# Patient Record
Sex: Male | Born: 1935
Health system: Southern US, Community
[De-identification: ages and names within clinical notes are randomized; demographics above are authoritative.]

## PROBLEM LIST (undated history)

## (undated) DIAGNOSIS — I2510 Atherosclerotic heart disease of native coronary artery without angina pectoris: Secondary | ICD-10-CM

## (undated) DIAGNOSIS — I350 Nonrheumatic aortic (valve) stenosis: Secondary | ICD-10-CM

## (undated) DIAGNOSIS — G459 Transient cerebral ischemic attack, unspecified: Secondary | ICD-10-CM

## (undated) DIAGNOSIS — I10 Essential (primary) hypertension: Secondary | ICD-10-CM

## (undated) DIAGNOSIS — C801 Malignant (primary) neoplasm, unspecified: Secondary | ICD-10-CM

## (undated) DIAGNOSIS — I6502 Occlusion and stenosis of left vertebral artery: Secondary | ICD-10-CM

## (undated) DIAGNOSIS — M48061 Spinal stenosis, lumbar region without neurogenic claudication: Secondary | ICD-10-CM

## (undated) DIAGNOSIS — I739 Peripheral vascular disease, unspecified: Secondary | ICD-10-CM

## (undated) DIAGNOSIS — I38 Endocarditis, valve unspecified: Secondary | ICD-10-CM

## (undated) DIAGNOSIS — E785 Hyperlipidemia, unspecified: Secondary | ICD-10-CM

## (undated) DIAGNOSIS — Z8679 Personal history of other diseases of the circulatory system: Secondary | ICD-10-CM

## (undated) DIAGNOSIS — M4646 Discitis, unspecified, lumbar region: Secondary | ICD-10-CM

## (undated) DIAGNOSIS — Z9079 Acquired absence of other genital organ(s): Secondary | ICD-10-CM

## (undated) DIAGNOSIS — B952 Enterococcus as the cause of diseases classified elsewhere: Secondary | ICD-10-CM

## (undated) DIAGNOSIS — I779 Disorder of arteries and arterioles, unspecified: Secondary | ICD-10-CM

## (undated) HISTORY — PX: CARDIAC CATHETERIZATION: SHX172

## (undated) HISTORY — DX: Nonrheumatic aortic (valve) stenosis: I35.0

## (undated) HISTORY — DX: Atherosclerotic heart disease of native coronary artery without angina pectoris: I25.10

## (undated) HISTORY — DX: Personal history of other diseases of the circulatory system: Z86.79

## (undated) HISTORY — DX: Hyperlipidemia, unspecified: E78.5

## (undated) HISTORY — DX: Peripheral vascular disease, unspecified: I73.9

## (undated) HISTORY — DX: Acquired absence of other genital organ(s): Z90.79

## (undated) HISTORY — DX: Essential (primary) hypertension: I10

---
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
## 1985-06-19 HISTORY — PX: CORONARY ARTERY BYPASS GRAFT: SHX141

## 1997-12-07 ENCOUNTER — Ambulatory Visit (HOSPITAL_COMMUNITY): Admission: RE | Admit: 1997-12-07 | Discharge: 1997-12-07 | Payer: Self-pay | Admitting: Urology

## 2002-02-05 ENCOUNTER — Encounter (INDEPENDENT_AMBULATORY_CARE_PROVIDER_SITE_OTHER): Payer: Self-pay | Admitting: *Deleted

## 2002-02-05 ENCOUNTER — Ambulatory Visit (HOSPITAL_COMMUNITY): Admission: RE | Admit: 2002-02-05 | Discharge: 2002-02-05 | Payer: Self-pay | Admitting: *Deleted

## 2003-04-23 ENCOUNTER — Emergency Department (HOSPITAL_COMMUNITY): Admission: EM | Admit: 2003-04-23 | Discharge: 2003-04-23 | Payer: Self-pay | Admitting: Emergency Medicine

## 2003-11-11 ENCOUNTER — Ambulatory Visit (HOSPITAL_COMMUNITY): Admission: RE | Admit: 2003-11-11 | Discharge: 2003-11-12 | Payer: Self-pay | Admitting: Cardiology

## 2004-05-03 ENCOUNTER — Ambulatory Visit (HOSPITAL_COMMUNITY): Admission: RE | Admit: 2004-05-03 | Discharge: 2004-05-03 | Payer: Self-pay | Admitting: Ophthalmology

## 2004-06-19 HISTORY — PX: CARDIAC VALVE REPLACEMENT: SHX585

## 2004-08-05 ENCOUNTER — Ambulatory Visit (HOSPITAL_COMMUNITY): Admission: RE | Admit: 2004-08-05 | Discharge: 2004-08-05 | Payer: Self-pay | Admitting: Cardiology

## 2004-08-15 ENCOUNTER — Inpatient Hospital Stay (HOSPITAL_COMMUNITY): Admission: RE | Admit: 2004-08-15 | Discharge: 2004-08-20 | Payer: Self-pay | Admitting: Cardiothoracic Surgery

## 2004-08-15 ENCOUNTER — Encounter (INDEPENDENT_AMBULATORY_CARE_PROVIDER_SITE_OTHER): Payer: Self-pay | Admitting: *Deleted

## 2004-09-12 ENCOUNTER — Encounter: Admission: RE | Admit: 2004-09-12 | Discharge: 2004-09-12 | Payer: Self-pay | Admitting: Cardiothoracic Surgery

## 2004-09-22 ENCOUNTER — Encounter (HOSPITAL_COMMUNITY): Admission: RE | Admit: 2004-09-22 | Discharge: 2004-12-21 | Payer: Self-pay | Admitting: Cardiology

## 2004-12-22 ENCOUNTER — Encounter (HOSPITAL_COMMUNITY): Admission: RE | Admit: 2004-12-22 | Discharge: 2005-01-17 | Payer: Self-pay | Admitting: Cardiology

## 2005-04-05 ENCOUNTER — Ambulatory Visit (HOSPITAL_COMMUNITY): Admission: RE | Admit: 2005-04-05 | Discharge: 2005-04-05 | Payer: Self-pay | Admitting: *Deleted

## 2005-04-05 ENCOUNTER — Encounter (INDEPENDENT_AMBULATORY_CARE_PROVIDER_SITE_OTHER): Payer: Self-pay | Admitting: *Deleted

## 2010-04-07 ENCOUNTER — Ambulatory Visit: Payer: Self-pay | Admitting: Cardiology

## 2010-11-04 NOTE — H&P (Signed)
NAMENAYEF, COLLEGE NO.:  [id number]   MEDICAL RECORD NO.:  [id number]          PATIENT TYPE:  OIB   LOCATION:  NA                           FACILITY:  [facility]   PHYSICIAN:  Guadelupe Sabin, M.D.DATE OF BIRTH:  1927/07/31   DATE OF ADMISSION:  05/03/2004  DATE OF DISCHARGE:                                HISTORY & PHYSICAL   REASON FOR ADMISSION:  This was a planned outpatient surgical admission of  this 77 year old white male admitted for cataract implant surgery of the  right eye.   PRESENT ILLNESS:  This patient has been followed in my office for routine  eye care since April 10, 1990 when he was referred by Dr. Ronny Flurry,  his cardiologist.  Initial examination revealed a refractive change and  acute posterior vitreous detachment.  Vision was corrected to 20/30 right  eye, 20/40 left eye.  The patient subsequently has had routine eye care with  refraction changes and minor injuries.  By 2001, the patient was noted to  have anterior subcapsular and nuclear cataract change, greater in the right  than left eye.  This has progressed, and by September 2005, vision was  recorded with best correction and/or refraction at 20/40 right eye, 20/25  left eye.  Due to the patient's complaint of increasing blur and glare, it  was elected to proceed with surgery at this time.   PAST MEDICAL HISTORY:  The patient is under the care of Dr. Ronny Flurry  for aortic stenosis, and has had a recent stent in March of 2005, and  started on Plavix.  He also is under the care of Dr. Vonita Moss, and has been  taking aspirin 81 mg, Lipitor, Lupron injections, multivitamin tablets, and  Plavix.   He has been approved by Dr. Vonita Moss for the proposed surgery under local  anesthesia.  Dr. Patty Sermons also stated that he was a fair surgical risk for  the procedure.  Dr. Patty Sermons noted the high-grade coronary artery disease,  aortic stenosis.   REVIEW OF SYSTEMS:  No current  cardiorespiratory complaints.   PHYSICAL EXAMINATION:  VITAL SIGNS:  As recorded on admission.  Blood  pressure 139/71, pulse 59, temperature 98.2, respirations 16.  GENERAL APPEARANCE:  The patient is a pleasant, well-nourished, well-  developed 77 year old white male in no acute distress.  HEENT:  Eyes - visual acuity as noted above.  Applanation tonometry normal;  16 mm right eye, 14 left eye.  Slit lamp examination - the eyes are white  and clear with a clear cornea, deep and clear anterior chamber.  Nuclear and  anterior subcapsular cataract changes present in the right eye, nuclear  cataract change in the left eye.  Dilated detailed fundus examination  reveals a clear vitreous behind the cataract haze with normal optic nerve,  blood vessels, and macula.  CHEST:  Coronary artery bypass surgical scar.  HEART:  Normal sinus rhythm.  The patient has a loud systolic murmur along  the left sternal border.  ABDOMEN:  Negative.  EXTREMITIES:  Negative.   ADMISSION DIAGNOSIS:  Senile cataract, both eyes.   SURGICAL  PLAN:  Cataract implant surgery right eye now, left eye later.       HNJ/MEDQ  D:  05/03/2004  T:  05/03/2004  Job:  [id number]

## 2010-11-04 NOTE — Cardiovascular Report (Signed)
NAME:  Randy Chen, Randy Chen                       ACCOUNT NO.:  [id number]   MEDICAL RECORD NO.:  [id number]                   PATIENT TYPE:  OIB   LOCATION:  6523                                 FACILITY:  [facility]   PHYSICIAN:  Colleen Can. Deborah Chalk, M.D.            DATE OF BIRTH:  04/23/1928   DATE OF PROCEDURE:  11/11/2003  DATE OF DISCHARGE:  11/12/2003                              CARDIAC CATHETERIZATION   HISTORY:  Mr. Casto has a known history of severe aortic stenosis.  He had  previous coronary artery bypass grafting.  He is now referred for  catheterization after having stress Cardiolite study which showed inferior  septal ischemia.  Noninvasive studies showed severe aortic stenosis.   PROCEDURE:  Right and left heart catheterization with selective coronary  angiography, left ventricular angiography, angiography of the saphenous vein  graft to the right coronary artery, saphenous vein graft to the left  circumflex, angiography of the left internal mammary artery to the LAD with  stent placement and angioplasty of the ostium of the left anterior  descending.   TYPE AND SITE OF ENTRY:  Percutaneous, right femoral artery.  Percutaneous  right femoral vein (for right heart catheterization).   CONTRAST MATERIAL:  Omnipaque.   MEDICATIONS GIVEN PRIOR TO PROCEDURE:  Valium 10 mg p.o.   MEDICATIONS GIVEN DURING THE PROCEDURE:  Versed 2 mg IV, Ancef 1 g IV,  Labetalol 10 mg x3 doses, sublingual nitroglycerin, heparin and integrilin.   COMMENTS:  The patient tolerated the procedure well.   HEMODYNAMIC DATA:  1. The right atrial pressure A wave of 6, V wave of 6, mean of 5.  2. RV is 30/8-10.  3. PA is 31/12-20.  4. Pulmonary capillary shows A wave of 21, V wave of 20, mean 18.  5. Aortic pressure 116/57.  6. LV 112/9-15.  7. Aortic valve gradient showed mean of 55.  The aortic saturation 94% , PA     saturation 63%.  8. Fick cardiac output was 3.3 with cardiac index of 2.1.  9.  Thermodilution cardiac output was 4.5 with cardiac index of 2.2.     Calculated aortic valve area was 0.86 sq cm.   ANGIOGRAPHIC DATA:  1. The left main coronary artery was essentially normal.  2. The ostium of the left anterior descending had 95% focal stenosis.  There     was diffuse disease with calcification involving the proximal left     anterior descending.  There were at least 2-3 septal perforating branches     there.  There were two small diagonal vessels, both with 70-80% stenosis,     then there was diffuse disease in the mid left anterior descending.     There was competitive flow distally beyond severe stenosis in the left     anterior descending.  3. Left internal mammary graft to the LAD is widely patent with nice     insertion and good  distal run off.  4. Left circumflex totally occluded proximally.  5. Saphenous vein graft in a sequential manner to the obtuse marginal one,     obtuse marginal two and obtuse marginal three was widely patent with nice     insertion and good distal run off.  6. Right coronary artery:  The right coronary artery was totally occluded     proximally.  7. Saphenous vein graft to the right coronary artery is widely patent with     nice insertion and good distal run off.   LEFT VENTRICULAR ANGIOGRAM:  Left ventricular angiogram was performed in  RAO position.  Overall cardiac size and silhouette are normal.  Global  ejection fraction is estimated to be 65-70%.  There is no mitral  regurgitation noted.  Aortic valve was severely calcified.   PERCUTANEOUS INTERVENTION:  Using a JL-4 guide, we had satisfactory backup.  Hi-Torque Floppy guide wire was passed across the stenosis.  We dilated  initially with a 2.5 x 15-mm Maverick balloon.  We then returned with a 2.5  x 13-mm Cypher stent and positioned and inflated to maximum of 15  atmospheres.  Final angiographic result was felt to be quite satisfactory.  There was some indentation of the stent  superiorly, but this was a region of  calcification, but we had full luminal expansion of the stent and it is felt  to be satisfactory overall.  There is satisfactory outflow although the  distal vessel to the stent was narrowed.  However, outflow was felt to be  excellent and felt to be very satisfactory result.   OVERALL IMPRESSION:  1. Severe aortic stenosis.  2. Severe ostial stenosis in the left anterior descending with compromise of     septal perforating branches proximally with successful stent placement.  3. Patent saphenous vein graft to the right coronary artery, patent left     internal mammary artery graft to the left anterior descending and patent     saphenous vein graft in sequential manner to the left circumflex     distribution.  4. Normal left ventricular function.   PLAN:  The patient will be treated conservatively for the next four to six  months.  He would be a candidate for aortic valve replacement at that point  in time.  It is hopeful that this will address the ischemic area as  demonstrated by stress Cardiolite study.                                               Colleen Can. Deborah Chalk, M.D.    SNT/MEDQ  D:  11/11/2003  T:  11/12/2003  Job:  [id number]   cc:   Cassell Clement, M.D.  [address], [address]  [place]  [place] [address]  Fax: [phone]

## 2010-11-04 NOTE — Discharge Summary (Signed)
NAMEKENZEL, Randy Chen NO.:  [id number]   MEDICAL RECORD NO.:  [id number]          PATIENT TYPE:  INP   LOCATION:  2006                         FACILITY:  [facility]   PHYSICIAN:  Sheliah Plane, MD    DATE OF BIRTH:  05-Jan-1928   DATE OF ADMISSION:  08/15/2004  DATE OF DISCHARGE:  08/19/2004                                 DISCHARGE SUMMARY   ADMISSION DIAGNOSIS:  Aortic stenosis.   DISCHARGE DIAGNOSIS:  1.  History of atherosclerotic coronary artery disease with preserved left      ventricular function status post coronary artery bypass grafting x 5 in      1987 and status post stent placement in 2005.  2.  History of prostate cancer currently maintained on Lupron with a low      PSA.  3.  Status post prostatectomy in 1992 by Dr. Larey Dresser.  4.  History of hematuria.  5.  History of colonic polyps by colonoscopy performed in 2003.  6.  Gastroesophageal reflux.  7.  Aortic stenosis status post aortic valve replacement with a pericardial      tissue valve 23 mm.  8.  Postoperative anemia, stable.  9.  Postoperative atrial fibrillation, resolved on Amiodarone and Lopressor.   ALLERGIES:  No known drug allergies.   BRIEF HISTORY:  The patient is a 77 year old Caucasian male with known  coronary occlusive disease status post CABG in May 1987 and stent placement  in the ostium of the LAD in May 2005 secondary to a jeopardized septal  perforator.  Over the past year, the past year, the patient has noted  occasional symptoms of occasional light headedness, episodes of mild chest  discomfort, and dyspnea on exertion.  The patient denied frank syncope.  He  had been followed by echocardiogram over the past 4-5 years.  The most  recent echo was performed in January 2006 and this showed an increase in the  peak aortic valve gradient.  It also revealed mild mitral regurgitation and  mild tricuspid regurgitation as well as pulmonary  hypertension.  Due to the  increase in the patient's symptoms, he was referred to Dr. Sheliah Plane  regarding aortic valve replacement.  Dr. Tyrone Sage evaluated the patient and  it was his opinion that he should proceed with an aortic valve replacement  with a tissue valve.   HOSPITAL COURSE:  The patient was admitted and taken to the OR on August 15, 2004, for a redo sternotomy and aortic valve replacement for the  pericardial tissue valve, 23 mm.  The patient tolerated the procedure well  and was hemodynamically stable immediately postoperatively.  The patient was  transferred from the OR to the SICU in stable condition.  The patient was  extubated without complications and woke up from anesthesia neurologically  intact.   On postoperative day one, the patient was without complaint.  He was  maintaining a normal sinus rhythm and his vital signs were stable.  Chest x-  ray revealed a 15% left stable pneumothorax.  The patient's drips had been  weaned accordingly.  Chest tubes and vasodilators  were continued in a  routine manner without complications.  Initially, the patient was going to  be restarted on his Plavix and aspirin for the pericardial tissue valve and  stent.  This was then held because of a postoperative thrombocytopenia.  This was stable at that time.  In the evening of postoperative day one, the  patient was noted to have a dark colored urine.  The patient does have a  history of hematuria.  Urine culture and urinalysis were checked and these  were negative.  The patient was also noted to have a postoperative anemia on  postop day two with hemoglobin 9.5 and hematocrit 27.3.  The patient was  started on iron and folic acid.  He was also noted to have a short episode  of supraventricular tachycardia with a rate up to 160s after his walk.  Upon  resting, his heart rate came back down to 110 to 115 and he was given his  morning dose of beta blocker.  The patient returned to her sinus rhythm and   stabilized.  The patient began cardiac rehab on postoperative day one and  had tolerated this quite well throughout the postoperative course.   On the evening of postoperative day two, he was noted to go into atrial  fibrillation.  The patient was started on IV Amiodarone and his beta blocker  dose was increased.  The patient subsequently converted to a normal sinus  rhythm and was switched to  p.o. Amiodarone.  The patient has continued to  maintain a normal sinus rhythm throughout the remainder of the postoperative  course.  He will be discharged on Amiodarone and Toprol.  Secondary to the  patient's thrombocytopenia and his atrial fibrillation, the patient was  started on Coumadin.  He will be maintained on this for approximately two  months pending that he has no further episodes of atrial fibrillation.  The  patient has been volume overloaded postoperatively and has been diuresed  accordingly.  There were no further episodes of hematuria noted throughout  the postoperative course.   On postoperative day four, the patient is without complaint.  He is afebrile  with stable vital signs and  maintaining a normal sinus rhythm. On physical  exam, cardiac is regular rate and rhythm, there are faint crackles present  in the bilateral bases of the lungs.  The abdomen is benign.  The incision  is clean, dry, and intact.  There is no edema present in the bilateral lower  extremities.  The patient is maintaining  normal sinus rhythm on p.o.  Amiodarone, Coumadin, and Lopressor.  He will need to continue diuresis for  a short period of time after discharge.  He will also be continued on iron  and folic acid for his postoperative anemia.  The postoperative  thrombocytopenia is stable at this time.  As long as the patient continues  to progress in the current manner, he should be stable for discharge in the next 1-2 days pending morning round rehabilitation.   LABORATORY DATA:  CBC on August 19, 2004, showed white count 7.9, hemoglobin  8.9, hematocrit 25.4, platelets 138.  BNP on August 19, 2004, sodium 141,  potassium 4.3, BUN 13, creatinine 1.1, glucose 115, INR 1.2 on August 19, 2004.  Urine culture was negative.   CONDITION ON DISCHARGE:  Improved.   DISCHARGE MEDICATIONS:  Aspirin 81 mg p.o. daily, Toprol XL 50 mg daily,  Altace 5 mg daily, folic acid 1 mg daily,  Niferex 150 mg daily, Amiodarone  200 mg b.i.d. then as directed, Coumadin 2.5 mg daily, and as directed,  Lasix 40 mg daily x five additional days, K-Dur 20 mEq daily x 5 additional  days, Tylox 1-2 p.o. q.4-6h. p.r.n. pain.   DISCHARGE INSTRUCTIONS:  Activities:  No driving and no lifting more than 10  pounds and the patient is to continue daily breathing and walking exercises.  Diet:  Low salt, low fat.  Wound care:  The patient may shower daily and  clean the incisions with soap and water.  The patient is instructed to call  the office if wound problems arise.  Follow up:  PT and INR blood work on  August 22, 2004, by Dr. Yevonne Pax office.  The patient will be instructed to  contact his office for an appointment.  The patient should contact Dr.  Yevonne Pax office for an  appointment two weeks after discharge.  Dr. Tyrone Sage September 15, 2004, at  12:10 p.m.  The patient should go to [facility] on September 15, 2004, at 11:10 a.m. for a chest x-ray which he will bring with him to  the appointment with Dr. Tyrone Sage.      AY/MEDQ  D:  08/19/2004  T:  08/19/2004  Job:  [id number]   cc:   Cassell Clement, M.D.  [address]  [place]  [place] [address]  Fax: [phone]

## 2010-11-04 NOTE — H&P (Signed)
NAME:  Randy Chen, Randy Chen                       ACCOUNT NO.:  [id number]   MEDICAL RECORD NO.:  [id number]                   PATIENT TYPE:  OIB   LOCATION:                                       FACILITY:  [facility]   PHYSICIAN:  Colleen Can. Deborah Chalk, M.D.            DATE OF BIRTH:  May 17, 1928   DATE OF ADMISSION:  11/11/2003  DATE OF DISCHARGE:                                HISTORY & PHYSICAL   CHIEF COMPLAINT:  None.   HISTORY OF PRESENT ILLNESS:  Randy Chen is a pleasant 77 year old male who  was referred to our office for cardiac catheterization.  He sees Dr. Cassell Clement on a routine basis.  He was referred for his routine stress  Cardiolite study, earlier this month.  Clinically, he has been doing well.  He has had no complaints of chest pain or shortness of breath.  He has had  no lightheadedness, dizziness, or syncope.  He underwent a stress Cardiolite  study on Nov 04, 2003.  He walked seven minutes and 15 seconds on the  standard Bruce protocol.  He had no chest pain or shortness of breath.  His  EKG showed marked ST depression in the inferior lateral leads.  There was  possible septal ischemia with mildly depressed LV dysfunction with septal  hypokinesis.  Invasive study is now recommended.   PAST MEDICAL HISTORY:  1. Atherosclerotic cardiovascular disease.  He has had previous coronary     artery bypass grafting that dates back to 1987.  He had left internal     mammary at that time placed to the LAD, saphenous vein grafts were placed     to the distal right coronary, to the first, second, and third OM branches     of the circumflex.  2. Known aortic stenosis with last 2D echocardiogram, in January 2005, with     this he demonstrated mild LVH, normal LV systolic function, calcified     aortic stenosis with severe aortic stenosis and mild aortic     insufficiency.  He has had no significant change in his peak systolic     gradient or peak velocity.  3. History of  prostatectomy.  4. Seasonal allergies.  5. Hyperlipidemia.   ALLERGIES:  None.   CURRENT MEDICINES:  1. Lipitor 40 every day.  2. Baby aspirin daily.  3. Lupron every four months.  4. Tums twice a day.  5. Folic acid daily.  6. Multivitamin daily.  7. Allegra p.r.n.   FAMILY HISTORY:  His brother has had previous prostate cancer.  Both of his  parents are deceased.  Father died at 28 with a history of heart disease.  Mother died at 45 with heart disease and heart failure.   SOCIAL HISTORY:  He previously was employed in [employer], clinical.  He is married.  He has no tobacco products.  He does have three children.   REVIEW OF SYSTEMS:  As noted above and is otherwise unremarkable.   PHYSICAL EXAMINATION:  GENERAL:  He is a pleasant male in no acute distress.  VITAL SIGNS:  Weight is 194, blood pressure 150/90 sitting, 140/80 standing,  heart rate 64 and regular, respirations 18.  He is afebrile.  SKIN:  Warm and dry.  Color is unremarkable.  LUNGS:  Are both clear.  HEART:  Shows a grade 3/6 harsh systolic murmur.  ABDOMEN:  Soft.  Positive bowel sounds.  EXTREMITIES:  Without edema.   PERTINENT LABS:  Pending.   OVERALL IMPRESSION:  1. Abnormal stress Cardiolite study.  2. Known ischemic heart disease with previous coronary artery bypass     grafting dating back to 66.  3. Known aortic stenosis.   PLAN:  We will proceed on with left and right heart catheterization.  The  procedure has been reviewed in full detail including the risks and benefits,  and he is willing to proceed on Wednesday, Nov 11, 2003.      Sharlee Blew, N.P.                     Colleen Can. Deborah Chalk, M.D.    LC/MEDQ  D:  11/09/2003  T:  11/10/2003  Job:  [id number]   cc:   Cassell Clement, M.D.  [address]  [place]  [place] [address]  Fax: [phone]   Sharlet Salina, M.D.  [address]  [place]  [place] [address]  Fax: [phone]

## 2010-11-04 NOTE — Op Note (Signed)
NAMERAMOND, DARNELL NO.:  [id number]   MEDICAL RECORD NO.:  [id number]          PATIENT TYPE:  OIB   LOCATION:  2304                         FACILITY:  [facility]   PHYSICIAN:  Sheliah Plane, MD    DATE OF BIRTH:  [birth year]   DATE OF PROCEDURE:  08/16/2004  DATE OF DISCHARGE:                                 OPERATIVE REPORT   PREOPERATIVE DIAGNOSIS:  Critical aortic stenosis status post coronary  artery bypass grafting.   POSTOPERATIVE DIAGNOSIS:  Critical aortic stenosis status post coronary  artery bypass graft.   PROCEDURE:  Redo sternotomy with aortic valve replacement with a pericardial  tissue valve, model 3000, [employer], 23 mm.   SURGEON:  Gwenith Daily. Tyrone Sage, M.D.   FIRST ASSISTANT:  Evelene Croon, M.D.   ANESTHESIA:  General.   INDICATIONS FOR PROCEDURE:  The patient is a 77 year old male who had known  coronary occlusive disease, having undergone coronary artery bypass grafting  x5 in 1988. At that time, he had a triple, sequential reverse saphenous vein  graft to a lst, 2nd and 3rd obtuse marginal, left anterior descending  coronary artery to the left anterior descending artery and reverse saphenous  vein graft to the distal right coronary artery.   The patient had done well until the last several years when he was noted to  have progressive aortic stenosis to the point where he now has over a 100 mm  peak gradient.  Cardiac catheterization was performed in the past several  weeks that confirmed patency of his grafts without evidence of disease, and  severe native coronary artery disease, and critical aortic stenosis.  Because of the patient's symptoms of vague chest discomfort and increasing  fatigue, redo surgery with aortic valve replacement was recommended to the  patient, who agreed to proceed with aortic valve replacement and signed  informed consent.   DESCRIPTION OF PROCEDURE:  With Swan-Ganz, arterial lines and monitors  in  place, the patient underwent general endotracheal anesthesia without  incident.  The skin of the chest and leg was prepped with Betadine, draped  in the usual sterile manner.  Using a sagittal saw, median sternotomy was  performed. Underlying tissue was dissected off the right graft, coursing  anteriorly, but was aborted with the sagittal saw and was not injured.  Ascending aorta and the right atrium were dissected out to allow  cannulation.  In addition,  previously, the mammary artery had been put  through a hole in the pericardium, and dissected along the pulmonary artery  and anterior right ventricle, the mammary artery was identified to the point  where it could be occluded while cross-clamp was in place.  The patient was  then systemically heparinized.  Ascending aorta and right atrium were  cannulated.   Because of a moderate amount of aortic insufficiency, retrograde  cardioplegic catheter was also placed.  Aortic root vent cardioplegia then  was introduced. The patient was placed on cardiopulmonary bypass, 2.4  L/minute sq/m.   A transverse aortotomy was performed, below the take-off of the right  coronary graft transversely. This gave good exposure of  a tricuspid,  severely calcified aortic valve. The valve was excised, the annulus was  debrided, carefully taking care to remove all loose, calcific debris.  The  annulus was then sized for a pericardial tissue valve, model 3000 [employer] 23 mm [place], Serial [id number].  Pledgeted stitches were placed  circumferentially in the annulus with the pledgets on the ventricular  surface and used to secure the valve in place, which seated well.   Aortotomy was then closed with running 3-0 Prolene suture over felt strips.  The aortotomy was very close to the top of the strut, especially between the  left and the right coronary cusp, but care was taken to approximate the  aorta well in this area. Patient was re-warmed, the  aortic cross-clamp was  removed, with total cross-clamp time of 90 minutes.  A 16 gauge needle was introduced in the left ventricular apex, and further  de-aired the heart. With the patient in sinus rhythm, transesophageal echo  showed good seating of the valve and valve leaflet closure, without evidence  of aortic insufficiency.   With the body temperature rewarmed to 37 degrees, the right superior  pulmonary vein vent was removed.  Atrial and ventricular pacing wires had  been applied. The patient was then ventilated and weaned from  cardiopulmonary bypass without difficulty.  He remained hemodynamically  stable.  He was decannulated in the usual fashion.  Protamine sulfate was  administered.  Total pump time was 120 minutes, with a total cross-clamp  time of 90 minutes.   The remnants of the pericardium were reapproximated over the ascending  aorta. Two mediastinal tubes were left in place. Sternum was closed with #6  stainless steel wires. Fascia was closed with interrupted  0-Vicryl, running 3-0 Vicryl and subcutaneous tissue, 4-0 subcuticular  stitch in the skin edges. Dry dressings were applied. Sponge and needle  counts were reported as correct at the completion of the procedure.  The  patient tolerated the procedure without obvious complication and was  transferred to surgical intensive care unit for further postoperative care.      EG/MEDQ  D:  08/16/2004  T:  08/16/2004  Job:  [id number]   cc:   Cassell Clement, M.D.  [address]  [place]  [place] [address]  Fax: [phone]

## 2010-11-04 NOTE — H&P (Signed)
NAMEHOWELL, GROESBECK NO.:  [id number]   MEDICAL RECORD NO.:  [id number]          PATIENT TYPE:  OIB   LOCATION:                               FACILITY:  [facility]   PHYSICIAN:  Colleen Can. Deborah Chalk, M.D.DATE OF BIRTH:  18-Nov-1927   DATE OF ADMISSION:  08/05/2004  DATE OF DISCHARGE:                                HISTORY & PHYSICAL   CHIEF COMPLAINT:  Dizziness, mild shortness of breath, as well as mild chest  pain.   HISTORY OF PRESENT ILLNESS:  Randy Chen is a very pleasant 77 year old  white male who has multiple medical problems.  He has a known history of  aortic stenosis as well as coronary disease.  He has had coronary artery  bypass grafting 19 years ago in 1987.  He had a catheterization as well as  stent placement and angioplasty to the ostium of the left anterior  descending in May 2005.  He has now begun to experience occasional light-  headedness when he bends over.  He has also had one unusual episode of  shortness of breath and has had some mild chest discomfort seemingly more  with exertion.  He has had no frank syncope.  He has been noted to have  progressive aortic stenosis with severe calcific aortic stenosis with mild  aortic insufficiency.  There is mild mitral regurgitation, mild tricuspid  regurgitation, and mild pulmonary hypertension.  His most recent  echocardiogram has been performed in January 2006, and the peak aortic valve  gradient has now increased from 84 to 117, and the peak velocity has  increased from 4.6 to 5.4 since January 2005.  He is now referred for repeat  left and right heart catheterization in order to proceed on with probable  aortic valve replacement.   PAST MEDICAL HISTORY:  1.  Atherosclerotic cardiovascular disease.  He has had previous history of      coronary artery bypass grafting that dates back to 1987, which was      performed by Dr. Amie Critchley at [facility].  His last      catheterization  occurred in May 2005, which showed severe aortic      stenosis, severe ostial stenosis in the LAD, with compromise of the      septal perforating branches proximally, with subsequent successful stent      placement with a 2.5 x 13 mm Cypher stent.  The vein graft to the right      coronary artery was patent as well as the left internal mammary to the      LAD, as well as the saphenous vein graft in a sequential manner to the      left circumflex.  There was normal LV function.  2.  Chronic Plavix use.  3.  History of prostate cancer.  He is maintained on Lupron.  4.  History of hematuria.  5.  History of colonic polyps.  He has had colonoscopy dating back to 2003.  6.  Gastroesophageal reflux disease.  7.  Prior history of prostate surgery.  8.  History of erectile dysfunction.  9.  Remote history of transfusion.   ALLERGIES:  None.   CURRENT MEDICINES:  1.  Lipitor 40 daily.  2.  Baby aspirin every other day.  3.  Lupron q.4 months.  4.  Tums and calcium daily.  5.  Folic acid daily.  6.  Multivitamin daily.  7.  Plavix 75 mg daily.   FAMILY HISTORY:  Parents are deceased.  Father died at 8 with heart  disease.  Mother died at 21 with heart failure.   SOCIAL HISTORY:  He is married.  He has no current alcohol or tobacco.   REVIEW OF SYSTEMS:  As noted above.   EXAMINATION:  GENERAL:  He is a very pleasant elderly white male in no acute  distress.  BLOOD PRESSURE:  150/80 sitting and 160/80 standing.  HEART RATE:  64.  WEIGHT:  193 pounds.  SKIN:  Warm and dry.  Color is unremarkable.  LUNGS:  Basically clear.  HEART:  Shows a harsh grade 3/6 outflow murmur.  ABDOMEN:  Soft.  Positive bowel sounds.  Nontender.  EXTREMITIES:  Without edema.  NEUROLOGIC:  Intact.  There are no gross focal deficits.   PERTINENT LABORATORIES:  Pending.   OVERALL IMPRESSION:  1.  Progressive aortic stenosis.  2.  History of known ischemic heart disease.  3.  Remote history of bypass  grafting dating back to 1987, with recent      catheterization in May 2005.  4.  Hyperlipidemia.  5.  History of prostate cancer.  6.  History of reflux disease.   PLAN:  Will proceed on with repeat left and right heart catheterization.  The procedure has been reviewed in full detail with both him and his wife,  including the risks and benefits, and he is willing to proceed on Friday  August 05, 2004.      ________________________________________  Sharlee Blew, N.P.  ___________________________________________  Colleen Can. Deborah Chalk, M.D.    LC/MEDQ  D:  08/04/2004  T:  08/04/2004  Job:  [id number]   cc:   Cassell Clement, M.D.  [address]  [place]  [place] [address]  Fax: [phone]   Colleen Can. Deborah Chalk, M.D.  Fax: [phone]

## 2010-11-04 NOTE — Op Note (Signed)
NAMEANGELICA, Randy Chen             ACCOUNT NO.:  [id number]   MEDICAL RECORD NO.:  [id number]          PATIENT TYPE:  OIB   LOCATION:  2304                         FACILITY:  [facility]   PHYSICIAN:  Guadalupe Maple, M.D.  DATE OF BIRTH:  04-13-1928   DATE OF PROCEDURE:  08/15/2004  DATE OF DISCHARGE:                                 OPERATIVE REPORT   PROCEDURE:  Intraoperative transesophageal echocardiography.   INDICATIONS:  Mr. Randy Chen is a 77 year old white male with a history  of aortic stenosis and previous coronary artery bypass grafting in 1987 who  is now scheduled to undergo aortic valve replacement by Dr. Tyrone Sage.  Intraoperative transesophageal echocardiography was requested to evaluate  the aortic valve and to determine if any other valvular pathology was  present and to assess the left ventricular function.   DESCRIPTION OF PROCEDURE:  The patient was brought to the operating room at  [facility].  General anesthesia was induced without difficulty.  The trachea was intubated without difficulty.  The transesophageal  echocardiography probe was then inserted in the esophagus without  difficulty.   IMPRESSION:   PREBYPASS FINDINGS:  1.  Severe aortic stenosis.  The aortic valve appeared to be trileaflet, but      all three leaflets were heavily calcified, and the aortic anulus      appeared to be heavily calcified as well.  There was severely restricted      opening of the aortic valve which could not be plainly imaged.  There      was 1 to 2+ aortic insufficiency noted as well.  Continuous wave Doppler      interrogation of the aortic outflow tract revealed a peak aortic      velocity of 5.3 meters per second which translated to a peak gradient of      112 mmHg.  The aortic annulus measured 2.5 cm in diameter.  The aortic      root at the site of the tubular ridge measured 2.5 cm as well.  There      was mild to moderate calcification of the proximal  ascending aorta.  2.  Mitral valve.  The mitral valve leaflets were mildly thickened, and      there was trace mitral insufficiency.  There was no prolapse or      fluttering of the mitral valve.  3.  Left ventricle.  There was moderate left ventricular hypertrophy.  The      left ventricular wall thickness measured 1.53 cm in thickness of the      anterior wall at the mid cavitary level end diastole.  The hypertrophy      was concentric.  There was good contractility in all segments of the      left ventricle that were interrogated.  The ejection fraction measured      55 to 60%.  There was no thrombus noted in the left ventricular apex.  4.  Tricuspid valve.  The tricuspid valve structure appeared normal, and      there was trace tricuspid insufficiency.  5.  Right ventricle.  The right ventricular free wall appeared to contract      well.  The right ventricular cavity appeared mildly dilated but      otherwise appeared normal.  6.  The inner atrial septum was intact without evidence of patent foramen      ovale or atrial septal defect.  7.  The left atrial size appeared to be within normal limits.  There was no      thrombus noted in the left atrium or left atrial appendage.  8.  The descending thoracic aorta showed mild atheromatous disease but did      not appear aneurysmal.   POST BYPASS FINDINGS:  1.  There was a bioprosthesis in the aortic valve position.  The leaflets of      the prosthesis opened normally, and there was no aortic insufficiency      noted.  The valve appeared well seated.  2.  The mitral valve appeared unchanged from the PREBYPASS study.  There was      trace mitral insufficiency,      mild thickening of the mitral leaflets, but no prolapse or fluttering of      the mitral valve.  3.  Left ventricular function showed dyssynchrony of the septal contraction      consistent with ventricular pacing but good contractility of all      segments of the left  ventricle.  Again, the ejection fraction was      estimated at 55%.      DCJ/MEDQ  D:  08/15/2004  T:  08/15/2004  Job:  [id number]

## 2010-11-04 NOTE — Op Note (Signed)
Randy Chen, Randy Chen             ACCOUNT NO.:  [id number]   MEDICAL RECORD NO.:  [id number]          PATIENT TYPE:  OIB   LOCATION:  NA                           FACILITY:  [facility]   PHYSICIAN:  Guadelupe Sabin, M.D.DATE OF BIRTH:  [birth year]   DATE OF PROCEDURE:  05/03/2004  DATE OF DISCHARGE:                                 OPERATIVE REPORT   PREOPERATIVE DIAGNOSIS:  Senile nuclear cataract, right eye.   POSTOPERATIVE DIAGNOSIS:  Senile nuclear cataract, right eye.   NAME OF OPERATION:  Planned extracapsular cataract extraction,  phacoemulsification, primary insertion of posterior chamber intraocular lens  implant.   SURGEON:  Guadelupe Sabin, M.D.   ASSISTANT:  Nurse.   ANESTHESIA:  Local 4% Xylocaine, 0.75% Marcaine.  Anesthesia standby  required in this cardiac patient.  The patient was given fentanyl and Versed  instead of the regular Pentothal intravenously because of his precarious  cardiac status.   OPERATIVE PROCEDURE:  After the patient was prepped and draped, a lid  speculum was inserted in the right eye.  The eye was turned downward and a  superior rectus traction suture placed.  Schiotz tonometry was recorded at 5-  7 scale units with a 5.5 g weight.  A peritomy was performed adjacent to the  limbus from the 11 to 1 o'clock position.  The corneoscleral junction was  cleaned and a corneoscleral groove made with a 45 degree Superblade.  The  anterior chamber was then entered with the 2.5 mm diamond keratome at the 12  o'clock position and a 15 degree blade at the 2:30 position.  Using a bent  26-gauge needle on an Occucoat syringe, a circular capsulorhexis was begun  and then completed with the Grabow forceps.  Hydrodissection and  hydrodelineation were performed using 1% Xylocaine.  The 30 degree  phacoemulsification tip was then inserted with slow emulsification of the  lens  nucleus with back-cracking and fragmentation with a Bechert pick.  Total ultrasonic  time 52 seconds, average power level 15%.  Total amount of  fluid used 80 mL.  Following removal of the nucleus, the residual cortex was  aspirated with the irrigation-aspiration tip.  The posterior capsule  appeared intact with a brilliant red fundus reflex.  It was therefore  elected to insert an [employer] [id number] three-piece silicone  posterior chamber implant, diopter strength +18.00.  This was inserted with  the McDonald forceps into the anterior chamber and then centered into the  capsular bag using the [facility] lens rotator. The lens appeared to be well-  centered, and the Occucoat and Provisc which had been used intermittently  during the procedure were aspirated and replaced with balanced salt solution  and Miochol ophthalmic solution.  The operative incisions appeared to be  self-sealing.  It was, however, elected to place a single 10-0 interrupted  radial suture across the 12 o'clock incision to ensure closure and to  prevent  endophthalmitis.  Maxitrol ointment was instilled in the conjunctival cul-de-  sac and a light patch and protective shield applied.  The duration of  procedure and anesthesia administration 45 minutes.  The patient tolerated  the procedure well in general, left the operating room for the recovery room  in good condition.       HNJ/MEDQ  D:  05/03/2004  T:  05/03/2004  Job:  [id number]

## 2010-11-04 NOTE — Cardiovascular Report (Signed)
NAMEVASHAWN, EKSTEIN NO.:  [id number]   MEDICAL RECORD NO.:  [id number]          PATIENT TYPE:  OIB   LOCATION:  2859                         FACILITY:  [facility]   PHYSICIAN:  Colleen Can. Deborah Chalk, M.D.DATE OF BIRTH:  12-25-1927   DATE OF PROCEDURE:  08/05/2004  DATE OF DISCHARGE:  08/05/2004                              CARDIAC CATHETERIZATION   PROCEDURE:  1.  Left heart catheterization.  2.  Selective coronary angiography.  3.  Left ventricular angiography.  4.  Aortic root angiography.  5.  Angiography of the saphenous vein graft x2.  6.  Angiography of left internal mammary artery.  7.  Right heart catheterization.  (No left ventricular angiogram done, although left heart catheterization  performed)   TYPE AND SITE OF ENTRY:  Percutaneous right femoral artery, percutaneous  right femoral vein with Angioseal of the right femoral artery.   CATHETERS:  1.  6-French four-curved Judkins right and left coronary catheters  2.  6-French pigtail ventriculographic catheter.  3.  AL-2 Amplatz catheter used to cross the aortic valve.  Pigtail catheter      pullback attempted to crossing of the aortic valve.   CONTRAST MATERIAL:  Omnipaque.   MEDICATIONS PRIOR TO PROCEDURE:  Valium 10 mg p.o.   MEDICATIONS DURING PROCEDURE:  Versed 2 mg IV.   COMMENTS:  The patient tolerated the procedure well.   HEMODYNAMIC DATA:  1.  Right pressure:      1.  A Wave:  9      2.  V wave: 7 with mean of 5.      3.  RV 30/1.7.  2.  Pulmonary artery pressure:  27/10.  3.  Pulmonary capillary wedge pressure:      1.  A: 13      2.  V: 9      3.  Mean: 8.  4.  Aortic pressure:  136/66.  5.  Left ventricular:  214/11-13.  6.  Aortic valve gradient:  78 mm peak-to-peak, and 64 mm mean.  Aortic      valve 0.6 cm sq.  7.  Cardiac output:  (FICK)  4.0 with cardiac index of 2.0.  8.  Cardiac output:  (Thermodilution)  4.3 with cardiac index of 2.15.   ANGIOGRAPHIC DATA:  The  aortic root demonstrated mild aortic insufficiency,  that would be 1-2+.   LEFT VENTRICULOGRAPHY:  The left ventricular angiogram was not performed;  however, we could see the silhouette of the left ventricle with the aortic  insufficiency.  It is felt that there was well preserved global left  ventricular function.  By 2-D echocardiogram, the left ventricular function  was normal.   CORONARY ARTERIES:  1.  RIGHT CORONARY ARTERY:  Totally occluded.  The saphenous vein graft to      the distal right coronary artery was widely patent, with nice insertion      and good distal runoff.  2.  LEFT MAIN CORONARY ARTERY:  Normal.  3.  LEFT ANTERIOR DESCENDING ARTERY:  Had a stent in the proximal portions,      that extended  across the left main distally.  It was patent, with less      than 30 narrowing.  There were two diagonal vessels. The first diagonal      vessel had a 50% narrowing and the second diagonal a 70% narrowing; they      were of moderate size.  The left anterior descending had bidirectional      flow distally from the left internal mammary artery graft.  There was      diffuse disease in the proximal left anterior descending.  The left      internal mammary artery to the LAD is widely patent, with nice insertion      and good distal runoff.  4.  LEFT CIRCUMFLEX:  Totally occluded proximally.  The saphenous vein      grafts to the obtuse marginal one, obtuse marginal two and obtuse      marginal three; sequential manner was widely patent with nice insertion      and good distal runoff.   OVERALL IMPRESSION:  1.  Severe aortic stenosis, with mild aortic insufficiency.  2.  Totally occluded right coronary artery and left circumflex.  3.  Patent stent in the proximal left anterior descending, with a 50%      narrowing in diagonal vessel one and 70% narrowing in a small diagonal      vessel two; with a patent left internal mammary graft to the distal left      anterior descending  artery.  4.  Patent saphenous vein graft to the right coronary artery and obtuse      marginal one, obtuse marginal two and obtuse marginal three      sequentially.   PLAN:  The patient will undergo aortic valve replacement.      SNT/MEDQ  D:  08/05/2004  T:  08/06/2004  Job:  [id number]

## 2010-11-04 NOTE — Discharge Summary (Signed)
NAME:  Randy Chen, Randy Chen                       ACCOUNT NO.:  [id number]   MEDICAL RECORD NO.:  [id number]                   PATIENT TYPE:  OIB   LOCATION:  6523                                 FACILITY:  [facility]   PHYSICIAN:  Colleen Can. Deborah Chalk, M.D.            DATE OF BIRTH:  June 19, 1928   DATE OF ADMISSION:  11/11/2003  DATE OF DISCHARGE:  11/12/2003                                 DISCHARGE SUMMARY   PRIMARY DISCHARGE DIAGNOSIS:  Abnormal Cardiolite study with subsequent  elective cardiac catheterization, with stent placement (2.5 x 13 millimeter  CYPHER stent) to the ostial left anterior descending.   Other findings are as follows:  1. Normal left ventricular function.  2. Left internal mammary to the left anterior descending was patent.  The     left anterior descending had an ostial 95% narrowing.  There was a 70%     narrowing in the first diagonal; it was 100% occluded after the second     septal perforating branch.  The left circumflex and right coronary     arteries were 100% occluded.  Saphenous vein graft to obtuse marginal-1,     2 and 3 were unremarkable.  Saphenous vein graft to the right coronary     was unremarkable as well.   DISCHARGE DIAGNOSES:  1. Known atherosclerotic cardiovascular disease with previous bypass     grafting that dates back to 1987.  2. Known aortic stenosis, now with right heart catheterization showing     aortic valve area of 0.86.  3. Hyperlipidemia.   HISTORY OF PRESENT ILLNESS:  The patient is a very pleasant 77 year old male  who was referred to Dr. Roger Shelter for elective left and right heart  catheterization.  He has had a recent abnormal Cardiolite study.  Clinically  he has done well without complaints.   Please seen the dictated history and physical for the patient's presentation  and profile.   LABORATORY DATA:  Laboratory data on admission:  CBC was normal, except for  hemoglobin low at 11.9 and hematocrit was 34.8.   Glucose was 108.  Chemistries were otherwise unremarkable.   HOSPITAL COURSE:  The patient was admitted electively in order to undergo  left and right heart catheterization.  That procedure was tolerated well  without any known complications.  The results are as noted above.  Angioplasty with subsequent CYPHER stent was placed to the ostial LAD and an  overall satisfactory result was obtained.  The patient received Integrilin  and heparin as well as labetalol, and nitroglycerin; and, was subsequently  transferred to 6500.   On Nov 12, 2003 he is doing well without complaints.  Blood pressure is  stable at 120/40.  He is currently in normal sinus rhythm.  The groin is  unremarkable and he is felt to be a stable candidate for discharge today.   DISCHARGE CONDITION:  Stable.   DISCHARGE MEDICATIONS:  The patient will  resume his:  1. Lipitor 40 mg a day.  2. Baby aspirin daily.  3. TUMS twice a day.  4. Folic acid daily.  5. Multivitamin daily.  6. Allegra p.r.n.  7. We will be adding Plavix 75 mg a day for the next six months to be taken     along with aspirin.  8. A new prescription for nitroglycerin was also provided for p.r.n. use.   ACTIVITY:  Activities are to be light over the next week.  He may resume his  yoga and water aerobics as of next week.   DISCHARGE INSTRUCTIONS:  The patient is to place an ice pack on the groin;  otherwise, we will have him follow up with Dr. Patty Sermons in the office in  approximately one week; he was asked to call to schedule that appointment or  been seen certainly sooner if problems were to arise.      Sharlee Blew, N.P.                     Colleen Can. Deborah Chalk, M.D.    LC/MEDQ  D:  11/12/2003  T:  11/12/2003  Job:  [id number]   cc:   Sharlet Salina, M.D.  [address]  [place]  [place] [address]  Fax: [phone]   Cassell Clement, M.D.  [address], [address]  [place]  [place] [address]  Fax: [phone]

## 2010-11-16 ENCOUNTER — Other Ambulatory Visit: Payer: Self-pay | Admitting: *Deleted

## 2010-11-16 ENCOUNTER — Ambulatory Visit: Payer: Self-pay | Admitting: Cardiology

## 2010-11-17 ENCOUNTER — Encounter: Payer: Self-pay | Admitting: *Deleted

## 2010-11-17 ENCOUNTER — Other Ambulatory Visit: Payer: Self-pay | Admitting: *Deleted

## 2010-11-17 DIAGNOSIS — E785 Hyperlipidemia, unspecified: Secondary | ICD-10-CM

## 2010-11-18 ENCOUNTER — Other Ambulatory Visit (INDEPENDENT_AMBULATORY_CARE_PROVIDER_SITE_OTHER): Payer: MEDICARE | Admitting: *Deleted

## 2010-11-18 ENCOUNTER — Ambulatory Visit (INDEPENDENT_AMBULATORY_CARE_PROVIDER_SITE_OTHER): Payer: MEDICARE | Admitting: Cardiology

## 2010-11-18 ENCOUNTER — Encounter: Payer: Self-pay | Admitting: Cardiology

## 2010-11-18 DIAGNOSIS — E785 Hyperlipidemia, unspecified: Secondary | ICD-10-CM

## 2010-11-18 DIAGNOSIS — Z954 Presence of other heart-valve replacement: Secondary | ICD-10-CM

## 2010-11-18 DIAGNOSIS — Z951 Presence of aortocoronary bypass graft: Secondary | ICD-10-CM | POA: Insufficient documentation

## 2010-11-18 DIAGNOSIS — Z952 Presence of prosthetic heart valve: Secondary | ICD-10-CM

## 2010-11-18 DIAGNOSIS — Z9889 Other specified postprocedural states: Secondary | ICD-10-CM

## 2010-11-18 DIAGNOSIS — E7800 Pure hypercholesterolemia, unspecified: Secondary | ICD-10-CM

## 2010-11-18 DIAGNOSIS — Z8546 Personal history of malignant neoplasm of prostate: Secondary | ICD-10-CM

## 2010-11-18 DIAGNOSIS — I119 Hypertensive heart disease without heart failure: Secondary | ICD-10-CM | POA: Insufficient documentation

## 2010-11-18 LAB — BASIC METABOLIC PANEL
BUN: 19 mg/dL (ref 6–23)
CO2: 26 mEq/L (ref 19–32)
Calcium: 9 mg/dL (ref 8.4–10.5)
Chloride: 107 mEq/L (ref 96–112)
Creatinine, Ser: 1.1 mg/dL (ref 0.4–1.5)
GFR: 70.55 mL/min (ref >60.00)
Glucose, Bld: 107 mg/dL — ABNORMAL HIGH (ref 70–99)
Potassium: 4.2 mEq/L (ref 3.5–5.1)
Sodium: 140 mEq/L (ref 135–145)

## 2010-11-18 LAB — LIPID PANEL
Cholesterol: 148 mg/dL (ref 0–200)
HDL: 54.7 mg/dL (ref >39.00)
LDL Cholesterol: 63 mg/dL (ref 0–99)
Total CHOL/HDL Ratio: 3
Triglycerides: 151 mg/dL — ABNORMAL HIGH (ref 0.0–149.0)
VLDL: 30.2 mg/dL (ref 0.0–40.0)

## 2010-11-18 LAB — HEPATIC FUNCTION PANEL
ALT: 26 U/L (ref 0–53)
AST: 29 U/L (ref 0–37)
Albumin: 4.1 g/dL (ref 3.5–5.2)
Alkaline Phosphatase: 48 U/L (ref 39–117)
Bilirubin, Direct: 0.1 mg/dL (ref 0.0–0.3)
Total Bilirubin: 1 mg/dL (ref 0.3–1.2)
Total Protein: 6.4 g/dL (ref 6.0–8.3)

## 2010-11-18 NOTE — Assessment & Plan Note (Signed)
The patient has a history of aortic stenosis and underwent pericardial aortic valve replacement in 2006.  He has not been having any problems from his prosthetic valve.  He remains on Plavix and aspirin for his valve and for his coronary disease.  His last echocardiogram was 03/23/09 and showed normal left ventricular ejection fraction and normal prosthetic aortic valve function with no aortic insufficiency.  He has not been experiencing any symptoms of congestive heart failure.  His energy level is good and his exercise tolerance is good.  He is in the process of moving to [facility] a retirement facility and he will be using their swimming pool and their exercise equipment.

## 2010-11-18 NOTE — Progress Notes (Signed)
Randy Chen Date of Birth:  Sep 20, 1927 [facility] [address]   [address] [place], [place]  [address] [phone]           Fax   [phone]  History of Present Illness: This pleasant 77 year old gentleman is seen for a scheduled followup office visit.  He has a history of coronary artery disease and had coronary artery bypass graft surgery in 1987.  He also had a history of aortic stenosis and underwent pericardial aortic valve replacement in 2006.  His last echocardiogram on 03/23/09 showed normal prosthetic valve function and normal left ventricular systolic and diastolic function.  The patient has not been experiencing any congestive heart failure symptoms.  He's not having any angina pectoris.  His last nuclear stress test was 03/30/08 and showed no ischemia and showed normal LV function. The patient has been busy preparing to move to a retirement Center later this month.  Current Outpatient Prescriptions  Medication Sig Dispense Refill  . amLODipine (NORVASC) 10 MG tablet Take 10 mg by mouth daily.        . Ascorbic Acid (VITAMIN C PO) Take 1 tablet by mouth daily.        [name] aspirin 81 MG tablet Take 81 mg by mouth daily.        [name] atorvastatin (LIPITOR) 80 MG tablet Take 40 mg by mouth daily.       [name] CALCIUM PO Take 1,000 mg by mouth daily.        . Cholecalciferol (VITAMIN D PO) Take 1,000 mg by mouth daily.        . clopidogrel (PLAVIX) 75 MG tablet Take 75 mg by mouth daily.        . folic acid (FOLVITE) 400 MCG tablet Take 400 mcg by mouth daily.        [name] leuprolide (LUPRON) 30 MG injection Inject 30 mg into the muscle every 6 (six) months.       [name] lisinopril (PRINIVIL,ZESTRIL) 40 MG tablet Take 80 mg by mouth daily.        . metoprolol (TOPROL-XL) 50 MG 24 hr tablet Take 50 mg by mouth daily.        . multivitamin (THERAGRAN) per tablet Take 1 tablet by mouth daily.        . Nutritional Supplements (GLUCOSAMINE COMPLEX PO) Take 1 tablet by  mouth daily. ( Glucosamine with MSM )       . Omega-3 Fatty Acids (FISH OIL) 1200 MG CAPS Take 1 capsule by mouth daily.          No Known Allergies  Patient Active Problem List  Diagnoses  . Hx of CABG  . History of prosthetic aortic valve  . Benign hypertensive heart disease without heart failure  . Hypercholesterolemia  . History of prostate cancer    History  Smoking status  . Former Smoker  . Quit date: 11/18/1955  Smokeless tobacco  . Not on file    History  Alcohol Use No    Family History  Problem Relation Age of Onset  . Heart disease Father   . Heart failure Mother   . Heart attack Brother     Review of Systems: Constitutional: no fever chills diaphoresis or fatigue or change in weight.  Head and neck: no hearing loss, no epistaxis, no photophobia or visual disturbance. Respiratory: No cough, shortness of breath or wheezing. Cardiovascular: No chest pain peripheral edema, palpitations. Gastrointestinal: No abdominal distention, no abdominal  pain, no change in bowel habits hematochezia or melena. Genitourinary: No dysuria, no frequency, no urgency, no nocturia. Musculoskeletal:No arthralgias, no back pain, no gait disturbance or myalgias. Neurological: No dizziness, no headaches, no numbness, no seizures, no syncope, no weakness, no tremors. Hematologic: No lymphadenopathy, no easy bruising. Psychiatric: No confusion, no hallucinations, no sleep disturbance.    Physical Exam: Filed Vitals:   11/18/10 1005  BP: 130/70  Pulse: 60  The general appearance reveals a well-developed well-nourished gentleman in no distress.Pupils equal and reactive.   Extraocular Movements are full.  There is no scleral icterus.  The mouth and pharynx are normal.  The neck is supple.  The carotids reveal no bruits.  The jugular venous pressure is normal.  The thyroid is not enlarged.  There is no lymphadenopathy.The chest is clear to percussion and auscultation. There are no  rales or rhonchi. Expansion of the chest is symmetrical.The precordium is quiet.  The first heart sound is normal.  The second heart sound is physiologically split.  There is no murmur gallop rub or click.  There is no abnormal lift or heave. There is a soft systolic murmur across the prosthetic valve but no diastolic murmur.The abdomen is soft and nontender. Bowel sounds are normal. The liver and spleen are not enlarged. There Are no abdominal masses. There are no bruits.  Normal extremity without phlebitis or edema.  Pedal pulses are good.Strength is normal and symmetrical in all extremities.  There is no lateralizing weakness.  There are no sensory deficits.The skin is warm and dry.  There is no rash.   Assessment / Plan: Continue same medication.  Recheck in 6 months for followupOffice visit EKG and fasting lab work.

## 2010-11-18 NOTE — Assessment & Plan Note (Signed)
The patient has a history of essential hypertension.  He is not having any adverse side effects from his medication.  He denies headaches dizziness syncope or palpitations.

## 2010-11-18 NOTE — Assessment & Plan Note (Signed)
The patient has known coronary disease.  He underwent coronary artery bypass graft surgery in 1987.  He has not been experiencing any chest pain or angina his last nuclear stress test was 03/30/08 and showed no reversible ischemia and his ejection fraction was 66%.

## 2010-11-18 NOTE — Assessment & Plan Note (Signed)
The patient remains on Lipitor for hypercholesterolemia.  He's not having any adverse side effects from the statin therapy

## 2010-11-18 NOTE — Assessment & Plan Note (Signed)
The patient has a history of previous prostate cancer.  Dr. Vonita Moss is his urologist and saw him recently and he got a good checkup there.

## 2010-11-23 ENCOUNTER — Telehealth: Payer: Self-pay | Admitting: *Deleted

## 2010-11-23 NOTE — Telephone Encounter (Signed)
Message copied by Lorayne Bender on Wed Nov 23, 2010  2:39 PM ------      Message from: Cassell Clement      Created: Sun Nov 20, 2010  9:46 PM       Labs okay except TG and BS slightly high.  Watch carbs.  CSD

## 2010-11-23 NOTE — Telephone Encounter (Signed)
Notified of lab results. 

## 2011-06-19 ENCOUNTER — Other Ambulatory Visit: Payer: Self-pay | Admitting: Cardiology

## 2011-07-24 ENCOUNTER — Ambulatory Visit (INDEPENDENT_AMBULATORY_CARE_PROVIDER_SITE_OTHER): Payer: Medicare Other | Admitting: Cardiology

## 2011-07-24 ENCOUNTER — Ambulatory Visit: Payer: MEDICARE | Admitting: Cardiology

## 2011-07-24 ENCOUNTER — Encounter: Payer: Self-pay | Admitting: Cardiology

## 2011-07-24 VITALS — BP 138/80 | HR 60 | Ht 67.0 in | Wt 200.0 lb

## 2011-07-24 DIAGNOSIS — Z954 Presence of other heart-valve replacement: Secondary | ICD-10-CM

## 2011-07-24 DIAGNOSIS — Z952 Presence of prosthetic heart valve: Secondary | ICD-10-CM

## 2011-07-24 DIAGNOSIS — E7800 Pure hypercholesterolemia, unspecified: Secondary | ICD-10-CM

## 2011-07-24 DIAGNOSIS — Z951 Presence of aortocoronary bypass graft: Secondary | ICD-10-CM

## 2011-07-24 DIAGNOSIS — I119 Hypertensive heart disease without heart failure: Secondary | ICD-10-CM

## 2011-07-24 DIAGNOSIS — Z953 Presence of xenogenic heart valve: Secondary | ICD-10-CM

## 2011-07-24 NOTE — Assessment & Plan Note (Signed)
The patient is on aspirin for his prosthetic aortic valve which is a bovine bioprosthetic valve.  He has not been expressing any TIA symptoms.

## 2011-07-24 NOTE — Assessment & Plan Note (Signed)
Blood pressure has been stable on current therapy.  No headaches or dizzy spells.  No syncope.

## 2011-07-24 NOTE — Assessment & Plan Note (Signed)
The patient has not been expressing any angina pectoris or other symptoms with exertion.

## 2011-07-24 NOTE — Patient Instructions (Signed)
Your physician recommends that you continue on your current medications as directed. Please refer to the Current Medication list given to you today.  Your physician wants you to follow-up in: 6 months. You will receive a reminder letter in the mail two months in advance. If you don't receive a letter, please call our office to schedule the follow-up appointment.  

## 2011-07-24 NOTE — Progress Notes (Signed)
Randy Chen Date of Birth:  Mar 29, 1928 [facility] [address] [place], [place]  [address] [phone]         Fax   [phone]  History of Present Illness: This pleasant 78 year old gentleman is seen for a six-month followup office visit.  He is now living at [facility].  He is enjoying his new surroundings.  He and his wife go to an exercise class III times a week to go to go for twice a week.  He has not been experiencing any chest pain or shortness of breath.  He has a past history of aortic valve replacement in 2006 and had coronary artery bypass graft surgery in 1987.  Current Outpatient Prescriptions  Medication Sig Dispense Refill  . amLODipine (NORVASC) 10 MG tablet Take 10 mg by mouth daily.        . Ascorbic Acid (VITAMIN C PO) Take 1 tablet by mouth daily.        [name] aspirin 81 MG tablet Take 81 mg by mouth daily.        [name] atorvastatin (LIPITOR) 80 MG tablet Take 40 mg by mouth daily.       [name] CALCIUM PO Take 1,000 mg by mouth daily.        . Cholecalciferol (VITAMIN D PO) Take 1,000 mg by mouth daily.        . clopidogrel (PLAVIX) 75 MG tablet Take 75 mg by mouth daily.        . folic acid (FOLVITE) 400 MCG tablet Take 400 mcg by mouth daily.        [name] leuprolide (LUPRON) 30 MG injection Inject 30 mg into the muscle every 6 (six) months.       [name] lisinopril (PRINIVIL,ZESTRIL) 40 MG tablet Take 80 mg by mouth daily.        . metoprolol (TOPROL-XL) 50 MG 24 hr tablet TAKE 1 TABLET DAILY OR AS DIRECTED  90 tablet  2  . multivitamin (THERAGRAN) per tablet Take 1 tablet by mouth daily.        . Nutritional Supplements (GLUCOSAMINE COMPLEX PO) Take 1 tablet by mouth daily. ( Glucosamine with MSM )       . Omega-3 Fatty Acids (FISH OIL) 1200 MG CAPS Take 1 capsule by mouth daily.          No Known Allergies  Patient Active Problem List  Diagnoses  . Hx of CABG  . History of prosthetic aortic valve  . Benign hypertensive heart disease without heart  failure  . Hypercholesterolemia  . History of prostate cancer    History  Smoking status  . Former Smoker  . Quit date: 11/18/1955  Smokeless tobacco  . Not on file    History  Alcohol Use No    Family History  Problem Relation Age of Onset  . Heart disease Father   . Heart failure Mother   . Heart attack Brother     Review of Systems: Constitutional: no fever chills diaphoresis or fatigue or change in weight.  Head and neck: no hearing loss, no epistaxis, no photophobia or visual disturbance. Respiratory: No cough, shortness of breath or wheezing. Cardiovascular: No chest pain peripheral edema, palpitations. Gastrointestinal: No abdominal distention, no abdominal pain, no change in bowel habits hematochezia or melena. Genitourinary: No dysuria, no frequency, no urgency, no nocturia. Musculoskeletal:No arthralgias, no back pain, no gait disturbance or myalgias. Neurological: No dizziness, no headaches, no numbness, no seizures, no syncope, no weakness, no  tremors. Hematologic: No lymphadenopathy, no easy bruising. Psychiatric: No confusion, no hallucinations, no sleep disturbance.    Physical Exam: Filed Vitals:   07/24/11 1601  BP: 138/80  Pulse: 60   the general appearance reveals a well-developed well-nourished gentleman in no distress.Pupils equal and reactive.   Extraocular Movements are full.  There is no scleral icterus.  The mouth and pharynx are normal.  The neck is supple.  The carotids reveal no bruits.  The jugular venous pressure is normal.  The thyroid is not enlarged.  There is no lymphadenopathy.  The chest is clear to percussion and auscultation. There are no rales or rhonchi. Expansion of the chest is symmetrical.  Heart reveals a soft systolic ejection murmur across the prosthetic aortic valve.  No diastolic murmur.  No gallop or rub.The abdomen is soft and nontender. Bowel sounds are normal. The liver and spleen are not enlarged. There Are no  abdominal masses. There are no bruits.  The pedal pulses are good.  There is no phlebitis or edema.  There is no cyanosis or clubbing. Strength is normal and symmetrical in all extremities.  There is no lateralizing weakness.  There are no sensory deficits.  The skin is warm and dry.  There is no rash.    Assessment / Plan: Continue same medication.  Recheck in 6 months for office visit EKG and fasting lipid panel hepatic function panel basal metabolic panel and CBC

## 2011-08-17 ENCOUNTER — Other Ambulatory Visit: Payer: Self-pay | Admitting: *Deleted

## 2011-08-17 MED ORDER — ATORVASTATIN CALCIUM 80 MG PO TABS: 40.0000 mg | ORAL_TABLET | Freq: Every day | ORAL | Status: DC

## 2011-08-17 MED ORDER — AMLODIPINE BESYLATE 10 MG PO TABS: 10.0000 mg | ORAL_TABLET | Freq: Every day | ORAL | Status: DC

## 2011-08-17 MED ORDER — CLOPIDOGREL BISULFATE 75 MG PO TABS: 75.0000 mg | ORAL_TABLET | Freq: Every day | ORAL | Status: DC

## 2011-08-17 NOTE — Telephone Encounter (Signed)
Refilled clopidogrel,amlodipine and atorvastatin

## 2012-01-16 ENCOUNTER — Other Ambulatory Visit: Payer: Self-pay | Admitting: Dermatology

## 2012-01-31 ENCOUNTER — Ambulatory Visit (INDEPENDENT_AMBULATORY_CARE_PROVIDER_SITE_OTHER): Payer: Medicare Other | Admitting: Cardiology

## 2012-01-31 ENCOUNTER — Other Ambulatory Visit (INDEPENDENT_AMBULATORY_CARE_PROVIDER_SITE_OTHER): Payer: Medicare Other

## 2012-01-31 ENCOUNTER — Encounter: Payer: Self-pay | Admitting: Cardiology

## 2012-01-31 ENCOUNTER — Telehealth: Payer: Self-pay | Admitting: *Deleted

## 2012-01-31 VITALS — BP 120/76 | HR 54 | Ht 67.0 in | Wt 195.0 lb

## 2012-01-31 DIAGNOSIS — Z952 Presence of prosthetic heart valve: Secondary | ICD-10-CM

## 2012-01-31 DIAGNOSIS — E7800 Pure hypercholesterolemia, unspecified: Secondary | ICD-10-CM

## 2012-01-31 DIAGNOSIS — I119 Hypertensive heart disease without heart failure: Secondary | ICD-10-CM

## 2012-01-31 DIAGNOSIS — Z954 Presence of other heart-valve replacement: Secondary | ICD-10-CM

## 2012-01-31 DIAGNOSIS — Z951 Presence of aortocoronary bypass graft: Secondary | ICD-10-CM

## 2012-01-31 LAB — BASIC METABOLIC PANEL
BUN: 20 mg/dL (ref 6–23)
CO2: 29 mEq/L (ref 19–32)
Chloride: 103 mEq/L (ref 96–112)
Creatinine, Ser: 1.1 mg/dL (ref 0.4–1.5)
Potassium: 4.2 mEq/L (ref 3.5–5.1)

## 2012-01-31 LAB — LIPID PANEL
Cholesterol: 146 mg/dL (ref 0–200)
LDL Cholesterol: 67 mg/dL (ref 0–99)
Triglycerides: 125 mg/dL (ref 0.0–149.0)

## 2012-01-31 LAB — HEPATIC FUNCTION PANEL
ALT: 21 U/L (ref 0–53)
AST: 24 U/L (ref 0–37)
Albumin: 4.4 g/dL (ref 3.5–5.2)
Total Bilirubin: 0.6 mg/dL (ref 0.3–1.2)

## 2012-01-31 NOTE — Assessment & Plan Note (Signed)
Blood pressure has been remaining stable on current medication.  He does have some mild ankle edema which is probably secondary to his 10 mg amlodipine.  His ankles returned to normal over night

## 2012-01-31 NOTE — Progress Notes (Signed)
Quick Note:  Please report to patient. The recent labs are stable. Continue same medication and careful diet. ______ 

## 2012-01-31 NOTE — Patient Instructions (Addendum)
Your physician wants you to follow-up in: 9 months with fasting labs You will receive a reminder letter in the mail two months in advance. If you don't receive a letter, please call our office to schedule the follow-up appointment.  Your physician recommends that you continue on your current medications as directed. Please refer to the Current Medication list given to you today.  Will obtain labs today and call you with the results

## 2012-01-31 NOTE — Telephone Encounter (Signed)
Message copied by Burnell Blanks on Wed Jan 31, 2012  3:32 PM ------      Message from: Cassell Clement      Created: Wed Jan 31, 2012  3:30 PM       Please report to patient.  The recent labs are stable. Continue same medication and careful diet.

## 2012-01-31 NOTE — Assessment & Plan Note (Signed)
The patient has a past history of coronary disease with CABG remotely.  He has not been having any recurrent chest pain or angina.  He has been exercising regularly at the retirement home [facility]) where he has been living for the past year.  He participates in any regular exercise class given by physical therapists 3 times a week.

## 2012-01-31 NOTE — Progress Notes (Signed)
Randy Chen Date of Birth:  1927-12-06 [facility] [address] [place], [place]  [address] [phone]         Fax   [phone]  History of Present Illness: This pleasant 78 year old gentleman is seen for a followup office visit.  He has a history of known heart disease.  He had coronary artery bypass graft surgery in 1987.  He subsequently developed aortic stenosis and in 2006 underwent aortic valve replacement with a bioprosthetic valve is a past history of essential hypertension and hypercholesterolemia.  Current Outpatient Prescriptions  Medication Sig Dispense Refill  . amLODipine (NORVASC) 10 MG tablet Take 1 tablet (10 mg total) by mouth daily.  90 tablet  3  . Ascorbic Acid (VITAMIN C PO) Take 1 tablet by mouth daily.        [name] aspirin 81 MG tablet Take 81 mg by mouth daily.        [name] atorvastatin (LIPITOR) 80 MG tablet Take 0.5 tablets (40 mg total) by mouth daily.  45 tablet  3  . CALCIUM PO Take 1,000 mg by mouth daily.        . Cholecalciferol (VITAMIN D PO) Take 1,000 mg by mouth daily.        . clopidogrel (PLAVIX) 75 MG tablet Take 1 tablet (75 mg total) by mouth daily.  90 tablet  3  . folic acid (FOLVITE) 400 MCG tablet Take 400 mcg by mouth daily.        [name] leuprolide (LUPRON) 30 MG injection Inject 30 mg into the muscle every 6 (six) months.       [name] lisinopril (PRINIVIL,ZESTRIL) 40 MG tablet Take 80 mg by mouth daily.        . metoprolol (TOPROL-XL) 50 MG 24 hr tablet TAKE 1 TABLET DAILY OR AS DIRECTED  90 tablet  2  . multivitamin (THERAGRAN) per tablet Take 1 tablet by mouth daily.        . Nutritional Supplements (GLUCOSAMINE COMPLEX PO) Take 1 tablet by mouth daily. ( Glucosamine with MSM )       . Omega-3 Fatty Acids (FISH OIL) 1200 MG CAPS Take 1 capsule by mouth daily.          No Known Allergies  Patient Active Problem List  Diagnosis  . Hx of CABG  . History of prosthetic aortic valve  . Benign hypertensive heart disease  without heart failure  . Hypercholesterolemia  . History of prostate cancer    History  Smoking status  . Former Smoker  . Quit date: 11/18/1955  Smokeless tobacco  . Not on file    History  Alcohol Use No    Family History  Problem Relation Age of Onset  . Heart disease Father   . Heart failure Mother   . Heart attack Brother     Review of Systems: Constitutional: no fever chills diaphoresis or fatigue or change in weight.  Head and neck: no hearing loss, no epistaxis, no photophobia or visual disturbance. Respiratory: No cough, shortness of breath or wheezing. Cardiovascular: No chest pain peripheral edema, palpitations. Gastrointestinal: No abdominal distention, no abdominal pain, no change in bowel habits hematochezia or melena. Genitourinary: No dysuria, no frequency, no urgency, no nocturia. Musculoskeletal:No arthralgias, no back pain, no gait disturbance or myalgias. Neurological: No dizziness, no headaches, no numbness, no seizures, no syncope, no weakness, no tremors. Hematologic: No lymphadenopathy, no easy bruising. Psychiatric: No confusion, no hallucinations, no sleep disturbance.    Physical  Exam: Filed Vitals:   01/31/12 0922  BP: 120/76  Pulse: 54   the general appearance reveals a well-developed well-nourished elderly gentleman in no distress.The head and neck exam reveals pupils equal and reactive.  Extraocular movements are full.  There is no scleral icterus.  The mouth and pharynx are normal.  The neck is supple.  The carotids reveal no bruits.  The jugular venous pressure is normal.  The  thyroid is not enlarged.  There is no lymphadenopathy.  The chest is clear to percussion and auscultation.  There are no rales or rhonchi.  Expansion of the chest is symmetrical.  The precordium is quiet.  The first heart sound is normal.  The second heart sound is physiologically split.  There is no murmur gallop rub or click.  There is no abnormal lift or heave.   The abdomen is soft and nontender.  The bowel sounds are normal.  The liver and spleen are not enlarged.  There are no abdominal masses.  There are no abdominal bruits.  Extremities reveal good pedal pulses.  There is no phlebitis or edema.  There is no cyanosis or clubbing.  Strength is normal and symmetrical in all extremities.  There is no lateralizing weakness.  There are no sensory deficits.  The skin is warm and dry.  There is no rash.  EKG today shows sinus bradycardia and minor nonspecific T wave flattening and no ischemic changes   Assessment / Plan: The patient is to continue same medication.  The edema from the amlodipine does not bother him much.  Blood work today is pending.  We will plan to recheck him in about 9 months for followup office visit lipid panel hepatic function panel and basal metabolic panel.  After next visit consider echocardiogram.

## 2012-01-31 NOTE — Assessment & Plan Note (Signed)
The patient has a history of bioprosthetic aortic valve.  He has not been having any symptoms of congestive heart failure.  He has not been aware of any palpitations or arrhythmia.

## 2012-01-31 NOTE — Telephone Encounter (Signed)
Advised patient of lab results  

## 2012-01-31 NOTE — Assessment & Plan Note (Addendum)
The patient has a past history of hypercholesterolemia.  We're checking lab work today.  He remains on Lipitor 40 mg daily.  Is not having any myalgias from the statin

## 2012-05-02 ENCOUNTER — Other Ambulatory Visit: Payer: Self-pay | Admitting: Cardiology

## 2012-05-02 MED ORDER — METOPROLOL SUCCINATE ER 50 MG PO TB24: ORAL_TABLET | ORAL | Status: DC

## 2012-05-10 ENCOUNTER — Other Ambulatory Visit: Payer: Self-pay | Admitting: Cardiology

## 2012-05-13 MED ORDER — METOPROLOL SUCCINATE ER 50 MG PO TB24: ORAL_TABLET | ORAL | Status: DC

## 2012-06-23 ENCOUNTER — Emergency Department (HOSPITAL_COMMUNITY): Payer: Medicare Other

## 2012-06-23 ENCOUNTER — Inpatient Hospital Stay (HOSPITAL_COMMUNITY)
Admission: EM | Admit: 2012-06-23 | Discharge: 2012-06-24 | DRG: 392 | Disposition: A | Discharge status: Home / Self Care | Payer: Medicare Other | Attending: Internal Medicine | Admitting: Internal Medicine

## 2012-06-23 ENCOUNTER — Encounter (HOSPITAL_COMMUNITY): Payer: Self-pay | Admitting: *Deleted

## 2012-06-23 DIAGNOSIS — Z952 Presence of prosthetic heart valve: Secondary | ICD-10-CM

## 2012-06-23 DIAGNOSIS — Z7982 Long term (current) use of aspirin: Secondary | ICD-10-CM

## 2012-06-23 DIAGNOSIS — Z8249 Family history of ischemic heart disease and other diseases of the circulatory system: Secondary | ICD-10-CM

## 2012-06-23 DIAGNOSIS — I119 Hypertensive heart disease without heart failure: Secondary | ICD-10-CM | POA: Diagnosis present

## 2012-06-23 DIAGNOSIS — K5289 Other specified noninfective gastroenteritis and colitis: Principal | ICD-10-CM | POA: Diagnosis present

## 2012-06-23 DIAGNOSIS — K529 Noninfective gastroenteritis and colitis, unspecified: Secondary | ICD-10-CM | POA: Diagnosis present

## 2012-06-23 DIAGNOSIS — Z87891 Personal history of nicotine dependence: Secondary | ICD-10-CM

## 2012-06-23 DIAGNOSIS — I2510 Atherosclerotic heart disease of native coronary artery without angina pectoris: Secondary | ICD-10-CM | POA: Diagnosis present

## 2012-06-23 DIAGNOSIS — Z954 Presence of other heart-valve replacement: Secondary | ICD-10-CM

## 2012-06-23 DIAGNOSIS — Z951 Presence of aortocoronary bypass graft: Secondary | ICD-10-CM

## 2012-06-23 DIAGNOSIS — E860 Dehydration: Secondary | ICD-10-CM | POA: Diagnosis present

## 2012-06-23 DIAGNOSIS — E785 Hyperlipidemia, unspecified: Secondary | ICD-10-CM | POA: Diagnosis present

## 2012-06-23 LAB — COMPREHENSIVE METABOLIC PANEL
Albumin: 4.2 g/dL (ref 3.5–5.2)
Alkaline Phosphatase: 74 U/L (ref 39–117)
BUN: 17 mg/dL (ref 6–23)
Calcium: 9.8 mg/dL (ref 8.4–10.5)
Creatinine, Ser: 1.36 mg/dL — ABNORMAL HIGH (ref 0.50–1.35)
GFR calc Af Amer: 54 mL/min — ABNORMAL LOW (ref >90)
Glucose, Bld: 120 mg/dL — ABNORMAL HIGH (ref 70–99)
Potassium: 4.4 mEq/L (ref 3.5–5.1)
Total Protein: 7.3 g/dL (ref 6.0–8.3)

## 2012-06-23 LAB — CBC WITH DIFFERENTIAL/PLATELET
Basophils Relative: 0 % (ref 0–1)
Eosinophils Absolute: 0.2 10*3/uL (ref 0.0–0.7)
Eosinophils Relative: 1 % (ref 0–5)
Hemoglobin: 13.5 g/dL (ref 13.0–17.0)
Lymphs Abs: 3 10*3/uL (ref 0.7–4.0)
MCH: 31.3 pg (ref 26.0–34.0)
MCHC: 33.9 g/dL (ref 30.0–36.0)
MCV: 92.3 fL (ref 78.0–100.0)
Monocytes Absolute: 1.2 10*3/uL — ABNORMAL HIGH (ref 0.1–1.0)
Monocytes Relative: 8 % (ref 3–12)
Neutrophils Relative %: 71 % (ref 43–77)
RBC: 4.31 MIL/uL (ref 4.22–5.81)

## 2012-06-23 LAB — URINALYSIS, ROUTINE W REFLEX MICROSCOPIC
Ketones, ur: NEGATIVE mg/dL
Leukocytes, UA: NEGATIVE
Protein, ur: NEGATIVE mg/dL
Urobilinogen, UA: 0.2 mg/dL (ref 0.0–1.0)

## 2012-06-23 LAB — PROTIME-INR: Prothrombin Time: 13.5 seconds (ref 11.6–15.2)

## 2012-06-23 LAB — APTT: aPTT: 32 seconds (ref 24–37)

## 2012-06-23 MED ORDER — ASPIRIN EC 81 MG PO TBEC
81.0000 mg | DELAYED_RELEASE_TABLET | Freq: Every evening | ORAL | Status: DC
  Administered 2012-06-24: 81 mg via ORAL
  Filled 2012-06-23: qty 1

## 2012-06-23 MED ORDER — METOPROLOL TARTRATE 12.5 MG HALF TABLET
12.5000 mg | ORAL_TABLET | Freq: Two times a day (BID) | ORAL | Status: DC
  Administered 2012-06-24 (×2): 12.5 mg via ORAL
  Filled 2012-06-23 (×3): qty 1

## 2012-06-23 MED ORDER — ONDANSETRON HCL 4 MG PO TABS
4.0000 mg | ORAL_TABLET | Freq: Four times a day (QID) | ORAL | Status: DC | PRN
  Administered 2012-06-24: 4 mg via ORAL
  Filled 2012-06-23: qty 1

## 2012-06-23 MED ORDER — IOHEXOL 300 MG/ML  SOLN
100.0000 mL | Freq: Once | INTRAMUSCULAR | Status: AC | PRN
  Administered 2012-06-23: 100 mL via INTRAVENOUS

## 2012-06-23 MED ORDER — METRONIDAZOLE IN NACL 5-0.79 MG/ML-% IV SOLN
500.0000 mg | Freq: Once | INTRAVENOUS | Status: AC
  Administered 2012-06-23: 500 mg via INTRAVENOUS
  Filled 2012-06-23: qty 100

## 2012-06-23 MED ORDER — ACETAMINOPHEN 650 MG RE SUPP: 650.0000 mg | Freq: Four times a day (QID) | RECTAL | Status: DC | PRN

## 2012-06-23 MED ORDER — ATORVASTATIN CALCIUM 40 MG PO TABS
40.0000 mg | ORAL_TABLET | Freq: Every day | ORAL | Status: DC
  Administered 2012-06-24: 40 mg via ORAL
  Filled 2012-06-23: qty 1

## 2012-06-23 MED ORDER — ONDANSETRON HCL 4 MG/2ML IJ SOLN
4.0000 mg | Freq: Once | INTRAMUSCULAR | Status: AC
  Administered 2012-06-23: 4 mg via INTRAVENOUS
  Filled 2012-06-23: qty 2

## 2012-06-23 MED ORDER — SODIUM CHLORIDE 0.9 % IJ SOLN
3.0000 mL | Freq: Two times a day (BID) | INTRAMUSCULAR | Status: DC
  Administered 2012-06-24 (×2): 3 mL via INTRAVENOUS

## 2012-06-23 MED ORDER — CIPROFLOXACIN IN D5W 400 MG/200ML IV SOLN
400.0000 mg | Freq: Two times a day (BID) | INTRAVENOUS | Status: DC
  Administered 2012-06-24: 400 mg via INTRAVENOUS
  Filled 2012-06-23 (×4): qty 200

## 2012-06-23 MED ORDER — CLOPIDOGREL BISULFATE 75 MG PO TABS
75.0000 mg | ORAL_TABLET | Freq: Every day | ORAL | Status: DC
Start: 2012-06-24 — End: 2012-06-25
  Administered 2012-06-24: 75 mg via ORAL
  Filled 2012-06-23 (×2): qty 1

## 2012-06-23 MED ORDER — SODIUM CHLORIDE 0.9 % IV SOLN
INTRAVENOUS | Status: AC
  Administered 2012-06-24: via INTRAVENOUS

## 2012-06-23 MED ORDER — ONDANSETRON HCL 4 MG/2ML IJ SOLN: 4.0000 mg | Freq: Four times a day (QID) | INTRAMUSCULAR | Status: DC | PRN

## 2012-06-23 MED ORDER — SODIUM CHLORIDE 0.9 % IV SOLN
INTRAVENOUS | Status: DC
  Administered 2012-06-23 (×3): via INTRAVENOUS

## 2012-06-23 MED ORDER — HYDROCODONE-ACETAMINOPHEN 5-325 MG PO TABS: 1.0000 | ORAL_TABLET | ORAL | Status: DC | PRN

## 2012-06-23 MED ORDER — DOCUSATE SODIUM 100 MG PO CAPS
100.0000 mg | ORAL_CAPSULE | Freq: Two times a day (BID) | ORAL | Status: DC
  Filled 2012-06-23 (×2): qty 1

## 2012-06-23 MED ORDER — HYDROMORPHONE HCL PF 1 MG/ML IJ SOLN
0.5000 mg | Freq: Once | INTRAMUSCULAR | Status: AC
  Administered 2012-06-23: 0.5 mg via INTRAVENOUS
  Filled 2012-06-23: qty 1

## 2012-06-23 MED ORDER — ACETAMINOPHEN 325 MG PO TABS: 650.0000 mg | ORAL_TABLET | Freq: Four times a day (QID) | ORAL | Status: DC | PRN

## 2012-06-23 MED ORDER — ENOXAPARIN SODIUM 40 MG/0.4ML ~~LOC~~ SOLN
40.0000 mg | SUBCUTANEOUS | Status: DC
  Administered 2012-06-24: 40 mg via SUBCUTANEOUS
  Filled 2012-06-23: qty 0.4

## 2012-06-23 MED ORDER — METRONIDAZOLE IN NACL 5-0.79 MG/ML-% IV SOLN
500.0000 mg | Freq: Three times a day (TID) | INTRAVENOUS | Status: DC
  Administered 2012-06-24 (×2): 500 mg via INTRAVENOUS
  Filled 2012-06-23 (×4): qty 100

## 2012-06-23 MED ORDER — CIPROFLOXACIN IN D5W 400 MG/200ML IV SOLN
400.0000 mg | Freq: Once | INTRAVENOUS | Status: AC
  Administered 2012-06-23: 400 mg via INTRAVENOUS
  Filled 2012-06-23: qty 200

## 2012-06-23 NOTE — ED Notes (Signed)
Pt is here with RLQ since yesterday.  Pt denies constipation or diarrhea.  Pt reports loss of appetite

## 2012-06-23 NOTE — ED Notes (Signed)
Family going home.

## 2012-06-23 NOTE — ED Provider Notes (Signed)
History     CSN: [id number]  Arrival date & time 06/23/12  1552   First MD Initiated Contact with Patient 06/23/12 1615      Chief Complaint  Patient presents with  . Abdominal Pain    (Consider location/radiation/quality/duration/timing/severity/associated sxs/prior treatment) HPI Pt reports about 24 hours of progressively worsening lower abdominal pain, initially vague and not well defined but during the day today has become much more localized in RLQ. Associated with nausea but no vomiting. No diarrhea or constipation. He has had loss of appetite, but no fever. No prior history of same, no previous abdominal surgeries.   Past Medical History  Diagnosis Date  . Hypertension   . Hyperlipidemia   . Coronary artery disease     CABG x5 - 1987  . History of atherosclerotic cardiovascular disease   . Aortic stenosis   . History of prostatectomy     Past Surgical History  Procedure Date  . Cardiac valve replacement 2006  . Cardiac catheterization     EF of 66%  . Coronary artery bypass graft 1987    x5    Family History  Problem Relation Age of Onset  . Heart disease Father   . Heart failure Mother   . Heart attack Brother     History  Substance Use Topics  . Smoking status: Former Smoker    Quit date: 11/18/1955  . Smokeless tobacco: Not on file  . Alcohol Use: No      Review of Systems All other systems reviewed and are negative except as noted in HPI.   Allergies  Review of patient's allergies indicates no known allergies.  Home Medications   Current Outpatient Rx  Name  Route  Sig  Dispense  Refill  . AMLODIPINE BESYLATE 10 MG PO TABS   Oral   Take 5 mg by mouth 2 (two) times daily.         . ASPIRIN EC 81 MG PO TBEC   Oral   Take 81 mg by mouth every evening.         . ATORVASTATIN CALCIUM 80 MG PO TABS   Oral   Take 0.5 tablets (40 mg total) by mouth daily.   45 tablet   3   . CALCIUM 1000 + D PO   Oral   Take 1,000 mg by mouth  daily.         [name] VITAMIN D 2000 UNITS PO CAPS   Oral   Take 2,000 Units by mouth daily.         [name] CLOPIDOGREL BISULFATE 75 MG PO TABS   Oral   Take 1 tablet (75 mg total) by mouth daily.   90 tablet   3   . FOLIC ACID 400 MCG PO TABS   Oral   Take 400 mcg by mouth daily.          [name] GLUCOSAMINE 1500 COMPLEX PO   Oral   Take 1,500 mg by mouth daily.         [name] LEUPROLIDE ACETATE (4 MONTH) 30 MG IM KIT   Intramuscular   Inject 30 mg into the muscle every 6 (six) months. At Dr. Estil Daft office         . LISINOPRIL 40 MG PO TABS   Oral   Take 80 mg by mouth daily.          [name] METOPROLOL SUCCINATE ER 50 MG PO TB24   Oral   Take 50 mg by  mouth daily. Take with or immediately following a meal.         . ADULT MULTIVITAMIN W/MINERALS CH   Oral   Take 1 tablet by mouth daily. Theragran         . FISH OIL 1200 MG PO CAPS   Oral   Take 1,200 mg by mouth daily.          [name] VITAMIN C 500 MG PO TABS   Oral   Take 500 mg by mouth daily.           BP 115/72  Pulse 81  Temp 98.1 F (36.7 C) (Oral)  Resp 18  SpO2 100%  Physical Exam  Nursing note and vitals reviewed. Constitutional: He is oriented to person, place, and time. He appears well-developed and well-nourished.  HENT:  Head: Normocephalic and atraumatic.  Eyes: EOM are normal. Pupils are equal, round, and reactive to light.  Neck: Normal range of motion. Neck supple.  Cardiovascular: Normal rate, normal heart sounds and intact distal pulses.   Pulmonary/Chest: Effort normal and breath sounds normal.  Abdominal: Soft. Bowel sounds are normal. He exhibits no distension. There is tenderness (RLQ). There is guarding.  Musculoskeletal: Normal range of motion. He exhibits no edema and no tenderness.  Neurological: He is alert and oriented to person, place, and time. He has normal strength. No cranial nerve deficit or sensory deficit.  Skin: Skin is warm and dry. No rash noted.  Psychiatric: He has  a normal mood and affect.    ED Course  Procedures (including critical care time)  Labs Reviewed  CBC WITH DIFFERENTIAL - Abnormal; Notable for the following:    WBC 15.3 (*)     Neutro Abs 10.9 (*)     Monocytes Absolute 1.2 (*)     All other components within normal limits  COMPREHENSIVE METABOLIC PANEL - Abnormal; Notable for the following:    Glucose, Bld 120 (*)     Creatinine, Ser 1.36 (*)     GFR calc non Af Amer 47 (*)     GFR calc Af Amer 54 (*)     All other components within normal limits  URINALYSIS, ROUTINE W REFLEX MICROSCOPIC - Abnormal; Notable for the following:    APPearance CLOUDY (*)     All other components within normal limits  PROTIME-INR  APTT   Ct Abdomen Pelvis W Contrast  06/23/2012  *RADIOLOGY REPORT*  Clinical Data: Right lower quadrant pain.  CT ABDOMEN AND PELVIS WITH CONTRAST  Technique:  Multidetector CT imaging of the abdomen and pelvis was performed using the standard protocol during bolus administration of intravenous contrast.  Contrast: OMNIPAQUE IOHEXOL 300 MG/ML  SOLN  Comparison:  Stone study 04/23/2003.  Findings:  Liver, spleen, pancreas, adrenal glands, and kidneys normal.  Gallbladder unremarkable by CT.  No biliary ductal dilation.  Stomach and visualized  small bowel unremarkable. Abdominal aorta normal in caliber but heavily calcified.   No significant lymphadenopathy.  No free fluid.  Appendix identified and normal.  Visualized small bowel unremarkable.  No bowel obstruction.  No free fluid.  Prostate surgically absent.    No significant lymphadenopathy. Urinary bladder normal.  There is marked thickening of the wall of the cecum (14 mm for instance, image 61.  This extends for a short distance into the ascending colon with cross-sectional measurements of 62 x 70 x 50 mm. A colonic neoplasm such as adenocarcinoma or carcinoid is not excluded.   Infectious colitides which more  often affect the right colon include Salmonella, Yersinia,  amoebiasis, and tuberculosis, as part of the differential diagnosis.   9 mm noncalcified nodule right lung base (image 3). In the setting of possible neoplasm, metastatic disease not excluded.  Coronary atherosclerosis. Degenerative change lumbar spine.  IMPRESSION:  Neoplastic versus inflammatory process affecting the cecum.  See discussion above.  No evidence for appendicitis.  9 mm nodule right lung base is indeterminate for neoplasm/metastasis. Further workup could include elective CT chest with contrast.   Original Report Authenticated By: [name], M.D.      No diagnosis found.    MDM  Symptoms concerning for appendicitis. Will send for CT. Pt declines pain medications for now.    8:47 PM Reviewed CT images. Discussed results with patient and family. Will admit for IV antibiotics and further evaluation.      Vasiliy Mccarry B. Bernette Mayers, MD 06/23/12 2048

## 2012-06-23 NOTE — ED Notes (Signed)
Patient transported to CT 

## 2012-06-23 NOTE — H&P (Signed)
PCP:    [name], MD  [facility]  Chief Complaint:  Abdominal Pain  HPI: Randy Chen is a 79 y.o. male   has a past medical history of Hypertension; Hyperlipidemia; Coronary artery disease; History of atherosclerotic cardiovascular disease; Aortic stenosis; and History of prostatectomy.   Presented with  Abdominal pain for the past 24h. He have had some diarrhea and bloating prior to that. Denies any fevers or chills.  No chest pain or shortness of breath. His blood pressure was noted to be somewhat soft in ED. But improved with IVF. He denies any weight loss. CT scan of the abdomen was worrisome for localized inflammation vs malignancy. Hospitalist was called for admission.   Review of Systems:    Pertinent positives include: abdominal pain, diarrhea   Constitutional:  No weight loss, night sweats, Fevers, chills, fatigue, weight loss  HEENT:  No headaches, Difficulty swallowing,Tooth/dental problems,Sore throat,  No sneezing, itching, ear ache, nasal congestion, post nasal drip,  Cardio-vascular:  No chest pain, Orthopnea, PND, anasarca, dizziness, palpitations.no Bilateral lower extremity swelling  GI:  No heartburn, indigestion,  nausea, vomiting, change in bowel habits, loss of appetite, melena, blood in stool, hematemesis Resp:  no shortness of breath at rest. No dyspnea on exertion, No excess mucus, no productive cough, No non-productive cough, No coughing up of blood.No change in color of mucus.No wheezing. Skin:  no rash or lesions. No jaundice GU:  no dysuria, change in color of urine, no urgency or frequency. No straining to urinate.  No flank pain.  Musculoskeletal:  No joint pain or no joint swelling. No decreased range of motion. No back pain.  Psych:  No change in mood or affect. No depression or anxiety. No memory loss.  Neuro: no localizing neurological complaints, no tingling, no weakness, no double vision, no gait abnormality, no slurred  speech, no confusion  Otherwise ROS are negative except for above, 10 systems were reviewed  Past Medical History: Past Medical History  Diagnosis Date  . Hypertension   . Hyperlipidemia   . Coronary artery disease     CABG x5 - 1987  . History of atherosclerotic cardiovascular disease   . Aortic stenosis   . History of prostatectomy    Past Surgical History  Procedure Date  . Cardiac valve replacement 2006  . Cardiac catheterization     EF of 66%  . Coronary artery bypass graft 1987    x5     Medications: Prior to Admission medications   Medication Sig Start Date End Date Taking? Authorizing Provider  amLODipine (NORVASC) 10 MG tablet Take 5 mg by mouth 2 (two) times daily.   Yes Historical Provider, MD  aspirin EC 81 MG tablet Take 81 mg by mouth every evening.   Yes Historical Provider, MD  atorvastatin (LIPITOR) 80 MG tablet Take 0.5 tablets (40 mg total) by mouth daily. 08/17/11  Yes Cassell Clement, MD  Calcium Carb-Cholecalciferol (CALCIUM 1000 + D PO) Take 1,000 mg by mouth daily.   Yes Historical Provider, MD  Cholecalciferol (VITAMIN D) 2000 UNITS CAPS Take 2,000 Units by mouth daily.   Yes Historical Provider, MD  clopidogrel (PLAVIX) 75 MG tablet Take 1 tablet (75 mg total) by mouth daily. 08/17/11  Yes Cassell Clement, MD  folic acid (FOLVITE) 400 MCG tablet Take 400 mcg by mouth daily.    Yes Historical Provider, MD  Glucosamine-Chondroit-Vit C-Mn (GLUCOSAMINE 1500 COMPLEX PO) Take 1,500 mg by mouth daily.   Yes Historical  Provider, MD  lisinopril (PRINIVIL,ZESTRIL) 40 MG tablet Take 80 mg by mouth daily.    Yes Historical Provider, MD  metoprolol succinate (TOPROL-XL) 50 MG 24 hr tablet Take 50 mg by mouth daily. Take with or immediately following a meal.   Yes Historical Provider, MD  Multiple Vitamin (MULTIVITAMIN WITH MINERALS) TABS Take 1 tablet by mouth daily. Theragran   Yes Historical Provider, MD  Omega-3 Fatty Acids (FISH OIL) 1200 MG CAPS Take 1,200 mg  by mouth daily.    Yes Historical Provider, MD  vitamin C (ASCORBIC ACID) 500 MG tablet Take 500 mg by mouth daily.   Yes Historical Provider, MD    Allergies:  No Known Allergies  Social History:  Ambulatory   independently   Lives at  Home with family   reports that he quit smoking about 56 years ago. He does not have any smokeless tobacco history on file. He reports that he does not drink alcohol or use illicit drugs.   Family History: family history includes Heart attack in his brother; Heart disease in his father; and Heart failure in his mother.    Physical Exam: Patient Vitals for the past 24 hrs:  BP Temp Temp src Pulse Resp SpO2  06/23/12 2000 112/52 mmHg - - 66  - 96 %  06/23/12 1937 110/50 mmHg 97.7 F (36.5 C) Oral 67  16  99 %  06/23/12 1800 119/59 mmHg - - 61  - 99 %  06/23/12 1700 99/49 mmHg - - 62  - 96 %  06/23/12 1600 115/72 mmHg 98.1 F (36.7 C) Oral 81  18  100 %    1. General:  in No Acute distress 2. Psychological: Alert and  Oriented 3. Head/ENT:   Moist   Mucous Membranes                          Head Non traumatic, neck supple                          Normal   Dentition 4. SKIN:   decreased Skin turgor,  Skin clean Dry and intact no rash 5. Heart: Regular rate and rhythm no Murmur, Rub or gallop 6. Lungs: Clear to auscultation bilaterally, no wheezes or crackles   7. Abdomen: Soft, slight Right lower quadrant tenderness.  Non distended 8. Lower extremities: no clubbing, cyanosis, or edema 9. Neurologically Grossly intact, moving all 4 extremities equally 10. MSK: Normal range of motion  body mass index is unknown because there is no height or weight on file.   Labs on Admission:   [facility] 06/23/12 1602  NA 138  K 4.4  CL 99  CO2 26  GLUCOSE 120*  BUN 17  CREATININE 1.36*  CALCIUM 9.8  MG --  PHOS --    Basename 06/23/12 1602  AST 20  ALT 16  ALKPHOS 74  BILITOT 0.6  PROT 7.3  ALBUMIN 4.2   No results found for this  basename: LIPASE:2,AMYLASE:2 in the last 72 hours  Basename 06/23/12 1602  WBC 15.3*  NEUTROABS 10.9*  HGB 13.5  HCT 39.8  MCV 92.3  PLT 159   No results found for this basename: CKTOTAL:3,CKMB:3,CKMBINDEX:3,TROPONINI:3 in the last 72 hours No results found for this basename: TSH,T4TOTAL,FREET3,T3FREE,THYROIDAB in the last 72 hours No results found for this basename: VITAMINB12:2,FOLATE:2,FERRITIN:2,TIBC:2,IRON:2,RETICCTPCT:2 in the last 72 hours No results found for this basename: HGBA1C  The CrCl is unknown because both a height and weight (above a minimum accepted value) are required for this calculation. ABG No results found for this basename: phart, pco2, po2, hco3, tco2, acidbasedef, o2sat     No results found for this basename: DDIMER    UA no evidence of infection   Cultures: No results found for this basename: sdes, specrequest, cult, reptstatus       Radiological Exams on Admission: Ct Abdomen Pelvis W Contrast  06/23/2012  *RADIOLOGY REPORT*  Clinical Data: Right lower quadrant pain.  CT ABDOMEN AND PELVIS WITH CONTRAST  Technique:  Multidetector CT imaging of the abdomen and pelvis was performed using the standard protocol during bolus administration of intravenous contrast.  Contrast: OMNIPAQUE IOHEXOL 300 MG/ML  SOLN  Comparison:  Stone study 04/23/2003.  Findings:  Liver, spleen, pancreas, adrenal glands, and kidneys normal.  Gallbladder unremarkable by CT.  No biliary ductal dilation.  Stomach and visualized  small bowel unremarkable. Abdominal aorta normal in caliber but heavily calcified.   No significant lymphadenopathy.  No free fluid.  Appendix identified and normal.  Visualized small bowel unremarkable.  No bowel obstruction.  No free fluid.  Prostate surgically absent.    No significant lymphadenopathy. Urinary bladder normal.  There is marked thickening of the wall of the cecum (14 mm for instance, image 61.  This extends for a short distance into the  ascending colon with cross-sectional measurements of 62 x 70 x 50 mm. A colonic neoplasm such as adenocarcinoma or carcinoid is not excluded.   Infectious colitides which more often affect the right colon include Salmonella, Yersinia, amoebiasis, and tuberculosis, as part of the differential diagnosis.   9 mm noncalcified nodule right lung base (image 3). In the setting of possible neoplasm, metastatic disease not excluded.  Coronary atherosclerosis. Degenerative change lumbar spine.  IMPRESSION:  Neoplastic versus inflammatory process affecting the cecum.  See discussion above.  No evidence for appendicitis.  9 mm nodule right lung base is indeterminate for neoplasm/metastasis. Further workup could include elective CT chest with contrast.   Original Report Authenticated By: [name], M.D.     Chart has been reviewed  Assessment/Plan  This is an 79 year old gentleman with history of hypertension and coronary artery disease here abdominal pain and CT scan worrisome for possible inflammation of cecum.  Present on Admission:  . Colitis - localized inflammation of the cecum will provide bowel rest, IV antibiotics, he will need repeat imaging to document clearance given questionable malignancy but clinically inflammation is more likely traumatically.  Dehydration- we'll give IV fluids blood pressure is improving with IV fluid  Administration  Prophylaxis:   Lovenox  CODE STATUS: DNR/DNI per patient's request  Other plan as per orders.  I have spent a total of 55 min on this admission  Randy Chen 06/23/2012, 8:50 PM

## 2012-06-24 ENCOUNTER — Inpatient Hospital Stay (HOSPITAL_COMMUNITY): Payer: Medicare Other

## 2012-06-24 LAB — MAGNESIUM: Magnesium: 2.2 mg/dL (ref 1.5–2.5)

## 2012-06-24 LAB — CBC
HCT: 36.3 % — ABNORMAL LOW (ref 39.0–52.0)
Hemoglobin: 12 g/dL — ABNORMAL LOW (ref 13.0–17.0)
MCH: 30.5 pg (ref 26.0–34.0)
MCV: 92.4 fL (ref 78.0–100.0)
RBC: 3.93 MIL/uL — ABNORMAL LOW (ref 4.22–5.81)

## 2012-06-24 LAB — TSH: TSH: 1.54 u[IU]/mL (ref 0.350–4.500)

## 2012-06-24 LAB — PHOSPHORUS: Phosphorus: 3.6 mg/dL (ref 2.3–4.6)

## 2012-06-24 LAB — COMPREHENSIVE METABOLIC PANEL
AST: 21 U/L (ref 0–37)
Albumin: 3.6 g/dL (ref 3.5–5.2)
Calcium: 9.1 mg/dL (ref 8.4–10.5)
Chloride: 104 mEq/L (ref 96–112)
Creatinine, Ser: 1.34 mg/dL (ref 0.50–1.35)
Total Protein: 6.5 g/dL (ref 6.0–8.3)

## 2012-06-24 MED ORDER — IOHEXOL 300 MG/ML  SOLN
100.0000 mL | Freq: Once | INTRAMUSCULAR | Status: AC | PRN
  Administered 2012-06-24: 80 mL via INTRAVENOUS

## 2012-06-24 MED ORDER — CALCIUM CARBONATE ANTACID 500 MG PO CHEW
1.0000 | CHEWABLE_TABLET | Freq: Four times a day (QID) | ORAL | Status: DC | PRN
  Administered 2012-06-24: 200 mg via ORAL
  Filled 2012-06-24: qty 1

## 2012-06-24 MED ORDER — METRONIDAZOLE 500 MG PO TABS: 500.0000 mg | ORAL_TABLET | Freq: Three times a day (TID) | ORAL | Status: AC

## 2012-06-24 MED ORDER — IOHEXOL 300 MG/ML  SOLN
20.0000 mL | INTRAMUSCULAR | Status: AC
  Administered 2012-06-24: 25 mL via ORAL
  Administered 2012-06-24: 20 mL via ORAL

## 2012-06-24 MED ORDER — CIPROFLOXACIN HCL 500 MG PO TABS: 500.0000 mg | ORAL_TABLET | Freq: Two times a day (BID) | ORAL | Status: AC

## 2012-06-24 NOTE — Progress Notes (Signed)
Assessment done. Agree with previous RN charting. Pt has contrast at bedside for CT. Call bell in place and pts wife at bedside.

## 2012-06-30 NOTE — Discharge Summary (Signed)
Physician Discharge Summary  Randy Chen HKV:[id number] DOB: February 05, 1928 DOA: 06/23/2012  PCP: [name], MD  Admit date: 06/23/2012 Discharge date: 06/30/2012  Time spent: 30 minutes  Recommendations for Outpatient Follow-up:  1. Patient is being discharged home. 2. He will need to have a repeat CT scan done in approximately 2-4 weeks  Discharge Diagnoses:  Active Problems:  History of prosthetic aortic valve  Colitis  hypertension  Discharge Condition: Improved, being discharged home  Diet recommendation: Low-sodium  Filed Weights   06/23/12 2300  Weight: 87 kg (191 lb 12.8 oz)    History of present illness:  79 year old white male past question hypertension and CAD presented with abdominal pain for the past 24 hours prior to admission plus diarrhea. CT scan was worrisome for localized inflammation versus malignancy. Patient was admitted to the hospital service for further evaluation.  Hospital Course:  Patient was seen throughout the day, continue to receive hydration. He was tolerating by mouth. Repeat CT scan done that evening showed little change, but this point patient was able to comfortably eat and had no, pain. Discuss with the patient and the plan is to discharge him home with followup with his primary care physician and repeat scan to 3 weeks. He is amenable to this plan.  Hypertension: Stable. Blood pressures will soft on admission, but improved with IV fluids. Likely secondary to abdominal pain and poor by mouth intake. CAD: Stable.  Procedures:  None  Consultations:  None  Discharge Exam: Filed Vitals:   06/24/12 0651 06/24/12 1011 06/24/12 1341 06/24/12 2112  BP: 114/54  108/51 140/59  Pulse: 63 75 57 74  Temp:   98.5 F (36.9 C) 98.2 F (36.8 C)  TempSrc:    Oral  Resp:   18 20  Height:      Weight:      SpO2: 100%  96% 94%    General: Alert and oriented x3, no acute distress Cardiovascular: Regular rate and rhythm,  S1-S2 Respiratory: Clear to auscultation bilaterally Abdomen: Soft, nontender, nondistended, positive bowel sounds Extremities: No clubbing or cyanosis or edema  Discharge Instructions  Discharge Orders    Future Orders Please Complete By Expires   Diet - low sodium heart healthy      Increase activity slowly          Medication List     As of 06/30/2012  2:43 PM    STOP taking these medications         metoprolol succinate 50 MG 24 hr tablet   Commonly known as: TOPROL-XL      TAKE these medications         amLODipine 10 MG tablet   Commonly known as: NORVASC   Take 5 mg by mouth 2 (two) times daily.      aspirin EC 81 MG tablet   Take 81 mg by mouth every evening.      atorvastatin 80 MG tablet   Commonly known as: LIPITOR   Take 0.5 tablets (40 mg total) by mouth daily.      CALCIUM 1000 + D PO   Take 1,000 mg by mouth daily.      clopidogrel 75 MG tablet   Commonly known as: PLAVIX   Take 1 tablet (75 mg total) by mouth daily.      Fish Oil 1200 MG Caps   Take 1,200 mg by mouth daily.      folic acid 400 MCG tablet   Commonly known as: [employer]  Take 400 mcg by mouth daily.      GLUCOSAMINE 1500 COMPLEX PO   Take 1,500 mg by mouth daily.      lisinopril 40 MG tablet   Commonly known as: PRINIVIL,ZESTRIL   Take 80 mg by mouth daily.      multivitamin with minerals Tabs   Take 1 tablet by mouth daily. Theragran      vitamin C 500 MG tablet   Commonly known as: ASCORBIC ACID   Take 500 mg by mouth daily.      Vitamin D 2000 UNITS Caps   Take 2,000 Units by mouth daily.           Follow-up Information    Follow up with [name], MD. Schedule an appointment as soon as possible for a visit in 3 weeks.   Contact information:   [address] [place] [address] [phone]           The results of significant diagnostics from this hospitalization (including imaging, microbiology, ancillary and laboratory) are listed below  for reference.    Significant Diagnostic Studies: Ct Abdomen Pelvis W Contrast  06/24/2012   IMPRESSION: Relatively similar appearance of the marked thickening of the cecum extending to the base of the appendix and origin of the terminal ileum.  Question inflammatory versus neoplastic.   Original Report Authenticated By: [name], M.D.    Ct Abdomen Pelvis W Contrast  06/23/2012   IMPRESSION:  Neoplastic versus inflammatory process affecting the cecum.  See discussion above.  No evidence for appendicitis.  9 mm nodule right lung base is indeterminate for neoplasm/metastasis. Further workup could include elective CT chest with contrast.   Original Report Authenticated By: [name], M.D.      Labs: Basic Metabolic Panel:  Lab 06/24/12 9147 06/23/12 1602  NA 141 138  K 4.3 4.4  CL 104 99  CO2 25 26  GLUCOSE 107* 120*  BUN 14 17  CREATININE 1.34 1.36*  CALCIUM 9.1 9.8  MG 2.2 --  PHOS 3.6 --   Liver Function Tests:  Lab 06/24/12 0745 06/23/12 1602  AST 21 20  ALT 14 16  ALKPHOS 64 74  BILITOT 0.5 0.6  PROT 6.5 7.3  ALBUMIN 3.6 4.2   CBC:  Lab 06/24/12 0745 06/23/12 1602  WBC 10.2 15.3*  NEUTROABS -- 10.9*  HGB 12.0* 13.5  HCT 36.3* 39.8  MCV 92.4 92.3  PLT 139* 159     Signed:  Kash Davie K  [facility] 06/30/2012, 2:43 PM

## 2012-07-02 ENCOUNTER — Other Ambulatory Visit (HOSPITAL_COMMUNITY): Payer: Self-pay | Admitting: Internal Medicine

## 2012-07-02 DIAGNOSIS — R1900 Intra-abdominal and pelvic swelling, mass and lump, unspecified site: Secondary | ICD-10-CM

## 2012-07-05 ENCOUNTER — Encounter: Payer: Self-pay | Admitting: Cardiology

## 2012-07-12 ENCOUNTER — Ambulatory Visit (HOSPITAL_COMMUNITY): Payer: Medicare Other

## 2012-07-22 ENCOUNTER — Other Ambulatory Visit: Payer: Self-pay | Admitting: Gastroenterology

## 2012-08-03 ENCOUNTER — Other Ambulatory Visit: Payer: Self-pay

## 2012-09-16 ENCOUNTER — Other Ambulatory Visit: Payer: Self-pay | Admitting: *Deleted

## 2012-09-19 ENCOUNTER — Other Ambulatory Visit: Payer: Self-pay | Admitting: *Deleted

## 2012-09-19 MED ORDER — AMLODIPINE BESYLATE 10 MG PO TABS: 10.0000 mg | ORAL_TABLET | Freq: Every day | ORAL | Status: DC

## 2012-09-23 ENCOUNTER — Other Ambulatory Visit: Payer: Self-pay | Admitting: *Deleted

## 2012-09-23 MED ORDER — CLOPIDOGREL BISULFATE 75 MG PO TABS: 75.0000 mg | ORAL_TABLET | Freq: Every day | ORAL | Status: DC

## 2012-10-08 ENCOUNTER — Encounter: Payer: Self-pay | Admitting: Cardiology

## 2012-10-22 ENCOUNTER — Encounter: Payer: Self-pay | Admitting: Cardiology

## 2012-10-22 ENCOUNTER — Ambulatory Visit (INDEPENDENT_AMBULATORY_CARE_PROVIDER_SITE_OTHER): Payer: Medicare Other | Admitting: Cardiology

## 2012-10-22 VITALS — BP 122/64 | HR 62 | Ht 67.0 in | Wt 191.6 lb

## 2012-10-22 DIAGNOSIS — E7800 Pure hypercholesterolemia, unspecified: Secondary | ICD-10-CM

## 2012-10-22 DIAGNOSIS — Z952 Presence of prosthetic heart valve: Secondary | ICD-10-CM

## 2012-10-22 DIAGNOSIS — I119 Hypertensive heart disease without heart failure: Secondary | ICD-10-CM

## 2012-10-22 DIAGNOSIS — Z954 Presence of other heart-valve replacement: Secondary | ICD-10-CM

## 2012-10-22 NOTE — Patient Instructions (Addendum)
Your physician has requested that you have an echocardiogram. Echocardiography is a painless test that uses sound waves to create images of your heart. It provides your doctor with information about the size and shape of your heart and how well your heart's chambers and valves are working. This procedure takes approximately one hour. There are no restrictions for this procedure. NEED TO SCHEDULE IN June  STOP YOUR [facility] OIL  Your physician recommends that you schedule a follow-up appointment in: 9 month ov and ekg

## 2012-10-22 NOTE — Progress Notes (Signed)
Randy Chen Date of Birth:  28-Mar-1928 [facility] [address] [place], [place]  [address] [phone]         Fax   [phone]  History of Present Illness: This pleasant 79 year old gentleman is seen for a followup office visit. He has a history of known heart disease. He had coronary artery bypass graft surgery in 1987. He subsequently developed aortic stenosis and in 2006 underwent aortic valve replacement with a bioprosthetic valve.  There is a past history of essential hypertension and hypercholesterolemia.  The patient is retired and lives at [facility] [employer] [facility].  He enjoys going to the exercise classes 3 times a week.   Current Outpatient Prescriptions  Medication Sig Dispense Refill  . amLODipine (NORVASC) 10 MG tablet Take 1 tablet (10 mg total) by mouth daily.  90 tablet  1  . aspirin EC 81 MG tablet Take 81 mg by mouth every evening.      [name] atorvastatin (LIPITOR) 80 MG tablet Take 0.5 tablets (40 mg total) by mouth daily.  45 tablet  3  . Calcium Carb-Cholecalciferol (CALCIUM 1000 + D PO) Take 1,000 mg by mouth daily.      . Cholecalciferol (VITAMIN D) 2000 UNITS CAPS Take 2,000 Units by mouth daily.      . clopidogrel (PLAVIX) 75 MG tablet Take 1 tablet (75 mg total) by mouth daily.  90 tablet  3  . folic acid (FOLVITE) 400 MCG tablet Take 400 mcg by mouth daily.       . Glucosamine-Chondroit-Vit C-Mn (GLUCOSAMINE 1500 COMPLEX PO) Take 1,500 mg by mouth daily.      [name] lisinopril (PRINIVIL,ZESTRIL) 40 MG tablet Take 80 mg by mouth daily.       . Multiple Vitamin (MULTIVITAMIN WITH MINERALS) TABS Take 1 tablet by mouth daily. Theragran      . vitamin C (ASCORBIC ACID) 500 MG tablet Take 500 mg by mouth daily.       No current facility-administered medications for this visit.    No Known Allergies  Patient Active Problem List   Diagnosis Date Noted  . Colitis 06/23/2012  . Hx of CABG 11/18/2010  . History of prosthetic aortic valve 11/18/2010  .  Benign hypertensive heart disease without heart failure 11/18/2010  . Hypercholesterolemia 11/18/2010  . History of prostate cancer 11/18/2010    History  Smoking status  . Former Smoker  . Quit date: 11/18/1955  Smokeless tobacco  . Not on file    History  Alcohol Use No    Family History  Problem Relation Age of Onset  . Heart disease Father   . Heart failure Mother   . Heart attack Brother     Review of Systems: Constitutional: no fever chills diaphoresis or fatigue or change in weight.  Head and neck: no hearing loss, no epistaxis, no photophobia or visual disturbance. Respiratory: No cough, shortness of breath or wheezing. Cardiovascular: No chest pain peripheral edema, palpitations. Gastrointestinal: No abdominal distention, no abdominal pain, no change in bowel habits hematochezia or melena. Genitourinary: No dysuria, no frequency, no urgency, no nocturia. Musculoskeletal:No arthralgias, no back pain, no gait disturbance or myalgias. Neurological: No dizziness, no headaches, no numbness, no seizures, no syncope, no weakness, no tremors. Hematologic: No lymphadenopathy, no easy bruising. Psychiatric: No confusion, no hallucinations, no sleep disturbance.    Physical Exam: Filed Vitals:   10/22/12 1329  BP: 122/64  Pulse: 62   the general appearance reveals a well-developed well-nourished elderly gentleman is in  no distress.The head and neck exam reveals pupils equal and reactive.  Extraocular movements are full.  There is no scleral icterus.  The mouth and pharynx are normal.  The neck is supple.  The carotids reveal no bruits.  The jugular venous pressure is normal.  The  thyroid is not enlarged.  There is no lymphadenopathy.  The chest is clear to percussion and auscultation.  There are no rales or rhonchi.  Expansion of the chest is symmetrical.  The precordium is quiet.  The first heart sound is normal.  The second heart sound is physiologically split.  There is  no  gallop rub or click.  There is a very soft systolic flow murmur across the aortic valve.  There is no diastolic murmur.  There is no abnormal lift or heave.  The abdomen is soft and nontender.  The bowel sounds are normal.  The liver and spleen are not enlarged.  There are no abdominal masses.  There are no abdominal bruits.  Extremities reveal good pedal pulses.  There is no phlebitis or edema.  There is no cyanosis or clubbing.  Strength is normal and symmetrical in all extremities.  There is no lateralizing weakness.  There are no sensory deficits.  The skin is warm and dry.  There is no rash.     Assessment / Plan: Continue same medication.  Return in June for a two-dimensional echocardiogram. Return in 9 months or followup office visit and EKG

## 2012-10-22 NOTE — Assessment & Plan Note (Signed)
The patient has a history of hypercholesterolemia.  This is monitored by his PCP

## 2012-10-22 NOTE — Assessment & Plan Note (Signed)
Blood pressure was remaining stable on current therapy.  No dizziness or syncope. 

## 2012-10-22 NOTE — Assessment & Plan Note (Signed)
The patient has not been having symptoms of CHF or recurrent angina pectoris.

## 2012-10-31 ENCOUNTER — Other Ambulatory Visit: Payer: Self-pay | Admitting: *Deleted

## 2012-10-31 MED ORDER — ATORVASTATIN CALCIUM 80 MG PO TABS: 40.0000 mg | ORAL_TABLET | Freq: Every day | ORAL | Status: DC

## 2012-12-03 ENCOUNTER — Ambulatory Visit (HOSPITAL_COMMUNITY): Payer: Medicare Other | Attending: Cardiovascular Disease | Admitting: Radiology

## 2012-12-03 DIAGNOSIS — Z87891 Personal history of nicotine dependence: Secondary | ICD-10-CM | POA: Insufficient documentation

## 2012-12-03 DIAGNOSIS — Z952 Presence of prosthetic heart valve: Secondary | ICD-10-CM

## 2012-12-03 DIAGNOSIS — E7800 Pure hypercholesterolemia, unspecified: Secondary | ICD-10-CM | POA: Insufficient documentation

## 2012-12-03 DIAGNOSIS — I359 Nonrheumatic aortic valve disorder, unspecified: Secondary | ICD-10-CM

## 2012-12-03 NOTE — Progress Notes (Signed)
Echocardiogram performed.  

## 2012-12-09 ENCOUNTER — Telehealth: Payer: Self-pay | Admitting: Cardiology

## 2012-12-09 NOTE — Telephone Encounter (Signed)
Advised patient and will send to Dr Clarene Duke

## 2012-12-09 NOTE — Telephone Encounter (Signed)
New Problem  Pt is calling regarding his echo results.

## 2012-12-09 NOTE — Telephone Encounter (Signed)
Message copied by Burnell Blanks on Mon Dec 09, 2012 10:04 AM ------      Message from: Cassell Clement      Created: Tue Dec 03, 2012 12:40 PM       Please report.  The echocardiogram is satisfactory.  The left ventricular function is normal.  The prosthetic aortic valve looks good.  Continue same medication.  Send a copy of the echo to Dr. Catha Gosselin ------

## 2013-01-22 ENCOUNTER — Other Ambulatory Visit: Payer: Self-pay

## 2013-03-31 ENCOUNTER — Other Ambulatory Visit: Payer: Self-pay

## 2013-03-31 MED ORDER — AMLODIPINE BESYLATE 10 MG PO TABS: 10.0000 mg | ORAL_TABLET | Freq: Every day | ORAL | Status: DC

## 2013-04-16 ENCOUNTER — Other Ambulatory Visit: Payer: Self-pay

## 2013-04-16 MED ORDER — ATORVASTATIN CALCIUM 80 MG PO TABS: 40.0000 mg | ORAL_TABLET | Freq: Every day | ORAL | Status: DC

## 2013-04-24 ENCOUNTER — Other Ambulatory Visit: Payer: Self-pay

## 2013-05-13 ENCOUNTER — Ambulatory Visit (INDEPENDENT_AMBULATORY_CARE_PROVIDER_SITE_OTHER): Payer: Medicare Other | Admitting: Cardiology

## 2013-05-13 ENCOUNTER — Encounter: Payer: Self-pay | Admitting: Cardiology

## 2013-05-13 VITALS — BP 125/63 | HR 57 | Ht 67.0 in | Wt 195.8 lb

## 2013-05-13 DIAGNOSIS — I119 Hypertensive heart disease without heart failure: Secondary | ICD-10-CM

## 2013-05-13 DIAGNOSIS — Z951 Presence of aortocoronary bypass graft: Secondary | ICD-10-CM

## 2013-05-13 DIAGNOSIS — R42 Dizziness and giddiness: Secondary | ICD-10-CM

## 2013-05-13 DIAGNOSIS — Z954 Presence of other heart-valve replacement: Secondary | ICD-10-CM

## 2013-05-13 DIAGNOSIS — Z952 Presence of prosthetic heart valve: Secondary | ICD-10-CM

## 2013-05-13 DIAGNOSIS — K5790 Diverticulosis of intestine, part unspecified, without perforation or abscess without bleeding: Secondary | ICD-10-CM | POA: Insufficient documentation

## 2013-05-13 DIAGNOSIS — Z8546 Personal history of malignant neoplasm of prostate: Secondary | ICD-10-CM

## 2013-05-13 MED ORDER — MECLIZINE HCL 25 MG PO TABS: 25.0000 mg | ORAL_TABLET | Freq: Three times a day (TID) | ORAL | Status: DC | PRN

## 2013-05-13 MED ORDER — NITROGLYCERIN 0.4 MG SL SUBL: 0.4000 mg | SUBLINGUAL_TABLET | SUBLINGUAL | Status: DC | PRN

## 2013-05-13 MED ORDER — MECLIZINE HCL 32 MG PO TABS: 32.0000 mg | ORAL_TABLET | Freq: Three times a day (TID) | ORAL | Status: DC | PRN

## 2013-05-13 NOTE — Progress Notes (Signed)
Randy Chen Date of Birth:  1927-07-15 [address] [place], [place]  [address] [phone]         Fax   [phone]  History of Present Illness: This pleasant 79 year old gentleman is seen for a followup office visit. He has a history of known heart disease. He had coronary artery bypass graft surgery in 1987. He subsequently developed aortic stenosis and in 2006 underwent aortic valve replacement with a bioprosthetic valve.  There is a past history of essential hypertension and hypercholesterolemia.  The patient is retired and lives at [employer].  He enjoys going to the exercise classes 3 times a week.  The patient had a two-dimensional echocardiogram on 12/03/12 which showed an ejection fraction of 55-60% and showed good function of his aortic valve bioprosthetic replacement. The patient has been experiencing occasional episodes of what he describes as overwhelming fatigue.  He can do nothing more than just sit quietly in his lounge chair.  The episodes last about 36 hours and then resolved.  During that period he does not have an appetite.  He has poor balance.  The symptoms are suggestive of vertigo.  We will give him a prescription for meclizine 25 mg one 3 or 4 times a day on a when necessary basis   Current Outpatient Prescriptions  Medication Sig Dispense Refill  . amLODipine (NORVASC) 10 MG tablet Take 1 tablet (10 mg total) by mouth daily.  90 tablet  1  . aspirin EC 81 MG tablet Take 81 mg by mouth every evening.      [name] atorvastatin (LIPITOR) 80 MG tablet Take 0.5 tablets (40 mg total) by mouth daily.  45 tablet  3  . Calcium Carb-Cholecalciferol (CALCIUM 1000 + D PO) Take 1,000 mg by mouth daily.      . Cholecalciferol (VITAMIN D) 2000 UNITS CAPS Take 2,000 Units by mouth daily.      . clopidogrel (PLAVIX) 75 MG tablet Take 1 tablet (75 mg total) by mouth daily.  90 tablet  3  . folic acid (FOLVITE) 400 MCG tablet Take 400 mcg by mouth daily.       .  Glucosamine-Chondroit-Vit C-Mn (GLUCOSAMINE 1500 COMPLEX PO) Take 1,500 mg by mouth daily.      [name] lisinopril (PRINIVIL,ZESTRIL) 40 MG tablet Take 80 mg by mouth daily.       . Multiple Vitamin (MULTIVITAMIN WITH MINERALS) TABS Take 1 tablet by mouth daily. Theragran      . vitamin C (ASCORBIC ACID) 500 MG tablet Take 500 mg by mouth daily.      . meclizine (ANTIVERT) 25 MG tablet Take 1 tablet (25 mg total) by mouth 3 (three) times daily as needed for dizziness.  90 tablet  3  . nitroGLYCERIN (NITROSTAT) 0.4 MG SL tablet Place 1 tablet (0.4 mg total) under the tongue every 5 (five) minutes as needed for chest pain.  100 tablet  3   No current facility-administered medications for this visit.    No Known Allergies  Patient Active Problem List   Diagnosis Date Noted  . Diverticulosis 05/13/2013  . Vertigo 05/13/2013  . Colitis 06/23/2012  . Hx of CABG 11/18/2010  . History of prosthetic aortic valve 11/18/2010  . Benign hypertensive heart disease without heart failure 11/18/2010  . Hypercholesterolemia 11/18/2010  . History of prostate cancer 11/18/2010    History  Smoking status  . Former Smoker  . Quit date: 11/18/1955  Smokeless tobacco  . Not on file  History  Alcohol Use No    Family History  Problem Relation Age of Onset  . Heart disease Father   . Heart failure Mother   . Heart attack Brother     Review of Systems: Constitutional: no fever chills diaphoresis or fatigue or change in weight.  Head and neck: no hearing loss, no epistaxis, no photophobia or visual disturbance. Respiratory: No cough, shortness of breath or wheezing. Cardiovascular: No chest pain peripheral edema, palpitations. Gastrointestinal: No abdominal distention, no abdominal pain, no change in bowel habits hematochezia or melena. Genitourinary: No dysuria, no frequency, no urgency, no nocturia. Musculoskeletal:No arthralgias, no back pain, no gait disturbance or myalgias. Neurological: No  dizziness, no headaches, no numbness, no seizures, no syncope, no weakness, no tremors. Hematologic: No lymphadenopathy, no easy bruising. Psychiatric: No confusion, no hallucinations, no sleep disturbance.    Physical Exam: Filed Vitals:   05/13/13 1036  BP: 125/63  Pulse: 57   the general appearance reveals a well-developed well-nourished elderly gentleman is in no distress.The head and neck exam reveals pupils equal and reactive.  Extraocular movements are full.  There is no scleral icterus.  The mouth and pharynx are normal.  The neck is supple.  The carotids reveal no bruits.  The jugular venous pressure is normal.  The  thyroid is not enlarged.  There is no lymphadenopathy.  The chest is clear to percussion and auscultation.  There are no rales or rhonchi.  Expansion of the chest is symmetrical.  The precordium is quiet.  The first heart sound is normal.  The second heart sound is physiologically split.  There is no  gallop rub or click.  There is a very soft systolic flow murmur across the aortic valve.  There is no diastolic murmur.  There is no abnormal lift or heave.  The abdomen is soft and nontender.  The bowel sounds are normal.  The liver and spleen are not enlarged.  There are no abdominal masses.  There are no abdominal bruits.  Extremities reveal good pedal pulses.  There is no phlebitis or edema.  There is no cyanosis or clubbing.  Strength is normal and symmetrical in all extremities.  There is no lateralizing weakness.  There are no sensory deficits.  The skin is warm and dry.  There is no rash.  EKG today shows sinus bradycardia and nonspecific inferior wall T-wave changes.   Assessment / Plan: Continue same medication.  Trial of meclizine for what may be atypical vertigo.  Recheck in 6 months for followup office visit

## 2013-05-13 NOTE — Patient Instructions (Addendum)
Use your NTG under your tongue for recurrent chest pain. May take one tablet every 5 minutes. If you are still having discomfort after 3 tablets in 15 minutes, call 911.  RX SENT FOR MECLIZINE 25 MG THREE TIMES A DAY AS NEEDED FOR DIZZINESS  Your physician wants you to follow-up in: 6 months You will receive a reminder letter in the mail two months in advance. If you don't receive a letter, please call our office to schedule the follow-up appointment.

## 2013-05-13 NOTE — Assessment & Plan Note (Signed)
The patient has had no recurrent chest pain or angina.  His present nitroglycerin tablets are about 77 years old and we gave him a new prescription today

## 2013-05-13 NOTE — Assessment & Plan Note (Signed)
No active symptoms referable to his prostate cancer.  He is followed closely by his urologist

## 2013-05-13 NOTE — Assessment & Plan Note (Signed)
No symptoms of CHF.  No palpitations or tachycardia

## 2013-06-03 ENCOUNTER — Telehealth: Payer: Self-pay | Admitting: *Deleted

## 2013-06-03 NOTE — Telephone Encounter (Signed)
Will forward to  Dr. Brackbill for review 

## 2013-06-03 NOTE — Telephone Encounter (Signed)
Patient called for a metoprolol refill. It looks like to me this medication was stopped at hospital discharge in January 2014. Patient states that he has been taking it all along without stopping. Should he continue taking this? Please advise. Thanks, MI

## 2013-06-03 NOTE — Telephone Encounter (Signed)
Yes refill the metoprolol which he has been taking all along.

## 2013-06-04 MED ORDER — METOPROLOL SUCCINATE ER 50 MG PO TB24: 50.0000 mg | ORAL_TABLET | Freq: Every day | ORAL | Status: DC

## 2013-06-04 NOTE — Telephone Encounter (Signed)
Spoke with patient and he is currently taking Toprol XL 50 mg daily, rx sent to [facility]

## 2013-07-22 ENCOUNTER — Ambulatory Visit: Payer: Medicare Other | Admitting: Cardiology

## 2013-09-03 ENCOUNTER — Encounter: Payer: Self-pay | Admitting: Cardiology

## 2013-09-12 ENCOUNTER — Ambulatory Visit: Payer: Medicare Other | Admitting: Cardiology

## 2013-10-03 ENCOUNTER — Encounter: Payer: Self-pay | Admitting: Cardiology

## 2013-10-03 ENCOUNTER — Ambulatory Visit (INDEPENDENT_AMBULATORY_CARE_PROVIDER_SITE_OTHER): Payer: Medicare Other | Admitting: Cardiology

## 2013-10-03 VITALS — BP 116/64 | HR 62 | Ht 67.0 in | Wt 192.0 lb

## 2013-10-03 DIAGNOSIS — Z951 Presence of aortocoronary bypass graft: Secondary | ICD-10-CM

## 2013-10-03 DIAGNOSIS — Z952 Presence of prosthetic heart valve: Secondary | ICD-10-CM

## 2013-10-03 DIAGNOSIS — I119 Hypertensive heart disease without heart failure: Secondary | ICD-10-CM

## 2013-10-03 DIAGNOSIS — Z954 Presence of other heart-valve replacement: Secondary | ICD-10-CM

## 2013-10-03 DIAGNOSIS — E7800 Pure hypercholesterolemia, unspecified: Secondary | ICD-10-CM

## 2013-10-03 NOTE — Assessment & Plan Note (Signed)
No chest pain to suggest recurrent angina.  He does have the exercise room several days a week and has no difficulty.

## 2013-10-03 NOTE — Progress Notes (Signed)
Randy Chen Date of Birth:  1927-09-30 [address] [place], [place]  [address] [phone]         Fax   [phone]  History of Present Illness: This pleasant 80 year old gentleman is seen for a followup office visit. He has a history of known heart disease. He had coronary artery bypass graft surgery in 1987. He subsequently developed aortic stenosis and in 2006 underwent aortic valve replacement with a bioprosthetic valve.  There is a past history of essential hypertension and hypercholesterolemia.  The patient is retired and lives at [employer].  He enjoys going to the exercise classes 3 times a week.  The patient had a two-dimensional echocardiogram on 12/03/12 which showed an ejection fraction of 55-60% and showed good function of his aortic valve bioprosthetic replacement. The patient has a history of prostate cancer and is followed by Dr. Junious Silk.  At the present time he is off Lupron shots.  He has nocturia 2-4 times a night. The patient has a past history of vertigo.  He has meclizine on hand but has not had to take any. The patient has been battling bronchitis for the past several weeks.  His sputum is clear.  He is not running a fever.   Current Outpatient Prescriptions  Medication Sig Dispense Refill  . amLODipine (NORVASC) 10 MG tablet Take 1 tablet (10 mg total) by mouth daily.  90 tablet  1  . aspirin EC 81 MG tablet Take 81 mg by mouth every evening.      [name] atorvastatin (LIPITOR) 80 MG tablet Take 0.5 tablets (40 mg total) by mouth daily.  45 tablet  3  . Calcium Carb-Cholecalciferol (CALCIUM 1000 + D PO) Take 1,000 mg by mouth daily.      . Cholecalciferol (VITAMIN D) 2000 UNITS CAPS Take 2,000 Units by mouth daily.      . clopidogrel (PLAVIX) 75 MG tablet Take 1 tablet (75 mg total) by mouth daily.  90 tablet  3  . folic acid (FOLVITE) 093 MCG tablet Take 400 mcg by mouth daily.       . Glucosamine-Chondroit-Vit C-Mn (GLUCOSAMINE 1500 COMPLEX PO)  Take 1,500 mg by mouth daily.      [name] lisinopril (PRINIVIL,ZESTRIL) 40 MG tablet Take 80 mg by mouth daily.       . meclizine (ANTIVERT) 25 MG tablet Take 1 tablet (25 mg total) by mouth 3 (three) times daily as needed for dizziness.  90 tablet  3  . metoprolol succinate (TOPROL XL) 50 MG 24 hr tablet Take 1 tablet (50 mg total) by mouth daily.  90 tablet  3  . Multiple Vitamin (MULTIVITAMIN WITH MINERALS) TABS Take 1 tablet by mouth daily. Theragran      . nitroGLYCERIN (NITROSTAT) 0.4 MG SL tablet Place 1 tablet (0.4 mg total) under the tongue every 5 (five) minutes as needed for chest pain.  100 tablet  3  . vitamin C (ASCORBIC ACID) 500 MG tablet Take 500 mg by mouth daily.       No current facility-administered medications for this visit.    No Known Allergies  Patient Active Problem List   Diagnosis Date Noted  . Diverticulosis 05/13/2013  . Vertigo 05/13/2013  . Colitis 06/23/2012  . Hx of CABG 11/18/2010  . History of prosthetic aortic valve 11/18/2010  . Benign hypertensive heart disease without heart failure 11/18/2010  . Hypercholesterolemia 11/18/2010  . History of prostate cancer 11/18/2010    History  Smoking  status  . Former Smoker  . Quit date: 11/18/1955  Smokeless tobacco  . Not on file    History  Alcohol Use No    Family History  Problem Relation Age of Onset  . Heart disease Father   . Heart failure Mother   . Heart attack Brother     Review of Systems: Constitutional: no fever chills diaphoresis or fatigue or change in weight.  Head and neck: no hearing loss, no epistaxis, no photophobia or visual disturbance. Respiratory: No cough, shortness of breath or wheezing. Cardiovascular: No chest pain peripheral edema, palpitations. Gastrointestinal: No abdominal distention, no abdominal pain, no change in bowel habits hematochezia or melena. Genitourinary: No dysuria, no frequency, no urgency, no nocturia. Musculoskeletal:No arthralgias, no back pain,  no gait disturbance or myalgias. Neurological: No dizziness, no headaches, no numbness, no seizures, no syncope, no weakness, no tremors. Hematologic: No lymphadenopathy, no easy bruising. Psychiatric: No confusion, no hallucinations, no sleep disturbance.    Physical Exam: Filed Vitals:   10/03/13 1055  BP: 116/64  Pulse: 62   the general appearance reveals a well-developed well-nourished elderly gentleman is in no distress.The head and neck exam reveals pupils equal and reactive.  Extraocular movements are full.  There is no scleral icterus.  The mouth and pharynx are normal.  The neck is supple.  The carotids reveal no bruits.  The jugular venous pressure is normal.  The  thyroid is not enlarged.  There is no lymphadenopathy.  The chest is clear to percussion and auscultation.  There are no rales or rhonchi.  Expansion of the chest is symmetrical.  The precordium is quiet.  The first heart sound is normal.  The second heart sound is physiologically split.  There is no  gallop rub or click.  There is a very soft systolic flow murmur across the aortic valve.  There is no diastolic murmur.  There is no abnormal lift or heave.  The abdomen is soft and nontender.  The bowel sounds are normal.  The liver and spleen are not enlarged.  There are no abdominal masses.  There are no abdominal bruits.  Extremities reveal good pedal pulses.  There is no phlebitis or edema.  There is no cyanosis or clubbing.  Strength is normal and symmetrical in all extremities.  There is no lateralizing weakness.  There are no sensory deficits.  The skin is warm and dry.  There is no rash.     Assessment / Plan: Continue same medication.  He had a recent complete physical with Dr. [name] and his blood tests turned out fine. Recheck in 6 months for office visit and EKG

## 2013-10-03 NOTE — Assessment & Plan Note (Signed)
The patient has a bioprosthetic aortic valve.  Is not having symptoms of CHF.

## 2013-10-03 NOTE — Patient Instructions (Signed)
Your physician recommends that you continue on your current medications as directed. Please refer to the Current Medication list given to you today.  Your physician wants you to follow-up in: 6 MONTH OV /EKG You will receive a reminder letter in the mail two months in advance. If you don't receive a letter, please call our office to schedule the follow-up appointment.  

## 2013-10-03 NOTE — Assessment & Plan Note (Signed)
Blood pressure was remaining stable on current therapy.  No symptoms of heart failure.  No peripheral edema.

## 2013-10-13 ENCOUNTER — Other Ambulatory Visit: Payer: Self-pay

## 2013-10-13 MED ORDER — AMLODIPINE BESYLATE 10 MG PO TABS: 10.0000 mg | ORAL_TABLET | Freq: Every day | ORAL | Status: DC

## 2013-10-22 ENCOUNTER — Telehealth: Payer: Self-pay | Admitting: Cardiology

## 2013-10-22 NOTE — Telephone Encounter (Signed)
New Message:  Pt states he received an email regarding a stress test. Pt is requesting to speak to Mason. Pt has no orders for a stress test, has not had one recently, and is not scheduled for one.. Pt is confused and would like clarification about the email he received.

## 2013-10-22 NOTE — Telephone Encounter (Signed)
Spoke with patient and he actually received reminder of something else he put in not needing stress test.

## 2013-10-23 ENCOUNTER — Other Ambulatory Visit: Payer: Self-pay

## 2013-10-23 MED ORDER — CLOPIDOGREL BISULFATE 75 MG PO TABS: 75.0000 mg | ORAL_TABLET | Freq: Every day | ORAL | Status: DC

## 2014-04-03 ENCOUNTER — Other Ambulatory Visit: Payer: Self-pay

## 2014-05-04 ENCOUNTER — Ambulatory Visit (INDEPENDENT_AMBULATORY_CARE_PROVIDER_SITE_OTHER): Payer: Medicare Other | Admitting: Physician Assistant

## 2014-05-04 ENCOUNTER — Encounter: Payer: Self-pay | Admitting: Physician Assistant

## 2014-05-04 ENCOUNTER — Telehealth: Payer: Self-pay | Admitting: Cardiology

## 2014-05-04 VITALS — BP 110/64 | HR 71 | Ht 67.0 in | Wt 198.8 lb

## 2014-05-04 DIAGNOSIS — E7800 Pure hypercholesterolemia, unspecified: Secondary | ICD-10-CM

## 2014-05-04 DIAGNOSIS — R079 Chest pain, unspecified: Secondary | ICD-10-CM | POA: Insufficient documentation

## 2014-05-04 DIAGNOSIS — Z951 Presence of aortocoronary bypass graft: Secondary | ICD-10-CM

## 2014-05-04 DIAGNOSIS — R0789 Other chest pain: Secondary | ICD-10-CM

## 2014-05-04 MED ORDER — AMLODIPINE BESYLATE 10 MG PO TABS: 10.0000 mg | ORAL_TABLET | Freq: Every day | ORAL | Status: DC

## 2014-05-04 MED ORDER — ATORVASTATIN CALCIUM 80 MG PO TABS: 40.0000 mg | ORAL_TABLET | Freq: Every day | ORAL | Status: DC

## 2014-05-04 MED ORDER — CLOPIDOGREL BISULFATE 75 MG PO TABS: 75.0000 mg | ORAL_TABLET | Freq: Every day | ORAL | Status: DC

## 2014-05-04 MED ORDER — METOPROLOL SUCCINATE ER 50 MG PO TB24: 50.0000 mg | ORAL_TABLET | Freq: Every day | ORAL | Status: DC

## 2014-05-04 NOTE — Telephone Encounter (Signed)
Called stating he woke up in the middle of the night with chest tightness and nausea.  Lasted about 3 hrs.  Did not take NTG.  Today he still has slight chest tightness. No nausea.  States tightness is across chest and to (L).  No discomfort in arm, neck or jaw.  Spoke w/Brian Hager,PA/flex who advises for him to take a NTG and 4 baby ASA now.  He will see him at 2:00 today.  Advised pt to take NTG and ASA now.  States his wife is with him and will bring him to office.

## 2014-05-04 NOTE — Patient Instructions (Signed)
Your physician recommends that you continue on your current medications as directed. Please refer to the Current Medication list given to you today.   I refilled all medications that Dr. Mare Ferrari prescribed sent into [facility].  Keep follow up appointment with Dr. Mare Ferrari next week.

## 2014-05-04 NOTE — Assessment & Plan Note (Signed)
Patient's chest pain is noncardiac. At its worst it was 3 out of 10 in intensity no associated symptoms other than a little bit of queasiness in his stomach.  He subsequently went to water aerobics for 45 minutes had no exacerbation of his symptoms.  He did try taking one nitroglycerin sublingual and 4 baby aspirin discomfort went from a 3 out of 10 to a 2 out of 10.   He can reproduce the symptom deep inhalation.  EKG shows mild T-wave inversion in inferior leads. No concerns for pericarditis.

## 2014-05-04 NOTE — Assessment & Plan Note (Signed)
Continue statin. 

## 2014-05-04 NOTE — Progress Notes (Signed)
Date:  05/04/2014   ID:  Randy Chen, DOB 1927-09-10, MRN [id number]  PCP:  Gennette Pac, MD  Primary Cardiologist:  Mare Ferrari    History of Present Illness: Randy Chen is a 80 y.o. male This pleasant 80 year old gentleman is seen for a followup office visit. He has a history of known heart disease. He had coronary artery bypass graft surgery in 1987. He subsequently developed aortic stenosis and in 2006 underwent aortic valve replacement with a bioprosthetic valve. There is a past history of essential hypertension and hypercholesterolemia. The patient is retired and lives at [employer]. He enjoys going to the exercise classes 3 times a week. The patient had a two-dimensional echocardiogram on 12/03/12 which showed an ejection fraction of 55-60% and showed good function of his aortic valve bioprosthetic replacement. The patient has a history of prostate cancer and is followed by Dr. Junious Silk. At the present time he is off Lupron shots.  The patient has a past history of vertigo. He has meclizine on hand but has not had to take any.   Patient presents today after developing chest pain. It woke him up. He also reports feeling slightly queasy. He said he read for about 3 hours and went back to sleep. Following day continued to have about 3 out of 10 which was worse with inspiration. He took 4 baby aspirin and 1 nitroglycerin this reduced the pain to about a 2 out of 10. He went to water aerobics later that day and exercise for about 45 minutes with no problems whatsoever.   no acute exacerbations and no worsening of symptoms  The patient currently denies  vomiting, fever, shortness of breath, orthopnea, dizziness, PND, cough, congestion, abdominal pain, hematochezia, melena, lower extremity edema, claudication.  Wt Readings from Last 3 Encounters:  05/04/14 198 lb 12.8 oz (90.175 kg)  10/03/13 192 lb (87.091 kg)  05/13/13 195 lb 12.8 oz (88.814 kg)     Past Medical  History  Diagnosis Date  . Hypertension   . Hyperlipidemia   . Coronary artery disease     CABG x5 - 1987  . History of atherosclerotic cardiovascular disease   . Aortic stenosis   . History of prostatectomy     Current Outpatient Prescriptions  Medication Sig Dispense Refill  . amLODipine (NORVASC) 10 MG tablet Take 1 tablet (10 mg total) by mouth daily. 90 tablet 3  . aspirin EC 81 MG tablet Take 81 mg by mouth every evening.    [name] atorvastatin (LIPITOR) 80 MG tablet Take 0.5 tablets (40 mg total) by mouth daily. 90 tablet 1  . Calcium Carb-Cholecalciferol (CALCIUM 1000 + D PO) Take 1,000 mg by mouth daily.    . Cholecalciferol (VITAMIN D) 2000 UNITS CAPS Take 2,000 Units by mouth daily.    . clopidogrel (PLAVIX) 75 MG tablet Take 1 tablet (75 mg total) by mouth daily. 90 tablet 3  . folic acid (FOLVITE) 409 MCG tablet Take 400 mcg by mouth daily.     . Glucosamine-Chondroit-Vit C-Mn (GLUCOSAMINE 1500 COMPLEX PO) Take 1,500 mg by mouth daily.    [name] lisinopril (PRINIVIL,ZESTRIL) 40 MG tablet Take 80 mg by mouth daily.     . meclizine (ANTIVERT) 25 MG tablet Take 1 tablet (25 mg total) by mouth 3 (three) times daily as needed for dizziness. 90 tablet 3  . metoprolol succinate (TOPROL-XL) 50 MG 24 hr tablet Take 1 tablet (50 mg total) by mouth daily. 90 tablet 3  . Multiple Vitamin (  MULTIVITAMIN WITH MINERALS) TABS Take 1 tablet by mouth daily. Theragran    . nitroGLYCERIN (NITROSTAT) 0.4 MG SL tablet Place 1 tablet (0.4 mg total) under the tongue every 5 (five) minutes as needed for chest pain. 100 tablet 3  . vitamin C (ASCORBIC ACID) 500 MG tablet Take 500 mg by mouth daily.     No current facility-administered medications for this visit.    Allergies:   No Known Allergies  Social History:  The patient  reports that he quit smoking about 58 years ago. He does not have any smokeless tobacco history on file. He reports that he does not drink alcohol or use illicit drugs.   Family  history:   Family History  Problem Relation Age of Onset  . Heart disease Father   . Heart failure Mother   . Heart attack Brother     ROS:  Please see the history of present illness.  All other systems reviewed and negative.   PHYSICAL EXAM: VS:  BP 110/64 mmHg  Pulse 71  Ht 5\' 7"  (1.702 m)  Wt 198 lb 12.8 oz (90.175 kg)  BMI 31.13 kg/m2 obese, well developed, in no acute distress HEENT: Pupils are equal round react to light accommodation extraocular movements are intact.  Neck: no JVDNo cervical lymphadenopathy. Cardiac: Regular rate and rhythm with 1/6 (murmur Lungs:  clear to auscultation bilaterally, no wheezing, rhonchi or rales Abd: soft, nontender, positive bowel sounds all quadrants, no hepatosplenomegaly Ext: no lower extremity edema.  2+ radial and dorsalis pedis pulses. Skin: warm and dry Neuro:  Grossly normal  EKG:    NSR, 71 BPM Inferior TWI.    ASSESSMENT AND PLAN:  Problem List Items Addressed This Visit    Chest pain    Patient's chest pain is noncardiac. At its worst it was 3 out of 10 in intensity no associated symptoms other than a little bit of queasiness in his stomach.  He subsequently went to water aerobics for 45 minutes had no exacerbation of his symptoms.  He did try taking one nitroglycerin sublingual and 4 baby aspirin discomfort went from a 3 out of 10 to a 2 out of 10.   He can reproduce the symptom deep inhalation.  EKG shows mild T-wave inversion in inferior leads. No concerns for pericarditis.    Hx of CABG - Primary   Relevant Medications      amLODIpine (NORVASC) tablet      clopidogrel (PLAVIX) tablet      metoprolol succinate (TOPROL-XL) 24 hr tablet   Other Relevant Orders      EKG 12-Lead   Hypercholesterolemia    Continue statin.    Relevant Medications      amLODIpine (NORVASC) tablet      metoprolol succinate (TOPROL-XL) 24 hr tablet      atorvastatin (LIPITOR) tablet

## 2014-05-04 NOTE — Telephone Encounter (Signed)
New Message:  Pt is calling in stating that he is having tightness in his chest and would like to have this checked out by Dr. Mare Ferrari as soon as possible. He is aware that he has an appt on 11/25 but he would like to be seen before then. Please call  Thanks

## 2014-05-13 ENCOUNTER — Encounter: Payer: Self-pay | Admitting: Cardiology

## 2014-05-13 ENCOUNTER — Ambulatory Visit (INDEPENDENT_AMBULATORY_CARE_PROVIDER_SITE_OTHER): Payer: Medicare Other | Admitting: Cardiology

## 2014-05-13 VITALS — BP 108/60 | HR 54 | Ht 67.0 in | Wt 200.0 lb

## 2014-05-13 DIAGNOSIS — I119 Hypertensive heart disease without heart failure: Secondary | ICD-10-CM

## 2014-05-13 DIAGNOSIS — Z954 Presence of other heart-valve replacement: Secondary | ICD-10-CM

## 2014-05-13 DIAGNOSIS — R0789 Other chest pain: Secondary | ICD-10-CM | POA: Insufficient documentation

## 2014-05-13 DIAGNOSIS — Z952 Presence of prosthetic heart valve: Secondary | ICD-10-CM

## 2014-05-13 NOTE — Progress Notes (Signed)
Randy Chen Date of Birth:  1928-03-10 Randy Chen [address] [place] Randy Chen, [place]  [address] [phone]        Fax   [phone]   History of Present Illness: This pleasant 80 year old gentleman is seen for a followup office visit. He has a history of known heart disease. He had coronary artery bypass graft surgery in 1987. He subsequently developed aortic stenosis and in 2006 underwent aortic valve replacement with a bioprosthetic valve. There is a past history of essential hypertension and hypercholesterolemia. The patient is retired and lives at [employer]. He enjoys going to the exercise classes 3 times a week. The patient had a two-dimensional echocardiogram on 12/03/12 which showed an ejection fraction of 55-60% and showed good function of his aortic valve bioprosthetic replacement.  2 weeks ago the patient woke up elemental leg with a feeling of pressure and constriction in his chest.  There was no radiation to the arm.  There was no diaphoresis.  It did not feel like indigestion to him and he had not eaten anything unusual that night.  It lasted about 3 hours and then subsided on its own.  It has not recurred.  He has not been experiencing any exertional chest discomfort.  He has not had a recent stress test. The patient has a history of prostate cancer and is followed by Dr. Junious Silk. At the present time he is off Lupron shots. He has nocturia 2-4 times a night. The patient has a past history of vertigo. He has meclizine on hand but has not had to take any. The patient has been having occasional problems with diarrhea.  We reviewed his medicines.  He does not appear to be on any medication that would be causing diarrhea.  Current Outpatient Prescriptions  Medication Sig Dispense Refill  . amLODipine (NORVASC) 10 MG tablet Take 1 tablet (10 mg total) by mouth daily. 90 tablet 3  . aspirin EC 81 MG tablet Take 81 mg by mouth every evening.    [name]  atorvastatin (LIPITOR) 80 MG tablet Take 0.5 tablets (40 mg total) by mouth daily. 90 tablet 1  . Calcium Carb-Cholecalciferol (CALCIUM 1000 + D PO) Take 1,000 mg by mouth daily.    . Cholecalciferol (VITAMIN D) 2000 UNITS CAPS Take 2,000 Units by mouth daily.    . clopidogrel (PLAVIX) 75 MG tablet Take 1 tablet (75 mg total) by mouth daily. 90 tablet 3  . folic acid (FOLVITE) 299 MCG tablet Take 400 mcg by mouth daily.     . Glucosamine-Chondroit-Vit C-Mn (GLUCOSAMINE 1500 COMPLEX PO) Take 1,500 mg by mouth daily.    [name] lisinopril (PRINIVIL,ZESTRIL) 40 MG tablet Take 80 mg by mouth daily.     . meclizine (ANTIVERT) 25 MG tablet Take 1 tablet (25 mg total) by mouth 3 (three) times daily as needed for dizziness. 90 tablet 3  . metoprolol succinate (TOPROL-XL) 50 MG 24 hr tablet Take 1 tablet (50 mg total) by mouth daily. 90 tablet 3  . Multiple Vitamin (MULTIVITAMIN WITH MINERALS) TABS Take 1 tablet by mouth daily. Theragran    . nitroGLYCERIN (NITROSTAT) 0.4 MG SL tablet Place 1 tablet (0.4 mg total) under the tongue every 5 (five) minutes as needed for chest pain. 100 tablet 3  . vitamin C (ASCORBIC ACID) 500 MG tablet Take 500 mg by mouth daily.     No current facility-administered medications for this visit.    No Known Allergies  Patient Active Problem  List   Diagnosis Date Noted  . Chest discomfort 05/13/2014  . Chest pain 05/04/2014  . Diverticulosis 05/13/2013  . Vertigo 05/13/2013  . Colitis 06/23/2012  . Hx of CABG 11/18/2010  . History of prosthetic aortic valve 11/18/2010  . Benign hypertensive heart disease without heart failure 11/18/2010  . Hypercholesterolemia 11/18/2010  . History of prostate cancer 11/18/2010    History  Smoking status  . Former Smoker  . Quit date: 11/18/1955  Smokeless tobacco  . Not on file    History  Alcohol Use No    Family History  Problem Relation Age of Onset  . Heart disease Father   . Heart failure Mother   . Heart attack  Brother     Review of Systems: Constitutional: no fever chills diaphoresis or fatigue or change in weight.  Head and neck: no hearing loss, no epistaxis, no photophobia or visual disturbance. Respiratory: No cough, shortness of breath or wheezing. Cardiovascular: No chest pain peripheral edema, palpitations. Gastrointestinal: No abdominal distention, no abdominal pain, no change in bowel habits hematochezia or melena. Genitourinary: No dysuria, no frequency, no urgency, no nocturia. Musculoskeletal:No arthralgias, no back pain, no gait disturbance or myalgias. Neurological: No dizziness, no headaches, no numbness, no seizures, no syncope, no weakness, no tremors. Hematologic: No lymphadenopathy, no easy bruising. Psychiatric: No confusion, no hallucinations, no sleep disturbance.   Wt Readings from Last 3 Encounters:  05/13/14 200 lb (90.719 kg)  05/04/14 198 lb 12.8 oz (90.175 kg)  10/03/13 192 lb (87.091 kg)    Physical Exam: Filed Vitals:   05/13/14 1458  BP: 108/60  Pulse: 54  The patient appears to be in no distress.  Head and neck exam reveals that the pupils are equal and reactive.  The extraocular movements are full.  There is no scleral icterus.  Mouth and pharynx are benign.  No lymphadenopathy.  No carotid bruits.  The jugular venous pressure is normal.  Thyroid is not enlarged or tender.  Chest is clear to percussion and auscultation.  No rales or rhonchi.  Expansion of the chest is symmetrical.  Heart reveals no abnormal lift or heave.  First and second heart sounds are normal.  There is a soft systolic murmur across the aortic valve.  There is no diastolic murmur.  The abdomen is soft and nontender.  Bowel sounds are normoactive.  There is no hepatosplenomegaly or mass.  There are no abdominal bruits.  Extremities reveal no phlebitis or edema.  Pedal pulses are good.  There is no cyanosis or clubbing.  Neurologic exam is normal strength and no lateralizing  weakness.  No sensory deficits.  Integument reveals no rash  EKG today shows sinus bradycardia and is otherwise within normal limits and is unchanged from 05/04/14  Assessment / Plan: 1.  Ischemic heart disease status post CABG in 1987 2.  Status post bioprosthetic aortic valve replacement in 2006 3.  Essential hypertension 4.  Hypercholesterolemia 5.  History of prostate cancer followed by Dr. Junious Silk  Disposition: Continue same medication.  We will have him return for a treadmill Myoview stress test. Recheck for office visit in 6 months

## 2014-05-13 NOTE — Patient Instructions (Addendum)
Your physician wants you to follow-up in: 6 MONTHS. You will receive a reminder letter in the mail two months in advance. If you don't receive a letter, please call our office to schedule the follow-up appointment.  Your physician recommends that you continue on your current medications as directed. Please refer to the Current Medication list given to you today.  Your physician has requested that you have en exercise stress myoview. For further information please visit [email or web address]. Please follow instruction sheet, as given.

## 2014-05-13 NOTE — Assessment & Plan Note (Signed)
Blood pressures remaining stable on current therapy.  The patient is not having any dizziness or syncope.

## 2014-05-13 NOTE — Assessment & Plan Note (Signed)
The patient had a recent episode of chest discomfort at rest.  He has not had any recent exertional chest pain.  We will have him return for a treadmill Myoview stress test.  His resting electrocardiogram today shows sinus bradycardia and no ischemic changes.

## 2014-05-13 NOTE — Progress Notes (Deleted)
Date:  05/13/2014   ID:  Randy Chen, DOB 01-31-1928, MRN [id number]  PCP:  Gennette Pac, MD  Primary Cardiologist:  Mare Ferrari    History of Present Illness: Randy Chen is a 80 y.o. male This pleasant 80 year old gentleman is seen for a followup office visit. He has a history of known heart disease. He had coronary artery bypass graft surgery in 1987. He subsequently developed aortic stenosis and in 2006 underwent aortic valve replacement with a bioprosthetic valve. There is a past history of essential hypertension and hypercholesterolemia. The patient is retired and lives at [employer]. He enjoys going to the exercise classes 3 times a week. The patient had a two-dimensional echocardiogram on 12/03/12 which showed an ejection fraction of 55-60% and showed good function of his aortic valve bioprosthetic replacement. The patient has a history of prostate cancer and is followed by Dr. Junious Silk. At the present time he is off Lupron shots.  The patient has a past history of vertigo. He has meclizine on hand but has not had to take any.   Patient presents today after developing chest pain. It woke him up. He also reports feeling slightly queasy. He said he read for about 3 hours and went back to sleep. Following day continued to have about 3 out of 10 which was worse with inspiration. He took 4 baby aspirin and 1 nitroglycerin this reduced the pain to about a 2 out of 10. He went to water aerobics later that day and exercise for about 45 minutes with no problems whatsoever.   no acute exacerbations and no worsening of symptoms  The patient currently denies  vomiting, fever, shortness of breath, orthopnea, dizziness, PND, cough, congestion, abdominal pain, hematochezia, melena, lower extremity edema, claudication.  Wt Readings from Last 3 Encounters:  05/13/14 200 lb (90.719 kg)  05/04/14 198 lb 12.8 oz (90.175 kg)  10/03/13 192 lb (87.091 kg)     Past Medical History    Diagnosis Date  . Hypertension   . Hyperlipidemia   . Coronary artery disease     CABG x5 - 1987  . History of atherosclerotic cardiovascular disease   . Aortic stenosis   . History of prostatectomy     Current Outpatient Prescriptions  Medication Sig Dispense Refill  . amLODipine (NORVASC) 10 MG tablet Take 1 tablet (10 mg total) by mouth daily. 90 tablet 3  . aspirin EC 81 MG tablet Take 81 mg by mouth every evening.    [name] atorvastatin (LIPITOR) 80 MG tablet Take 0.5 tablets (40 mg total) by mouth daily. 90 tablet 1  . Calcium Carb-Cholecalciferol (CALCIUM 1000 + D PO) Take 1,000 mg by mouth daily.    . Cholecalciferol (VITAMIN D) 2000 UNITS CAPS Take 2,000 Units by mouth daily.    . clopidogrel (PLAVIX) 75 MG tablet Take 1 tablet (75 mg total) by mouth daily. 90 tablet 3  . folic acid (FOLVITE) 443 MCG tablet Take 400 mcg by mouth daily.     . Glucosamine-Chondroit-Vit C-Mn (GLUCOSAMINE 1500 COMPLEX PO) Take 1,500 mg by mouth daily.    [name] lisinopril (PRINIVIL,ZESTRIL) 40 MG tablet Take 80 mg by mouth daily.     . meclizine (ANTIVERT) 25 MG tablet Take 1 tablet (25 mg total) by mouth 3 (three) times daily as needed for dizziness. 90 tablet 3  . metoprolol succinate (TOPROL-XL) 50 MG 24 hr tablet Take 1 tablet (50 mg total) by mouth daily. 90 tablet 3  . Multiple Vitamin (MULTIVITAMIN  WITH MINERALS) TABS Take 1 tablet by mouth daily. Theragran    . nitroGLYCERIN (NITROSTAT) 0.4 MG SL tablet Place 1 tablet (0.4 mg total) under the tongue every 5 (five) minutes as needed for chest pain. 100 tablet 3  . vitamin C (ASCORBIC ACID) 500 MG tablet Take 500 mg by mouth daily.     No current facility-administered medications for this visit.    Allergies:   No Known Allergies  Social History:  The patient  reports that he quit smoking about 58 years ago. He does not have any smokeless tobacco history on file. He reports that he does not drink alcohol or use illicit drugs.   Family history:    Family History  Problem Relation Age of Onset  . Heart disease Father   . Heart failure Mother   . Heart attack Brother     ROS:  Please see the history of present illness.  All other systems reviewed and negative.   PHYSICAL EXAM: VS:  BP 108/60 mmHg  Pulse 54  Ht 5\' 7"  (1.702 m)  Wt 200 lb (90.719 kg)  BMI 31.32 kg/m2 obese, well developed, in no acute distress HEENT: Pupils are equal round react to light accommodation extraocular movements are intact.  Neck: no JVDNo cervical lymphadenopathy. Cardiac: Regular rate and rhythm with 1/6 (murmur Lungs:  clear to auscultation bilaterally, no wheezing, rhonchi or rales Abd: soft, nontender, positive bowel sounds all quadrants, no hepatosplenomegaly Ext: no lower extremity edema.  2+ radial and dorsalis pedis pulses. Skin: warm and dry Neuro:  Grossly normal  EKG:    NSR, 71 BPM Inferior TWI.    ASSESSMENT AND PLAN:  Problem List Items Addressed This Visit    Benign hypertensive heart disease without heart failure - Primary

## 2014-05-28 ENCOUNTER — Ambulatory Visit (HOSPITAL_COMMUNITY): Payer: Medicare Other | Attending: Cardiology | Admitting: Radiology

## 2014-05-28 DIAGNOSIS — Z954 Presence of other heart-valve replacement: Secondary | ICD-10-CM

## 2014-05-28 DIAGNOSIS — R0789 Other chest pain: Secondary | ICD-10-CM

## 2014-05-28 DIAGNOSIS — R079 Chest pain, unspecified: Secondary | ICD-10-CM | POA: Diagnosis present

## 2014-05-28 MED ORDER — TECHNETIUM TC 99M SESTAMIBI GENERIC - CARDIOLITE
30.0000 | Freq: Once | INTRAVENOUS | Status: AC | PRN
  Administered 2014-05-28: 30 via INTRAVENOUS

## 2014-05-28 MED ORDER — TECHNETIUM TC 99M SESTAMIBI GENERIC - CARDIOLITE
10.0000 | Freq: Once | INTRAVENOUS | Status: AC | PRN
  Administered 2014-05-28: 10 via INTRAVENOUS

## 2014-05-28 NOTE — Progress Notes (Signed)
Randy Chen [address] [place], [place] [address] [phone]    Cardiology Nuclear Med Study  Randy Chen is a 80 y.o. male     MRN : [id number]     DOB: March 11, 1928  Procedure Date: 05/28/2014  Nuclear Med Background Indication for Stress Test:  Evaluation for Ischemia and Follow Up CAD History:  CAD-CABG-AVR, MPI- 5 yrs ago NL Cardiac Risk Factors: Hypertension  Symptoms:  Chest Pain, Palpitations and SOB   Nuclear Pre-Procedure Caffeine/Decaff Intake:  None NPO After: 6 pm   Lungs:  clear O2 Sat: 96% on room air. IV 0.9% NS with Angio Cath:  22g  IV Site: L Hand  IV Started by:  Crissie Figures, RN  Chest Size (in):  46 Cup Size: n/a  Height: 5\' 7"  (1.702 m)  Weight:  192 lb (87.091 kg)  BMI:  Body mass index is 30.06 kg/(m^2). Tech Comments:  Toprol  Held x 24 hrs    Nuclear Med Study 1 or 2 day study: 1 day  Stress Test Type:  Stress  Reading MD: N/A  Order Authorizing Provider:  Darlin Coco, MD  Resting Radionuclide: Technetium 86m Sestamibi  Resting Radionuclide Dose: 11.0 mCi   Stress Radionuclide:  Technetium 53m Sestamibi  Stress Radionuclide Dose: 33.0 mCi           Stress Protocol Rest HR: 54 Stress HR: 121  Rest BP: 124/47 Stress BP: 152/71  Exercise Time (min): 4:31 METS: 6.40   Predicted Max HR: 136 bpm % Max HR: 88.97 bpm Rate Pressure Product: 18392   Dose of Adenosine (mg):  n/a Dose of Lexiscan: n/a mg  Dose of Atropine (mg): n/a Dose of Dobutamine: n/a mcg/kg/min (at max HR)  Stress Test Technologist: Perrin Maltese, EMT-P  Nuclear Technologist:  Earl Many, CNMT     Rest Procedure:  Myocardial perfusion imaging was performed at rest 45 minutes following the intravenous administration of Technetium 30m Sestamibi. Rest ECG: NSR with non-specific ST-T wave changes.  Occasional PVCs   Stress Procedure:  The patient exercised on the treadmill utilizing the Bruce Protocol for 4:31 minutes. The  patient stopped due to sob and denied any chest pain.  Technetium 68m Sestamibi was injected at peak exercise and myocardial perfusion imaging was performed after a brief delay. Stress ECG: No significant change from baseline ECG.  He had trigeminy and couplets.  QPS Raw Data Images:  Normal; no motion artifact; normal heart/lung ratio. Stress Images:  There was a large area of moderate attenuation of the mid and basal inferolateral wall.  There was a small area of moderate attenaution of the mid anterior wall.   Rest Images:  There was a large area of moderate attenuation of the mid and basal inferolateral wall.  There was a small area of moderate attenaution of the mid anterior wall. Subtraction (SDS):  No evidence of ischemia. Transient Ischemic Dilatation (Normal <1.22):  0.96 Lung/Heart Ratio (Normal <0.45):  0.22  Quantitative Gated Spect Images QGS EDV:  77 ml QGS ESV:  23 ml  Impression Exercise Capacity:  Fair exercise capacity. BP Response:  Normal blood pressure response. Clinical Symptoms:  No significant symptoms noted. ECG Impression:  No significant ST segment change suggestive of ischemia. Comparison with Prior Nuclear Study: No images to compare  Overall Impression:  Intermediate risk stress nuclear study .  There is evidence of  ischemia in the mid/basal inferolateral wall.  .  LV Ejection Fraction: 70%.  LV  Wall Motion:  NL LV Function; NL Wall Motion.   Thayer Headings, Brooke Bonito., MD, [facility] 05/28/2014, 4:29 PM [address],  [place] Pager [phone]

## 2014-11-09 ENCOUNTER — Encounter: Payer: Self-pay | Admitting: Cardiology

## 2014-11-09 ENCOUNTER — Ambulatory Visit (INDEPENDENT_AMBULATORY_CARE_PROVIDER_SITE_OTHER): Payer: Medicare Other | Admitting: Cardiology

## 2014-11-09 VITALS — BP 114/62 | HR 65 | Ht 67.0 in | Wt 193.0 lb

## 2014-11-09 DIAGNOSIS — Z954 Presence of other heart-valve replacement: Secondary | ICD-10-CM | POA: Diagnosis not present

## 2014-11-09 DIAGNOSIS — Z951 Presence of aortocoronary bypass graft: Secondary | ICD-10-CM | POA: Diagnosis not present

## 2014-11-09 DIAGNOSIS — Z952 Presence of prosthetic heart valve: Secondary | ICD-10-CM

## 2014-11-09 DIAGNOSIS — I119 Hypertensive heart disease without heart failure: Secondary | ICD-10-CM

## 2014-11-09 NOTE — Patient Instructions (Signed)
Medication Instructions:  Your physician recommends that you continue on your current medications as directed. Please refer to the Current Medication list given to you today.  Labwork: NONE  Testing/Procedures: NONE  Follow-Up: Your physician wants you to follow-up in: [place] will receive a reminder letter in the mail two months in advance. If you don't receive a letter, please call our office to schedule the follow-up appointment.

## 2014-11-09 NOTE — Progress Notes (Signed)
Cardiology Office Note   Date:  11/09/2014   ID:  Randy Chen, DOB 1928/01/20, MRN [id number]  PCP:  Gennette Pac, MD  Cardiologist: Darlin Coco MD  No chief complaint on file.     History of Present Illness: Randy Chen is a 81 y.o. male who presents for a six-month follow-up office visit.  This pleasant 81 year old gentleman is seen for a followup office visit. He has a history of known heart disease. He had coronary artery bypass graft surgery in 1987. He subsequently developed aortic stenosis and in 2006 underwent aortic valve replacement with a bioprosthetic valve. There is a past history of essential hypertension and hypercholesterolemia. The patient is retired and lives at [employer]. He enjoys going to the exercise classes 3 times a week. The patient had a two-dimensional echocardiogram on 12/03/12 which showed an ejection fraction of 55-60% and showed good function of his aortic valve bioprosthetic replacement.  In December 2015 the patient had some chest discomfort during the night.  We followed up with a Myoview stress test on 05/30/15 which showed no reversible ischemia and normal ejection fraction at 70%.  The patient has had no subsequent chest discomfort. The patient continues to exercise on a regular basis by doing water aerobics. He mentioned that he is no longer on Lupron shots from his urologist Dr. Junious Silk.  They are merely watching his prostate cancer situation from year to year.  Past Medical History  Diagnosis Date  . Hypertension   . Hyperlipidemia   . Coronary artery disease     CABG x5 - 1987  . History of atherosclerotic cardiovascular disease   . Aortic stenosis   . History of prostatectomy     Past Surgical History  Procedure Laterality Date  . Cardiac valve replacement  2006  . Cardiac catheterization      EF of 66%  . Coronary artery bypass graft  1987    x5     Current Outpatient Prescriptions  Medication Sig  Dispense Refill  . amLODipine (NORVASC) 10 MG tablet Take 1 tablet (10 mg total) by mouth daily. 90 tablet 3  . aspirin EC 81 MG tablet Take 81 mg by mouth every evening.    [name] atorvastatin (LIPITOR) 80 MG tablet Take 0.5 tablets (40 mg total) by mouth daily. 90 tablet 1  . Calcium Carb-Cholecalciferol (CALCIUM 1000 + D PO) Take 1,000 mg by mouth daily.    . Cholecalciferol (VITAMIN D) 2000 UNITS CAPS Take 2,000 Units by mouth daily.    . clopidogrel (PLAVIX) 75 MG tablet Take 1 tablet (75 mg total) by mouth daily. 90 tablet 3  . folic acid (FOLVITE) 335 MCG tablet Take 400 mcg by mouth daily.     . Glucosamine-Chondroit-Vit C-Mn (GLUCOSAMINE 1500 COMPLEX PO) Take 1,500 mg by mouth daily.    [name] lisinopril (PRINIVIL,ZESTRIL) 40 MG tablet Take 80 mg by mouth daily.     . meclizine (ANTIVERT) 25 MG tablet Take 1 tablet (25 mg total) by mouth 3 (three) times daily as needed for dizziness. 90 tablet 3  . metoprolol succinate (TOPROL-XL) 50 MG 24 hr tablet Take 1 tablet (50 mg total) by mouth daily. 90 tablet 3  . Multiple Vitamin (MULTIVITAMIN WITH MINERALS) TABS Take 1 tablet by mouth daily. Theragran    . nitroGLYCERIN (NITROSTAT) 0.4 MG SL tablet Place 1 tablet (0.4 mg total) under the tongue every 5 (five) minutes as needed for chest pain. 100 tablet 3  . vitamin C (  ASCORBIC ACID) 500 MG tablet Take 500 mg by mouth daily.     No current facility-administered medications for this visit.    Allergies:   Review of patient's allergies indicates no known allergies.    Social History:  The patient  reports that he quit smoking about 59 years ago. He does not have any smokeless tobacco history on file. He reports that he does not drink alcohol or use illicit drugs.   Family History:  The patient's family history includes Congestive Heart Failure in his sister and sister; Heart attack in his brother; Heart disease in his father; Heart failure in his mother.    ROS:  Please see the history of present  illness.   Otherwise, review of systems are positive for none.   All other systems are reviewed and negative.    PHYSICAL EXAM: VS:  BP 114/62 mmHg  Pulse 65  Ht 5\' 7"  (1.702 m)  Wt 193 lb (87.544 kg)  BMI 30.22 kg/m2 , BMI Body mass index is 30.22 kg/(m^2). GEN: Well nourished, well developed, in no acute distress HEENT: normal Neck: no JVD, carotid bruits, or masses Cardiac: RRR; no murmurs, rubs, or gallops,no edema  Respiratory:  clear to auscultation bilaterally, normal work of breathing GI: soft, nontender, nondistended, + BS MS: no deformity or atrophy Skin: warm and dry, no rash Neuro:  Strength and sensation are intact Psych: euthymic mood, full affect   EKG:  EKG is not ordered today.   Recent Labs: No results found for requested labs within last 365 days.    Lipid Panel    Component Value Date/Time   CHOL 146 01/31/2012 1007   TRIG 125.0 01/31/2012 1007   HDL 54.50 01/31/2012 1007   CHOLHDL 3 01/31/2012 1007   VLDL 25.0 01/31/2012 1007   LDLCALC 67 01/31/2012 1007      Wt Readings from Last 3 Encounters:  11/09/14 193 lb (87.544 kg)  05/28/14 192 lb (87.091 kg)  05/13/14 200 lb (90.719 kg)        ASSESSMENT AND PLAN:  1. Ischemic heart disease status post CABG in 1987.  Myoview stress test in December 2015 showed no reversible ischemia. 2. Status post bioprosthetic aortic valve replacement in 2006.  Echocardiogram in 2014 showed good prosthetic valve function. 3. Essential hypertension 4. Hypercholesterolemia followed by Dr. [name] 5. History of prostate cancer followed by Dr. Junious Silk  Disposition: Continue current medication.  [place] office visit and EKG in 6 months  Current medicines are reviewed at length with the patient today.  The patient does not have concerns regarding medicines.  The following changes have been made:  no change  Labs/ tests ordered today include:  No orders of the defined types were placed in this  encounter.       Berna Spare MD 11/09/2014 1:19 PM    [facility] [place], [place], [place]  [address] Phone: [phone]; Fax: [phone]

## 2014-12-14 ENCOUNTER — Other Ambulatory Visit: Payer: Self-pay

## 2015-01-08 ENCOUNTER — Inpatient Hospital Stay (HOSPITAL_BASED_OUTPATIENT_CLINIC_OR_DEPARTMENT_OTHER)
Admission: EM | Admit: 2015-01-08 | Discharge: 2015-01-11 | DRG: 464 | Disposition: A | Discharge status: Skilled Nursing Facility | Payer: Medicare Other | Attending: Internal Medicine | Admitting: Internal Medicine

## 2015-01-08 ENCOUNTER — Inpatient Hospital Stay (HOSPITAL_COMMUNITY): Payer: Medicare Other

## 2015-01-08 ENCOUNTER — Encounter (HOSPITAL_BASED_OUTPATIENT_CLINIC_OR_DEPARTMENT_OTHER): Payer: Self-pay

## 2015-01-08 DIAGNOSIS — Z953 Presence of xenogenic heart valve: Secondary | ICD-10-CM

## 2015-01-08 DIAGNOSIS — Z951 Presence of aortocoronary bypass graft: Secondary | ICD-10-CM

## 2015-01-08 DIAGNOSIS — Z952 Presence of prosthetic heart valve: Secondary | ICD-10-CM

## 2015-01-08 DIAGNOSIS — I10 Essential (primary) hypertension: Secondary | ICD-10-CM | POA: Diagnosis present

## 2015-01-08 DIAGNOSIS — L0390 Cellulitis, unspecified: Secondary | ICD-10-CM | POA: Insufficient documentation

## 2015-01-08 DIAGNOSIS — M8610 Other acute osteomyelitis, unspecified site: Secondary | ICD-10-CM | POA: Diagnosis present

## 2015-01-08 DIAGNOSIS — Z8546 Personal history of malignant neoplasm of prostate: Secondary | ICD-10-CM

## 2015-01-08 DIAGNOSIS — Z87891 Personal history of nicotine dependence: Secondary | ICD-10-CM | POA: Diagnosis not present

## 2015-01-08 DIAGNOSIS — Z7902 Long term (current) use of antithrombotics/antiplatelets: Secondary | ICD-10-CM | POA: Diagnosis not present

## 2015-01-08 DIAGNOSIS — Z7982 Long term (current) use of aspirin: Secondary | ICD-10-CM | POA: Diagnosis not present

## 2015-01-08 DIAGNOSIS — I2510 Atherosclerotic heart disease of native coronary artery without angina pectoris: Secondary | ICD-10-CM | POA: Diagnosis present

## 2015-01-08 DIAGNOSIS — IMO0002 Reserved for concepts with insufficient information to code with codable children: Secondary | ICD-10-CM

## 2015-01-08 DIAGNOSIS — L03116 Cellulitis of left lower limb: Secondary | ICD-10-CM | POA: Diagnosis present

## 2015-01-08 DIAGNOSIS — Z954 Presence of other heart-valve replacement: Secondary | ICD-10-CM | POA: Diagnosis not present

## 2015-01-08 DIAGNOSIS — E785 Hyperlipidemia, unspecified: Secondary | ICD-10-CM | POA: Diagnosis present

## 2015-01-08 DIAGNOSIS — W19XXXA Unspecified fall, initial encounter: Secondary | ICD-10-CM

## 2015-01-08 DIAGNOSIS — R609 Edema, unspecified: Secondary | ICD-10-CM

## 2015-01-08 LAB — CBC WITH DIFFERENTIAL/PLATELET
BASOS ABS: 0 10*3/uL (ref 0.0–0.1)
Basophils Relative: 1 % (ref 0–1)
EOS PCT: 1 % (ref 0–5)
Eosinophils Absolute: 0.1 10*3/uL (ref 0.0–0.7)
HCT: 29.8 % — ABNORMAL LOW (ref 39.0–52.0)
HEMOGLOBIN: 9.7 g/dL — AB (ref 13.0–17.0)
LYMPHS ABS: 1.4 10*3/uL (ref 0.7–4.0)
LYMPHS PCT: 22 % (ref 12–46)
MCH: 30.8 pg (ref 26.0–34.0)
MCHC: 32.6 g/dL (ref 30.0–36.0)
MCV: 94.6 fL (ref 78.0–100.0)
Monocytes Absolute: 0.8 10*3/uL (ref 0.1–1.0)
Monocytes Relative: 13 % — ABNORMAL HIGH (ref 3–12)
Neutro Abs: 4 10*3/uL (ref 1.7–7.7)
Neutrophils Relative %: 63 % (ref 43–77)
Platelets: 268 10*3/uL (ref 150–400)
RBC: 3.15 MIL/uL — ABNORMAL LOW (ref 4.22–5.81)
RDW: 13.1 % (ref 11.5–15.5)
WBC: 6.3 10*3/uL (ref 4.0–10.5)

## 2015-01-08 LAB — BASIC METABOLIC PANEL
Anion gap: 8 (ref 5–15)
BUN: 27 mg/dL — AB (ref 6–20)
CO2: 25 mmol/L (ref 22–32)
CREATININE: 1.36 mg/dL — AB (ref 0.61–1.24)
Calcium: 8.5 mg/dL — ABNORMAL LOW (ref 8.9–10.3)
Chloride: 102 mmol/L (ref 101–111)
GFR calc Af Amer: 53 mL/min — ABNORMAL LOW (ref >60)
GFR calc non Af Amer: 46 mL/min — ABNORMAL LOW (ref >60)
GLUCOSE: 120 mg/dL — AB (ref 65–99)
POTASSIUM: 4 mmol/L (ref 3.5–5.1)
Sodium: 135 mmol/L (ref 135–145)

## 2015-01-08 LAB — CK: Total CK: 57 U/L (ref 49–397)

## 2015-01-08 LAB — C-REACTIVE PROTEIN: CRP: 2.3 mg/dL — ABNORMAL HIGH (ref <1.0)

## 2015-01-08 MED ORDER — VANCOMYCIN HCL IN DEXTROSE 1-5 GM/200ML-% IV SOLN
1000.0000 mg | Freq: Once | INTRAVENOUS | Status: AC
  Administered 2015-01-08: 1000 mg via INTRAVENOUS
  Filled 2015-01-08: qty 200

## 2015-01-08 MED ORDER — ACETAMINOPHEN 325 MG PO TABS: 650.0000 mg | ORAL_TABLET | Freq: Four times a day (QID) | ORAL | Status: DC | PRN

## 2015-01-08 MED ORDER — ACETAMINOPHEN 650 MG RE SUPP: 650.0000 mg | Freq: Four times a day (QID) | RECTAL | Status: DC | PRN

## 2015-01-08 MED ORDER — ONDANSETRON HCL 4 MG PO TABS: 4.0000 mg | ORAL_TABLET | Freq: Four times a day (QID) | ORAL | Status: DC | PRN

## 2015-01-08 MED ORDER — METOPROLOL SUCCINATE ER 50 MG PO TB24
50.0000 mg | ORAL_TABLET | Freq: Every day | ORAL | Status: DC
  Administered 2015-01-09 – 2015-01-11 (×3): 50 mg via ORAL
  Filled 2015-01-08 (×5): qty 1

## 2015-01-08 MED ORDER — VANCOMYCIN HCL IN DEXTROSE 1-5 GM/200ML-% IV SOLN
1000.0000 mg | INTRAVENOUS | Status: DC
Start: 2015-01-09 — End: 2015-01-09
  Filled 2015-01-08: qty 200

## 2015-01-08 MED ORDER — ATORVASTATIN CALCIUM 40 MG PO TABS
40.0000 mg | ORAL_TABLET | Freq: Every day | ORAL | Status: DC
Start: 2015-01-09 — End: 2015-01-11
  Administered 2015-01-09 – 2015-01-11 (×3): 40 mg via ORAL
  Filled 2015-01-08 (×4): qty 1

## 2015-01-08 MED ORDER — CLOPIDOGREL BISULFATE 75 MG PO TABS
75.0000 mg | ORAL_TABLET | Freq: Every day | ORAL | Status: DC
  Filled 2015-01-08: qty 1

## 2015-01-08 MED ORDER — HYDROCODONE-ACETAMINOPHEN 5-325 MG PO TABS
1.0000 | ORAL_TABLET | ORAL | Status: DC | PRN
  Administered 2015-01-10 (×2): 2 via ORAL
  Administered 2015-01-11: 1 via ORAL
  Filled 2015-01-08: qty 1
  Filled 2015-01-08 (×2): qty 2
  Filled 2015-01-08: qty 1

## 2015-01-08 MED ORDER — HEPARIN SODIUM (PORCINE) 5000 UNIT/ML IJ SOLN
5000.0000 [IU] | Freq: Three times a day (TID) | INTRAMUSCULAR | Status: DC
  Administered 2015-01-08 – 2015-01-11 (×9): 5000 [IU] via SUBCUTANEOUS
  Filled 2015-01-08 (×8): qty 1

## 2015-01-08 MED ORDER — ASPIRIN EC 81 MG PO TBEC
81.0000 mg | DELAYED_RELEASE_TABLET | Freq: Every evening | ORAL | Status: DC
  Administered 2015-01-08 – 2015-01-10 (×3): 81 mg via ORAL
  Filled 2015-01-08 (×4): qty 1

## 2015-01-08 MED ORDER — ONDANSETRON HCL 4 MG/2ML IJ SOLN: 4.0000 mg | Freq: Four times a day (QID) | INTRAMUSCULAR | Status: DC | PRN

## 2015-01-08 MED ORDER — AMLODIPINE BESYLATE 10 MG PO TABS
10.0000 mg | ORAL_TABLET | Freq: Every day | ORAL | Status: DC
  Administered 2015-01-08 – 2015-01-11 (×4): 10 mg via ORAL
  Filled 2015-01-08 (×4): qty 1

## 2015-01-08 NOTE — Progress Notes (Signed)
BP 122/68 mmHg  Pulse 96  Temp(Src) 98.5 F (36.9 C) (Oral)  Resp 18  Ht 5\' 7"  (1.702 m)  Wt 86.183 kg (190 lb)  BMI 29.75 kg/m2  SpO31 69%  81 year old with past medical history of coronary artery disease status post bypass with vein grafting in 1987, essential hypertension aortic stenosis that comes in left lytic wound. He relates he was traveling from the [place] area report on July 5 where he cut the medial side of his left leg above the malleolus went to the emergency room and had a hard time controlling the bleed was put on Keflex for 2 weeks. Continue his traveling to [place] in his leg kept getting worse with erythema and tender to touch. He estimates in her [place] because of erythema redness and open wound (Cipro pictures by Dr Stark Jock). He relates he has remained afebrile and does not have any fevers on the visit to the ED no leukocytosis. He was started on empiric IV vancomycin and blood cultures were ordered. Orthopedic surgery was consulted for possible debridement. He will go to MedSurg floor.

## 2015-01-08 NOTE — ED Notes (Signed)
Left LE wound from fall 7/5-requesting recheck of wound-was seen in [place] for initial injury-was then in [place]-returned today

## 2015-01-08 NOTE — ED Provider Notes (Signed)
CSN: [id number]     Arrival date & time 01/08/15  1531 History   First MD Initiated Contact with Patient 01/08/15 1547     Chief Complaint  Patient presents with  . Wound Check     (Consider location/radiation/quality/duration/timing/severity/associated sxs/prior Treatment) HPI Comments: Patient is an 81 year old male with past medical history of coronary artery disease, status post coronary artery bypass in the past. He presents today for evaluation of a left leg wound. He reports to me that he fell on an escalator on July 5 in [place] while attempting to board a plane to [place]. He states that he caused an abrasion to the leg which resulted in arterial bleeding. The physician in the emergency room there apparently used some sort of quick clot to get the bleeding stopped. He was treated with keflex, then went on his trip.  His wound has been becoming larger and more red since this time. He presents here for evaluation of this. He denies any fevers or chills. Reports some yellowish drainage.  Patient is a 81 y.o. male presenting with wound check. The history is provided by the patient.  Wound Check This is a new problem. Episode onset: 2-1/2 weeks ago. The problem occurs constantly. The problem has been gradually worsening. The symptoms are aggravated by walking. Nothing relieves the symptoms. Treatments tried: Keflex. The treatment provided no relief.    Past Medical History  Diagnosis Date  . Hypertension   . Hyperlipidemia   . Coronary artery disease     CABG x5 - 1987  . History of atherosclerotic cardiovascular disease   . Aortic stenosis   . History of prostatectomy    Past Surgical History  Procedure Laterality Date  . Cardiac valve replacement  2006  . Cardiac catheterization      EF of 66%  . Coronary artery bypass graft  1987    x5   Family History  Problem Relation Age of Onset  . Heart disease Father   . Heart failure Mother   . Heart attack Brother   . Congestive  Heart Failure Sister   . Congestive Heart Failure Sister    History  Substance Use Topics  . Smoking status: Former Smoker    Quit date: 11/18/1955  . Smokeless tobacco: Not on file  . Alcohol Use: No    Review of Systems  All other systems reviewed and are negative.     Allergies  Review of patient's allergies indicates no known allergies.  Home Medications   Prior to Admission medications   Medication Sig Start Date End Date Taking? Authorizing Provider  amLODipine (NORVASC) 10 MG tablet Take 1 tablet (10 mg total) by mouth daily. 05/04/14   Brett Canales, PA-C  aspirin EC 81 MG tablet Take 81 mg by mouth every evening.    Historical Provider, MD  atorvastatin (LIPITOR) 80 MG tablet Take 0.5 tablets (40 mg total) by mouth daily. 05/04/14   Darlin Coco, MD  Calcium Carb-Cholecalciferol (CALCIUM 1000 + D PO) Take 1,000 mg by mouth daily.    Historical Provider, MD  Cholecalciferol (VITAMIN D) 2000 UNITS CAPS Take 2,000 Units by mouth daily.    Historical Provider, MD  clopidogrel (PLAVIX) 75 MG tablet Take 1 tablet (75 mg total) by mouth daily. 05/04/14   Brett Canales, PA-C  folic acid (FOLVITE) 662 MCG tablet Take 400 mcg by mouth daily.     Historical Provider, MD  Glucosamine-Chondroit-Vit C-Mn (GLUCOSAMINE 1500 COMPLEX PO) Take 1,500 mg by  mouth daily.    Historical Provider, MD  lisinopril (PRINIVIL,ZESTRIL) 40 MG tablet Take 80 mg by mouth daily.     Historical Provider, MD  meclizine (ANTIVERT) 25 MG tablet Take 1 tablet (25 mg total) by mouth 3 (three) times daily as needed for dizziness. 05/13/13   Darlin Coco, MD  metoprolol succinate (TOPROL-XL) 50 MG 24 hr tablet Take 1 tablet (50 mg total) by mouth daily. 05/04/14   Brett Canales, PA-C  Multiple Vitamin (MULTIVITAMIN WITH MINERALS) TABS Take 1 tablet by mouth daily. [place]    Historical Provider, MD  nitroGLYCERIN (NITROSTAT) 0.4 MG SL tablet Place 1 tablet (0.4 mg total) under the tongue every 5 (five)  minutes as needed for chest pain. 05/13/13   Darlin Coco, MD  vitamin C (ASCORBIC ACID) 500 MG tablet Take 500 mg by mouth daily.    Historical Provider, MD   BP 122/68 mmHg  Pulse 96  Temp(Src) 98.5 F (36.9 C) (Oral)  Resp 18  Ht 5\' 7"  (1.702 m)  Wt 190 lb (86.183 kg)  BMI 29.75 kg/m2  SpO2 96% Physical Exam  Constitutional: He is oriented to person, place, and time. He appears well-developed and well-nourished. No distress.  HENT:  Head: Normocephalic and atraumatic.  Neck: Normal range of motion. Neck supple.  Musculoskeletal: Normal range of motion. He exhibits no edema.  The left lower leg is noted to have a 3 cm round area of tissue which appears necrotic.  There is surrounding erythema and edema extending down into the foot and towards the lower calf. DP and PT pulses are easily palpable.  Neurological: He is alert and oriented to person, place, and time.  Skin: Skin is warm and dry. He is not diaphoretic.  Nursing note and vitals reviewed.   ED Course  Procedures (including critical care time) Labs Review Labs Reviewed - No data to display  Imaging Review No results found.   EKG Interpretation None          MDM   Final diagnoses:  None    Will admit for iv antibiotics, wound care.    Veryl Speak, MD 01/08/15 1910

## 2015-01-08 NOTE — H&P (Signed)
[facility] History and Physical  Patient: Randy Chen  MRN: [id number]  DOB: 06-Feb-1928  DOS: the patient was seen and examined on 01/08/2015 PCP: Gennette Pac, MD  Referring physician: Dr. Stark Jock Chief Complaint: Nonhealing wound  HPI: Randy Chen is a 81 y.o. male with Past medical history of hypertension, dyslipidemia, coronary artery disease status post CABG, aortic stenosis status post bioprosthetic aortic valve replacement. The patient is presenting with complaints off nonhealing wound. The patient was seen at [facility] on 12/22/2014 for injury to the left foot. Patient was at [facility] airport and wide on escalator suddenly lost his balance and fell few steps. After the fall he sustained injury to his left ankle as well as right side of the chest as well as left side of the shoulder. Patient mentions he had multiple x-rays at that point and also found to have arterial bleeding on the left foot. The patient's foot was dressed with the tissue glue and patient was discharged back on Keflex. Patient went to [place] and after completing history he came back home today and came to the urgent care for further evaluation. Patient mentions he has been taking the Keflex and a regular basis and also mentions that he has been doing dressing on a regular basis initially twice a day and later on once a day. Patient also mentions he has been avoiding walking significant on his left foot. Although the left foot has become more red and he has been so painful while ambulating. He denies any fever or chills chest pain shortness of breath diarrhea constipation burning urination headache or any other focal deficit. He mentions he has been given tetanus shot with his last ER visit.  The patient is coming from home.  At his baseline ambulates without any support And is independent for most of his ADL manages her medication on his own.  Review of Systems: as mentioned in the  history of present illness.  A comprehensive review of the other systems is negative.  Past Medical History  Diagnosis Date  . Hypertension   . Hyperlipidemia   . Coronary artery disease     CABG x5 - 1987  . History of atherosclerotic cardiovascular disease   . Aortic stenosis   . History of prostatectomy    Past Surgical History  Procedure Laterality Date  . Cardiac valve replacement  2006  . Cardiac catheterization      EF of 66%  . Coronary artery bypass graft  1987    x5   Social History:  reports that he quit smoking about 59 years ago. He does not have any smokeless tobacco history on file. He reports that he does not drink alcohol or use illicit drugs.  No Known Allergies  Family History  Problem Relation Age of Onset  . Heart disease Father   . Heart failure Mother   . Heart attack Brother   . Congestive Heart Failure Sister   . Congestive Heart Failure Sister     Prior to Admission medications   Medication Sig Start Date End Date Taking? Authorizing Provider  amLODipine (NORVASC) 10 MG tablet Take 1 tablet (10 mg total) by mouth daily. 05/04/14  Yes Brett Canales, PA-C  aspirin EC 81 MG tablet Take 81 mg by mouth every evening.   Yes Historical Provider, MD  atorvastatin (LIPITOR) 80 MG tablet Take 0.5 tablets (40 mg total) by mouth daily. 05/04/14  Yes Darlin Coco, MD  clopidogrel (PLAVIX) 75 MG tablet  Take 1 tablet (75 mg total) by mouth daily. 05/04/14  Yes Brett Canales, PA-C  lisinopril (PRINIVIL,ZESTRIL) 40 MG tablet Take 80 mg by mouth daily.    Yes Historical Provider, MD  metoprolol succinate (TOPROL-XL) 50 MG 24 hr tablet Take 1 tablet (50 mg total) by mouth daily. 05/04/14  Yes Brett Canales, PA-C  Calcium Carb-Cholecalciferol (CALCIUM 1000 + D PO) Take 1,000 mg by mouth daily.    Historical Provider, MD  Cholecalciferol (VITAMIN D) 2000 UNITS CAPS Take 2,000 Units by mouth daily.    Historical Provider, MD  folic acid (FOLVITE) 364 MCG tablet  Take 400 mcg by mouth daily.     Historical Provider, MD  Glucosamine-Chondroit-Vit C-Mn (GLUCOSAMINE 1500 COMPLEX PO) Take 1,500 mg by mouth daily.    Historical Provider, MD  meclizine (ANTIVERT) 25 MG tablet Take 1 tablet (25 mg total) by mouth 3 (three) times daily as needed for dizziness. 05/13/13   Darlin Coco, MD  Multiple Vitamin (MULTIVITAMIN WITH MINERALS) TABS Take 1 tablet by mouth daily. [place]    Historical Provider, MD  nitroGLYCERIN (NITROSTAT) 0.4 MG SL tablet Place 1 tablet (0.4 mg total) under the tongue every 5 (five) minutes as needed for chest pain. 05/13/13   Darlin Coco, MD  vitamin C (ASCORBIC ACID) 500 MG tablet Take 500 mg by mouth daily.    Historical Provider, MD    Physical Exam: Filed Vitals:   01/08/15 1900 01/08/15 1930 01/08/15 2000 01/08/15 2055  BP: 149/68 119/66 113/79 153/70  Pulse: 98 90 90 96  Temp:    98.4 F (36.9 C)  TempSrc:    Oral  Resp:    19  Height:    '5\' 7"'  (1.702 m)  Weight:    85.775 kg (189 lb 1.6 oz)  SpO2: 97% 93% 98% 99%    General: Alert, Awake and Oriented to Time, Place and Person. Appear in mild distress Eyes: PERRL ENT: Oral Mucosa clear moist. Neck: no JVD Cardiovascular: S1 and S2 Present, no Murmur, Peripheral Pulses Present Respiratory: Bilateral Air entry equal and Decreased,  Clear to Auscultation, no Crackles, no wheezes Abdomen: Bowel Sound present, Soft and non tender Skin: The wound was dressed at the time of my evaluation. Based on the pictures there is a nonhealing wound on the medial side of the left malleolus. Redness and edema is involving the left leg.  Extremities: Left leg Pedal edema, no calf tenderness Neurologic: Grossly no focal neuro deficit.  Labs on Admission:  CBC:  Recent Labs Lab 01/08/15 1645  WBC 6.3  NEUTROABS 4.0  HGB 9.7*  HCT 29.8*  MCV 94.6  PLT 268    CMP     Component Value Date/Time   NA 135 01/08/2015 1645   K 4.0 01/08/2015 1645   CL 102 01/08/2015  1645   CO2 25 01/08/2015 1645   GLUCOSE 120* 01/08/2015 1645   BUN 27* 01/08/2015 1645   CREATININE 1.36* 01/08/2015 1645   CALCIUM 8.5* 01/08/2015 1645   PROT 6.5 06/24/2012 0745   ALBUMIN 3.6 06/24/2012 0745   AST 21 06/24/2012 0745   ALT 14 06/24/2012 0745   ALKPHOS 64 06/24/2012 0745   BILITOT 0.5 06/24/2012 0745   GFRNONAA 46* 01/08/2015 1645   GFRAA 53* 01/08/2015 1645   Radiological Exams on Admission: Dg Foot Complete Left  01/09/2015   CLINICAL DATA:  81 year old male with foot injury. Open wound on the medial ankle.  EXAM: LEFT FOOT - COMPLETE 3+ VIEW  COMPARISON:  None.  FINDINGS: No acute fracture or dislocation. Osteopenia. No soft tissue gas or radiopaque foreign object identified.  IMPRESSION: No acute osseous pathology. MRI may provide better evaluation if there is high clinical suspicion for osteomyelitis.   Electronically Signed   By: Anner Crete M.D.   On: 01/09/2015 02:33    Assessment/Plan Principal Problem:   Cellulitis of left lower leg Active Problems:   Hx of CABG   History of prostate cancer   CAD (coronary artery disease)   S/P aortic valve replacement with bioprosthetic valve   1. Cellulitis of left lower leg The patient is presenting after a nonhealing left foot ulcer which he sustained as a trauma. He has completed a course of Keflex to as well as has been doing daily dressing. Although he appears to have infection associated with this nonhealing wound. He is currently being admitted in the hospital. I will give him IV hydration, IV vancomycin. Orthopedic has been consulted for wound debridement. We'll follow the recommendation. Checking ESR and CRP. Patient will remain nothing by mouth after midnight.  2. Coronary artery disease as well as CABG and aortic wall replacement. Continuing home medications aspirin and Plavix since the patient has taken it on a regular basis. Depending on orthopedic recommendation the Plavix may have to be  discontinued.  3. Essential hypertension. Blood pressure at present stable continue Toprol-XL and holding lisinopril for preop. Also continue amlodipine.  4. Dyslipidemia. Continuing Lipitor.  Advance goals of care discussion: Full code   Consults: ED discussed with Dr. Sharol Given from orthopedics.  DVT Prophylaxis: Mechanical compression Nutrition: Nothing by mouth except medication after midnight  Disposition: Admitted as inpatient,med-surge unit.  Author: Berle Mull, MD [facility] Pager: [phone] 01/08/2015  If 7PM-7AM, please contact night-coverage [email or web address] Password [email or web address]

## 2015-01-08 NOTE — Progress Notes (Signed)
Ortho called regarding leg cellulitis and wound.   I reviewed the pictures from [facility], and I think that admission and IV antibiotics is appropriate, I have also spoken with Dr. Sharol Given, who will evaluate the patient tomorrow morning. I would keep him nothing by mouth after midnight tonight, just in case he needs surgery tomorrow.  Johnny Bridge, MD

## 2015-01-08 NOTE — ED Notes (Signed)
Report given to [facility] on Randy Chen.

## 2015-01-08 NOTE — Progress Notes (Signed)
ANTIBIOTIC CONSULT NOTE - INITIAL  Pharmacy Consult for vancomycin  Indication: cellulitis   No Known Allergies  Patient Measurements: Height: 5\' 7"  (170.2 cm) Weight: 189 lb 1.6 oz (85.775 kg) IBW/kg (Calculated) : 66.1 Adjusted Body Weight: c  Vital Signs: Temp: 98.4 F (36.9 C) (07/22 2055) Temp Source: Oral (07/22 2055) BP: 153/70 mmHg (07/22 2055) Pulse Rate: 96 (07/22 2055) Intake/Output from previous day:   Intake/Output from this shift:    Labs:  Recent Labs  01/08/15 1645  WBC 6.3  HGB 9.7*  PLT 268  CREATININE 1.36*   Estimated Creatinine Clearance: 42.3 mL/min (by C-G formula based on Cr of 1.36). No results for input(s): VANCOTROUGH, VANCOPEAK, VANCORANDOM, GENTTROUGH, GENTPEAK, GENTRANDOM, TOBRATROUGH, TOBRAPEAK, TOBRARND, AMIKACINPEAK, AMIKACINTROU, AMIKACIN in the last 72 hours.   Microbiology: No results found for this or any previous visit (from the past 720 hour(s)).  Medical History: Past Medical History  Diagnosis Date  . Hypertension   . Hyperlipidemia   . Coronary artery disease     CABG x5 - 1987  . History of atherosclerotic cardiovascular disease   . Aortic stenosis   . History of prostatectomy     Medications:  Prescriptions prior to admission  Medication Sig Dispense Refill Last Dose  . amLODipine (NORVASC) 10 MG tablet Take 1 tablet (10 mg total) by mouth daily. 90 tablet 3 Taking  . aspirin EC 81 MG tablet Take 81 mg by mouth every evening.   Taking  . atorvastatin (LIPITOR) 80 MG tablet Take 0.5 tablets (40 mg total) by mouth daily. 90 tablet 1 Taking  . clopidogrel (PLAVIX) 75 MG tablet Take 1 tablet (75 mg total) by mouth daily. 90 tablet 3 Taking  . lisinopril (PRINIVIL,ZESTRIL) 40 MG tablet Take 80 mg by mouth daily.    Taking  . metoprolol succinate (TOPROL-XL) 50 MG 24 hr tablet Take 1 tablet (50 mg total) by mouth daily. 90 tablet 3 Taking  . Calcium Carb-Cholecalciferol (CALCIUM 1000 + D PO) Take 1,000 mg by mouth  daily.   Taking  . Cholecalciferol (VITAMIN D) 2000 UNITS CAPS Take 2,000 Units by mouth daily.   Taking  . folic acid (FOLVITE) 341 MCG tablet Take 400 mcg by mouth daily.    Taking  . Glucosamine-Chondroit-Vit C-Mn (GLUCOSAMINE 1500 COMPLEX PO) Take 1,500 mg by mouth daily.   Taking  . meclizine (ANTIVERT) 25 MG tablet Take 1 tablet (25 mg total) by mouth 3 (three) times daily as needed for dizziness. 90 tablet 3 Taking  . Multiple Vitamin (MULTIVITAMIN WITH MINERALS) TABS Take 1 tablet by mouth daily. Theragran   Taking  . nitroGLYCERIN (NITROSTAT) 0.4 MG SL tablet Place 1 tablet (0.4 mg total) under the tongue every 5 (five) minutes as needed for chest pain. 100 tablet 3 Taking  . vitamin C (ASCORBIC ACID) 500 MG tablet Take 500 mg by mouth daily.   Taking   Assessment: 81 yo male with cut in L leg above the knee, concerned for cellulitis. WBC 6.3, Tm wnl, BP up to 150/70, SCr 1.3, eCrCl 35-40 ml/min.[name] Received vancomycin 1g IV x1 in ED.   Goal of Therapy:  Vancomycin trough level 10-15 mcg/ml  Plan:  -Vancomycin 1g IV given in ED then 1g/24h -Monitor renal fx, cultures, VT at Css as needed  Harvel Quale  01/08/2015 9:44 PM

## 2015-01-08 NOTE — Progress Notes (Signed)
Patient list of phone numbers to family-  Randy Chen (dtg) [phone], Randy Chen (son n law ) [phone] , Randy Chen (son) [phone], Randy Chen [phone]    In case patient misplaces a card with phone numbers.

## 2015-01-09 ENCOUNTER — Inpatient Hospital Stay (HOSPITAL_COMMUNITY): Payer: Medicare Other

## 2015-01-09 DIAGNOSIS — Z954 Presence of other heart-valve replacement: Secondary | ICD-10-CM

## 2015-01-09 DIAGNOSIS — L03116 Cellulitis of left lower limb: Secondary | ICD-10-CM

## 2015-01-09 DIAGNOSIS — Z951 Presence of aortocoronary bypass graft: Secondary | ICD-10-CM

## 2015-01-09 LAB — COMPREHENSIVE METABOLIC PANEL
ALBUMIN: 3.2 g/dL — AB (ref 3.5–5.0)
ALK PHOS: 84 U/L (ref 38–126)
ALT: 18 U/L (ref 17–63)
AST: 22 U/L (ref 15–41)
Anion gap: 8 (ref 5–15)
BILIRUBIN TOTAL: 0.5 mg/dL (ref 0.3–1.2)
BUN: 17 mg/dL (ref 6–20)
CHLORIDE: 102 mmol/L (ref 101–111)
CO2: 26 mmol/L (ref 22–32)
Calcium: 8.5 mg/dL — ABNORMAL LOW (ref 8.9–10.3)
Creatinine, Ser: 1.31 mg/dL — ABNORMAL HIGH (ref 0.61–1.24)
GFR calc Af Amer: 56 mL/min — ABNORMAL LOW (ref >60)
GFR calc non Af Amer: 48 mL/min — ABNORMAL LOW (ref >60)
Glucose, Bld: 115 mg/dL — ABNORMAL HIGH (ref 65–99)
POTASSIUM: 4.2 mmol/L (ref 3.5–5.1)
Sodium: 136 mmol/L (ref 135–145)
Total Protein: 6.2 g/dL — ABNORMAL LOW (ref 6.5–8.1)

## 2015-01-09 LAB — CBC WITH DIFFERENTIAL/PLATELET
Basophils Absolute: 0 10*3/uL (ref 0.0–0.1)
Basophils Relative: 1 % (ref 0–1)
Eosinophils Absolute: 0.1 10*3/uL (ref 0.0–0.7)
Eosinophils Relative: 1 % (ref 0–5)
HEMATOCRIT: 28.1 % — AB (ref 39.0–52.0)
Hemoglobin: 9.8 g/dL — ABNORMAL LOW (ref 13.0–17.0)
LYMPHS ABS: 1.1 10*3/uL (ref 0.7–4.0)
Lymphocytes Relative: 18 % (ref 12–46)
MCH: 32.3 pg (ref 26.0–34.0)
MCHC: 34.9 g/dL (ref 30.0–36.0)
MCV: 92.7 fL (ref 78.0–100.0)
MONOS PCT: 10 % (ref 3–12)
Monocytes Absolute: 0.6 10*3/uL (ref 0.1–1.0)
Neutro Abs: 4.1 10*3/uL (ref 1.7–7.7)
Neutrophils Relative %: 70 % (ref 43–77)
PLATELETS: 263 10*3/uL (ref 150–400)
RBC: 3.03 MIL/uL — ABNORMAL LOW (ref 4.22–5.81)
RDW: 13.4 % (ref 11.5–15.5)
WBC: 5.8 10*3/uL (ref 4.0–10.5)

## 2015-01-09 LAB — URINALYSIS, ROUTINE W REFLEX MICROSCOPIC
BILIRUBIN URINE: NEGATIVE
GLUCOSE, UA: NEGATIVE mg/dL
Ketones, ur: NEGATIVE mg/dL
Leukocytes, UA: NEGATIVE
NITRITE: NEGATIVE
Protein, ur: NEGATIVE mg/dL
SPECIFIC GRAVITY, URINE: 1.012 (ref 1.005–1.030)
Urobilinogen, UA: 0.2 mg/dL (ref 0.0–1.0)
pH: 5.5 (ref 5.0–8.0)

## 2015-01-09 LAB — SURGICAL PCR SCREEN
MRSA, PCR: NEGATIVE
STAPHYLOCOCCUS AUREUS: NEGATIVE

## 2015-01-09 LAB — SEDIMENTATION RATE: SED RATE: 57 mm/h — AB (ref 0–16)

## 2015-01-09 LAB — URINE MICROSCOPIC-ADD ON: RBC / HPF: NONE SEEN RBC/hpf (ref <3)

## 2015-01-09 MED ORDER — CYCLOBENZAPRINE HCL 5 MG PO TABS: 5.0000 mg | ORAL_TABLET | Freq: Three times a day (TID) | ORAL | Status: DC | PRN

## 2015-01-09 MED ORDER — VANCOMYCIN HCL IN DEXTROSE 750-5 MG/150ML-% IV SOLN
750.0000 mg | Freq: Two times a day (BID) | INTRAVENOUS | Status: AC
  Administered 2015-01-09 – 2015-01-11 (×5): 750 mg via INTRAVENOUS
  Filled 2015-01-09 (×7): qty 150

## 2015-01-09 NOTE — Progress Notes (Signed)
ANTIBIOTIC CONSULT NOTE - FOLLOW UP  Pharmacy Consult for vancomycin  Indication: cellulitis with osteo   No Known Allergies  Patient Measurements: Height: 5\' 7"  (170.2 cm) Weight: 189 lb 1.6 oz (85.775 kg) IBW/kg (Calculated) : 66.1  Vital Signs: Temp: 98.4 F (36.9 C) (07/23 0520) Temp Source: Oral (07/23 0520) BP: 125/60 mmHg (07/23 0520) Pulse Rate: 85 (07/23 0520) Intake/Output from previous day: 07/22 0701 - 07/23 0700 In: 200 [IV Piggyback:200] Out: 800 [Urine:800] Intake/Output from this shift: Total I/O In: 0  Out: 300 [Urine:300]  Labs:  Recent Labs  01/08/15 1645 01/09/15 0630  WBC 6.3 5.8  HGB 9.7* 9.8*  PLT 268 263  CREATININE 1.36* 1.31*   Estimated Creatinine Clearance: 43.9 mL/min (by C-G formula based on Cr of 1.31). No results for input(s): VANCOTROUGH, VANCOPEAK, VANCORANDOM, GENTTROUGH, GENTPEAK, GENTRANDOM, TOBRATROUGH, TOBRAPEAK, TOBRARND, AMIKACINPEAK, AMIKACINTROU, AMIKACIN in the last 72 hours.   Microbiology: No results found for this or any previous visit (from the past 720 hour(s)).  Medical History: Past Medical History  Diagnosis Date  . Hypertension   . Hyperlipidemia   . Coronary artery disease     CABG x5 - 1987  . History of atherosclerotic cardiovascular disease   . Aortic stenosis   . History of prostatectomy     Medications:  Prescriptions prior to admission  Medication Sig Dispense Refill Last Dose  . amLODipine (NORVASC) 10 MG tablet Take 1 tablet (10 mg total) by mouth daily. 90 tablet 3 Taking  . aspirin EC 81 MG tablet Take 81 mg by mouth every evening.   Taking  . atorvastatin (LIPITOR) 80 MG tablet Take 0.5 tablets (40 mg total) by mouth daily. 90 tablet 1 Taking  . clopidogrel (PLAVIX) 75 MG tablet Take 1 tablet (75 mg total) by mouth daily. 90 tablet 3 Taking  . lisinopril (PRINIVIL,ZESTRIL) 40 MG tablet Take 80 mg by mouth daily.    Taking  . metoprolol succinate (TOPROL-XL) 50 MG 24 hr tablet Take 1 tablet  (50 mg total) by mouth daily. 90 tablet 3 Taking  . Calcium Carb-Cholecalciferol (CALCIUM 1000 + D PO) Take 1,000 mg by mouth daily.   Taking  . Cholecalciferol (VITAMIN D) 2000 UNITS CAPS Take 2,000 Units by mouth daily.   Taking  . folic acid (FOLVITE) 962 MCG tablet Take 400 mcg by mouth daily.    Taking  . Glucosamine-Chondroit-Vit C-Mn (GLUCOSAMINE 1500 COMPLEX PO) Take 1,500 mg by mouth daily.   Taking  . meclizine (ANTIVERT) 25 MG tablet Take 1 tablet (25 mg total) by mouth 3 (three) times daily as needed for dizziness. 90 tablet 3 Taking  . Multiple Vitamin (MULTIVITAMIN WITH MINERALS) TABS Take 1 tablet by mouth daily. Theragran   Taking  . nitroGLYCERIN (NITROSTAT) 0.4 MG SL tablet Place 1 tablet (0.4 mg total) under the tongue every 5 (five) minutes as needed for chest pain. 100 tablet 3 Taking  . vitamin C (ASCORBIC ACID) 500 MG tablet Take 500 mg by mouth daily.   Taking   Assessment: 81 yo male with traumatic left ankle wound with acute osteomyelitis currently on empiric IV vancomycin. WBC wnl. Afebrile. Ortho planning surgery tomorrow followed by 6 weeks of IV antibiotics.   7/22 vanc >  7/22 BCx2>>   Goal of Therapy:  Vancomycin trough level 15-20 mcg/ml  Plan:  -Increase Vancomycin to 750 mg IV Q 12 hours  -Monitor renal fx, cultures, VT at Css as needed  Albertina Parr, PharmD., BCPS Clinical Pharmacist Pager  [phone]   
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
## 2015-01-09 NOTE — Anesthesia Preprocedure Evaluation (Addendum)
Anesthesia Evaluation  Patient identified by MRN, date of birth, ID band Patient awake    Reviewed: Allergy & Precautions, NPO status , Patient's Chart, lab work & pertinent test results  History of Anesthesia Complications Negative for: history of anesthetic complications  Airway Mallampati: II  TM Distance: >3 FB Neck ROM: Full    Dental  (+) Teeth Intact, Dental Advisory Given   Pulmonary former smoker,    Pulmonary exam normal       Cardiovascular hypertension, + CAD and + CABG Normal cardiovascular exam+ Systolic murmurs S/P AVR 38/7564 Nuc Med Study: Overall Impression: Intermediate risk stress nuclear study . There is evidence of ischemia in the mid/basal inferolateral wall. .  LV Ejection Fraction: 70%. LV Wall Motion: NL LV Function; NL Wall Motion.   Neuro/Psych negative neurological ROS  negative psych ROS   GI/Hepatic negative GI ROS, Neg liver ROS,   Endo/Other  negative endocrine ROS  Renal/GU Renal InsufficiencyRenal disease     Musculoskeletal   Abdominal   Peds  Hematology   Anesthesia Other Findings   Reproductive/Obstetrics                         Anesthesia Physical Anesthesia Plan  ASA: III  Anesthesia Plan: General   Post-op Pain Management:    Induction: Intravenous  Airway Management Planned: LMA  Additional Equipment:   Intra-op Plan:   Post-operative Plan: Extubation in OR  Informed Consent: I have reviewed the patients History and Physical, chart, labs and discussed the procedure including the risks, benefits and alternatives for the proposed anesthesia with the patient or authorized representative who has indicated his/her understanding and acceptance.   Dental advisory given  Plan Discussed with: CRNA, Anesthesiologist and Surgeon  Anesthesia Plan Comments:        Anesthesia Quick Evaluation

## 2015-01-09 NOTE — Progress Notes (Signed)
PROGRESS NOTE  Randy Chen MPN:[id number] DOB: 1928/05/02 DOA: 01/08/2015 PCP: Gennette Pac, MD  HPI/Recap of past 24 hours: No fever, no chest pain, left ankle in dressing, able to bear weight, c/o right leg cramp from the trip, no edema.  Assessment/Plan: Principal Problem:   Cellulitis of left lower leg Active Problems:   Hx of CABG   History of prostate cancer   CAD (coronary artery disease)   S/P aortic valve replacement with bioprosthetic valve  1. Cellulitis of left lower leg Presented with a nonhealing left foot ulcer which he sustained as a trauma during the trip to [place] this past two weeks He has completed a course of Keflex to as well as has been doing daily dressing PTA. IV vancomycin, Orthopedic has been consulted for wound debridement. OR 7/24. Ortho recommended IV vanc for 6 weeks after debridement.    2. Coronary artery disease as well as CABG and bio aortic valve replacement. Continuing home medications aspirin and Plavix since the patient has taken it on a regular basis. Depending on orthopedic recommendation the Plavix may have to be discontinued.  3. Essential hypertension. Blood pressure at present stable continue Toprol-XL and holding lisinopril for preop. Also continue amlodipine.  4. Dyslipidemia. Continuing Lipitor.  Code Status: full  Family Communication: patient and wife  Disposition Plan: home when medically stable   Consultants:  ortho  Procedures:  I/D left ankle wound 7/24  Antibiotics:  vanc from admission   Objective: BP 125/60 mmHg  Pulse 85  Temp(Src) 98.4 F (36.9 C) (Oral)  Resp 18  Ht 5\' 7"  (1.702 m)  Wt 85.775 kg (189 lb 1.6 oz)  BMI 29.61 kg/m2  SpO2 97%  Intake/Output Summary (Last 24 hours) at 01/09/15 1138 Last data filed at 01/09/15 0949  Gross per 24 hour  Intake    200 ml  Output   1400 ml  Net  -1200 ml   Filed Weights   01/08/15 1542 01/08/15 2055  Weight: 86.183 kg (190 lb)  85.775 kg (189 lb 1.6 oz)    Exam:   General:  NAD  Cardiovascular: RRR  Respiratory: CTABL  Abdomen: Soft/ND/NT, positive BS  Musculoskeletal: left ankle wound in dressing, otherwise unremarkable  Neuro: aaox3  Data Reviewed: Basic Metabolic Panel:  Recent Labs Lab 01/08/15 1645 01/09/15 0630  NA 135 136  K 4.0 4.2  CL 102 102  CO2 25 26  GLUCOSE 120* 115*  BUN 27* 17  CREATININE 1.36* 1.31*  CALCIUM 8.5* 8.5*   Liver Function Tests:  Recent Labs Lab 01/09/15 0630  AST 22  ALT 18  ALKPHOS 84  BILITOT 0.5  PROT 6.2*  ALBUMIN 3.2*   No results for input(s): LIPASE, AMYLASE in the last 168 hours. No results for input(s): AMMONIA in the last 168 hours. CBC:  Recent Labs Lab 01/08/15 1645 01/09/15 0630  WBC 6.3 5.8  NEUTROABS 4.0 4.1  HGB 9.7* 9.8*  HCT 29.8* 28.1*  MCV 94.6 92.7  PLT 268 263   Cardiac Enzymes:    Recent Labs Lab 01/08/15 2204  CKTOTAL 57   BNP (last 3 results) No results for input(s): BNP in the last 8760 hours.  ProBNP (last 3 results) No results for input(s): PROBNP in the last 8760 hours.  CBG: No results for input(s): GLUCAP in the last 168 hours.  No results found for this or any previous visit (from the past 240 hour(s)).   Studies: Dg Foot Complete Left  01/09/2015  CLINICAL DATA:  81 year old male with foot injury. Open wound on the medial ankle.  EXAM: LEFT FOOT - COMPLETE 3+ VIEW  COMPARISON:  None.  FINDINGS: No acute fracture or dislocation. Osteopenia. No soft tissue gas or radiopaque foreign object identified.  IMPRESSION: No acute osseous pathology. MRI may provide better evaluation if there is high clinical suspicion for osteomyelitis.   Electronically Signed   By: Anner Crete M.D.   On: 01/09/2015 02:33    Scheduled Meds: . amLODipine  10 mg Oral Daily  . aspirin EC  81 mg Oral QPM  . atorvastatin  40 mg Oral Daily  . heparin  5,000 Units Subcutaneous 3 times per day  . metoprolol succinate   50 mg Oral Daily  . vancomycin  750 mg Intravenous Q12H    Continuous Infusions:    Time spent: 28mins  Talaya Lamprecht MD, PhD  [facility] Pager [phone]. If 7PM-7AM, please contact night-coverage at [email or web address], password [facility] 01/09/2015, 11:38 AM  LOS: 1 day

## 2015-01-10 ENCOUNTER — Inpatient Hospital Stay (HOSPITAL_COMMUNITY): Payer: Medicare Other | Admitting: Anesthesiology

## 2015-01-10 ENCOUNTER — Encounter (HOSPITAL_COMMUNITY)
Admission: EM | Disposition: A | Discharge status: Skilled Nursing Facility | Payer: Self-pay | Source: Home / Self Care | Attending: Internal Medicine

## 2015-01-10 HISTORY — PX: I & D EXTREMITY: SHX5045

## 2015-01-10 LAB — CBC WITH DIFFERENTIAL/PLATELET
BASOS PCT: 0 % (ref 0–1)
Basophils Absolute: 0 10*3/uL (ref 0.0–0.1)
Eosinophils Absolute: 0.1 10*3/uL (ref 0.0–0.7)
Eosinophils Relative: 1 % (ref 0–5)
HCT: 28.1 % — ABNORMAL LOW (ref 39.0–52.0)
Hemoglobin: 9.3 g/dL — ABNORMAL LOW (ref 13.0–17.0)
LYMPHS PCT: 22 % (ref 12–46)
Lymphs Abs: 1.4 10*3/uL (ref 0.7–4.0)
MCH: 30.6 pg (ref 26.0–34.0)
MCHC: 33.1 g/dL (ref 30.0–36.0)
MCV: 92.4 fL (ref 78.0–100.0)
Monocytes Absolute: 0.6 10*3/uL (ref 0.1–1.0)
Monocytes Relative: 10 % (ref 3–12)
NEUTROS ABS: 4.2 10*3/uL (ref 1.7–7.7)
Neutrophils Relative %: 67 % (ref 43–77)
PLATELETS: 266 10*3/uL (ref 150–400)
RBC: 3.04 MIL/uL — ABNORMAL LOW (ref 4.22–5.81)
RDW: 13.4 % (ref 11.5–15.5)
WBC: 6.3 10*3/uL (ref 4.0–10.5)

## 2015-01-10 LAB — COMPREHENSIVE METABOLIC PANEL
ALBUMIN: 3.2 g/dL — AB (ref 3.5–5.0)
ALT: 20 U/L (ref 17–63)
ANION GAP: 6 (ref 5–15)
AST: 24 U/L (ref 15–41)
Alkaline Phosphatase: 87 U/L (ref 38–126)
BUN: 18 mg/dL (ref 6–20)
CALCIUM: 8.4 mg/dL — AB (ref 8.9–10.3)
CHLORIDE: 102 mmol/L (ref 101–111)
CO2: 27 mmol/L (ref 22–32)
Creatinine, Ser: 1.36 mg/dL — ABNORMAL HIGH (ref 0.61–1.24)
GFR calc Af Amer: 53 mL/min — ABNORMAL LOW (ref >60)
GFR calc non Af Amer: 46 mL/min — ABNORMAL LOW (ref >60)
Glucose, Bld: 117 mg/dL — ABNORMAL HIGH (ref 65–99)
Potassium: 4.1 mmol/L (ref 3.5–5.1)
Sodium: 135 mmol/L (ref 135–145)
TOTAL PROTEIN: 5.9 g/dL — AB (ref 6.5–8.1)
Total Bilirubin: 0.5 mg/dL (ref 0.3–1.2)

## 2015-01-10 LAB — TYPE AND SCREEN
ABO/RH(D): O NEG
ANTIBODY SCREEN: NEGATIVE

## 2015-01-10 LAB — ABO/RH: ABO/RH(D): O NEG

## 2015-01-10 LAB — PROTIME-INR
INR: 1.18 (ref 0.00–1.49)
Prothrombin Time: 15.2 seconds (ref 11.6–15.2)

## 2015-01-10 SURGERY — IRRIGATION AND DEBRIDEMENT EXTREMITY
Anesthesia: General | Site: Leg Lower | Laterality: Left

## 2015-01-10 MED ORDER — METHOCARBAMOL 500 MG PO TABS: 500.0000 mg | ORAL_TABLET | Freq: Four times a day (QID) | ORAL | Status: DC | PRN

## 2015-01-10 MED ORDER — CALCIUM CARBONATE ANTACID 500 MG PO CHEW
400.0000 mg | CHEWABLE_TABLET | Freq: Three times a day (TID) | ORAL | Status: DC | PRN
  Administered 2015-01-10: 400 mg via ORAL
  Filled 2015-01-10: qty 2

## 2015-01-10 MED ORDER — ACETAMINOPHEN 650 MG RE SUPP: 650.0000 mg | Freq: Four times a day (QID) | RECTAL | Status: DC | PRN

## 2015-01-10 MED ORDER — PROPOFOL 10 MG/ML IV BOLUS
INTRAVENOUS | Status: DC | PRN
  Administered 2015-01-10: 130 mg via INTRAVENOUS

## 2015-01-10 MED ORDER — SODIUM CHLORIDE 0.9 % IV SOLN
INTRAVENOUS | Status: DC
  Administered 2015-01-10: 1 mL via INTRAVENOUS
  Administered 2015-01-10: 14:00:00 via INTRAVENOUS

## 2015-01-10 MED ORDER — PROMETHAZINE HCL 25 MG/ML IJ SOLN: 6.2500 mg | INTRAMUSCULAR | Status: DC | PRN

## 2015-01-10 MED ORDER — LIDOCAINE HCL (CARDIAC) 20 MG/ML IV SOLN
INTRAVENOUS | Status: DC | PRN
  Administered 2015-01-10: 80 mg via INTRAVENOUS

## 2015-01-10 MED ORDER — LACTATED RINGERS IV SOLN
INTRAVENOUS | Status: DC | PRN
  Administered 2015-01-10: 08:00:00 via INTRAVENOUS

## 2015-01-10 MED ORDER — ONDANSETRON HCL 4 MG PO TABS: 4.0000 mg | ORAL_TABLET | Freq: Four times a day (QID) | ORAL | Status: DC | PRN

## 2015-01-10 MED ORDER — HYDROMORPHONE HCL 1 MG/ML IJ SOLN
0.2500 mg | INTRAMUSCULAR | Status: DC | PRN
  Administered 2015-01-10 (×4): 0.25 mg via INTRAVENOUS

## 2015-01-10 MED ORDER — ACETAMINOPHEN 325 MG PO TABS: 650.0000 mg | ORAL_TABLET | Freq: Four times a day (QID) | ORAL | Status: DC | PRN

## 2015-01-10 MED ORDER — VANCOMYCIN HCL IN DEXTROSE 1-5 GM/200ML-% IV SOLN
1000.0000 mg | INTRAVENOUS | Status: DC
  Administered 2015-01-10: 1000 mg via INTRAVENOUS
  Filled 2015-01-10: qty 200

## 2015-01-10 MED ORDER — PROPOFOL 10 MG/ML IV BOLUS
INTRAVENOUS | Status: AC
  Filled 2015-01-10: qty 20

## 2015-01-10 MED ORDER — HYDROMORPHONE HCL 1 MG/ML IJ SOLN
INTRAMUSCULAR | Status: AC
  Filled 2015-01-10: qty 1

## 2015-01-10 MED ORDER — LIDOCAINE HCL (CARDIAC) 20 MG/ML IV SOLN
INTRAVENOUS | Status: AC
  Filled 2015-01-10: qty 5

## 2015-01-10 MED ORDER — CALCIUM CARBONATE ANTACID 500 MG PO CHEW: 1.0000 | CHEWABLE_TABLET | Freq: Three times a day (TID) | ORAL | Status: DC | PRN

## 2015-01-10 MED ORDER — EPHEDRINE SULFATE 50 MG/ML IJ SOLN
INTRAMUSCULAR | Status: AC
  Filled 2015-01-10: qty 1

## 2015-01-10 MED ORDER — HYDROMORPHONE HCL 1 MG/ML IJ SOLN
1.0000 mg | INTRAMUSCULAR | Status: DC | PRN
  Administered 2015-01-10: 1 mg via INTRAVENOUS
  Filled 2015-01-10: qty 1

## 2015-01-10 MED ORDER — METOCLOPRAMIDE HCL 5 MG PO TABS: 5.0000 mg | ORAL_TABLET | Freq: Three times a day (TID) | ORAL | Status: DC | PRN

## 2015-01-10 MED ORDER — OXYCODONE HCL 5 MG PO TABS: 5.0000 mg | ORAL_TABLET | ORAL | Status: DC | PRN

## 2015-01-10 MED ORDER — VANCOMYCIN HCL 500 MG IV SOLR
INTRAVENOUS | Status: DC | PRN
  Administered 2015-01-10: 500 mg

## 2015-01-10 MED ORDER — SODIUM CHLORIDE 0.45 % IV SOLN
INTRAVENOUS | Status: DC
  Administered 2015-01-10: 1 mL via INTRAVENOUS

## 2015-01-10 MED ORDER — GENTAMICIN SULFATE 40 MG/ML IJ SOLN
INTRAMUSCULAR | Status: DC | PRN
  Administered 2015-01-10 (×2): 80 mg via INTRAMUSCULAR

## 2015-01-10 MED ORDER — METOCLOPRAMIDE HCL 5 MG/ML IJ SOLN: 5.0000 mg | Freq: Three times a day (TID) | INTRAMUSCULAR | Status: DC | PRN

## 2015-01-10 MED ORDER — GENTAMICIN SULFATE 40 MG/ML IJ SOLN
INTRAMUSCULAR | Status: AC
  Filled 2015-01-10: qty 4

## 2015-01-10 MED ORDER — VANCOMYCIN HCL 500 MG IV SOLR
INTRAVENOUS | Status: AC
  Filled 2015-01-10: qty 500

## 2015-01-10 MED ORDER — FENTANYL CITRATE (PF) 250 MCG/5ML IJ SOLN
INTRAMUSCULAR | Status: AC
  Filled 2015-01-10: qty 5

## 2015-01-10 MED ORDER — EPHEDRINE SULFATE 50 MG/ML IJ SOLN
INTRAMUSCULAR | Status: DC | PRN
  Administered 2015-01-10: 10 mg via INTRAVENOUS

## 2015-01-10 MED ORDER — FENTANYL CITRATE (PF) 250 MCG/5ML IJ SOLN
INTRAMUSCULAR | Status: DC | PRN
  Administered 2015-01-10: 150 ug via INTRAVENOUS
  Administered 2015-01-10 (×2): 50 ug via INTRAVENOUS

## 2015-01-10 MED ORDER — ONDANSETRON HCL 4 MG/2ML IJ SOLN: 4.0000 mg | Freq: Four times a day (QID) | INTRAMUSCULAR | Status: DC | PRN

## 2015-01-10 MED ORDER — METHOCARBAMOL 1000 MG/10ML IJ SOLN
500.0000 mg | Freq: Four times a day (QID) | INTRAVENOUS | Status: DC | PRN
  Filled 2015-01-10: qty 5

## 2015-01-10 SURGICAL SUPPLY — 45 items
BLADE SURG 10 STRL SS (BLADE) IMPLANT
BLADE SURG 21 STRL SS (BLADE) ×2 IMPLANT
BNDG COHESIVE 4X5 TAN STRL (GAUZE/BANDAGES/DRESSINGS) IMPLANT
BNDG COHESIVE 6X5 TAN STRL LF (GAUZE/BANDAGES/DRESSINGS) IMPLANT
BNDG GAUZE ELAST 4 BULKY (GAUZE/BANDAGES/DRESSINGS) ×4 IMPLANT
CANISTER WOUND CARE 500ML ATS (WOUND CARE) ×1 IMPLANT
COVER SURGICAL LIGHT HANDLE (MISCELLANEOUS) ×4 IMPLANT
DRAPE INCISE IOBAN 66X45 STRL (DRAPES) ×2 IMPLANT
DRAPE U-SHAPE 47X51 STRL (DRAPES) ×2 IMPLANT
DRSG ADAPTIC 3X8 NADH LF (GAUZE/BANDAGES/DRESSINGS) ×2 IMPLANT
DRSG MEPITEL 4X7.2 (GAUZE/BANDAGES/DRESSINGS) ×1 IMPLANT
DRSG VAC ATS MED SENSATRAC (GAUZE/BANDAGES/DRESSINGS) ×1 IMPLANT
DRSG VAC ATS SM SENSATRAC (GAUZE/BANDAGES/DRESSINGS) ×1 IMPLANT
DURAPREP 26ML APPLICATOR (WOUND CARE) ×2 IMPLANT
ELECT CAUTERY BLADE 6.4 (BLADE) IMPLANT
ELECT REM PT RETURN 9FT ADLT (ELECTROSURGICAL)
ELECTRODE REM PT RTRN 9FT ADLT (ELECTROSURGICAL) IMPLANT
GAUZE SPONGE 4X4 12PLY STRL (GAUZE/BANDAGES/DRESSINGS) ×2 IMPLANT
GLOVE BIOGEL PI IND STRL 9 (GLOVE) ×1 IMPLANT
GLOVE BIOGEL PI INDICATOR 9 (GLOVE) ×1
GLOVE SURG ORTHO 9.0 STRL STRW (GLOVE) ×2 IMPLANT
GOWN STRL REUS W/ TWL LRG LVL3 (GOWN DISPOSABLE) ×1 IMPLANT
GOWN STRL REUS W/ TWL XL LVL3 (GOWN DISPOSABLE) ×2 IMPLANT
GOWN STRL REUS W/TWL LRG LVL3 (GOWN DISPOSABLE) ×2
GOWN STRL REUS W/TWL XL LVL3 (GOWN DISPOSABLE) ×4
GRAFT TISS THERASKIN 2X3 (Tissue) IMPLANT
HANDPIECE INTERPULSE COAX TIP (DISPOSABLE)
KIT BASIN OR (CUSTOM PROCEDURE TRAY) ×2 IMPLANT
KIT ROOM TURNOVER OR (KITS) ×2 IMPLANT
KIT STIMULAN RAPID CURE 5CC (Orthopedic Implant) ×1 IMPLANT
MANIFOLD NEPTUNE II (INSTRUMENTS) ×2 IMPLANT
NS IRRIG 1000ML POUR BTL (IV SOLUTION) ×2 IMPLANT
PACK ORTHO EXTREMITY (CUSTOM PROCEDURE TRAY) ×2 IMPLANT
PAD ARMBOARD 7.5X6 YLW CONV (MISCELLANEOUS) ×4 IMPLANT
SET HNDPC FAN SPRY TIP SCT (DISPOSABLE) IMPLANT
STOCKINETTE IMPERVIOUS 9X36 MD (GAUZE/BANDAGES/DRESSINGS) IMPLANT
SWAB COLLECTION DEVICE MRSA (MISCELLANEOUS) ×2 IMPLANT
TISSUE THERASKIN 2X3 (Tissue) ×2 IMPLANT
TOWEL OR 17X24 6PK STRL BLUE (TOWEL DISPOSABLE) ×2 IMPLANT
TOWEL OR 17X26 10 PK STRL BLUE (TOWEL DISPOSABLE) ×2 IMPLANT
TUBE ANAEROBIC SPECIMEN COL (MISCELLANEOUS) IMPLANT
TUBE CONNECTING 12X1/4 (SUCTIONS) ×2 IMPLANT
UNDERPAD 30X30 INCONTINENT (UNDERPADS AND DIAPERS) ×2 IMPLANT
WATER STERILE IRR 1000ML POUR (IV SOLUTION) ×2 IMPLANT
YANKAUER SUCT BULB TIP NO VENT (SUCTIONS) ×2 IMPLANT

## 2015-01-10 NOTE — Anesthesia Procedure Notes (Signed)
Procedure Name: LMA Insertion Date/Time: 01/10/2015 7:57 AM Performed by: Marinda Elk A Pre-anesthesia Checklist: Patient identified, Emergency Drugs available, Suction available, Patient being monitored and Timeout performed Patient Re-evaluated:Patient Re-evaluated prior to inductionOxygen Delivery Method: Circle system utilized Preoxygenation: Pre-oxygenation with 100% oxygen Intubation Type: IV induction LMA: LMA inserted LMA Size: 5.0 Number of attempts: 1 Placement Confirmation: positive ETCO2 and breath sounds checked- equal and bilateral Tube secured with: Tape Dental Injury: Teeth and Oropharynx as per pre-operative assessment

## 2015-01-10 NOTE — Progress Notes (Signed)
Patient underwent placement of antibiotics beads wound VAC and TheraSkin today. Patient had significant undermining and a large area of necrotic tissue adjacent to the tibia just proximal to the medial malleolus. Ideally patient should get at least 4 weeks of IV antibiotics and most likely will need a PICC line. Most likely will require vancomycin.

## 2015-01-10 NOTE — Anesthesia Postprocedure Evaluation (Signed)
Anesthesia Post Note  Patient: Randy Chen  Procedure(s) Performed: Procedure(s) (LRB): DEBRIDEMENT ANKLE PLACEMENT ANTIBIOTIC BEADS AND WOUND VAC (Left)  Anesthesia type: general  Patient location: PACU  Post pain: Pain level controlled  Post assessment: Patient's Cardiovascular Status Stable  Last Vitals:  Filed Vitals:   01/10/15 0911  BP: 129/61  Pulse: 74  Temp: 36.8 C  Resp: 11    Post vital signs: Reviewed and stable  Level of consciousness: sedated  Complications: No apparent anesthesia complications

## 2015-01-10 NOTE — Progress Notes (Signed)
PROGRESS NOTE  Randy Chen OAC:[id number] DOB: [birth year] DOA: 01/08/2015 PCP: Gennette Pac, MD  HPI/Recap of past 24 hours:  Returned from OR, on wound vac, prn pain meds, denies chest pain, no sob. Family at bedside.  Assessment/Plan: Principal Problem:   Cellulitis of left lower leg Active Problems:   Hx of CABG   History of prostate cancer   CAD (coronary artery disease)   S/P aortic valve replacement with bioprosthetic valve  1. Cellulitis of left lower leg Presented with a nonhealing left foot ulcer which he sustained as a trauma during the trip to [place] this past two weeks Wound did not get better after a course of Keflex / daily dressing PTA. Received IV vancomycin since admission,  Orthopedic Dr Sharol Given performed  OR 7/24.   Possible needing picc line for 4-6 weeks of vanc, will follow ortho recs.   2. Coronary artery disease as well as CABG and bio aortic valve replacement. home medications aspirin and Plavix  Plavix held on the day of debridement, Resume plavix post op if OK with ortho  3. Essential hypertension. Blood pressure at present stable continue Toprol-XL and norvasc,  lisinopril held perioperatively. Resume likely in 1-2 days postop.   4. Dyslipidemia. Continuing Lipitor.  DVT prophylaxis  With subQ heparin  Code Status: full  Family Communication: patient and wife  Disposition Plan: TBD   Consultants:  Ortho Dr. Sharol Given  Procedures: 7/24  Excisional DEBRIDEMENT ANKLE, WOUND 8 X 3 CM PLACEMENT ANTIBIOTIC BEADS Placement WOUND VAC  Antibiotics:  vanc from admission   Objective: BP 124/67 mmHg  Pulse 77  Temp(Src) 97.9 F (36.6 C) (Oral)  Resp 15  Ht 5\' 7"  (1.702 m)  Wt 85.775 kg (189 lb 1.6 oz)  BMI 29.61 kg/m2  SpO2 97%  Intake/Output Summary (Last 24 hours) at 01/10/15 1052 Last data filed at 01/10/15 0900  Gross per 24 hour  Intake 1561.25 ml  Output    825 ml  Net 736.25 ml   Filed Weights   01/08/15  1542 01/08/15 2055  Weight: 86.183 kg (190 lb) 85.775 kg (189 lb 1.6 oz)    Exam:   General:  NAD  Cardiovascular: RRR  Respiratory: CTABL  Abdomen: Soft/ND/NT, positive BS  Musculoskeletal: left ankle postop changes, +wound vac, good peripheral pulses  Neuro: aaox3  Data Reviewed: Basic Metabolic Panel:  Recent Labs Lab 01/08/15 1645 01/09/15 0630 01/10/15 0540  NA 135 136 135  K 4.0 4.2 4.1  CL 102 102 102  CO2 25 26 27   GLUCOSE 120* 115* 117*  BUN 27* 17 18  CREATININE 1.36* 1.31* 1.36*  CALCIUM 8.5* 8.5* 8.4*   Liver Function Tests:  Recent Labs Lab 01/09/15 0630 01/10/15 0540  AST 22 24  ALT 18 20  ALKPHOS 84 87  BILITOT 0.5 0.5  PROT 6.2* 5.9*  ALBUMIN 3.2* 3.2*   No results for input(s): LIPASE, AMYLASE in the last 168 hours. No results for input(s): AMMONIA in the last 168 hours. CBC:  Recent Labs Lab 01/08/15 1645 01/09/15 0630 01/10/15 0540  WBC 6.3 5.8 6.3  NEUTROABS 4.0 4.1 4.2  HGB 9.7* 9.8* 9.3*  HCT 29.8* 28.1* 28.1*  MCV 94.6 92.7 92.4  PLT 268 263 266   Cardiac Enzymes:    Recent Labs Lab 01/08/15 2204  CKTOTAL 57   BNP (last 3 results) No results for input(s): BNP in the last 8760 hours.  ProBNP (last 3 results) No results for input(s): PROBNP in the last  8760 hours.  CBG: No results for input(s): GLUCAP in the last 168 hours.  Recent Results (from the past 240 hour(s))  Surgical pcr screen     Status: None   Collection Time: 2015-01-28  2:00 PM  Result Value Ref Range Status   MRSA, PCR NEGATIVE NEGATIVE Final   Staphylococcus aureus NEGATIVE NEGATIVE Final    Comment:        The Xpert SA Assay (FDA approved for NASAL specimens in patients over 79 years of age), is one component of a comprehensive surveillance program.  Test performance has been validated by [facility] for patients greater than or equal to 20 year old. It is not intended to diagnose infection nor to guide or monitor treatment.       Studies: Dg Ankle Complete Left  01/28/15   CLINICAL DATA:  Pain and swelling with open wound medially  EXAM: LEFT ANKLE COMPLETE - 3+ VIEW  COMPARISON:  None.  FINDINGS: Generalized soft tissue swelling is noted. An open wound is noted medially just above the medial malleolus. No underlying bony abnormality is seen. No other focal abnormality is noted.  IMPRESSION: Soft tissue wound without acute bony abnormality.   Electronically Signed   By: Inez Catalina M.D.   On: 2015-01-28 13:16    Scheduled Meds: . amLODipine  10 mg Oral Daily  . aspirin EC  81 mg Oral QPM  . atorvastatin  40 mg Oral Daily  . heparin  5,000 Units Subcutaneous 3 times per day  . HYDROmorphone      . metoprolol succinate  50 mg Oral Daily  . vancomycin  750 mg Intravenous Q12H    Continuous Infusions: . sodium chloride       Time spent: 63mins  Sausha Raymond MD, PhD  [facility] Pager [phone]. If 7PM-7AM, please contact night-coverage at [email or web address], password [facility] 01/10/2015, 10:52 AM  LOS: 2 days

## 2015-01-10 NOTE — Progress Notes (Signed)
Pt for wound debridement of left ankle with consent, NPO midnight, transfer to OR at 0700 report given to Elenor Legato., family at bedside, alert and oriented, change the dressing on his left foot.

## 2015-01-10 NOTE — Transfer of Care (Signed)
Immediate Anesthesia Transfer of Care Note  Patient: Randy Chen  Procedure(s) Performed: Procedure(s): DEBRIDEMENT ANKLE PLACEMENT ANTIBIOTIC BEADS AND WOUND VAC (Left)  Patient Location: PACU  Anesthesia Type:General  Level of Consciousness: awake  Airway & Oxygen Therapy: Patient Spontanous Breathing  Post-op Assessment: Report given to RN and Post -op Vital signs reviewed and stable  Post vital signs: Reviewed and stable  Last Vitals:  Filed Vitals:   01/10/15 0606  BP: 131/65  Pulse: 80  Temp: 37.4 C  Resp: 16    Complications: No apparent anesthesia complications

## 2015-01-10 NOTE — Interval H&P Note (Signed)
History and Physical Interval Note:  01/10/2015 7:48 AM  Randy Chen  has presented today for surgery, with the diagnosis of debridement ankle  The various methods of treatment have been discussed with the patient and family. After consideration of risks, benefits and other options for treatment, the patient has consented to  Procedure(s): DEBRIDEMENT ANKLE PLACEMENT ANTIBIOTIC BEADS AND WOUND VAC (Left) as a surgical intervention .  The patient's history has been reviewed, patient examined, no change in status, stable for surgery.  I have reviewed the patient's chart and labs.  Questions were answered to the patient's satisfaction.     DUDA,MARCUS V

## 2015-01-10 NOTE — Op Note (Signed)
01/08/2015 - 01/10/2015  8:36 AM  PATIENT:  Randy Chen    PRE-OPERATIVE DIAGNOSIS:  Traumatic wound medial malleolus left ankle  POST-OPERATIVE DIAGNOSIS:  Same  PROCEDURE:   Excisional DEBRIDEMENT ANKLE, WOUND 8 X 3 CM PLACEMENT ANTIBIOTIC BEADS Placement WOUND VAC  SURGEON:  DUDA,MARCUS V, MD  PHYSICIAN ASSISTANT:None ANESTHESIA:   General  PREOPERATIVE INDICATIONS:  Randy Chen is a  81 y.o. male with a diagnosis of debridement ankle who failed conservative measures and elected for surgical management.    The risks benefits and alternatives were discussed with the patient preoperatively including but not limited to the risks of infection, bleeding, nerve injury, cardiopulmonary complications, the need for revision surgery, among others, and the patient was willing to proceed.  OPERATIVE IMPLANTS: Antibody beads with 500 mg vancomycin and 160 mg gentamicin  OPERATIVE FINDINGS: Large amount of necrotic tissue with significant undermining of the wound.  OPERATIVE PROCEDURE: Patient was brought to the operating room and underwent a general and aesthetic. After adequate levels anesthesia obtained patient's left lower extremity was prepped using DuraPrep draped into a sterile field. A timeout was called. Elliptical incision was made around the traumatic wound this left a wound that was 3 x 8 cm. Using a curette knife and Ronjair skin soft tissue and fascia was excised. The wound was irrigated with normal saline. The remaining tissue was healthy and viable. There was significant undermining of the wound. Antibiotics beads were then placed with 5 mL of stimulant powder 500 mg vancomycin and 160 mg gentamicin. TheraSkin was then applied 2 x 3" graft and this was stapled in place. Mepitel and a wound VAC were applied this had good suction fit. Patient was extubated taken to the PACU in stable condition.
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
## 2015-01-10 NOTE — Evaluation (Signed)
Physical Therapy Evaluation Patient Details Name: Randy Chen MRN: [id number] DOB: August 02, 1927 Today's Date: 01/10/2015   History of Present Illness  Patient is a 81 y/o male s/p debridement of left ankle with antiobiotic bead placement and wound vac application due to nonhealing wound. PMH includes HTN, HLD, CAD.  Clinical Impression  Patient presents with pain and mild balance deficits secondary to above surgery impacting mobility. Pt very independent PTA. Reluctant to use RW during ambulation however discussed importance of using device to help with pain control, balance and offloading LLE. Pt agreeable to go to ST SNF at [facility], called [place] to improve strength, mobility, balance and overall function so pt can maximize independence and return to PLOF.     Follow Up Recommendations SNF;Supervision/Assistance - 24 hour    Equipment Recommendations  None recommended by PT ("I have access to RW at [facility].")    Recommendations for Other Services       Precautions / Restrictions Precautions Precautions: Fall Precaution Comments: wound vac Restrictions Weight Bearing Restrictions: Yes LLE Weight Bearing: Weight bearing as tolerated      Mobility  Bed Mobility Overal bed mobility: Modified Independent                Transfers Overall transfer level: Needs assistance Equipment used: Rolling walker (2 wheeled) Transfers: Sit to/from Stand Sit to Stand: Min guard         General transfer comment: Min guard for safety. Reluctant to place full weight through LLE.   Ambulation/Gait Ambulation/Gait assistance: Min guard Ambulation Distance (Feet): 10 Feet Assistive device: Rolling walker (2 wheeled) Gait Pattern/deviations: Step-through pattern;Decreased stride length;Decreased weight shift to left;Decreased stance time - left;Decreased step length - right;Trunk flexed   Gait velocity interpretation: <1.8 ft/sec, indicative of risk for recurrent  falls General Gait Details: Pt able to ambulate a few steps to chair. Reluctant to use RW for support.   Stairs            Wheelchair Mobility    Modified Rankin (Stroke Patients Only)       Balance Overall balance assessment: Needs assistance Sitting-balance support: Feet supported;No upper extremity supported Sitting balance-Leahy Scale: Good     Standing balance support: During functional activity Standing balance-Leahy Scale: Fair                               Pertinent Vitals/Pain Pain Assessment: 0-10 Pain Score: 7  Pain Location: left foot Pain Descriptors / Indicators: Sore;Aching Pain Intervention(s): Monitored during session;Repositioned    Home Living Family/patient expects to be discharged to:: Skilled nursing facility                 Additional Comments: Pt lives at [facility] and plans to transition to the skilled nursing portion called [place].    Prior Function Level of Independence: Independent         Comments: Pt very active. Just returned from trip to [place] with wife.      Hand Dominance        Extremity/Trunk Assessment   Upper Extremity Assessment: Defer to OT evaluation           Lower Extremity Assessment: LLE deficits/detail   LLE Deficits / Details: Limited ankle AROM secondary to pain from wound location. Hip, knee AROM/strength [facility].     Communication   Communication: No difficulties  Cognition Arousal/Alertness: Awake/alert Behavior During Therapy: WFL for tasks assessed/performed Overall Cognitive  Status: Within Functional Limits for tasks assessed                      General Comments      Exercises General Exercises - Lower Extremity Ankle Circles/Pumps: Both;10 reps;Supine Long Arc Quad: Left;10 reps;Seated Hip Flexion/Marching: Left;10 reps;Seated      Assessment/Plan    PT Assessment Patient needs continued PT services  PT Diagnosis Difficulty walking;Acute  pain   PT Problem List Pain;Decreased balance;Decreased mobility;Decreased range of motion  PT Treatment Interventions Balance training;Gait training;Functional mobility training;Therapeutic activities;Therapeutic exercise;Patient/family education   PT Goals (Current goals can be found in the Care Plan section) Acute Rehab PT Goals Patient Stated Goal: to go to rehab at [facility] PT Goal Formulation: With patient Time For Goal Achievement: 01/24/15 Potential to Achieve Goals: Good    Frequency Min 3X/week   Barriers to discharge        Co-evaluation               End of Session Equipment Utilized During Treatment: Gait belt Activity Tolerance: Patient tolerated treatment well Patient left: in chair;with call bell/phone within reach Nurse Communication: Mobility status         Time: 1420-1446 PT Time Calculation (min) (ACUTE ONLY): 26 min   Charges:   PT Evaluation $Initial PT Evaluation Tier I: 1 Procedure PT Treatments $Therapeutic Activity: 8-22 mins   PT G Codes:        Izek Corvino A Moya Duan 01/10/2015, 2:54 PM Wray Kearns, [place], DPT [phone]

## 2015-01-11 ENCOUNTER — Encounter (HOSPITAL_COMMUNITY): Payer: Self-pay | Admitting: Orthopedic Surgery

## 2015-01-11 DIAGNOSIS — Z8546 Personal history of malignant neoplasm of prostate: Secondary | ICD-10-CM

## 2015-01-11 LAB — VANCOMYCIN, TROUGH: VANCOMYCIN TR: 23 ug/mL — AB (ref 10.0–20.0)

## 2015-01-11 LAB — CBC
HCT: 27 % — ABNORMAL LOW (ref 39.0–52.0)
HEMOGLOBIN: 9 g/dL — AB (ref 13.0–17.0)
MCH: 30.7 pg (ref 26.0–34.0)
MCHC: 33.3 g/dL (ref 30.0–36.0)
MCV: 92.2 fL (ref 78.0–100.0)
PLATELETS: 268 10*3/uL (ref 150–400)
RBC: 2.93 MIL/uL — AB (ref 4.22–5.81)
RDW: 13.2 % (ref 11.5–15.5)
WBC: 6.5 10*3/uL (ref 4.0–10.5)

## 2015-01-11 LAB — BASIC METABOLIC PANEL
Anion gap: 5 (ref 5–15)
BUN: 19 mg/dL (ref 6–20)
CO2: 26 mmol/L (ref 22–32)
Calcium: 8.7 mg/dL — ABNORMAL LOW (ref 8.9–10.3)
Chloride: 102 mmol/L (ref 101–111)
Creatinine, Ser: 1.31 mg/dL — ABNORMAL HIGH (ref 0.61–1.24)
GFR calc Af Amer: 56 mL/min — ABNORMAL LOW (ref >60)
GFR, EST NON AFRICAN AMERICAN: 48 mL/min — AB (ref >60)
GLUCOSE: 114 mg/dL — AB (ref 65–99)
Potassium: 5 mmol/L (ref 3.5–5.1)
Sodium: 133 mmol/L — ABNORMAL LOW (ref 135–145)

## 2015-01-11 MED ORDER — HYDROCODONE-ACETAMINOPHEN 5-325 MG PO TABS: 1.0000 | ORAL_TABLET | ORAL | Status: DC | PRN

## 2015-01-11 MED ORDER — VANCOMYCIN HCL 10 G IV SOLR: 1500.0000 mg | INTRAVENOUS | Status: DC

## 2015-01-11 MED ORDER — CLOPIDOGREL BISULFATE 75 MG PO TABS
75.0000 mg | ORAL_TABLET | Freq: Every day | ORAL | Status: DC
  Administered 2015-01-11: 75 mg via ORAL
  Filled 2015-01-11: qty 1

## 2015-01-11 MED ORDER — SODIUM CHLORIDE 0.9 % IJ SOLN: 10.0000 mL | INTRAMUSCULAR | Status: DC | PRN

## 2015-01-11 NOTE — Care Management Note (Signed)
Case Management Note  Patient Details  Name: RHONE OZAKI MRN: [id number] Date of Birth: July 28, 1927  Subjective/Objective:                    Action/Plan:  SW following  Expected Discharge Date:                  Expected Discharge Plan:  [place]  In-House Referral:  Clinical Social Work  Discharge planning Services     Post Acute Care Choice:    Choice offered to:     DME Arranged:    DME Agency:     HH Arranged:    [place] Agency:     Status of Service:  In process, will continue to follow  Medicare Important Message Given:    Date Medicare IM Given:    Medicare IM give by:    Date Additional Medicare IM Given:    Additional Medicare Important Message give by:     If discussed at [place] of Stay Meetings, dates discussed:    Additional Comments:  Marilu Favre, RN 01/11/2015, 11:18 AM

## 2015-01-11 NOTE — Progress Notes (Signed)
Randy Chen to be D/C'd Skilled nursing facility per MD order.  Discussed with the patient and all questions fully answered.  VSS, Skin clean, dry and intact without evidence of skin break down, no evidence of skin tears noted. IV catheter discontinued intact. PICI line placed. Site without signs and symptoms of complications.   Wound Vac clamped for transfer  Report called to [facility]  D/c education completed with patient/family including follow up instructions, medication list, d/c activities limitations if indicated, with other d/c instructions as indicated by MD - patient able to verbalize understanding, all questions fully answered.   Patient instructed to return to ED, call 911, or call MD for any changes in condition.   Patient escorted via [place], and D/C home via private auto.  Theora Master 01/11/2015 6:45 PM

## 2015-01-11 NOTE — Progress Notes (Signed)
ANTIBIOTIC CONSULT NOTE - FOLLOW UP  Pharmacy Consult:  Vancomycin Indication:  Cellulitis + Osteomyelits  No Known Allergies  Patient Measurements: Height: 5\' 7"  (170.2 cm) Weight: 189 lb 1.6 oz (85.775 kg) IBW/kg (Calculated) : 66.1  Vital Signs: Temp: 98.4 F (36.9 C) (07/25 0650) Temp Source: Oral (07/25 0650) BP: 163/65 mmHg (07/25 0650) Pulse Rate: 85 (07/25 0650) Intake/Output from previous day: 07/24 0701 - 07/25 0700 In: 2424.5 [P.O.:1398; I.V.:726.5; IV Piggyback:300] Out: 1670 [Urine:1575; Blood:25] Intake/Output from this shift: Total I/O In: 360 [P.O.:360] Out: 300 [Urine:300]  Labs:  Recent Labs  01/09/15 0630 01/10/15 0540 01/11/15 0519  WBC 5.8 6.3 6.5  HGB 9.8* 9.3* 9.0*  PLT 263 266 268  CREATININE 1.31* 1.36* 1.31*   Estimated Creatinine Clearance: 43.9 mL/min (by C-G formula based on Cr of 1.31).  Recent Labs  01/11/15 0950  VANCOTROUGH 23*     Microbiology: Recent Results (from the past 720 hour(s))  Culture, blood (routine x 2)     Status: None (Preliminary result)   Collection Time: 01/08/15 10:04 PM  Result Value Ref Range Status   Specimen Description BLOOD RIGHT ANTECUBITAL  Final   Special Requests BOTTLES DRAWN AEROBIC AND ANAEROBIC 10CC  Final   Culture NO GROWTH 1 DAY  Final   Report Status PENDING  Incomplete  Culture, blood (routine x 2)     Status: None (Preliminary result)   Collection Time: 01/08/15 10:15 PM  Result Value Ref Range Status   Specimen Description BLOOD LEFT HAND  Final   Special Requests BOTTLES DRAWN AEROBIC AND ANAEROBIC 10CC  Final   Culture NO GROWTH 1 DAY  Final   Report Status PENDING  Incomplete  Surgical pcr screen     Status: None   Collection Time: 01/09/15  2:00 PM  Result Value Ref Range Status   MRSA, PCR NEGATIVE NEGATIVE Final   Staphylococcus aureus NEGATIVE NEGATIVE Final    Comment:        The Xpert SA Assay (FDA approved for NASAL specimens in patients over 78 years of age), is  one component of a comprehensive surveillance program.  Test performance has been validated by [facility] for patients greater than or equal to 59 year old. It is not intended to diagnose infection nor to guide or monitor treatment.       Assessment: Randy Chen to continue on vancomycin for cellulitis with osteomyelitis, s/p I&D on 01/10/15.  Patient with renal dysfunction; however, it has been stable.  Vancomycin trough is slightly supra-therapeutic.  Noted plan for long-term IV antibiotics.  Vanc 7/22 >>  7/25 VT = 23 mcg/mL on 750mg  q12 >> 1500mg  q24  7/22 BCx2 - NGTD   Goal of Therapy:  Vancomycin trough level 15-20 mcg/ml   Plan:  - Vanc 1500mg  IV Q24H, start tomorrow 7/26 at 0900 - Monitor renal fxn, clinical progress, weekly vanc trough - F/U resume home meds    Maty Zeisler D. Mina Marble, PharmD, BCPS Pager:  [phone] 01/11/2015, 11:42 AM

## 2015-01-11 NOTE — Discharge Summary (Signed)
Discharge Summary  Randy Chen:[id number] DOB: 19-Jun-1928  PCP: Gennette Pac, MD  Admit date: 01/08/2015 Discharge date: 01/11/2015  Time spent: >88mins  Recommendations for Outpatient Follow-up:  1. F/u with PMD within a week, pmd to repeat cbc/bmp at follow up. pmd to monitor vanc level. 2. F/u with orthopedics Dr. Sharol Given in two weeks 3. Discharge to SNF  For wound vac changes/iv vanc infusion and PT/OT.  Discharge Diagnoses:  Active Hospital Problems   Diagnosis Date Noted  . Cellulitis of left lower leg 01/08/2015  . CAD (coronary artery disease) 01/08/2015  . S/P aortic valve replacement with bioprosthetic valve 01/08/2015  . Hx of CABG 11/18/2010  . History of prostate cancer 11/18/2010    Resolved Hospital Problems   Diagnosis Date Noted Date Resolved  No resolved problems to display.    Discharge Condition: stable  Diet recommendation: heart healthy  Filed Weights   01/08/15 1542 01/08/15 2055  Weight: 86.183 kg (190 lb) 85.775 kg (189 lb 1.6 oz)    History of present illness:  Randy Chen is a 81 y.o. male with Past medical history of hypertension, dyslipidemia, coronary artery disease status post CABG, aortic stenosis status post bioprosthetic aortic valve replacement. The patient is presenting with complaints off nonhealing wound. The patient was seen at [facility] on 12/22/2014 for injury to the left foot. Patient was at [facility] [place] airport and wide on escalator suddenly lost his balance and fell few steps. After the fall he sustained injury to his left ankle as well as right side of the chest as well as left side of the shoulder. Patient mentions he had multiple x-rays at that point and also found to have arterial bleeding on the left foot. The patient's foot was dressed with the tissue glue and patient was discharged back on Keflex. Patient went to [place] and after completing history he came back home today and came to the urgent care  for further evaluation. Patient mentions he has been taking the Keflex and a regular basis and also mentions that he has been doing dressing on a regular basis initially twice a day and later on once a day. Patient also mentions he has been avoiding walking significant on his left foot. Although the left foot has become more red and he has been so painful while ambulating. He denies any fever or chills chest pain shortness of breath diarrhea constipation burning urination headache or any other focal deficit. He mentions he has been given tetanus shot with his last ER visit.  The patient is coming from home.  At his baseline ambulates without any support And is independent for most of his ADL manages her medication on his own.  Hospital Course:  Principal Problem:   Cellulitis of left lower leg Active Problems:   Hx of CABG   History of prostate cancer   CAD (coronary artery disease)   S/P aortic valve replacement with bioprosthetic valve  1. Cellulitis of left lower leg Presented with a nonhealing left foot ulcer which he sustained as a trauma during the trip to [place] this past two weeks Wound did not get better after a course of Keflex / daily dressing PTA. Received IV vancomycin since admission,  Orthopedic Dr Sharol Given performed I/D and placed wound vac on 7/24.   picc line placement for 4-6 weeks of vanc, close follow up with ortho and pmd.   2. Coronary artery disease as well as CABG and bio aortic valve replacement. home medications aspirin  and Plavix  Plavix held on the day of debridement, discussed with orthopedics Dr. Sharol Given, he oked with resuming plavix at discharge.    3. Essential hypertension. Blood pressure at present stable continue Toprol-XL and norvasc,  lisinopril held perioperatively. Resume at discharge.   4. Dyslipidemia. Continuing Lipitor.    Code Status: full  Family Communication: patient and wife  Disposition Plan:  SNF   Consultants:  Ortho Dr. Sharol Given  Procedures: 7/24  Excisional DEBRIDEMENT ANKLE, WOUND 8 X 3 CM PLACEMENT ANTIBIOTIC BEADS Placement WOUND VAC  Antibiotics:  vanc from admission   Discharge Exam: BP 163/65 mmHg  Pulse 85  Temp(Src) 98.4 F (36.9 C) (Oral)  Resp 14  Ht 5\' 7"  (1.702 m)  Wt 85.775 kg (189 lb 1.6 oz)  BMI 29.61 kg/m2  SpO2 100%   General: NAD  Cardiovascular: RRR  Respiratory: CTABL  Abdomen: Soft/ND/NT, positive BS  Musculoskeletal: left ankle postop changes, +wound vac, good peripheral pulses  Neuro: aaox3   Discharge Instructions You were cared for by a hospitalist during your hospital stay. If you have any questions about your discharge medications or the care you received while you were in the hospital after you are discharged, you can call the unit and asked to speak with the hospitalist on call if the hospitalist that took care of you is not available. Once you are discharged, your primary care physician will handle any further medical issues. Please note that NO REFILLS for any discharge medications will be authorized once you are discharged, as it is imperative that you return to your primary care physician (or establish a relationship with a primary care physician if you do not have one) for your aftercare needs so that they can reassess your need for medications and monitor your lab values.     Medication List    TAKE these medications        acetaminophen 500 MG tablet  Commonly known as:  TYLENOL  Take 1,000 mg by mouth 2 (two) times daily as needed for mild pain.     amLODipine 10 MG tablet  Commonly known as:  NORVASC  Take 1 tablet (10 mg total) by mouth daily.     aspirin EC 81 MG tablet  Take 81 mg by mouth every evening.     atorvastatin 80 MG tablet  Commonly known as:  LIPITOR  Take 0.5 tablets (40 mg total) by mouth daily.     CALCIUM 1000 + D PO  Take 1,000 mg by mouth daily.     clopidogrel 75 MG tablet   Commonly known as:  PLAVIX  Take 1 tablet (75 mg total) by mouth daily.     folic acid 211 MCG tablet  Commonly known as:  FOLVITE  Take 400 mcg by mouth daily.     GLUCOSAMINE 1500 COMPLEX PO  Take 1,500 mg by mouth daily.     HYDROcodone-acetaminophen 5-325 MG per tablet  Commonly known as:  NORCO/VICODIN  Take 1-2 tablets by mouth every 4 (four) hours as needed for moderate pain.     lisinopril 40 MG tablet  Commonly known as:  PRINIVIL,ZESTRIL  Take 40 mg by mouth 2 (two) times daily.     meclizine 25 MG tablet  Commonly known as:  ANTIVERT  Take 1 tablet (25 mg total) by mouth 3 (three) times daily as needed for dizziness.     metoprolol succinate 50 MG 24 hr tablet  Commonly known as:  TOPROL-XL  Take 1 tablet (  50 mg total) by mouth daily.     multivitamin with minerals Tabs tablet  Take 1 tablet by mouth daily. Theragran     nitroGLYCERIN 0.4 MG SL tablet  Commonly known as:  NITROSTAT  Place 1 tablet (0.4 mg total) under the tongue every 5 (five) minutes as needed for chest pain.     vancomycin 1,500 mg in sodium chloride 0.9 % 500 mL  Inject 1,500 mg into the vein daily.  Start taking on:  01/12/2015     vitamin C 500 MG tablet  Commonly known as:  ASCORBIC ACID  Take 500 mg by mouth daily.     Vitamin D 2000 UNITS Caps  Take 2,000 Units by mouth daily.       No Known Allergies Follow-up Information    Follow up with DUDA,MARCUS V, MD In 2 weeks.   Specialty:  Orthopedic Surgery   Contact information:   [place] [address] [phone]       Follow up with Gennette Pac, MD In 1 week.   Specialty:  Family Medicine   Why:  post hospital discharge follow up, pmd to repeat cbc/bmp at follow up   Contact information:   [place] [address] [phone]        The results of significant diagnostics from this hospitalization (including imaging, microbiology, ancillary and laboratory) are listed  below for reference.    Significant Diagnostic Studies: Dg Ankle Complete Left  January 24, 2015   CLINICAL DATA:  Pain and swelling with open wound medially  EXAM: LEFT ANKLE COMPLETE - 3+ VIEW  COMPARISON:  None.  FINDINGS: Generalized soft tissue swelling is noted. An open wound is noted medially just above the medial malleolus. No underlying bony abnormality is seen. No other focal abnormality is noted.  IMPRESSION: Soft tissue wound without acute bony abnormality.   Electronically Signed   By: Inez Catalina M.D.   On: 01/24/2015 13:16   Dg Foot Complete Left  2015/01/24   CLINICAL DATA:  81 year old male with foot injury. Open wound on the medial ankle.  EXAM: LEFT FOOT - COMPLETE 3+ VIEW  COMPARISON:  None.  FINDINGS: No acute fracture or dislocation. Osteopenia. No soft tissue gas or radiopaque foreign object identified.  IMPRESSION: No acute osseous pathology. MRI may provide better evaluation if there is high clinical suspicion for osteomyelitis.   Electronically Signed   By: Anner Crete M.D.   On: 2015-01-24 02:33    Microbiology: Recent Results (from the past 240 hour(s))  Culture, blood (routine x 2)     Status: None (Preliminary result)   Collection Time: 01/08/15 10:04 PM  Result Value Ref Range Status   Specimen Description BLOOD RIGHT ANTECUBITAL  Final   Special Requests BOTTLES DRAWN AEROBIC AND ANAEROBIC 10CC  Final   Culture NO GROWTH 2 DAYS  Final   Report Status PENDING  Incomplete  Culture, blood (routine x 2)     Status: None (Preliminary result)   Collection Time: 01/08/15 10:15 PM  Result Value Ref Range Status   Specimen Description BLOOD LEFT HAND  Final   Special Requests BOTTLES DRAWN AEROBIC AND ANAEROBIC 10CC  Final   Culture NO GROWTH 2 DAYS  Final   Report Status PENDING  Incomplete  Surgical pcr screen     Status: None   Collection Time: 01/24/2015  2:00 PM  Result Value Ref Range Status   MRSA, PCR NEGATIVE NEGATIVE Final   Staphylococcus aureus NEGATIVE  NEGATIVE Final    Comment:        The Xpert SA Assay (FDA approved for NASAL specimens in patients over 15 years of age), is one component of a comprehensive surveillance program.  Test performance has been validated by [facility] for patients greater than or equal to 36 year old. It is not intended to diagnose infection nor to guide or monitor treatment.      Labs: Basic Metabolic Panel:  Recent Labs Lab 01/08/15 1645 01/09/15 0630 01/10/15 0540 01/11/15 0519  NA 135 136 135 133*  K 4.0 4.2 4.1 5.0  CL 102 102 102 102  CO2 25 26 27 26   GLUCOSE 120* 115* 117* 114*  BUN 27* 17 18 19   CREATININE 1.36* 1.31* 1.36* 1.31*  CALCIUM 8.5* 8.5* 8.4* 8.7*   Liver Function Tests:  Recent Labs Lab 01/09/15 0630 01/10/15 0540  AST 22 24  ALT 18 20  ALKPHOS 84 87  BILITOT 0.5 0.5  PROT 6.2* 5.9*  ALBUMIN 3.2* 3.2*   No results for input(s): LIPASE, AMYLASE in the last 168 hours. No results for input(s): AMMONIA in the last 168 hours. CBC:  Recent Labs Lab 01/08/15 1645 01/09/15 0630 01/10/15 0540 01/11/15 0519  WBC 6.3 5.8 6.3 6.5  NEUTROABS 4.0 4.1 4.2  --   HGB 9.7* 9.8* 9.3* 9.0*  HCT 29.8* 28.1* 28.1* 27.0*  MCV 94.6 92.7 92.4 92.2  PLT 268 263 266 268   Cardiac Enzymes:  Recent Labs Lab 01/08/15 2204  CKTOTAL 57   BNP: BNP (last 3 results) No results for input(s): BNP in the last 8760 hours.  ProBNP (last 3 results) No results for input(s): PROBNP in the last 8760 hours.  CBG: No results for input(s): GLUCAP in the last 168 hours.     SignedFlorencia Reasons MD, PhD  [facility] 01/11/2015, 12:43 PM

## 2015-01-11 NOTE — Progress Notes (Signed)
Peripherally Inserted Central Catheter/Midline Placement  The IV Nurse has discussed with the patient and/or persons authorized to consent for the patient, the purpose of this procedure and the potential benefits and risks involved with this procedure.  The benefits include less needle sticks, lab draws from the catheter and patient may be discharged home with the catheter.  Risks include, but not limited to, infection, bleeding, blood clot (thrombus formation), and puncture of an artery; nerve damage and irregular heat beat.  Alternatives to this procedure were also discussed.  PICC/Midline Placement Documentation  PICC / Midline Single Lumen 57/32/20 PICC Right Basilic 43 cm 2 cm (Active)  Indication for Insertion or Continuance of Line Home intravenous therapies (PICC only) 01/11/2015  7:00 PM  Exposed Catheter (cm) 2 cm 01/11/2015  7:00 PM  Dressing Change Due 01/18/15 01/11/2015  7:00 PM       Jule Economy Horton 01/11/2015, 7:07 PM

## 2015-01-11 NOTE — Care Management Important Message (Signed)
Important Message  Patient Details  Name: Randy Chen MRN: [id number] Date of Birth: October 01, 1927   Medicare Important Message Given:  Yes-second notification given    Pricilla Handler 01/11/2015, 2:51 PM

## 2015-01-11 NOTE — Clinical Social Work Note (Signed)
CSW received consult for SNF placement.  Patient is a resident at [facility] independent [facility], and recommendations is to discharge to [facility].  CSW spoke with patient and wife about going to SNF, patient is anxious to return back to [place].  CSW contacted [facility] SNF and they said they can take patient today once he has received his Picc Line.  Patient's wife will transport, facility said they will ensure his wound vac is received to continue his treatment.  Formal assessment to follow.  Jones Broom. [place], MSW, [place] 01/11/2015 5:27 PM

## 2015-01-11 NOTE — Progress Notes (Signed)
Patient ID: Randy Chen, male   DOB: 10/02/1927, 81 y.o.   MRN: [id number] Wound VAC working well. Patient is a resident at [facility] and anticipate he could be discharged to they're skilled nursing unit for antibiotics administration as well as changing of the wound VAC.

## 2015-01-11 NOTE — Progress Notes (Signed)
[facility] PROGRESS NOTE  Randy Chen VVZ:[id number] DOB: 12-Dec-1927 DOA: 01/08/2015 PCP: Gennette Pac, MD  HPI: Randy Chen is a 81 y.o. male with past medical history of hypertension, dyslipidemia, coronary artery disease status post CABG, aortic stenosis status post bioprosthetic aortic valve replacement. The patient is presenting with complaints off non-healing wound. The patient was seen at [facility] on 12/22/2014 for injury to the left foot. Patient was at [facility] airport and while on escalator suddenly lost his balance and fell a few steps. After the fall he sustained injury to his left ankle as well as right side of the chest as well as left side of the shoulder. Patient mentions he had multiple x-rays at that point and also found to have arterial bleeding on the left foot. The patient's foot was dressed with the tissue glue and patient was discharged back on Keflex. Patient went to [place] and after completing history he came back home today and came to the urgent care for further evaluation. Patient mentions he has been taking Keflex on a regular basis and also mentions that he has been doing dressing on a regular basis initially twice a day and later on once a day. Patient also mentions he has been avoiding walking much on his left foot. The left foot has become more red and painful while ambulating. He denies any fever or chills, chest pain, shortness of breath, diarrhea, constipation, burning urination, headache, or any other focal deficit. He mentions he has been given a tetanus shot at his last ER visit.  Subjective: Left foot feels much better today. Wound VAC is working well. Patient is anxious to return to [facility] where he will receive skilled nursing. Denies chest pain or shortness of breath.  Assessment/Plan: Principal Problem:  Cellulitis of left lower leg Active Problems:  Hx of CABG  History of prostate cancer  CAD (coronary artery  disease)  S/P aortic valve replacement with bioprosthetic valve  1. Cellulitis of left lower leg - Presented with a non-healing left foot ulcer which he sustained as an injury during a trip to [place] this past two weeks. - Wound did not get better after a course of Keflex / daily dressing PTA. Received IV vancomycin since admission,  - Orthopedics: Dr Sharol Given performed I/D and placed wound vac on 7/24.  - picc line placement for 4-6 weeks of vancomycin, close follow-up with ortho and pmd.  2. Coronary artery disease, CABG, and bio aortic valve replacement.  - Home medications: aspirin and Plavix   - Plavix held on the day of debridement, discussed with orthopedics Dr. Sharol Given, he OK'ed with resuming plavix at discharge.   3. Essential hypertension. - Blood pressure at present stable continue Toprol-XL and norvasc,  lisinopril held perioperatively.  - Resume at discharge.  4. Dyslipidemia. - ContinuingLipitor.  DVT prophylaxis: subQ heparin Code Status: Full code Family Communication: No family at bedside Disposition Plan: 7/25 to SNF   Consultants:  Ortho Dr. Sharol Given  Procedures: 7/24  Excisional DEBRIDEMENT ANKLE, WOUND 8 X 3 CM PLACEMENT ANTIBIOTIC BEADS Placement WOUND VAC  Antibiotics:  Vancomycin from admission   Objective: Filed Vitals:   01/11/15 1347  BP: 113/62  Pulse: 75  Temp: 98.4 F (36.9 C)  Resp: 16    Intake/Output Summary (Last 24 hours) at 01/11/15 1545 Last data filed at 01/11/15 1515  Gross per 24 hour  Intake 2120.5 ml  Output   2295 ml  Net -174.5 ml   [employer]  01/08/15 1542 01/08/15 2055  Weight: 86.183 kg (190 lb) 85.775 kg (189 lb 1.6 oz)    Exam:   General:  Patient is pleasant, lying supine on bed, in no acute distress.  Cardiovascular: regular rate and rhythm  Respiratory: clear to ausculation bilaterally, no wheezing  Abdomen: soft, non-tender, non-distended, +BS  Musculoskeletal: Left ankle post-op  changes, +wound vac, good peripheral pulses  Neuro: alert and oriented x 3  Data Reviewed: Basic Metabolic Panel:  Recent Labs Lab 01/08/15 1645 01/09/15 0630 01/10/15 0540 01/11/15 0519  NA 135 136 135 133*  K 4.0 4.2 4.1 5.0  CL 102 102 102 102  CO2 25 26 27 26   GLUCOSE 120* 115* 117* 114*  BUN 27* 17 18 19   CREATININE 1.36* 1.31* 1.36* 1.31*  CALCIUM 8.5* 8.5* 8.4* 8.7*   Liver Function Tests:  Recent Labs Lab 01/09/15 0630 01/10/15 0540  AST 22 24  ALT 18 20  ALKPHOS 84 87  BILITOT 0.5 0.5  PROT 6.2* 5.9*  ALBUMIN 3.2* 3.2*   No results for input(s): LIPASE, AMYLASE in the last 168 hours. No results for input(s): AMMONIA in the last 168 hours. CBC:  Recent Labs Lab 01/08/15 1645 01/09/15 0630 01/10/15 0540 01/11/15 0519  WBC 6.3 5.8 6.3 6.5  NEUTROABS 4.0 4.1 4.2  --   HGB 9.7* 9.8* 9.3* 9.0*  HCT 29.8* 28.1* 28.1* 27.0*  MCV 94.6 92.7 92.4 92.2  PLT 268 263 266 268   Cardiac Enzymes:  Recent Labs Lab 01/08/15 2204  CKTOTAL 57   BNP (last 3 results) No results for input(s): BNP in the last 8760 hours.  ProBNP (last 3 results) No results for input(s): PROBNP in the last 8760 hours.  CBG: No results for input(s): GLUCAP in the last 168 hours.  Recent Results (from the past 240 hour(s))  Culture, blood (routine x 2)     Status: None (Preliminary result)   Collection Time: 01/08/15 10:04 PM  Result Value Ref Range Status   Specimen Description BLOOD RIGHT ANTECUBITAL  Final   Special Requests BOTTLES DRAWN AEROBIC AND ANAEROBIC 10CC  Final   Culture NO GROWTH 2 DAYS  Final   Report Status PENDING  Incomplete  Culture, blood (routine x 2)     Status: None (Preliminary result)   Collection Time: 01/08/15 10:15 PM  Result Value Ref Range Status   Specimen Description BLOOD LEFT HAND  Final   Special Requests BOTTLES DRAWN AEROBIC AND ANAEROBIC 10CC  Final   Culture NO GROWTH 2 DAYS  Final   Report Status PENDING  Incomplete  Surgical pcr  screen     Status: None   Collection Time: 01/09/15  2:00 PM  Result Value Ref Range Status   MRSA, PCR NEGATIVE NEGATIVE Final   Staphylococcus aureus NEGATIVE NEGATIVE Final    Comment:        The Xpert SA Assay (FDA approved for NASAL specimens in patients over 39 years of age), is one component of a comprehensive surveillance program.  Test performance has been validated by [facility] for patients greater than or equal to 11 year old. It is not intended to diagnose infection nor to guide or monitor treatment.      Studies: No results found.  Scheduled Meds: . amLODipine  10 mg Oral Daily  . aspirin EC  81 mg Oral QPM  . atorvastatin  40 mg Oral Daily  . clopidogrel  75 mg Oral Daily  . heparin  5,000 Units Subcutaneous  3 times per day  . metoprolol succinate  50 mg Oral Daily  . [START ON 01/12/2015] vancomycin  1,500 mg Intravenous Q24H   Continuous Infusions: . sodium chloride 1 mL (01/10/15 2027)    Principal Problem:   Cellulitis of left lower leg Active Problems:   Hx of CABG   History of prostate cancer   CAD (coronary artery disease)   S/P aortic valve replacement with bioprosthetic valve    Time spent: 25 minutes    Lenda Kelp, PA-S  [facility]  01/11/2015, 3:45 PM  LOS: 3 days     I have directly reviewed the clinical findings, lab results and imaging studies. I have interviewed and examined the patient and agree with the documentation and management as recorded by the Physician extender.  This note is for education purpose, not for billing.  Lowella Kindley M.D on 01/11/2015 at 7:53 PM  [facility] Office  [phone]

## 2015-01-11 NOTE — Clinical Social Work Note (Signed)
Clinical Social Work Assessment  Patient Details  Name: Randy Chen MRN: [id number] Date of Birth: 1927-08-05  Date of referral:  01/11/15               Reason for consult:  Facility Placement                Permission sought to share information with:  Family Supports Permission granted to share information::  Yes, Verbal Permission Granted  Name::     Hildred Laser, patient's wife  Agency::  [facility]  Relationship::     Contact Information:     Housing/Transportation Living arrangements for the past 2 months:  [place] of Information:  Patient, Spouse Patient Interpreter Needed:  None Criminal Activity/Legal Involvement Pertinent to Current Situation/Hospitalization:  No - Comment as needed Significant Relationships:  Spouse Lives with:  Spouse Do you feel safe going back to the place where you live?  No (Patient feels safe to return to his apartment once he has received therapy.) Need for family participation in patient care:  No (Coment)  Care giving concerns:  Patient and wife feel like he needs some short term rehab and his IV antibiotics in order to return back home.   Social Worker assessment / plan:  Patient is a pleasant 81 year old male who lives with his wife at [employer].  Patient is alert and oriented x4 and very talkative.  Patient and wife is agreeable of him going to SNF for wound vac, iv antibiotics, and rehab.  Patient states they have been travelling recently and have not been home yet.  Patient states he is looking forward to receiving some therapy then returning back home with his wife to the independent living.  Patient states he has not been to rehab before, CSW explained the process and answered questions they had about insurance approval and discharge planning.  Patient hopes he will not have to be at SNF for a long period of time, but is realistic that it may be a couple of weeks.   Patient plans to discharge to SNF once he is medically ready and discharge orders have been received.  Employment status:  Retired Nurse, adult PT Recommendations:  [place] / Referral to community resources:     Patient/Family's Response to care:  Patient and family are agreeable to discharging to SNF for short term rehab and iv antibiotic treatment.  Patient/Family's Understanding of and Emotional Response to Diagnosis, Current Treatment, and Prognosis:  Patient and family are aware of patient's prognosis and current treatment plan.  Emotional Assessment Appearance:  Appears stated age Attitude/Demeanor/Rapport:    Affect (typically observed):  Hopeful, Stable, Calm, Appropriate Orientation:  Oriented to Self, Oriented to Place, Oriented to  Time, Oriented to Situation Alcohol / Substance use:  Not Applicable Psych involvement (Current and /or in the community):  No (Comment)  Discharge Needs  Concerns to be addressed:  No discharge needs identified Readmission within the last 30 days:  No Current discharge risk:  None Barriers to Discharge:  No Barriers Identified   Anell Barr 01/11/2015, 5:33 PM

## 2015-01-12 NOTE — Clinical Social Work Placement (Signed)
   CLINICAL SOCIAL WORK PLACEMENT  NOTE  Date:  01/12/2015  Patient Details  Name: Randy Chen MRN: [id number] Date of Birth: [birth year]  Clinical Social Work is seeking post-discharge placement for this patient at the [place] level of care (*CSW will initial, date and re-position this form in  chart as items are completed):  Yes   Patient/family provided with [facility] Work Department's list of facilities offering this level of care within the geographic area requested by the patient (or if unable, by the patient's family).  Yes   Patient/family informed of their freedom to choose among providers that offer the needed level of care, that participate in Medicare, Medicaid or managed care program needed by the patient, have an available bed and are willing to accept the patient.  Yes   Patient/family informed of [facility]'s ownership interest in [facility] and [facility], as well as of the fact that they are under no obligation to receive care at these facilities.  PASRR submitted to EDS on 01/11/15     PASRR number received on 01/11/15     Existing PASRR number confirmed on       FL2 transmitted to all facilities in geographic area requested by pt/family on 01/11/15     FL2 transmitted to all facilities within larger geographic area on 01/11/15     Patient informed that his/her managed care company has contracts with or will negotiate with certain facilities, including the following:        Yes   Patient/family informed of bed offers received.  Patient chooses bed at [facility] recommends and patient chooses bed at      Patient to be transferred to [facility] on 01/11/15.  Patient to be transferred to facility by Family vehicle     Patient family notified on 01/11/15 of transfer.  Name of family member notified:  Patient's wife [facility]       Additional Comment:     _______________________________________________ Ross Ludwig, [place] 01/12/2015, 8:29 AM

## 2015-01-14 LAB — CULTURE, BLOOD (ROUTINE X 2)
CULTURE: NO GROWTH
CULTURE: NO GROWTH

## 2015-04-22 ENCOUNTER — Other Ambulatory Visit: Payer: Self-pay | Admitting: Physician Assistant

## 2015-04-30 ENCOUNTER — Encounter: Payer: Self-pay | Admitting: Cardiology

## 2015-05-19 ENCOUNTER — Ambulatory Visit: Payer: Medicare Other | Admitting: Cardiology

## 2015-06-03 ENCOUNTER — Other Ambulatory Visit: Payer: Self-pay | Admitting: Physician Assistant

## 2015-06-03 ENCOUNTER — Other Ambulatory Visit: Payer: Self-pay | Admitting: Cardiology

## 2015-06-03 DIAGNOSIS — I259 Chronic ischemic heart disease, unspecified: Secondary | ICD-10-CM

## 2015-06-03 NOTE — Telephone Encounter (Signed)
Per last office visit Hypercholesterolemia followed by Dr. [name] and I do not see a recent lipid panel in epic. Please advise on refill. Thanks, MI

## 2015-06-04 ENCOUNTER — Ambulatory Visit (INDEPENDENT_AMBULATORY_CARE_PROVIDER_SITE_OTHER): Payer: Medicare Other | Admitting: Cardiology

## 2015-06-04 ENCOUNTER — Encounter: Payer: Self-pay | Admitting: Cardiology

## 2015-06-04 VITALS — BP 136/64 | HR 60 | Ht 67.5 in | Wt 191.8 lb

## 2015-06-04 DIAGNOSIS — Z952 Presence of prosthetic heart valve: Secondary | ICD-10-CM

## 2015-06-04 DIAGNOSIS — Z954 Presence of other heart-valve replacement: Secondary | ICD-10-CM

## 2015-06-04 DIAGNOSIS — Z951 Presence of aortocoronary bypass graft: Secondary | ICD-10-CM | POA: Diagnosis not present

## 2015-06-04 NOTE — Telephone Encounter (Signed)
Rx request sent to pharmacy.  

## 2015-06-04 NOTE — Patient Instructions (Signed)
Medication Instructions:  Your physician recommends that you continue on your current medications as directed. Please refer to the Current Medication list given to you today.  Labwork: none  Testing/Procedures: none  Follow-Up: Your physician wants you to follow-up in: 6 month ov with Dr Pat Kocher will receive a reminder letter in the mail two months in advance. If you don't receive a letter, please call our office to schedule the follow-up appointment.  If you need a refill on your cardiac medications before your next appointment, please call your pharmacy.

## 2015-06-04 NOTE — Progress Notes (Signed)
Cardiology Office Note   Date:  06/04/2015   ID:  Randy Chen, DOB 1927-08-13, MRN [id number]  PCP:  Gennette Pac, MD  Cardiologist: Darlin Coco MD  No chief complaint on file.     History of Present Illness: Randy Chen is a 81 y.o. male who presents for a month follow-up visit This pleasant 81 year old gentleman is seen for a followup office visit. He has a history of known heart disease. He had coronary artery bypass graft surgery in 1987. He subsequently developed aortic stenosis and in 2006 underwent aortic valve replacement with a bioprosthetic valve. There is a past history of essential hypertension and hypercholesterolemia. The patient is retired and lives at [employer]. He enjoys going to the exercise classes 3 times a week. The patient had a two-dimensional echocardiogram on 12/03/12 which showed an ejection fraction of 55-60% and showed good function of his aortic valve bioprosthetic replacement.  In December 2015 the patient had some chest discomfort during the night. We followed up with a Myoview stress test on 05/30/15 which showed no reversible ischemia and normal ejection fraction at 70%. The patient has had no subsequent chest discomfort. The patient continues to exercise on a regular basis by doing water aerobics. He mentioned that he is no longer on Lupron shots from his urologist Dr. Junious Silk. They are merely watching his prostate cancer situation from year to year. Last summer he was in the [place] his [place] to [place].  Going up the escalator he stumbled and fell and cut up his left ankle quite badly.  He was able to catch a flight to [place] the following day and when he got back to the [place] he saw Dr. Sharol Given who operated on him.  He is now back to normal activity. He has not been having any chest pain or shortness of breath or palpitations.  Past Medical History  Diagnosis Date  . Hypertension   . Hyperlipidemia   .  Coronary artery disease     CABG x5 - 1987  . History of atherosclerotic cardiovascular disease   . Aortic stenosis   . History of prostatectomy     Past Surgical History  Procedure Laterality Date  . Cardiac valve replacement  2006  . Cardiac catheterization      EF of 66%  . Coronary artery bypass graft  1987    x5  . I&d extremity Left 01/10/2015    Procedure: DEBRIDEMENT ANKLE PLACEMENT ANTIBIOTIC BEADS AND WOUND VAC;  Surgeon: [name], MD;  Location: [facility];  Service: Orthopedics;  Laterality: Left;     Current Outpatient Prescriptions  Medication Sig Dispense Refill  . acetaminophen (TYLENOL) 500 MG tablet Take 1,000 mg by mouth 2 (two) times daily as needed for mild pain.    [name] amLODipine (NORVASC) 10 MG tablet Take 5 mg by mouth daily.     [name] aspirin EC 81 MG tablet Take 81 mg by mouth every evening.    [name] atorvastatin (LIPITOR) 80 MG tablet Take 40 mg by mouth daily.    . Calcium Carb-Cholecalciferol (CALCIUM 1000 + D PO) Take 1,000 mg by mouth daily.    . Cholecalciferol (VITAMIN D) 2000 UNITS CAPS Take 2,000 Units by mouth daily.    . clopidogrel (PLAVIX) 75 MG tablet Take 1 tablet (75 mg total) by mouth daily. 90 tablet 1  . folic acid (FOLVITE) A999333 MCG tablet Take 400 mcg by mouth daily.     . Glucosamine-Chondroit-Vit C-Mn (  GLUCOSAMINE 1500 COMPLEX PO) Take 1,500 mg by mouth daily.    [name] HYDROcodone-acetaminophen (NORCO/VICODIN) 5-325 MG per tablet Take 1-2 tablets by mouth every 4 (four) hours as needed for moderate pain. 30 tablet 0  . lisinopril (PRINIVIL,ZESTRIL) 40 MG tablet Take 40 mg by mouth 2 (two) times daily.     . meclizine (ANTIVERT) 25 MG tablet Take 1 tablet (25 mg total) by mouth 3 (three) times daily as needed for dizziness. 90 tablet 3  . metoprolol succinate (TOPROL-XL) 50 MG 24 hr tablet Take 50 mg by mouth daily. Take with or immediately following a meal.    . Multiple Vitamin (MULTIVITAMIN WITH MINERALS) TABS Take 1 tablet by mouth daily.  Theragran    . MYRBETRIQ 50 MG TB24 tablet Take 50 mg by mouth daily.  5  . nitroGLYCERIN (NITROSTAT) 0.4 MG SL tablet Place 1 tablet (0.4 mg total) under the tongue every 5 (five) minutes as needed for chest pain. 100 tablet 3  . vitamin C (ASCORBIC ACID) 500 MG tablet Take 500 mg by mouth daily.     No current facility-administered medications for this visit.    Allergies:   Review of patient's allergies indicates no known allergies.    Social History:  The patient  reports that he quit smoking about 59 years ago. He does not have any smokeless tobacco history on file. He reports that he does not drink alcohol or use illicit drugs.   Family History:  The patient's family history includes Congestive Heart Failure in his sister and sister; Heart attack in his brother; Heart disease in his father; Heart failure in his mother.    ROS:  Please see the history of present illness.   Otherwise, review of systems are positive for none.   All other systems are reviewed and negative.    PHYSICAL EXAM: VS:  BP 136/64 mmHg  Pulse 60  Ht 5' 7.5" (1.715 m)  Wt 191 lb 12.8 oz (87 kg)  BMI 29.58 kg/m2 , BMI Body mass index is 29.58 kg/(m^2). GEN: Well nourished, well developed, in no acute distress HEENT: normal Neck: no JVD, carotid bruits, or masses Cardiac:  RRR; no murmurs, rubs, or gallops,no edema  Respiratory:  clear to auscultation bilaterally, normal work of breathing GI: soft, nontender, nondistended, + BS MS: no deformity or atrophy Skin: warm and dry, no rash Neuro:  Strength and sensation are intact Psych: euthymic mood, full affect   EKG:  EKG is ordered today. The ekg ordered today demonstrates sinus bradycardia at 53 bpm, otherwise within normal limits   Recent Labs: 01/10/2015: ALT 20 01/11/2015: BUN 19; Creatinine, Ser 1.31*; Hemoglobin 9.0*; Platelets 268; Potassium 5.0; Sodium 133*    Lipid Panel    Component Value Date/Time   CHOL 146 01/31/2012 1007   TRIG  125.0 01/31/2012 1007   HDL 54.50 01/31/2012 1007   CHOLHDL 3 01/31/2012 1007   VLDL 25.0 01/31/2012 1007   LDLCALC 67 01/31/2012 1007      Wt Readings from Last 3 Encounters:  06/04/15 191 lb 12.8 oz (87 kg)  01/08/15 189 lb 1.6 oz (85.775 kg)  11/09/14 193 lb (87.544 kg)         ASSESSMENT AND PLAN:  1. Ischemic heart disease status post CABG in 1987. Myoview stress test in December 2015 showed no reversible ischemia. 2. Status post bioprosthetic aortic valve replacement in 2006. Echocardiogram in 2014 showed good prosthetic valve function. 3. Essential hypertension 4. Hypercholesterolemia followed by Dr. Lennette Bihari  [place] 5. History of prostate cancer followed by Dr. Junious Silk   Current medicines are reviewed at length with the patient today.  The patient does not have concerns regarding medicines.  The following changes have been made:  no change  Labs/ tests ordered today include:  No orders of the defined types were placed in this encounter.     Disposition: Continue current medication.  Recheck in 6 months for office visit with Dr. Oval Linsey at [facility], Darlin Coco MD 06/04/2015 5:27 PM    [address], [place], [place]  [address] Phone: [phone]; Fax: [phone]

## 2015-08-19 ENCOUNTER — Other Ambulatory Visit: Payer: Self-pay | Admitting: *Deleted

## 2015-08-19 MED ORDER — ATORVASTATIN CALCIUM 80 MG PO TABS: 40.0000 mg | ORAL_TABLET | Freq: Every day | ORAL | Status: DC

## 2015-08-19 MED ORDER — AMLODIPINE BESYLATE 10 MG PO TABS: 5.0000 mg | ORAL_TABLET | Freq: Every day | ORAL | Status: DC

## 2015-08-19 MED ORDER — METOPROLOL SUCCINATE ER 50 MG PO TB24: 50.0000 mg | ORAL_TABLET | Freq: Every day | ORAL | Status: DC

## 2015-09-30 DIAGNOSIS — I10 Essential (primary) hypertension: Secondary | ICD-10-CM | POA: Diagnosis not present

## 2015-09-30 DIAGNOSIS — R899 Unspecified abnormal finding in specimens from other organs, systems and tissues: Secondary | ICD-10-CM | POA: Diagnosis not present

## 2015-09-30 DIAGNOSIS — I2510 Atherosclerotic heart disease of native coronary artery without angina pectoris: Secondary | ICD-10-CM | POA: Diagnosis not present

## 2015-09-30 DIAGNOSIS — Z0000 Encounter for general adult medical examination without abnormal findings: Secondary | ICD-10-CM | POA: Diagnosis not present

## 2015-09-30 DIAGNOSIS — Z8546 Personal history of malignant neoplasm of prostate: Secondary | ICD-10-CM | POA: Diagnosis not present

## 2015-09-30 DIAGNOSIS — R7301 Impaired fasting glucose: Secondary | ICD-10-CM | POA: Diagnosis not present

## 2015-09-30 DIAGNOSIS — Z952 Presence of prosthetic heart valve: Secondary | ICD-10-CM | POA: Diagnosis not present

## 2015-09-30 DIAGNOSIS — E785 Hyperlipidemia, unspecified: Secondary | ICD-10-CM | POA: Diagnosis not present

## 2015-09-30 DIAGNOSIS — N189 Chronic kidney disease, unspecified: Secondary | ICD-10-CM | POA: Diagnosis not present

## 2015-11-04 DIAGNOSIS — R351 Nocturia: Secondary | ICD-10-CM | POA: Diagnosis not present

## 2015-11-04 DIAGNOSIS — Z0000 Encounter for general adult medical examination without abnormal findings: Secondary | ICD-10-CM | POA: Diagnosis not present

## 2015-11-04 DIAGNOSIS — C61 Malignant neoplasm of prostate: Secondary | ICD-10-CM | POA: Diagnosis not present

## 2015-11-26 ENCOUNTER — Other Ambulatory Visit: Payer: Self-pay | Admitting: *Deleted

## 2015-11-29 ENCOUNTER — Other Ambulatory Visit: Payer: Self-pay | Admitting: Cardiovascular Disease

## 2015-11-29 MED ORDER — CLOPIDOGREL BISULFATE 75 MG PO TABS: 75.0000 mg | ORAL_TABLET | Freq: Every day | ORAL | Status: DC

## 2015-11-29 NOTE — Telephone Encounter (Signed)
Rx(s) sent to pharmacy electronically.  

## 2015-12-01 ENCOUNTER — Other Ambulatory Visit: Payer: Self-pay | Admitting: *Deleted

## 2015-12-02 MED ORDER — ATORVASTATIN CALCIUM 80 MG PO TABS: 40.0000 mg | ORAL_TABLET | Freq: Every day | ORAL | Status: DC

## 2015-12-02 NOTE — Telephone Encounter (Signed)
Rx(s) sent to pharmacy electronically.  

## 2015-12-06 NOTE — Progress Notes (Signed)
Cardiology Office Note   Date:  12/07/2015   ID:  Randy Chen, DOB [birth year], MRN [id number]  PCP:  Gennette Pac, MD  Cardiologist:   Skeet Latch, MD   Chief Complaint  Patient presents with  . 6 months    Patient has no complaints.      History of Present Illness: Randy Chen is a 82 y.o. male with hypertension, hyperlipidemia, CAD s/p CABG (1987), aortic stenosis s/p bioprosthetic AVR (2006) and prostate cancer who presents for follow up.  Randy Chen was previously a patient of Dr. Mare Ferrari.  He had an echo 11/2012 that revealed LVEF 55-60% with a well-functioning AVR.  He had an episode of chest pain 05/2014 and underwent nuclear stress testing 05/2015 that was negative for ischemia.  Recently Randy Chen has been doing well.  His only complaint is diarrhea and cramping that occurs intermittantly.  He wonders if it may be related to his medication.  It typically happens in the evening after dinner, and he also wonders if it may be food related.    Randy Chen denies chest pain or shortness of breath.  He denies lower extremity edema, orthopnea or PND.  He swims three times per week and does water aerobics.  He checks his BP at home occasionally and it has been normal or near normal.   Past Medical History  Diagnosis Date  . Hypertension   . Hyperlipidemia   . Coronary artery disease     CABG x5 - 1987  . History of atherosclerotic cardiovascular disease   . Aortic stenosis   . History of prostatectomy     Past Surgical History  Procedure Laterality Date  . Cardiac valve replacement  2006  . Cardiac catheterization      EF of 66%  . Coronary artery bypass graft  1987    x5  . I&d extremity Left 01/10/2015    Procedure: DEBRIDEMENT ANKLE PLACEMENT ANTIBIOTIC BEADS AND WOUND VAC;  Surgeon: [name], MD;  Location: [facility];  Service: Orthopedics;  Laterality: Left;     Current Outpatient Prescriptions  Medication Sig Dispense Refill  .  acetaminophen (TYLENOL) 500 MG tablet Take 1,000 mg by mouth 2 (two) times daily as needed for mild pain.    [name] amLODipine (NORVASC) 10 MG tablet Take 0.5 tablets (5 mg total) by mouth daily. 90 tablet 1  . aspirin EC 81 MG tablet Take 81 mg by mouth every evening.    [name] atorvastatin (LIPITOR) 80 MG tablet Take 0.5 tablets (40 mg total) by mouth daily. 45 tablet 2  . Calcium Carb-Cholecalciferol (CALCIUM 1000 + D PO) Take 1,000 mg by mouth daily.    . Cholecalciferol (VITAMIN D) 2000 UNITS CAPS Take 2,000 Units by mouth daily.    . clopidogrel (PLAVIX) 75 MG tablet Take 1 tablet (75 mg total) by mouth daily. 90 tablet 0  . folic acid (FOLVITE) A999333 MCG tablet Take 400 mcg by mouth daily.     . Glucosamine-Chondroit-Vit C-Mn (GLUCOSAMINE 1500 COMPLEX PO) Take 1,500 mg by mouth daily.    [name] lisinopril (PRINIVIL,ZESTRIL) 40 MG tablet Take 40 mg by mouth 2 (two) times daily.     . metoprolol succinate (TOPROL-XL) 50 MG 24 hr tablet Take 1 tablet (50 mg total) by mouth daily. Take with or immediately following a meal. 90 tablet 1  . Multiple Vitamin (MULTIVITAMIN WITH MINERALS) TABS Take 1 tablet by mouth daily. Theragran    . nitroGLYCERIN (NITROSTAT) 0.4  MG SL tablet Place 1 tablet (0.4 mg total) under the tongue every 5 (five) minutes as needed for chest pain. 100 tablet 3  . vitamin C (ASCORBIC ACID) 500 MG tablet Take 500 mg by mouth daily.     No current facility-administered medications for this visit.    Allergies:   Review of patient's allergies indicates no known allergies.    Social History:  The patient  reports that he quit smoking about 60 years ago. He does not have any smokeless tobacco history on file. He reports that he does not drink alcohol or use illicit drugs.   Family History:  The patient's family history includes Congestive Heart Failure in his sister and sister; Heart attack in his brother; Heart disease in his father; Heart failure in his mother.    ROS:  Please see the  history of present illness.   Otherwise, review of systems are positive for none.   All other systems are reviewed and negative.    PHYSICAL EXAM: VS:  BP 146/66 mmHg  Pulse 56  Ht 5\' 7"  (1.702 m)  Wt 194 lb (87.998 kg)  BMI 30.38 kg/m2 , BMI Body mass index is 30.38 kg/(m^2). GENERAL:  Well appearing HEENT:  Pupils equal round and reactive, fundi not visualized, oral mucosa unremarkable NECK:  No jugular venous distention, waveform within normal limits, carotid upstroke brisk and symmetric, no bruits, no thyromegaly LYMPHATICS:  No cervical adenopathy LUNGS:  Clear to auscultation bilaterally HEART:  RRR.  PMI not displaced or sustained,S1 and S2 within normal limits, no S3, no S4, no clicks, no rubs, no murmurs ABD:  Flat, positive bowel sounds normal in frequency in pitch, no bruits, no rebound, no guarding, no midline pulsatile mass, no hepatomegaly, no splenomegaly EXT:  2 plus pulses throughout, no edema, no cyanosis no clubbing SKIN:  No rashes no nodules NEURO:  Cranial nerves II through XII grossly intact, motor grossly intact throughout PSYCH:  Cognitively intact, oriented to person place and time   EKG:  EKG is ordered today. The ekg ordered today demonstrates sinus bradycardia rate 56 bpm.  One PVC.   Recent Labs: 01/10/2015: ALT 20 01/11/2015: BUN 19; Creatinine, Ser 1.31*; Hemoglobin 9.0*; Platelets 268; Potassium 5.0; Sodium 133*    Lipid Panel    Component Value Date/Time   CHOL 146 01/31/2012 1007   TRIG 125.0 01/31/2012 1007   HDL 54.50 01/31/2012 1007   CHOLHDL 3 01/31/2012 1007   VLDL 25.0 01/31/2012 1007   LDLCALC 67 01/31/2012 1007      Wt Readings from Last 3 Encounters:  12/07/15 194 lb (87.998 kg)  06/04/15 191 lb 12.8 oz (87 kg)  01/08/15 189 lb 1.6 oz (85.775 kg)      ASSESSMENT AND PLAN:  # CAD s/p CABG: Stable.  Continue aspirin and clopidogrel indefinitely.  These can be held as necessary for procedures.  Continue metoprolol and  atorvastatin.    # Hypertensive heart disease: Goal BP <150/90.  Therefore, he is at goal.  Continue metoprolol, amlodipine, and lisinopril.  # Hyperlipidemia:  Continue atorvastatin. Lipids are managed by Dr. Rex Kras.  # Aortic stenosis s/p bioprosthetic AVR: Valve is stable.  Last echo 2014.  He has no heart failure symptoms.  Reinforced the need for antibiotic prophylaxis.    Current medicines are reviewed at length with the patient today.  The patient does not have concerns regarding medicines.  The following changes have been made:  no change  Labs/ tests ordered today include:  Orders Placed This Encounter  Procedures  . EKG 12-Lead     Disposition:   FU with Ritchie Klee C. Oval Linsey, MD, [facility] in 6 months.    Time spent: 30 minutes-Greater than 50% of this time was spent in counseling, explanation of diagnosis, planning of further management, and coordination of care.  This note was written with the assistance of speech recognition software.  Please excuse any transcriptional errors.  Signed, Bria Sparr C. Oval Linsey, MD, [facility]  12/07/2015 11:59 AM    [place]

## 2015-12-07 ENCOUNTER — Ambulatory Visit (INDEPENDENT_AMBULATORY_CARE_PROVIDER_SITE_OTHER): Payer: Medicare Other | Admitting: Cardiovascular Disease

## 2015-12-07 ENCOUNTER — Encounter: Payer: Self-pay | Admitting: Cardiovascular Disease

## 2015-12-07 VITALS — BP 146/66 | HR 56 | Ht 67.0 in | Wt 194.0 lb

## 2015-12-07 DIAGNOSIS — I2510 Atherosclerotic heart disease of native coronary artery without angina pectoris: Secondary | ICD-10-CM

## 2015-12-07 DIAGNOSIS — Z953 Presence of xenogenic heart valve: Secondary | ICD-10-CM

## 2015-12-07 DIAGNOSIS — E785 Hyperlipidemia, unspecified: Secondary | ICD-10-CM

## 2015-12-07 DIAGNOSIS — Z951 Presence of aortocoronary bypass graft: Secondary | ICD-10-CM

## 2015-12-07 DIAGNOSIS — I119 Hypertensive heart disease without heart failure: Secondary | ICD-10-CM | POA: Diagnosis not present

## 2015-12-07 DIAGNOSIS — Z954 Presence of other heart-valve replacement: Secondary | ICD-10-CM

## 2015-12-07 NOTE — Patient Instructions (Signed)
Your physician wants you to follow-up in: 6 MONTHS OR SOONER IF NEEDED. You will receive a reminder letter in the mail two months in advance. If you don't receive a letter, please call our office to schedule the follow-up appointment.   If you need a refill on your cardiac medications before your next appointment, please call your pharmacy.   

## 2016-04-05 ENCOUNTER — Other Ambulatory Visit: Payer: Self-pay | Admitting: *Deleted

## 2016-04-06 MED ORDER — METOPROLOL SUCCINATE ER 50 MG PO TB24: 50.0000 mg | ORAL_TABLET | Freq: Every day | ORAL | 0 refills | Status: DC

## 2016-04-06 NOTE — Telephone Encounter (Signed)
REFILL 

## 2016-04-17 DIAGNOSIS — H35371 Puckering of macula, right eye: Secondary | ICD-10-CM | POA: Diagnosis not present

## 2016-04-17 DIAGNOSIS — Z961 Presence of intraocular lens: Secondary | ICD-10-CM | POA: Diagnosis not present

## 2016-04-26 DIAGNOSIS — L820 Inflamed seborrheic keratosis: Secondary | ICD-10-CM | POA: Diagnosis not present

## 2016-04-26 DIAGNOSIS — D1801 Hemangioma of skin and subcutaneous tissue: Secondary | ICD-10-CM | POA: Diagnosis not present

## 2016-05-04 ENCOUNTER — Encounter: Payer: Self-pay | Admitting: *Deleted

## 2016-06-07 ENCOUNTER — Encounter: Payer: Self-pay | Admitting: Cardiovascular Disease

## 2016-06-07 ENCOUNTER — Ambulatory Visit (INDEPENDENT_AMBULATORY_CARE_PROVIDER_SITE_OTHER): Payer: Medicare Other | Admitting: Cardiovascular Disease

## 2016-06-07 VITALS — BP 125/63 | HR 57 | Ht 67.0 in | Wt 193.0 lb

## 2016-06-07 DIAGNOSIS — I2510 Atherosclerotic heart disease of native coronary artery without angina pectoris: Secondary | ICD-10-CM | POA: Diagnosis not present

## 2016-06-07 DIAGNOSIS — I119 Hypertensive heart disease without heart failure: Secondary | ICD-10-CM | POA: Diagnosis not present

## 2016-06-07 DIAGNOSIS — Z952 Presence of prosthetic heart valve: Secondary | ICD-10-CM

## 2016-06-07 DIAGNOSIS — R197 Diarrhea, unspecified: Secondary | ICD-10-CM

## 2016-06-07 DIAGNOSIS — E7800 Pure hypercholesterolemia, unspecified: Secondary | ICD-10-CM | POA: Diagnosis not present

## 2016-06-07 MED ORDER — NITROGLYCERIN 0.4 MG/SPRAY TL SOLN: 1.0000 | 12 refills | Status: DC | PRN

## 2016-06-07 NOTE — Patient Instructions (Addendum)
Medications: continue on your current medications as directed. Please refer to the Current Medication list given to you today.  Lab work: NONE  Testing: NONE  Your physician wants you to follow-up in: 1 year You will receive a reminder letter in the mail two months in advance. If you don't receive a letter, please call our office to schedule the follow-up appointment.

## 2016-06-07 NOTE — Progress Notes (Signed)
Cardiology Office Note   Date:  06/07/2016   ID:  Randy Chen, DOB 1927-09-03, MRN [id number]  PCP:  Gennette Pac, MD  Cardiologist:   Skeet Latch, MD   Chief Complaint  Patient presents with  . Follow-up    6 months; Pt states no Sx.      History of Present Illness: Randy Chen is a 82 y.o. male with hypertension, hyperlipidemia, CAD s/p CABG (1987), aortic stenosis s/p bioprosthetic AVR (2006) and prostate cancer who presents for follow up.  Randy Chen was previously a patient of Dr. Mare Ferrari.  He had an echo 11/2012 that revealed LVEF 55-60% with a well-functioning AVR.  He had an episode of chest pain 05/2014 and underwent nuclear stress testing 05/2015 that was negative for ischemia.  Since his last appointment Randy Chen continues to do well.  He denies chest pain or shortness of breath.  He denies lower extremity edema, orthopnea or PND.  He swims three times per week and does water aerobics.  He denies any exertional symptoms. He notes very mild lower extremity edema that improves with elevation. He also continues to have sporadic episodes of diarrhea but typically occur at night. He is unsure if it is related to something he is eating or his medications. He is otherwise without complaint.   Past Medical History:  Diagnosis Date  . Aortic stenosis   . Coronary artery disease    CABG x5 - 1987  . History of atherosclerotic cardiovascular disease   . History of prostatectomy   . Hyperlipidemia   . Hypertension     Past Surgical History:  Procedure Laterality Date  . CARDIAC CATHETERIZATION     EF of 66%  . CARDIAC VALVE REPLACEMENT  2006  . CORONARY ARTERY BYPASS GRAFT  1987   x5  . I&D EXTREMITY Left 01/10/2015   Procedure: DEBRIDEMENT ANKLE PLACEMENT ANTIBIOTIC BEADS AND WOUND VAC;  Surgeon: [name], MD;  Location: [facility];  Service: Orthopedics;  Laterality: Left;     Current Outpatient Prescriptions  Medication Sig Dispense Refill   . acetaminophen (TYLENOL) 500 MG tablet Take 1,000 mg by mouth 2 (two) times daily as needed for mild pain.    [name] amLODipine (NORVASC) 10 MG tablet Take 0.5 tablets (5 mg total) by mouth daily. 90 tablet 1  . aspirin EC 81 MG tablet Take 81 mg by mouth every evening.    [name] atorvastatin (LIPITOR) 80 MG tablet Take 0.5 tablets (40 mg total) by mouth daily. 45 tablet 2  . Calcium Carb-Cholecalciferol (CALCIUM 1000 + D PO) Take 1,000 mg by mouth daily.    . Cholecalciferol (VITAMIN D) 2000 UNITS CAPS Take 2,000 Units by mouth daily.    . clopidogrel (PLAVIX) 75 MG tablet Take 1 tablet (75 mg total) by mouth daily. 90 tablet 0  . folic acid (FOLVITE) A999333 MCG tablet Take 400 mcg by mouth daily.     . Glucosamine-Chondroit-Vit C-Mn (GLUCOSAMINE 1500 COMPLEX PO) Take 1,500 mg by mouth daily.    [name] lisinopril (PRINIVIL,ZESTRIL) 40 MG tablet Take 40 mg by mouth 2 (two) times daily.     . metoprolol succinate (TOPROL-XL) 50 MG 24 hr tablet Take 1 tablet (50 mg total) by mouth daily. KEEP OV. 90 tablet 0  . Multiple Vitamin (MULTIVITAMIN WITH MINERALS) TABS Take 1 tablet by mouth daily. Theragran    . vitamin C (ASCORBIC ACID) 500 MG tablet Take 500 mg by mouth daily.    [name]  nitroGLYCERIN (NITROLINGUAL) 0.4 MG/SPRAY spray Place 1 spray under the tongue every 5 (five) minutes x 3 doses as needed for chest pain. 12 g 12   No current facility-administered medications for this visit.     Allergies:   Patient has no known allergies.    Social History:  The patient  reports that he quit smoking about 60 years ago. He does not have any smokeless tobacco history on file. He reports that he does not drink alcohol or use drugs.   Family History:  The patient's family history includes Congestive Heart Failure in his sister and sister; Heart attack in his brother; Heart disease in his father; Heart failure in his mother.    ROS:  Please see the history of present illness.   Otherwise, review of systems are positive  for none.   All other systems are reviewed and negative.    PHYSICAL EXAM: VS:  BP 125/63   Pulse (!) 57   Ht 5\' 7"  (1.702 m)   Wt 87.5 kg (193 lb)   BMI 30.23 kg/m  , BMI Body mass index is 30.23 kg/m. GENERAL:  Well appearing HEENT:  Pupils equal round and reactive, fundi not visualized, oral mucosa unremarkable NECK:  No jugular venous distention, waveform within normal limits, carotid upstroke brisk and symmetric, no bruits LYMPHATICS:  No cervical adenopathy LUNGS:  Clear to auscultation bilaterally HEART:  RRR.  PMI not displaced or sustained,S1 and S2 within normal limits, no S3, no S4, no clicks, no rubs, no murmurs ABD:  Flat, positive bowel sounds normal in frequency in pitch, no bruits, no rebound, no guarding, no midline pulsatile mass, no hepatomegaly, no splenomegaly EXT:  2 plus pulses throughout, no edema, no cyanosis no clubbing SKIN:  No rashes no nodules NEURO:  Cranial nerves II through XII grossly intact, motor grossly intact throughout PSYCH:  Cognitively intact, oriented to person place and time   EKG:  EKG is ordered today. The ekg ordered today demonstrates sinus bradycardia rate 57 bpm.  Recent Labs: No results found for requested labs within last 8760 hours.    Lipid Panel    Component Value Date/Time   CHOL 146 01/31/2012 1007   TRIG 125.0 01/31/2012 1007   HDL 54.50 01/31/2012 1007   CHOLHDL 3 01/31/2012 1007   VLDL 25.0 01/31/2012 1007   LDLCALC 67 01/31/2012 1007      Wt Readings from Last 3 Encounters:  06/07/16 87.5 kg (193 lb)  12/07/15 88 kg (194 lb)  06/04/15 87 kg (191 lb 12.8 oz)      ASSESSMENT AND PLAN:  # CAD s/p CABG: Stable.  Continue aspirin and clopidogrel indefinitely.  These can be held as necessary for procedures.  Continue metoprolol and atorvastatin.  We will switch his sublingual nitroglycerin to nitroglycerin spray prn.   # Hypertensive heart disease:  Blood pressure is well-controlled on metoprolol, amlodipine,  and lisinopril.  # Hyperlipidemia:  Continue atorvastatin. Lipids are managed by Dr. Rex Kras. Of note, atorvastatin can cause diarrhea. However, it isn't happening every night and he typically takes no medication in the morning. He is unsure whether he would want to change this medication as he has been stable on it for so long. He potentially switched to rosuvastatin. We will discuss this at his follow-up appointment.    # Aortic stenosis s/p bioprosthetic AVR: Valve is stable.  Last echo 2014.  He has no heart failure symptoms.  Reinforced the need for antibiotic prophylaxis.    Current  medicines are reviewed at length with the patient today.  The patient does not have concerns regarding medicines.  The following changes have been made:  no change  Labs/ tests ordered today include:   Orders Placed This Encounter  Procedures  . EKG 12-Lead     Disposition:   FU with Lehua Flores C. Oval Linsey, MD, [facility] in 1 year.    Time spent: 30 minutes-Greater than 50% of this time was spent in counseling, explanation of diagnosis, planning of further management, and coordination of care.  This note was written with the assistance of speech recognition software.  Please excuse any transcriptional errors.  Signed, Damiya Sandefur C. Oval Linsey, MD, [facility]  06/07/2016 10:58 AM    [facility]

## 2016-06-10 ENCOUNTER — Emergency Department (HOSPITAL_BASED_OUTPATIENT_CLINIC_OR_DEPARTMENT_OTHER)
Admission: EM | Admit: 2016-06-10 | Discharge: 2016-06-10 | Disposition: A | Discharge status: Home / Self Care | Payer: Medicare Other | Attending: Emergency Medicine | Admitting: Emergency Medicine

## 2016-06-10 ENCOUNTER — Encounter (HOSPITAL_BASED_OUTPATIENT_CLINIC_OR_DEPARTMENT_OTHER): Payer: Self-pay | Admitting: Emergency Medicine

## 2016-06-10 ENCOUNTER — Emergency Department (HOSPITAL_BASED_OUTPATIENT_CLINIC_OR_DEPARTMENT_OTHER): Payer: Medicare Other

## 2016-06-10 DIAGNOSIS — Y939 Activity, unspecified: Secondary | ICD-10-CM | POA: Insufficient documentation

## 2016-06-10 DIAGNOSIS — Z87891 Personal history of nicotine dependence: Secondary | ICD-10-CM | POA: Diagnosis not present

## 2016-06-10 DIAGNOSIS — Z79899 Other long term (current) drug therapy: Secondary | ICD-10-CM | POA: Insufficient documentation

## 2016-06-10 DIAGNOSIS — Z7982 Long term (current) use of aspirin: Secondary | ICD-10-CM | POA: Diagnosis not present

## 2016-06-10 DIAGNOSIS — I2510 Atherosclerotic heart disease of native coronary artery without angina pectoris: Secondary | ICD-10-CM | POA: Insufficient documentation

## 2016-06-10 DIAGNOSIS — I10 Essential (primary) hypertension: Secondary | ICD-10-CM | POA: Diagnosis not present

## 2016-06-10 DIAGNOSIS — M7022 Olecranon bursitis, left elbow: Secondary | ICD-10-CM | POA: Insufficient documentation

## 2016-06-10 DIAGNOSIS — M7989 Other specified soft tissue disorders: Secondary | ICD-10-CM | POA: Diagnosis not present

## 2016-06-10 MED ORDER — PREDNISONE 10 MG (21) PO TBPK: 10.0000 mg | ORAL_TABLET | Freq: Every day | ORAL | 0 refills | Status: DC

## 2016-06-10 NOTE — ED Provider Notes (Signed)
[place] DEPT [facility] Provider Note   CSN: [id number] Arrival date & time: 06/10/16  1244 By signing my name below, I, Randy Chen, attest that this documentation has been prepared under the direction and in the presence of non-physician practitioner, Arlean Hopping, PA-C. Electronically Signed: Dyke Chen, Scribe. 06/10/2016. 4:28 PM.    History   Chief Complaint Chief Complaint  Patient presents with  . Joint Swelling    HPI Randy Chen is a 82 y.o. male, with a hx of HTN, HLD and CAD, and who presents to the Emergency Department complaining of sudden onset, progressively improving swelling to left elbow onset this morning. No alleviating or modifying factors noted. No treatments tried PTA. Pt denies any recent injury or trauma to the area. He denies any pain to the area, neuro deficits, fever/chills, or any other complaints.  PCP: Randy Pac, MD   The history is provided by the patient. No language interpreter was used.    Past Medical History:  Diagnosis Date  . Aortic stenosis   . Coronary artery disease    CABG x5 - 1987  . History of atherosclerotic cardiovascular disease   . History of prostatectomy   . Hyperlipidemia   . Hypertension     Patient Active Problem List   Diagnosis Date Noted  . Cellulitis of left lower leg 01/08/2015  . Cellulitis 01/08/2015  . CAD (coronary artery disease) 01/08/2015  . S/P aortic valve replacement with bioprosthetic valve 01/08/2015  . Chest discomfort 05/13/2014  . Chest pain 05/04/2014  . Diverticulosis 05/13/2013  . Vertigo 05/13/2013  . Colitis 06/23/2012  . History of prosthetic aortic valve 11/18/2010  . Benign hypertensive heart disease without heart failure 11/18/2010  . Hypercholesterolemia 11/18/2010  . History of prostate cancer 11/18/2010    Past Surgical History:  Procedure Laterality Date  . CARDIAC CATHETERIZATION     EF of 66%  . CARDIAC VALVE REPLACEMENT  2006  . CORONARY ARTERY  BYPASS GRAFT  1987   x5  . I&D EXTREMITY Left 01/10/2015   Procedure: DEBRIDEMENT ANKLE PLACEMENT ANTIBIOTIC BEADS AND WOUND VAC;  Surgeon: [name], MD;  Location: [facility];  Service: Orthopedics;  Laterality: Left;      Home Medications    Prior to Admission medications   Medication Sig Start Date End Date Taking? Authorizing Provider  acetaminophen (TYLENOL) 500 MG tablet Take 1,000 mg by mouth 2 (two) times daily as needed for mild pain.    Historical Provider, MD  amLODipine (NORVASC) 10 MG tablet Take 0.5 tablets (5 mg total) by mouth daily. 08/19/15   Randy Coco, MD  aspirin EC 81 MG tablet Take 81 mg by mouth every evening.    Historical Provider, MD  atorvastatin (LIPITOR) 80 MG tablet Take 0.5 tablets (40 mg total) by mouth daily. 12/02/15   Skeet Latch, MD  Calcium Carb-Cholecalciferol (CALCIUM 1000 + D PO) Take 1,000 mg by mouth daily.    Historical Provider, MD  Cholecalciferol (VITAMIN D) 2000 UNITS CAPS Take 2,000 Units by mouth daily.    Historical Provider, MD  clopidogrel (PLAVIX) 75 MG tablet Take 1 tablet (75 mg total) by mouth daily. 11/29/15   Skeet Latch, MD  folic acid (FOLVITE) A999333 MCG tablet Take 400 mcg by mouth daily.     Historical Provider, MD  Glucosamine-Chondroit-Vit C-Mn (GLUCOSAMINE 1500 COMPLEX PO) Take 1,500 mg by mouth daily.    Historical Provider, MD  lisinopril (PRINIVIL,ZESTRIL) 40 MG tablet Take 40 mg by mouth 2 (two)  times daily.     Historical Provider, MD  metoprolol succinate (TOPROL-XL) 50 MG 24 hr tablet Take 1 tablet (50 mg total) by mouth daily. KEEP OV. 04/06/16   Skeet Latch, MD  Multiple Vitamin (MULTIVITAMIN WITH MINERALS) TABS Take 1 tablet by mouth daily. [place]    Historical Provider, MD  nitroGLYCERIN (NITROLINGUAL) 0.4 MG/SPRAY spray Place 1 spray under the tongue every 5 (five) minutes x 3 doses as needed for chest pain. 06/07/16   Skeet Latch, MD  predniSONE (STERAPRED UNI-PAK 21 TAB) 10 MG (21) TBPK  tablet Take 1 tablet (10 mg total) by mouth daily. Take 6 tabs day 1, 5 tabs day 2, 4 tabs day 3, 3 tabs day 4, 2 tabs day 5, and 1 tab on day 6. 06/10/16   Randy Gorby C Nyheim Seufert, PA-C  vitamin C (ASCORBIC ACID) 500 MG tablet Take 500 mg by mouth daily.    Historical Provider, MD    Family History Family History  Problem Relation Age of Onset  . Heart disease Father   . Heart failure Mother   . Heart attack Brother   . Congestive Heart Failure Sister   . Congestive Heart Failure Sister     Social History Social History  Substance Use Topics  . Smoking status: Former Smoker    Quit date: 11/18/1955  . Smokeless tobacco: Never Used  . Alcohol use No     Allergies   Patient has no known allergies.   Review of Systems Review of Systems  Musculoskeletal: Positive for joint swelling. Negative for myalgias.  Neurological: Negative for weakness and numbness.   Physical Exam Updated Vital Signs BP (!) 127/53   Pulse 62   Temp 98.2 F (36.8 C) (Oral)   Resp 18   Ht 5\' 7"  (1.702 m)   Wt 87.1 kg   SpO2 97%   BMI 30.07 kg/m   Physical Exam  Constitutional: He appears well-developed and well-nourished. No distress.  HENT:  Head: Normocephalic and atraumatic.  Eyes: Conjunctivae are normal.  Neck: Neck supple.  Cardiovascular: Normal rate, regular rhythm and intact distal pulses.   Pulmonary/Chest: Effort normal.  Musculoskeletal: Normal range of motion. He exhibits no tenderness or deformity.  Swelling to the posterior left elbow, FROM. No tenderness, erythema, or increased warmth.    Neurological: He is alert. No sensory deficit.  Grip strength 5/5 bilaterally. Flexion and extension at elbow is 5/5  Skin: Skin is warm and dry. Capillary refill takes less than 2 seconds. He is not diaphoretic.  Psychiatric: He has a normal mood and affect. His behavior is normal.  Nursing note and vitals reviewed.  ED Treatments / Results  DIAGNOSTIC STUDIES:  Oxygen Saturation is 97% on RA,  normal by my interpretation.    COORDINATION OF CARE:  4:17 PM Discussed treatment plan with pt at bedside and pt agreed to plan.  Labs (all labs ordered are listed, but only abnormal results are displayed) Labs Reviewed - No data to display  EKG  EKG Interpretation None       Radiology Dg Elbow Complete Left  Result Date: 06/10/2016 CLINICAL DATA:  Swelling in the posterior elbow. EXAM: LEFT ELBOW - COMPLETE 3+ VIEW COMPARISON:  None. FINDINGS: Left elbow is located without a fracture. Degenerative changes involving the coronoid process of the elbow. There is no evidence for an elbow joint effusion but there is soft tissue swelling along the olecranon. IMPRESSION: No acute bone abnormality involving the left elbow. Soft tissue swelling along the  olecranon. Soft tissue swelling is nonspecific but could be associated with bursitis. Electronically Signed   By: Markus Daft M.D.   On: 06/10/2016 13:50    Procedures Procedures (including critical care time)  Medications Ordered in ED Medications - No data to display   Initial Impression / Assessment and Plan / ED Course  I have reviewed the triage vital signs and the nursing notes.  Pertinent labs & imaging results that were available during my care of the patient were reviewed by me and considered in my medical decision making (see chart for details).  Clinical Course     Patient presents with posterior left elbow swelling. Physical exam findings consistent with olecranon bursitis. My suspicion for septic joint is low. The patient was given instructions for home care as well as return precautions. Patient voices understanding of these instructions, accepts the plan, and is comfortable with discharge.     Final Clinical Impressions(s) / ED Diagnoses   Final diagnoses:  Olecranon bursitis of left elbow    New Prescriptions Discharge Medication List as of 06/10/2016  4:39 PM    START taking these medications   Details    predniSONE (STERAPRED UNI-PAK 21 TAB) 10 MG (21) TBPK tablet Take 1 tablet (10 mg total) by mouth daily. Take 6 tabs day 1, 5 tabs day 2, 4 tabs day 3, 3 tabs day 4, 2 tabs day 5, and 1 tab on day 6., Starting Sat 06/10/2016, Print      I personally performed the services described in this documentation, which was scribed in my presence. The recorded information has been reviewed and is accurate.   Lorayne Bender, PA-C 06/12/16 0031    Lorayne Bender, PA-C 06/12/16 0032    Leo Grosser, MD 06/12/16 [phone]

## 2016-06-10 NOTE — ED Triage Notes (Signed)
Swelling to L elbow, area is soft and feels like fluid, pt noticed it today. Denies pain.

## 2016-06-10 NOTE — Discharge Instructions (Signed)
The swelling in the elbow is suspected to be bursitis. Take 400 mg of ibuprofen every 6 hours for the next 3 days. Take this medication with food to avoid upset stomach. Keep the elbow elevated to the level of the heart whenever possible. Apply warm compresses at least twice a day. Continue to flex and extend the elbow to work the fluid out of the joint. Follow-up with your primary care provider should symptoms fail to resolve.

## 2016-06-28 ENCOUNTER — Telehealth: Payer: Self-pay | Admitting: Cardiovascular Disease

## 2016-06-28 MED ORDER — NITROGLYCERIN 0.4 MG SL SUBL: 0.4000 mg | SUBLINGUAL_TABLET | SUBLINGUAL | 3 refills | Status: DC | PRN

## 2016-06-28 NOTE — Telephone Encounter (Signed)
Returned call to patient-patient states he was recently given a new rx for NTG spray but his insurance will not cover it.  Pt requesting to go back to Nitrostat as previously prescribed.    New rx sent to pharmacy, med list updated. \  Pt aware and verbalized understanding.

## 2016-06-28 NOTE — Telephone Encounter (Signed)
New Message  Pt voiced wanting to speak with nurse.  Please f/u with pt

## 2016-07-20 ENCOUNTER — Other Ambulatory Visit: Payer: Self-pay

## 2016-07-20 MED ORDER — METOPROLOL SUCCINATE ER 50 MG PO TB24: 50.0000 mg | ORAL_TABLET | Freq: Every day | ORAL | 2 refills | Status: DC

## 2016-08-02 DIAGNOSIS — M7022 Olecranon bursitis, left elbow: Secondary | ICD-10-CM | POA: Diagnosis not present

## 2016-09-05 ENCOUNTER — Other Ambulatory Visit: Payer: Self-pay

## 2016-09-05 MED ORDER — CLOPIDOGREL BISULFATE 75 MG PO TABS: 75.0000 mg | ORAL_TABLET | Freq: Every day | ORAL | 3 refills | Status: DC

## 2016-10-10 ENCOUNTER — Telehealth: Payer: Self-pay | Admitting: Cardiovascular Disease

## 2016-10-10 MED ORDER — AMLODIPINE BESYLATE 10 MG PO TABS: 5.0000 mg | ORAL_TABLET | Freq: Every day | ORAL | 1 refills | Status: DC

## 2016-10-10 NOTE — Telephone Encounter (Signed)
Rx sent to pharmacy   

## 2016-10-10 NOTE — Telephone Encounter (Signed)
New Message     *STAT* If patient is at the pharmacy, call can be transferred to refill team.   1. Which medications need to be refilled? (please list name of each medication and dose if known) amLODipine (NORVASC) 10 MG tablet  2. Which pharmacy/location (including street and city if local pharmacy) is medication to be sent to? [facility] [place], [place]  3. Do they need a 30 day or 90 day supply? 90 day supply

## 2016-10-19 DIAGNOSIS — I10 Essential (primary) hypertension: Secondary | ICD-10-CM | POA: Diagnosis not present

## 2016-10-19 DIAGNOSIS — Z0000 Encounter for general adult medical examination without abnormal findings: Secondary | ICD-10-CM | POA: Diagnosis not present

## 2016-10-19 DIAGNOSIS — R7301 Impaired fasting glucose: Secondary | ICD-10-CM | POA: Diagnosis not present

## 2016-10-19 DIAGNOSIS — E785 Hyperlipidemia, unspecified: Secondary | ICD-10-CM | POA: Diagnosis not present

## 2017-01-10 ENCOUNTER — Other Ambulatory Visit: Payer: Self-pay | Admitting: Cardiovascular Disease

## 2017-01-10 NOTE — Telephone Encounter (Signed)
Please review for refill, Thanks !  

## 2017-04-03 DIAGNOSIS — Z23 Encounter for immunization: Secondary | ICD-10-CM | POA: Diagnosis not present

## 2017-04-10 ENCOUNTER — Other Ambulatory Visit: Payer: Self-pay | Admitting: *Deleted

## 2017-04-10 ENCOUNTER — Other Ambulatory Visit: Payer: Self-pay | Admitting: Cardiovascular Disease

## 2017-04-10 MED ORDER — METOPROLOL SUCCINATE ER 50 MG PO TB24: 50.0000 mg | ORAL_TABLET | Freq: Every day | ORAL | 0 refills | Status: DC

## 2017-04-10 MED ORDER — ATORVASTATIN CALCIUM 80 MG PO TABS: 80.0000 mg | ORAL_TABLET | Freq: Every day | ORAL | 0 refills | Status: DC

## 2017-04-10 NOTE — Telephone Encounter (Signed)
°*  STAT* If patient is at the pharmacy, call can be transferred to refill team.   1. Which medications need to be refilled? (please list name of each medication and dose if known) Metoprolol and Atorvastatin  2. Which pharmacy/location (including street and city if local pharmacy) is medication to be sent to?[facility] [phone]  3. Do they need a 30 day or 90 day supply? 90 and refills

## 2017-04-26 ENCOUNTER — Ambulatory Visit: Payer: Medicare Other | Admitting: Cardiology

## 2017-04-26 ENCOUNTER — Encounter: Payer: Self-pay | Admitting: Cardiology

## 2017-04-26 VITALS — BP 155/70 | HR 56 | Ht 67.0 in | Wt 196.4 lb

## 2017-04-26 DIAGNOSIS — I480 Paroxysmal atrial fibrillation: Secondary | ICD-10-CM

## 2017-04-26 DIAGNOSIS — R5383 Other fatigue: Secondary | ICD-10-CM | POA: Diagnosis not present

## 2017-04-26 DIAGNOSIS — I739 Peripheral vascular disease, unspecified: Secondary | ICD-10-CM | POA: Insufficient documentation

## 2017-04-26 MED ORDER — METOPROLOL TARTRATE 25 MG PO TABS: 12.5000 mg | ORAL_TABLET | Freq: Two times a day (BID) | ORAL | 3 refills | Status: DC

## 2017-04-26 MED ORDER — METOPROLOL SUCCINATE ER 25 MG PO TB24: 25.0000 mg | ORAL_TABLET | Freq: Every day | ORAL | 3 refills | Status: DC

## 2017-04-26 MED ORDER — DILTIAZEM HCL ER COATED BEADS 180 MG PO CP24: 180.0000 mg | ORAL_CAPSULE | Freq: Every day | ORAL | 3 refills | Status: DC

## 2017-04-26 MED ORDER — METOPROLOL SUCCINATE ER 25 MG PO TB24: 25.0000 mg | ORAL_TABLET | Freq: Every day | ORAL | 11 refills | Status: DC

## 2017-04-26 NOTE — Assessment & Plan Note (Signed)
Pt complains of generalized fatigue- his HR is 56

## 2017-04-26 NOTE — Assessment & Plan Note (Signed)
Bilateral calf tightness when walking. Bilateral femoral bruits on exam and decreased peripheral pulses in LE

## 2017-04-26 NOTE — Assessment & Plan Note (Signed)
CABG in 1987-low risk Myoview in 2015 with normal LVF

## 2017-04-26 NOTE — Assessment & Plan Note (Signed)
Tissue AVR 2006. Echo 2014 looked good

## 2017-04-26 NOTE — Patient Instructions (Addendum)
Medication Instructions:  DECREASE  METOPROLOL TO 25MG  DAILY If you need a refill on your cardiac medications before your next appointment, please call your pharmacy.  Labwork: CBC BMP TSH TODAY HERE IN OUR OFFICE AT [facility]  Testing/Procedures: Your physician has requested that you have a BILATERAL lower extremity arterial duplex. This test is an ultrasound of the arteries in the legs or arms. It looks at arterial blood flow in the legs and arms. Allow one hour for Lower and Upper Arterial scans. There are no restrictions or special instructions   Follow-Up: Your physician wants you to follow-up in: 3-4 WEEKS WITH DR [facility].  Thank you for choosing [facility] at [facility]!!

## 2017-04-26 NOTE — Progress Notes (Signed)
04/26/2017 Randy Chen   31-Jul-1927  [id number]  Primary Physician [name], MD Primary Cardiologist: Dr Oval Linsey  HPI:  83 y/o male with a history of remote CABG in 1987 and tissue AVR in 2006. He has done remarkably well. He was to see Dr Oval Linsey in Dec for annual check up but has noticed some generalized fatigue and asked for this appointment. He denies chest pain or dyspnea. No syncope or pre syncope. He did say he has noted both his calf get "tight" when he walks. In the office his EKG shows sinus bradycardia at 55. He has femoral bruits and diminished LE distal pulses on exam.    Current Outpatient Medications  Medication Sig Dispense Refill  . acetaminophen (TYLENOL) 500 MG tablet Take 1,000 mg by mouth 2 (two) times daily as needed for mild pain.    [name] amLODipine (NORVASC) 10 MG tablet Take 0.5 tablets (5 mg total) by mouth daily. 90 tablet 1  . aspirin EC 81 MG tablet Take 81 mg by mouth every evening.    [name] atorvastatin (LIPITOR) 80 MG tablet Take 1 tablet (80 mg total) by mouth daily at 6 PM. Due for follow up appt 90 tablet 0  . Calcium Carb-Cholecalciferol (CALCIUM 1000 + D PO) Take 1,000 mg by mouth daily.    . Cholecalciferol (VITAMIN D) 2000 UNITS CAPS Take 2,000 Units by mouth daily.    . clopidogrel (PLAVIX) 75 MG tablet Take 1 tablet (75 mg total) by mouth daily. 90 tablet 3  . folic acid (FOLVITE) 401 MCG tablet Take 400 mcg by mouth daily.     . Glucosamine-Chondroit-Vit C-Mn (GLUCOSAMINE 1500 COMPLEX PO) Take 1,500 mg by mouth daily.    [name] lisinopril (PRINIVIL,ZESTRIL) 40 MG tablet Take 40 mg by mouth 2 (two) times daily.     . metoprolol succinate (TOPROL-XL) 50 MG 24 hr tablet Take 1 tablet (50 mg total) by mouth daily. KEEP OV. 90 tablet 0  . Multiple Vitamin (MULTIVITAMIN WITH MINERALS) TABS Take 1 tablet by mouth daily. Theragran    . vitamin C (ASCORBIC ACID) 500 MG tablet Take 500 mg by mouth daily.    . nitroGLYCERIN (NITROSTAT) 0.4 MG SL tablet  Place 1 tablet (0.4 mg total) under the tongue every 5 (five) minutes as needed for chest pain. 25 tablet 3   No current facility-administered medications for this visit.     No Known Allergies  Past Medical History:  Diagnosis Date  . Aortic stenosis   . Coronary artery disease    CABG x5 - 1987  . History of atherosclerotic cardiovascular disease   . History of prostatectomy   . Hyperlipidemia   . Hypertension     Social History   Socioeconomic History  . Marital status: Married    Spouse name: Not on file  . Number of children: Not on file  . Years of education: Not on file  . Highest education level: Not on file  Social Needs  . Financial resource strain: Not on file  . Food insecurity - worry: Not on file  . Food insecurity - inability: Not on file  . Transportation needs - medical: Not on file  . Transportation needs - non-medical: Not on file  Occupational History  . Not on file  Tobacco Use  . Smoking status: Former Smoker    Last attempt to quit: 11/18/1955    Years since quitting: 61.4  . Smokeless tobacco: Never Used  Substance and Sexual Activity  .  Alcohol use: No  . Drug use: No  . Sexual activity: Not on file  Other Topics Concern  . Not on file  Social History Narrative  . Not on file     Family History  Problem Relation Age of Onset  . Heart disease Father   . Heart failure Mother   . Heart attack Brother   . Congestive Heart Failure Sister   . Congestive Heart Failure Sister      Review of Systems: General: negative for chills, fever, night sweats or weight changes.  Cardiovascular: negative for chest pain, dyspnea on exertion, edema, orthopnea, palpitations, paroxysmal nocturnal dyspnea or shortness of breath Dermatological: negative for rash Respiratory: negative for cough or wheezing Urologic: negative for hematuria Abdominal: negative for nausea, vomiting, diarrhea, bright red blood per rectum, melena, or hematemesis Neurologic:  negative for visual changes, syncope, or dizziness All other systems reviewed and are otherwise negative except as noted above.    Blood pressure (!) 155/70, pulse (!) 56, height 5\' 7"  (1.702 m), weight 196 lb 6.4 oz (89.1 kg).  General appearance: alert, cooperative, no distress and looks youger than 89! Neck: no carotid bruit and no JVD Lungs: clear to auscultation bilaterally Heart: regular rate and rhythm and soft systolic murmur AOV, preseved S2 Extremities: extremities normal, atraumatic, no cyanosis or edema Pulses: bilateral femoral bruits, diminnished distal LE pulses Skin: Skin color, texture, turgor normal. No rashes or lesions Neurologic: Grossly normal  EKG NSR, SB, short PR  ASSESSMENT AND PLAN:   Fatigue Pt complains of generalized fatigue- his HR is 56  Claudication of both lower extremities (HCC) Bilateral calf tightness when walking. Bilateral femoral bruits on exam and decreased peripheral pulses in LE  Hx of CABG CABG in 1987-low risk Myoview in 2015 with normal LVF  S/P aortic valve replacement with bioprosthetic valve Tissue AVR 2006. Echo 2014 looked good   PLAN  I suggested we cut his Toprol to 25 mg, check labs, and check LEA dopplers. F/U with Dr Oval Linsey 3-4 weeks.   [name] PA-C 04/26/2017 12:18 PM

## 2017-04-27 LAB — BASIC METABOLIC PANEL
BUN/Creatinine Ratio: 20 (ref 10–24)
BUN: 31 mg/dL — ABNORMAL HIGH (ref 8–27)
CO2: 24 mmol/L (ref 20–29)
Calcium: 9.7 mg/dL (ref 8.6–10.2)
Chloride: 105 mmol/L (ref 96–106)
Creatinine, Ser: 1.52 mg/dL — ABNORMAL HIGH (ref 0.76–1.27)
GFR calc Af Amer: 47 mL/min/{1.73_m2} — ABNORMAL LOW (ref >59)
GFR calc non Af Amer: 41 mL/min/{1.73_m2} — ABNORMAL LOW (ref >59)
Glucose: 96 mg/dL (ref 65–99)
Potassium: 4.7 mmol/L (ref 3.5–5.2)
Sodium: 141 mmol/L (ref 134–144)

## 2017-04-27 LAB — CBC
Hematocrit: 37.6 % (ref 37.5–51.0)
Hemoglobin: 12.4 g/dL — ABNORMAL LOW (ref 13.0–17.7)
MCH: 30.8 pg (ref 26.6–33.0)
MCHC: 33 g/dL (ref 31.5–35.7)
MCV: 93 fL (ref 79–97)
Platelets: 160 10*3/uL (ref 150–379)
RBC: 4.03 x10E6/uL — ABNORMAL LOW (ref 4.14–5.80)
RDW: 13.2 % (ref 12.3–15.4)
WBC: 6.8 10*3/uL (ref 3.4–10.8)

## 2017-04-27 LAB — TSH: TSH: 1.04 u[IU]/mL (ref 0.450–4.500)

## 2017-05-07 ENCOUNTER — Telehealth: Payer: Self-pay | Admitting: Cardiovascular Disease

## 2017-05-07 NOTE — Telephone Encounter (Signed)
Advised patient of lab results  

## 2017-05-07 NOTE — Telephone Encounter (Signed)
-----   Message from Erlene Quan, [place] sent at 05/03/2017  8:09 AM EST ----- Please let the pt know his labs suggest he is mildly dehydrated. He should push fluids, keep f/u with Dr Oval Linsey as scheduled.  [name] PA-C 05/03/2017 8:09 AM

## 2017-05-07 NOTE — Telephone Encounter (Signed)
New message    Patient is returning [place] call

## 2017-05-21 ENCOUNTER — Institutional Professional Consult (permissible substitution): Payer: Medicare Other | Admitting: Internal Medicine

## 2017-05-23 ENCOUNTER — Other Ambulatory Visit: Payer: Self-pay | Admitting: Cardiology

## 2017-05-23 DIAGNOSIS — I739 Peripheral vascular disease, unspecified: Secondary | ICD-10-CM

## 2017-05-30 ENCOUNTER — Ambulatory Visit (HOSPITAL_COMMUNITY)
Admission: RE | Admit: 2017-05-30 | Discharge: 2017-05-30 | Disposition: A | Discharge status: Home / Self Care | Payer: Medicare Other | Source: Ambulatory Visit | Attending: Cardiology | Admitting: Cardiology

## 2017-05-30 ENCOUNTER — Other Ambulatory Visit: Payer: Self-pay | Admitting: Cardiology

## 2017-05-30 DIAGNOSIS — I739 Peripheral vascular disease, unspecified: Secondary | ICD-10-CM

## 2017-05-30 DIAGNOSIS — R9389 Abnormal findings on diagnostic imaging of other specified body structures: Secondary | ICD-10-CM | POA: Diagnosis not present

## 2017-05-30 DIAGNOSIS — I779 Disorder of arteries and arterioles, unspecified: Secondary | ICD-10-CM | POA: Insufficient documentation

## 2017-05-30 DIAGNOSIS — I70202 Unspecified atherosclerosis of native arteries of extremities, left leg: Secondary | ICD-10-CM | POA: Insufficient documentation

## 2017-05-30 DIAGNOSIS — I708 Atherosclerosis of other arteries: Secondary | ICD-10-CM | POA: Insufficient documentation

## 2017-06-06 ENCOUNTER — Encounter: Payer: Self-pay | Admitting: Cardiovascular Disease

## 2017-06-06 ENCOUNTER — Ambulatory Visit: Payer: Medicare Other | Admitting: Cardiovascular Disease

## 2017-06-06 VITALS — BP 122/58 | HR 66 | Ht 67.0 in | Wt 198.2 lb

## 2017-06-06 DIAGNOSIS — I119 Hypertensive heart disease without heart failure: Secondary | ICD-10-CM

## 2017-06-06 DIAGNOSIS — I739 Peripheral vascular disease, unspecified: Secondary | ICD-10-CM | POA: Diagnosis not present

## 2017-06-06 DIAGNOSIS — E7800 Pure hypercholesterolemia, unspecified: Secondary | ICD-10-CM

## 2017-06-06 DIAGNOSIS — Z953 Presence of xenogenic heart valve: Secondary | ICD-10-CM

## 2017-06-06 HISTORY — DX: Peripheral vascular disease, unspecified: I73.9

## 2017-06-06 NOTE — Progress Notes (Signed)
Cardiology Office Note   Date:  06/06/2017   ID:  Randy Chen, DOB [birth year], MRN [id number]  PCP:  [name], MD  Cardiologist:   Skeet Latch, MD   No chief complaint on file.     History of Present Illness: Randy Chen is a 83 y.o. male with hypertension, hyperlipidemia, CAD s/p CABG (1987), aortic stenosis s/p bioprosthetic AVR (2006) and prostate cancer who presents for follow up.  Mr. Randy Chen was previously a patient of Dr. Mare Ferrari.  He had an echo 11/2012 that revealed LVEF 55-60% with a well-functioning AVR.  He had an episode of chest pain 05/2014 and underwent nuclear stress testing 05/2015 that was negative for ischemia.  He followed up with Randy Chen 04/2017 due to fatigue.  He also noted calf tightness with walking.  He was noted to have femoral bruits and diminished lower extremity pulses on exam.  He was bradycardic to 56.  Metoprolol was reduced.  He had ABIs 05/30/17 showed greater than 50% external iliac stenosis on the right and 30-49% popliteal stenosis on the left.  Mr. Sinn main complaint is fatigue.  He didn't notice any improvement in his symptoms after reducing metoprolol.  He has to wake up 2-3 times each night to use the restroom.  He sometimes has difficulty falling back asleep.  His wife notes that he falls asleep frequently throughout the day.  He does not snore loudly and she has not noted apneic episodes.  He also complains of cramping in bilateral calves when walking.   His calves feel heavy after walking long distances.  It improves with rest.He is unsure which leg is worse.  He denies lower extremity edema, orthopnea, or PND.  He does water aerobics 3 times weekly and has no exertional symptoms.  This is been ongoing for the last several months.  His wife has noticed that he seems to cough more at night.  It is non-productive and he denies fever or chills.  He attributes this to changes in the weather and allergies.     Past  Medical History:  Diagnosis Date  . Aortic stenosis   . Coronary artery disease    CABG x5 - 1987  . History of atherosclerotic cardiovascular disease   . History of prostatectomy   . Hyperlipidemia   . Hypertension   . PAD (peripheral artery disease) ([place]) 06/06/2017    Past Surgical History:  Procedure Laterality Date  . CARDIAC CATHETERIZATION     EF of 66%  . CARDIAC VALVE REPLACEMENT  2006  . CORONARY ARTERY BYPASS GRAFT  1987   x5  . I&D EXTREMITY Left 01/10/2015   Procedure: DEBRIDEMENT ANKLE PLACEMENT ANTIBIOTIC BEADS AND WOUND VAC;  Surgeon: [name], MD;  Location: [facility];  Service: Orthopedics;  Laterality: Left;     Current Outpatient Medications  Medication Sig Dispense Refill  . acetaminophen (TYLENOL) 500 MG tablet Take 1,000 mg by mouth 2 (two) times daily as needed for mild pain.    [name] amLODipine (NORVASC) 10 MG tablet Take 0.5 tablets (5 mg total) by mouth daily. 90 tablet 1  . aspirin EC 81 MG tablet Take 81 mg by mouth every evening.    [name] atorvastatin (LIPITOR) 80 MG tablet Take 1 tablet (80 mg total) by mouth daily at 6 PM. Due for follow up appt 90 tablet 0  . Calcium Carb-Cholecalciferol (CALCIUM 1000 + D PO) Take 1,000 mg by mouth daily.    . Cholecalciferol (VITAMIN  D) 2000 UNITS CAPS Take 2,000 Units by mouth daily.    . clopidogrel (PLAVIX) 75 MG tablet Take 1 tablet (75 mg total) by mouth daily. 90 tablet 3  . folic acid (FOLVITE) 350 MCG tablet Take 400 mcg by mouth daily.     . Glucosamine-Chondroit-Vit C-Mn (GLUCOSAMINE 1500 COMPLEX PO) Take 1,500 mg by mouth daily.    [name] lisinopril (PRINIVIL,ZESTRIL) 40 MG tablet Take 40 mg by mouth daily.     . metoprolol succinate (TOPROL-XL) 25 MG 24 hr tablet Take 1 tablet (25 mg total) daily by mouth. 90 tablet 3  . Multiple Vitamin (MULTIVITAMIN WITH MINERALS) TABS Take 1 tablet by mouth daily. Theragran    . vitamin C (ASCORBIC ACID) 500 MG tablet Take 500 mg by mouth daily.    . nitroGLYCERIN (NITROSTAT)  0.4 MG SL tablet Place 1 tablet (0.4 mg total) under the tongue every 5 (five) minutes as needed for chest pain. 25 tablet 3   No current facility-administered medications for this visit.     Allergies:   Patient has no known allergies.    Social History:  The patient  reports that he quit smoking about 61 years ago. he has never used smokeless tobacco. He reports that he does not drink alcohol or use drugs.   Family History:  The patient's family history includes Congestive Heart Failure in his sister and sister; Heart attack in his brother; Heart disease in his father; Heart failure in his mother.    ROS:  Please see the history of present illness.   Otherwise, review of systems are positive for none.   All other systems are reviewed and negative.    PHYSICAL EXAM: VS:  BP (!) 122/58   Pulse 66   Ht 5\' 7"  (1.702 m)   Wt 198 lb 3.2 oz (89.9 kg)   BMI 31.04 kg/m  , BMI Body mass index is 31.04 kg/m. GENERAL:  Well appearing HEENT: Pupils equal round and reactive, fundi not visualized, oral mucosa unremarkable NECK:  No jugular venous distention, waveform within normal limits, carotid upstroke brisk and symmetric, no bruits, no thyromegaly LYMPHATICS:  No cervical adenopathy LUNGS:  Clear to auscultation bilaterally HEART:  RRR.  PMI not displaced or sustained,S1 and S2 within normal limits, no S3, no S4, no clicks, no rubs, no murmurs ABD:  Flat, positive bowel sounds normal in frequency in pitch, no bruits, no rebound, no guarding, no midline pulsatile mass, no hepatomegaly, no splenomegaly EXT:  1 plus pulses R DP/PT.  Unable to palpate L DP/PT. 2+ R radials.  1+ bilateral ankle edema, no cyanosis no clubbing SKIN:  No rashes no nodules NEURO:  Cranial nerves II through XII grossly intact, motor grossly intact throughout PSYCH:  Cognitively intact, oriented to person place and time   EKG:  EKG is not ordered today. The ekg ordered today demonstrates sinus bradycardia rate 57  bpm.  ABI 05/31/17: R 0.92, L 0.98 Right: DFA flow possibly retrograde with visualized collaterals.  External iliac velocity 3.05 cm/s (>50%)  Collaterals noted in the pelvis.  Stenosis in the proximal CFA. L: Proximal popliteal 30-49%,    Recent Labs: 04/26/2017: BUN 31; Creatinine, Ser 1.52; Hemoglobin 12.4; Platelets 160; Potassium 4.7; Sodium 141; TSH 1.040    Lipid Panel    Component Value Date/Time   CHOL 146 01/31/2012 1007   TRIG 125.0 01/31/2012 1007   HDL 54.50 01/31/2012 1007   CHOLHDL 3 01/31/2012 1007   VLDL 25.0 01/31/2012 1007  [place] 67 01/31/2012 1007      Wt Readings from Last 3 Encounters:  06/06/17 198 lb 3.2 oz (89.9 kg)  04/26/17 196 lb 6.4 oz (89.1 kg)  06/10/16 192 lb (87.1 kg)      ASSESSMENT AND PLAN:  # CAD s/p CABG: Stable.  Mr. Dutter has no chest pain.  Continue aspirin and clopidogrel indefinitely.  These can be held as necessary for procedures.  Continue metoprolol and atorvastatin.   # PAD: Mr. Likes has mild R sided disease and moderate to severe on L.  We will refer him to Dr. Gwenlyn Found for evaluation.  I suspect that something else, possibly sciatica is causing his R sided symptoms.  Continue aspirin, clopidogrel, and atorvastatin. # Hypertensive heart disease:  Blood pressure was initially elevated but improved on repeat.  Continue amlodipine, lisinopril, and metoprolol.  # Hyperlipidemia:  Continue atorvastatin. We will get a copy of his lipids from Dr. Rex Kras.  LDL should be less than 70.  # Aortic stenosis s/p bioprosthetic AVR: Valve is stable.  Last echo 2014.  He has no heart failure symptoms other than mild ankle edema.  Repeat echo.    Current medicines are reviewed at length with the patient today.  The patient does not have concerns regarding medicines.  The following changes have been made:  no change  Labs/ tests ordered today include:   Orders Placed This Encounter  Procedures  . ECHOCARDIOGRAM COMPLETE      Disposition:   FU with Milee Qualls C. Oval Linsey, MD, [facility] in 6 months.   This note was written with the assistance of speech recognition software.  Please excuse any transcriptional errors.  Signed, Caelin Rosen C. Oval Linsey, MD, [facility]  06/06/2017 2:02 PM    [facility]

## 2017-06-06 NOTE — Patient Instructions (Addendum)
Medication Instructions:  Your physician recommends that you continue on your current medications as directed. Please refer to the Current Medication list given to you today.  Labwork: NONE  Testing/Procedures: Your physician has requested that you have an echocardiogram. Echocardiography is a painless test that uses sound waves to create images of your heart. It provides your doctor with information about the size and shape of your heart and how well your heart's chambers and valves are working. This procedure takes approximately one hour. There are no restrictions for this procedure. [address]   Follow-Up: Your physician wants you to follow-up in: [place] will receive a reminder letter in the mail two months in advance. If you don't receive a letter, please call our office to schedule the follow-up appointment.  Your physician recommends that you schedule a follow-up appointment in:  DR BERRY FOR PERIPHERAL ARTERIAL DISEASE SOON

## 2017-06-08 ENCOUNTER — Ambulatory Visit: Payer: Medicare Other | Admitting: Cardiovascular Disease

## 2017-06-08 ENCOUNTER — Encounter: Payer: Self-pay | Admitting: Cardiovascular Disease

## 2017-06-08 DIAGNOSIS — I739 Peripheral vascular disease, unspecified: Secondary | ICD-10-CM

## 2017-06-08 NOTE — Progress Notes (Signed)
06/08/2017 [name]   1927-09-09  [id number]  Primary Physician [name], MD Primary Cardiologist: Lorretta Harp MD Lupe Carney, [place]  HPI:  Randy Chen is a 83 y.o. 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 He was referred to me by Dr. Oval Linsey for peripheral vascular evaluation because of bilateral calf claudication. He does have a history of treated hypertension and hyperlipidemia. Never had a heart attack or stroke. He had bypass surgery in 1987 in [place] and bioprosthetic aortic valve replacement here in [place] in 2006. He was complaining of mild calf claudication and had lower extremity arterial Doppler studies performed 05/31/17 revealing right external iliac and common femoral stenosis. There is right ABI was 0.92 as was his left. He exercises several times a week in the pool doing water aerobics. He says that his claudication really does not symmetrically impaired his lifestyle on a daily basis.    Current Meds  Medication Sig  . acetaminophen (TYLENOL) 500 MG tablet Take 1,000 mg by mouth 2 (two) times daily as needed for mild pain.  [name] amLODipine (NORVASC) 10 MG tablet Take 0.5 tablets (5 mg total) by mouth daily.  [name] aspirin EC 81 MG tablet Take 81 mg by mouth every evening.  [name] atorvastatin (LIPITOR) 80 MG tablet Take 1 tablet (80 mg total) by mouth daily at 6 PM. Due for follow up appt  . Calcium Carb-Cholecalciferol (CALCIUM 1000 + D PO) Take 1,000 mg by mouth daily.  . Cholecalciferol (VITAMIN D) 2000 UNITS CAPS Take 2,000 Units by mouth daily.  . clopidogrel (PLAVIX) 75 MG tablet Take 1 tablet (75 mg total) by mouth daily.  . folic acid (FOLVITE) 716 MCG tablet Take 400 mcg by mouth daily.   . Glucosamine-Chondroit-Vit C-Mn (GLUCOSAMINE 1500 COMPLEX PO) Take 1,500 mg by mouth daily.  [name] lisinopril (PRINIVIL,ZESTRIL) 40 MG tablet Take 40 mg by mouth daily.   .  metoprolol succinate (TOPROL-XL) 25 MG 24 hr tablet Take 1 tablet (25 mg total) daily by mouth.  . Multiple Vitamin (MULTIVITAMIN WITH MINERALS) TABS Take 1 tablet by mouth daily. Theragran  . vitamin C (ASCORBIC ACID) 500 MG tablet Take 500 mg by mouth daily.     No Known Allergies  Social History   Socioeconomic History  . Marital status: Married    Spouse name: Not on file  . Number of children: Not on file  . Years of education: Not on file  . Highest education level: Not on file  Social Needs  . Financial resource strain: Not on file  . Food insecurity - worry: Not on file  . Food insecurity - inability: Not on file  . Transportation needs - medical: Not on file  . Transportation needs - non-medical: Not on file  Occupational History  . Not on file  Tobacco Use  . Smoking status: Former Smoker    Last attempt to quit: 11/18/1955    Years since quitting: 61.5  . Smokeless tobacco: Never Used  Substance and Sexual Activity  . Alcohol use: No  . Drug use: No  . Sexual activity: Not on file  Other Topics Concern  . Not on file  Social History Narrative  . Not on file     Review of Systems: General: negative for chills, fever, night sweats or weight changes.  Cardiovascular: negative for chest pain, dyspnea on exertion, edema, orthopnea, palpitations, paroxysmal nocturnal dyspnea or  shortness of breath Dermatological: negative for rash Respiratory: negative for cough or wheezing Urologic: negative for hematuria Abdominal: negative for nausea, vomiting, diarrhea, bright red blood per rectum, melena, or hematemesis Neurologic: negative for visual changes, syncope, or dizziness All other systems reviewed and are otherwise negative except as noted above.    Blood pressure 132/60, pulse 72, height 5\' 7"  (1.702 m), weight 197 lb (89.4 kg).  General appearance: alert and no distress Neck: no adenopathy, no carotid bruit, no JVD, supple, symmetrical, trachea midline and  thyroid not enlarged, symmetric, no tenderness/mass/nodules Lungs: clear to auscultation bilaterally Heart: regular rate and rhythm, S1, S2 normal, no murmur, click, rub or gallop Extremities: extremities normal, atraumatic, no cyanosis or edema Pulses: 2+ and symmetric Diminished pedal pulses bilaterally Skin: Skin color, texture, turgor normal. No rashes or lesions Neurologic: Alert and oriented X 3, normal strength and tone. Normal symmetric reflexes. Normal coordination and gait  EKG not performed today  ASSESSMENT AND PLAN:   Claudication of both lower extremities [facility]) Mr. Gerstenberger was referred to me by Dr. Oval Linsey for evaluation and potential treatment for claudication. He is a 83 year old delightful Caucasian male with history of remote bypass surgery 30 years ago and bioprosthetic aortic valve replacement in 2006. He does do water aerobics several times a week. He lives independently with his wife in a retirement community. He has mild calf claudication right greater than left which is not significantly lifestyle limiting. He did have recent Doppler studies performed 05/31/17 had suggested disease in his right external iliac artery and common femoral artery. After long discussion with decided to treat him conservatively given his age and relative paucity of symptoms. I will see him back on an as-needed basis.      Lorretta Harp MD FACP,FACC,FAHA, [facility] 06/08/2017 10:48 AM

## 2017-06-08 NOTE — Patient Instructions (Signed)
Medication Instructions: Your physician recommends that you continue on your current medications as directed. Please refer to the Current Medication list given to you today.   Follow-Up: Your physician recommends that you schedule a follow-up appointment as needed with Dr. Berry.    

## 2017-06-08 NOTE — Assessment & Plan Note (Signed)
Randy Chen was referred to me by Dr. Oval Linsey for evaluation and potential treatment for claudication. He is a 83 year old delightful Caucasian male with history of remote bypass surgery 30 years ago and bioprosthetic aortic valve replacement in 2006. He does do water aerobics several times a week. He lives independently with his wife in a retirement community. He has mild calf claudication right greater than left which is not significantly lifestyle limiting. He did have recent Doppler studies performed 05/31/17 had suggested disease in his right external iliac artery and common femoral artery. After long discussion with decided to treat him conservatively given his age and relative paucity of symptoms. I will see him back on an as-needed basis.

## 2017-06-26 ENCOUNTER — Other Ambulatory Visit: Payer: Self-pay

## 2017-06-26 ENCOUNTER — Ambulatory Visit (HOSPITAL_COMMUNITY): Payer: Medicare Other | Attending: Cardiology

## 2017-06-26 ENCOUNTER — Other Ambulatory Visit: Payer: Self-pay | Admitting: *Deleted

## 2017-06-26 DIAGNOSIS — I059 Rheumatic mitral valve disease, unspecified: Secondary | ICD-10-CM | POA: Insufficient documentation

## 2017-06-26 DIAGNOSIS — I359 Nonrheumatic aortic valve disorder, unspecified: Secondary | ICD-10-CM | POA: Diagnosis present

## 2017-06-26 DIAGNOSIS — I119 Hypertensive heart disease without heart failure: Secondary | ICD-10-CM | POA: Diagnosis not present

## 2017-06-26 DIAGNOSIS — Z951 Presence of aortocoronary bypass graft: Secondary | ICD-10-CM | POA: Diagnosis not present

## 2017-06-26 DIAGNOSIS — Z953 Presence of xenogenic heart valve: Secondary | ICD-10-CM | POA: Insufficient documentation

## 2017-06-26 DIAGNOSIS — Z87891 Personal history of nicotine dependence: Secondary | ICD-10-CM | POA: Insufficient documentation

## 2017-06-26 DIAGNOSIS — E785 Hyperlipidemia, unspecified: Secondary | ICD-10-CM | POA: Diagnosis not present

## 2017-06-26 MED ORDER — PERFLUTREN LIPID MICROSPHERE
1.0000 mL | INTRAVENOUS | Status: AC | PRN
  Administered 2017-06-26: 2 mL via INTRAVENOUS

## 2017-06-26 MED ORDER — AMLODIPINE BESYLATE 5 MG PO TABS: 5.0000 mg | ORAL_TABLET | Freq: Every day | ORAL | 3 refills | Status: DC

## 2017-06-26 NOTE — Telephone Encounter (Signed)
Spoke with patient and he is currently taking Amlodipine 10 mg 1/2 tablet daily  Spoke with patient and he is ok with the 5 mg tablets 1 daily being sent to pharmacy  Rx sent via Epic

## 2017-07-03 ENCOUNTER — Ambulatory Visit: Payer: Medicare Other | Admitting: Cardiovascular Disease

## 2017-07-16 DIAGNOSIS — Z961 Presence of intraocular lens: Secondary | ICD-10-CM | POA: Diagnosis not present

## 2017-07-16 DIAGNOSIS — H35371 Puckering of macula, right eye: Secondary | ICD-10-CM | POA: Diagnosis not present

## 2017-07-16 DIAGNOSIS — H40001 Preglaucoma, unspecified, right eye: Secondary | ICD-10-CM | POA: Diagnosis not present

## 2017-08-08 ENCOUNTER — Telehealth: Payer: Self-pay | Admitting: Cardiovascular Disease

## 2017-08-08 MED ORDER — CLOPIDOGREL BISULFATE 75 MG PO TABS: 75.0000 mg | ORAL_TABLET | Freq: Every day | ORAL | 5 refills | Status: DC

## 2017-08-08 NOTE — Telephone Encounter (Signed)
New message    *STAT* If patient is at the pharmacy, call can be transferred to refill team.   1. Which medications need to be refilled? (please list name of each medication and dose if known) clopidogrel (PLAVIX) 75 MG tablet  2. Which pharmacy/location (including street and city if local pharmacy) is medication to be sent to?  [place], [address] AT [address] [phone] (Phone) [phone] (Fax)   3. Do they need a 30 day or 90 day supply?

## 2017-08-09 ENCOUNTER — Other Ambulatory Visit: Payer: Self-pay | Admitting: *Deleted

## 2017-08-09 MED ORDER — CLOPIDOGREL BISULFATE 75 MG PO TABS: 75.0000 mg | ORAL_TABLET | Freq: Every day | ORAL | 5 refills | Status: DC

## 2017-08-13 DIAGNOSIS — H40021 Open angle with borderline findings, high risk, right eye: Secondary | ICD-10-CM | POA: Diagnosis not present

## 2017-08-27 ENCOUNTER — Other Ambulatory Visit: Payer: Self-pay

## 2017-08-27 MED ORDER — ATORVASTATIN CALCIUM 80 MG PO TABS: 80.0000 mg | ORAL_TABLET | Freq: Every day | ORAL | 2 refills | Status: DC

## 2017-10-03 DIAGNOSIS — H401111 Primary open-angle glaucoma, right eye, mild stage: Secondary | ICD-10-CM | POA: Diagnosis not present

## 2017-11-07 DIAGNOSIS — H401111 Primary open-angle glaucoma, right eye, mild stage: Secondary | ICD-10-CM | POA: Diagnosis not present

## 2017-11-15 DIAGNOSIS — I10 Essential (primary) hypertension: Secondary | ICD-10-CM | POA: Diagnosis not present

## 2017-11-15 DIAGNOSIS — E782 Mixed hyperlipidemia: Secondary | ICD-10-CM | POA: Diagnosis not present

## 2017-11-23 DIAGNOSIS — E785 Hyperlipidemia, unspecified: Secondary | ICD-10-CM | POA: Diagnosis not present

## 2017-11-23 DIAGNOSIS — R7301 Impaired fasting glucose: Secondary | ICD-10-CM | POA: Diagnosis not present

## 2017-11-23 DIAGNOSIS — Z0000 Encounter for general adult medical examination without abnormal findings: Secondary | ICD-10-CM | POA: Diagnosis not present

## 2017-11-23 DIAGNOSIS — I10 Essential (primary) hypertension: Secondary | ICD-10-CM | POA: Diagnosis not present

## 2017-12-05 ENCOUNTER — Encounter: Payer: Self-pay | Admitting: Cardiovascular Disease

## 2017-12-05 ENCOUNTER — Ambulatory Visit: Payer: Medicare Other | Admitting: Cardiovascular Disease

## 2017-12-05 ENCOUNTER — Telehealth: Payer: Self-pay | Admitting: *Deleted

## 2017-12-05 VITALS — BP 116/74 | HR 55 | Ht 67.0 in | Wt 197.4 lb

## 2017-12-05 DIAGNOSIS — I2510 Atherosclerotic heart disease of native coronary artery without angina pectoris: Secondary | ICD-10-CM

## 2017-12-05 DIAGNOSIS — I119 Hypertensive heart disease without heart failure: Secondary | ICD-10-CM | POA: Diagnosis not present

## 2017-12-05 DIAGNOSIS — E7800 Pure hypercholesterolemia, unspecified: Secondary | ICD-10-CM

## 2017-12-05 DIAGNOSIS — Z953 Presence of xenogenic heart valve: Secondary | ICD-10-CM | POA: Diagnosis not present

## 2017-12-05 DIAGNOSIS — I739 Peripheral vascular disease, unspecified: Secondary | ICD-10-CM

## 2017-12-05 NOTE — Telephone Encounter (Signed)
Patient was seen in the office today by Dr Oval Linsey. He would like to change physicians to one that sees patients at [facility]. Dr Oval Linsey ok with the change. Will forward to Dr Stanford Breed for review

## 2017-12-05 NOTE — Patient Instructions (Addendum)
Medication Instructions:  STOP PLAVIX (CLOPIDOGREL)    CONTINUE ASPIRIN 81 MG DAILY   Labwork: none  Testing/Procedures: none  Follow-Up: Your physician wants you to follow-up in: 6 month (will try to arrange at [place] with Dr Stanford Breed) Dennis Bast will receive a reminder letter in the mail two months in advance. If you don't receive a letter, please call our office to schedule the follow-up appointment.  If you need a refill on your cardiac medications before your next appointment, please call your pharmacy.

## 2017-12-05 NOTE — Progress Notes (Signed)
Cardiology Office Note   Date:  12/05/2017   ID:  Randy Chen, DOB 1927/12/27, MRN [id number]  PCP:  [name], MD  Cardiologist:   Skeet Latch, MD   No chief complaint on file.     History of Present Illness: Randy Chen is a 84 y.o. male with hypertension, hyperlipidemia, CAD s/p CABG (1987), aortic stenosis s/p bioprosthetic AVR (2006) and prostate cancer who presents for follow up.  Randy Chen was previously a patient of Dr. Mare Chen.  He had an echo 11/2012 that revealed LVEF 55-60% with a well-functioning AVR.  He had an episode of chest pain 05/2014 and underwent nuclear stress testing 05/2015 that was negative for ischemia.  He followed up with Randy Chen 04/2017 due to fatigue.  He also noted calf tightness with walking.  He was noted to have femoral bruits and diminished lower extremity pulses on exam.  He was bradycardic to 56.  Metoprolol was reduced.  He had ABIs 05/30/17 showed greater than 50% external iliac stenosis on the right and 30-49% popliteal stenosis on the left.  At his last appointment Randy Chen reported frequent nocturia that affected his sleep.  He also had mild edema.  Repeat echo 06/2017 revealed LVEF 60 to 65%.  His mean bioprosthetic aortic valve gradient was 13 mmHg.  He also noted claudication with walking. He saw Dr. Gwenlyn Chen and he elected to manage him medically given his age and the fact that he had limited symptoms.  Since his last appointment Randy Chen has been feeling well.  He has no chest pain or shortness of breath.  Metoprolol was discontinued due to bradycardia.  His heart rates remain in the 50s but he has no lightheadedness or dizziness.  He exercises several times per week doing water aerobics.  He also does a weightlifting circuit at [facility].  He has no exertional symptoms.  He denies lower extremity edema, orthopnea, or PND.  He has not noted any claudication recently.  He does occasionally get pain  in his right knee.   Past Medical History:  Diagnosis Date  . Aortic stenosis   . Coronary artery disease    CABG x5 - 1987  . History of atherosclerotic cardiovascular disease   . History of prostatectomy   . Hyperlipidemia   . Hypertension   . PAD (peripheral artery disease) ([place]) 06/06/2017    Past Surgical History:  Procedure Laterality Date  . CARDIAC CATHETERIZATION     EF of 66%  . CARDIAC VALVE REPLACEMENT  2006  . CORONARY ARTERY BYPASS GRAFT  1987   x5  . I&D EXTREMITY Left 01/10/2015   Procedure: DEBRIDEMENT ANKLE PLACEMENT ANTIBIOTIC BEADS AND WOUND VAC;  Surgeon: Randy Minion, MD;  Location: [place];  Service: Orthopedics;  Laterality: Left;     Current Outpatient Medications  Medication Sig Dispense Refill  . acetaminophen (TYLENOL) 500 MG tablet Take 1,000 mg by mouth 2 (two) times daily as needed for mild pain.    [name] amLODipine (NORVASC) 5 MG tablet Take 1 tablet (5 mg total) by mouth daily. 90 tablet 3  . aspirin EC 81 MG tablet Take 81 mg by mouth daily.    [name] atorvastatin (LIPITOR) 40 MG tablet Take 40 mg by mouth daily.    . Calcium Carb-Cholecalciferol (CALCIUM 1000 + D PO) Take 1,000 mg by mouth daily.    . Cholecalciferol (VITAMIN D) 2000 UNITS CAPS Take 2,000 Units by mouth daily.    [name]  folic acid (FOLVITE) 502 MCG tablet Take 400 mcg by mouth daily.     . Glucosamine-Chondroit-Vit C-Mn (GLUCOSAMINE 1500 COMPLEX PO) Take 1,500 mg by mouth daily.    [name] lisinopril (PRINIVIL,ZESTRIL) 40 MG tablet Take 40 mg by mouth daily.     . Multiple Vitamin (MULTIVITAMIN WITH MINERALS) TABS Take 1 tablet by mouth daily. Theragran    . timolol (TIMOPTIC) 0.5 % ophthalmic solution INT 1 GTT IN OD BID  3  . vitamin C (ASCORBIC ACID) 500 MG tablet Take 500 mg by mouth daily.    . nitroGLYCERIN (NITROSTAT) 0.4 MG SL tablet Place 1 tablet (0.4 mg total) under the tongue every 5 (five) minutes as needed for chest pain. 25 tablet 3   No current facility-administered medications  for this visit.     Allergies:   Patient has no known allergies.    Social History:  The patient  reports that he quit smoking about 62 years ago. He has never used smokeless tobacco. He reports that he does not drink alcohol or use drugs.   Family History:  The patient's family history includes Congestive Heart Failure in his sister and sister; Heart attack in his brother; Heart disease in his father; Heart failure in his mother.    ROS:  Please see the history of present illness.   Otherwise, review of systems are positive for none.   All other systems are reviewed and negative.    PHYSICAL EXAM: VS:  BP 116/74   Pulse (!) 55   Ht 5\' 7"  (1.702 m)   Wt 197 lb 6.4 oz (89.5 kg)   BMI 30.92 kg/m  , BMI Body mass index is 30.92 kg/m. GENERAL:  Well appearing HEENT: Pupils equal round and reactive, fundi not visualized, oral mucosa unremarkable NECK:  No jugular venous distention, waveform within normal limits, carotid upstroke brisk and symmetric, no bruits LUNGS:  Clear to auscultation bilaterally HEART:  Regular rhythm.  Bradycardic.  PMI not displaced or sustained,S1 and S2 within normal limits, no S3, no S4, no clicks, no rubs, no murmurs ABD:  Flat, positive bowel sounds normal in frequency in pitch, no bruits, no rebound, no guarding, no midline pulsatile mass, no hepatomegaly, no splenomegaly EXT:  1 plus pulses R DP/PT.  Unable to palpate L DP/PT. 2+ R radials.  1+ bilateral ankle edema, no cyanosis no clubbing SKIN:  No rashes no nodules NEURO:  Cranial nerves II through XII grossly intact, motor grossly intact throughout PSYCH:  Cognitively intact, oriented to person place and time   EKG:  EKG is ordered today. The ekg ordered today demonstrates sinus bradycardia rate 57 bpm. 12/05/17: Sinus bradycardia.  Rate 55 bpm.    ABI 05/31/17: R 0.92, L 0.98 Right: DFA flow possibly retrograde with visualized collaterals.  External iliac velocity 3.05 cm/s (>50%)  Collaterals  noted in the pelvis.  Stenosis in the proximal CFA. L: Proximal popliteal 30-49%,   Echo 06/26/17: Study Conclusions  - Left ventricle: The cavity size was normal. There was moderate   focal basal hypertrophy. Systolic function was normal. The   estimated ejection fraction was in the range of 60% to 65%. Wall   motion was normal; there were no regional wall motion   abnormalities. - Aortic valve: A bioprosthesis was present and functioning   normally. Mean gradient (S): 13 mm Hg. Valve area (Vmax): 1.18   cm^2. - Mitral valve: Calcified annulus. - Left atrium: The atrium was mildly dilated. - Tricuspid valve: There  was trivial regurgitation. - Pulmonary arteries: PA peak pressure: 33 mm Hg (S).   Recent Labs: 04/26/2017: BUN 31; Creatinine, Ser 1.52; Hemoglobin 12.4; Platelets 160; Potassium 4.7; Sodium 141; TSH 1.040   11/15/2017: Total cholesterol 146, triglycerides 142, HDL 44, LDL 73 TSH 1.08 Creatinine 1.67  Lipid Panel    Component Value Date/Time   CHOL 146 01/31/2012 1007   TRIG 125.0 01/31/2012 1007   HDL 54.50 01/31/2012 1007   CHOLHDL 3 01/31/2012 1007   VLDL 25.0 01/31/2012 1007   LDLCALC 67 01/31/2012 1007      Wt Readings from Last 3 Encounters:  12/05/17 197 lb 6.4 oz (89.5 kg)  06/08/17 197 lb (89.4 kg)  06/06/17 198 lb 3.2 oz (89.9 kg)      ASSESSMENT AND PLAN:   # CAD s/p CABG:  # Hyperlipidemia: Stable.  Randy Chen has no chest pain.  Continue aspirin and stop clopidogrel.  Continue atorvastatin.  Metoprolol stopped 2/2 bradycardia.    # PAD: Randy Chen has mild R sided disease and moderate to severe on L. He no longer has claudication. He has been seen by Dr. Gwenlyn Chen and will be managed medically.  Continue aspirin and statin.    # Hypertensive heart disease:  Blood pressure well-controlled.  Continue amlodipine and isinopril, and metoprolol.  # Aortic stenosis s/p bioprosthetic AVR: Valve is stable.  Mean gradient 13 mmHg 06/2017.  Mr.  Chen lives in [place] and would like to follow up with Dr. Stanford Breed at the [facility] office.    Current medicines are reviewed at length with the patient today.  The patient does not have concerns regarding medicines.  The following changes have been made:  no change  Labs/ tests ordered today include:   No orders of the defined types were placed in this encounter.    Disposition:   FU with Fines Kimberlin C. Oval Linsey, MD, [facility] in 6 months.     Signed, Mikia Delaluz C. Oval Linsey, MD, [facility]  12/05/2017 10:32 AM    [place]

## 2017-12-05 NOTE — Telephone Encounter (Signed)
Ok with me Randy Chen  

## 2018-02-11 DIAGNOSIS — H401111 Primary open-angle glaucoma, right eye, mild stage: Secondary | ICD-10-CM | POA: Diagnosis not present

## 2018-02-21 ENCOUNTER — Other Ambulatory Visit: Payer: Self-pay | Admitting: Cardiovascular Disease

## 2018-05-22 NOTE — Progress Notes (Signed)
HPI: Follow-up coronary artery disease and prior aortic valve replacement.  Patient is status post coronary artery bypass and graft in 1987 and had aortic valve replacement with a bioprosthetic valve in 2006.  Nuclear study December 2015 showed ejection fraction 70%.  Per Dr. Mare Ferrari there was no ischemia and he was treated medically.  Last echocardiogram January 2019 showed normal LV function, bioprosthetic aortic valve with mean gradient 13 mmHg and mild left atrial enlargement.  Patient has known peripheral vascular disease and was evaluated by Dr. Gwenlyn Found and medical therapy recommended.  Patient was followed by Dr. Oval Linsey but preferred to be seen in [facility] for convenience.  He therefore presents to establish with me.  Since he was last seen the patient denies any dyspnea on exertion, orthopnea, PND, pedal edema, palpitations, syncope or chest pain.  Current Outpatient Medications  Medication Sig Dispense Refill  . amLODipine (NORVASC) 5 MG tablet Take 1 tablet (5 mg total) by mouth daily. 90 tablet 3  . aspirin EC 81 MG tablet Take 81 mg by mouth daily.    [name] atorvastatin (LIPITOR) 40 MG tablet Take 40 mg by mouth daily.    . Calcium Carb-Cholecalciferol (CALCIUM 1000 + D PO) Take 1,000 mg by mouth daily.    . Cholecalciferol (VITAMIN D) 2000 UNITS CAPS Take 2,000 Units by mouth daily.    . folic acid (FOLVITE) 694 MCG tablet Take 400 mcg by mouth daily.     . Glucosamine-Chondroit-Vit C-Mn (GLUCOSAMINE 1500 COMPLEX PO) Take 1,500 mg by mouth daily.    [name] lisinopril (PRINIVIL,ZESTRIL) 40 MG tablet Take 40 mg by mouth daily.     . metoprolol succinate (TOPROL-XL) 50 MG 24 hr tablet TAKE 1 TABLET BY MOUTH DAILY. DUE FOR FOLLOW UP APPOINTMENT. KEEP OFFICE VISIT 90 tablet 3  . Multiple Vitamin (MULTIVITAMIN WITH MINERALS) TABS Take 1 tablet by mouth daily. Theragran    . timolol (TIMOPTIC) 0.5 % ophthalmic solution INT 1 GTT IN OD BID  3  . vitamin C (ASCORBIC ACID) 500 MG tablet Take  500 mg by mouth daily.    . nitroGLYCERIN (NITROSTAT) 0.4 MG SL tablet Place 1 tablet (0.4 mg total) under the tongue every 5 (five) minutes as needed for chest pain. 25 tablet 3   No current facility-administered medications for this visit.      Past Medical History:  Diagnosis Date  . Aortic stenosis   . Coronary artery disease    CABG x5 - 1987  . History of atherosclerotic cardiovascular disease   . History of prostatectomy   . Hyperlipidemia   . Hypertension   . PAD (peripheral artery disease) ([place]) 06/06/2017    Past Surgical History:  Procedure Laterality Date  . CARDIAC CATHETERIZATION     EF of 66%  . CARDIAC VALVE REPLACEMENT  2006  . CORONARY ARTERY BYPASS GRAFT  1987   x5  . I&D EXTREMITY Left 01/10/2015   Procedure: DEBRIDEMENT ANKLE PLACEMENT ANTIBIOTIC BEADS AND WOUND VAC;  Surgeon: [name], MD;  Location: [place];  Service: Orthopedics;  Laterality: Left;    Social History   Socioeconomic History  . Marital status: Married    Spouse name: Not on file  . Number of children: Not on file  . Years of education: Not on file  . Highest education level: Not on file  Occupational History  . Not on file  Social Needs  . Financial resource strain: Not on file  . Food insecurity:  Worry: Not on file    Inability: Not on file  . Transportation needs:    Medical: Not on file    Non-medical: Not on file  Tobacco Use  . Smoking status: Former Smoker    Last attempt to quit: 11/18/1955    Years since quitting: 62.5  . Smokeless tobacco: Never Used  Substance and Sexual Activity  . Alcohol use: No  . Drug use: No  . Sexual activity: Not on file  Lifestyle  . Physical activity:    Days per week: Not on file    Minutes per session: Not on file  . Stress: Not on file  Relationships  . Social connections:    Talks on phone: Not on file    Gets together: Not on file    Attends religious service: Not on file    Active member of club or organization: Not  on file    Attends meetings of clubs or organizations: Not on file    Relationship status: Not on file  . Intimate partner violence:    Fear of current or ex partner: Not on file    Emotionally abused: Not on file    Physically abused: Not on file    Forced sexual activity: Not on file  Other Topics Concern  . Not on file  Social History Narrative  . Not on file    Family History  Problem Relation Age of Onset  . Heart disease Father   . Heart failure Mother   . Heart attack Brother   . Congestive Heart Failure Sister   . Congestive Heart Failure Sister     ROS: no fevers or chills, productive cough, hemoptysis, dysphasia, odynophagia, melena, hematochezia, dysuria, hematuria, rash, seizure activity, orthopnea, PND, pedal edema, claudication. Remaining systems are negative.  Physical Exam: Well-developed well-nourished in no acute distress.  Skin is warm and dry.  HEENT is normal.  Neck is supple.  Chest is clear to auscultation with normal expansion.  Cardiovascular exam is regular rate and rhythm.  2/6 systolic murmur left sternal border.  No diastolic murmur. Abdominal exam nontender or distended. No masses palpated. Extremities show no edema. neuro grossly intact  A/P  1 coronary artery disease status post coronary artery bypass graft-patient denies chest pain.  Plan to continue medical therapy with aspirin and statin.  Note approximately 20 minutes spent reviewing patient's chart prior to arrival.  2 prior aortic valve replacement-normal gradients on last echocardiogram.  Plan to continue SBE prophylaxis.  3 peripheral vascular disease-continue aspirin and statin.  4 hyperlipidemia-continue statin.  Check lipids and liver.  5 hypertension-blood pressure is controlled.  Continue present medications and follow.  Check potassium and renal function.  Kirk Ruths, MD

## 2018-06-05 ENCOUNTER — Encounter: Payer: Self-pay | Admitting: Cardiology

## 2018-06-05 ENCOUNTER — Ambulatory Visit: Payer: Medicare Other | Admitting: Cardiology

## 2018-06-05 VITALS — BP 126/64 | HR 61 | Ht 67.0 in | Wt 198.4 lb

## 2018-06-05 DIAGNOSIS — I2510 Atherosclerotic heart disease of native coronary artery without angina pectoris: Secondary | ICD-10-CM | POA: Diagnosis not present

## 2018-06-05 DIAGNOSIS — I10 Essential (primary) hypertension: Secondary | ICD-10-CM

## 2018-06-05 DIAGNOSIS — Z953 Presence of xenogenic heart valve: Secondary | ICD-10-CM | POA: Diagnosis not present

## 2018-06-05 DIAGNOSIS — E7800 Pure hypercholesterolemia, unspecified: Secondary | ICD-10-CM | POA: Diagnosis not present

## 2018-06-05 NOTE — Patient Instructions (Signed)
Medication Instructions:  NO CHANGE If you need a refill on your cardiac medications before your next appointment, please call your pharmacy.   Lab work: Your physician recommends that you return for lab work PRIOR TO EATING If you have labs (blood work) drawn today and your tests are completely normal, you will receive your results only by: [name] MyChart Message (if you have MyChart) OR . A paper copy in the mail If you have any lab test that is abnormal or we need to change your treatment, we will call you to review the results.  Follow-Up:  Your physician recommends that you schedule a follow-up appointment in: [place] 2 MONTHS PRIOR TO THAT APPOINTMENT TIME TO SCHEDULE

## 2018-07-02 DIAGNOSIS — D2262 Melanocytic nevi of left upper limb, including shoulder: Secondary | ICD-10-CM | POA: Diagnosis not present

## 2018-07-02 DIAGNOSIS — Z85828 Personal history of other malignant neoplasm of skin: Secondary | ICD-10-CM | POA: Diagnosis not present

## 2018-07-02 DIAGNOSIS — D2261 Melanocytic nevi of right upper limb, including shoulder: Secondary | ICD-10-CM | POA: Diagnosis not present

## 2018-07-02 DIAGNOSIS — D1801 Hemangioma of skin and subcutaneous tissue: Secondary | ICD-10-CM | POA: Diagnosis not present

## 2018-07-17 DIAGNOSIS — H401111 Primary open-angle glaucoma, right eye, mild stage: Secondary | ICD-10-CM | POA: Diagnosis not present

## 2018-07-17 DIAGNOSIS — Z961 Presence of intraocular lens: Secondary | ICD-10-CM | POA: Diagnosis not present

## 2018-07-28 ENCOUNTER — Other Ambulatory Visit: Payer: Self-pay | Admitting: Cardiovascular Disease

## 2018-09-18 DIAGNOSIS — R7881 Bacteremia: Secondary | ICD-10-CM

## 2018-09-18 DIAGNOSIS — B952 Enterococcus as the cause of diseases classified elsewhere: Secondary | ICD-10-CM

## 2018-09-18 HISTORY — DX: Enterococcus as the cause of diseases classified elsewhere: B95.2

## 2018-09-18 HISTORY — DX: Enterococcus as the cause of diseases classified elsewhere: R78.81

## 2018-09-24 DIAGNOSIS — R509 Fever, unspecified: Secondary | ICD-10-CM | POA: Diagnosis not present

## 2018-09-24 DIAGNOSIS — R110 Nausea: Secondary | ICD-10-CM | POA: Diagnosis not present

## 2018-09-24 DIAGNOSIS — R630 Anorexia: Secondary | ICD-10-CM | POA: Diagnosis not present

## 2018-09-25 ENCOUNTER — Other Ambulatory Visit: Payer: Self-pay

## 2018-09-25 ENCOUNTER — Inpatient Hospital Stay (HOSPITAL_COMMUNITY)
Admission: EM | Admit: 2018-09-25 | Discharge: 2018-10-02 | DRG: 314 | Disposition: A | Discharge status: Skilled Nursing Facility | Payer: Medicare Other | Source: Ambulatory Visit | Attending: Internal Medicine | Admitting: Internal Medicine

## 2018-09-25 ENCOUNTER — Emergency Department (HOSPITAL_COMMUNITY): Payer: Medicare Other

## 2018-09-25 ENCOUNTER — Encounter (HOSPITAL_COMMUNITY): Payer: Self-pay | Admitting: Emergency Medicine

## 2018-09-25 DIAGNOSIS — I2510 Atherosclerotic heart disease of native coronary artery without angina pectoris: Secondary | ICD-10-CM | POA: Diagnosis present

## 2018-09-25 DIAGNOSIS — D696 Thrombocytopenia, unspecified: Secondary | ICD-10-CM | POA: Diagnosis not present

## 2018-09-25 DIAGNOSIS — N189 Chronic kidney disease, unspecified: Secondary | ICD-10-CM | POA: Diagnosis not present

## 2018-09-25 DIAGNOSIS — R7881 Bacteremia: Secondary | ICD-10-CM

## 2018-09-25 DIAGNOSIS — Z960 Presence of urogenital implants: Secondary | ICD-10-CM | POA: Diagnosis not present

## 2018-09-25 DIAGNOSIS — M4646 Discitis, unspecified, lumbar region: Secondary | ICD-10-CM | POA: Diagnosis not present

## 2018-09-25 DIAGNOSIS — I739 Peripheral vascular disease, unspecified: Secondary | ICD-10-CM | POA: Diagnosis not present

## 2018-09-25 DIAGNOSIS — E7800 Pure hypercholesterolemia, unspecified: Secondary | ICD-10-CM | POA: Diagnosis present

## 2018-09-25 DIAGNOSIS — E538 Deficiency of other specified B group vitamins: Secondary | ICD-10-CM | POA: Diagnosis present

## 2018-09-25 DIAGNOSIS — I330 Acute and subacute infective endocarditis: Secondary | ICD-10-CM | POA: Diagnosis present

## 2018-09-25 DIAGNOSIS — R011 Cardiac murmur, unspecified: Secondary | ICD-10-CM | POA: Diagnosis not present

## 2018-09-25 DIAGNOSIS — A4181 Sepsis due to Enterococcus: Secondary | ICD-10-CM | POA: Diagnosis present

## 2018-09-25 DIAGNOSIS — Y831 Surgical operation with implant of artificial internal device as the cause of abnormal reaction of the patient, or of later complication, without mention of misadventure at the time of the procedure: Secondary | ICD-10-CM | POA: Diagnosis present

## 2018-09-25 DIAGNOSIS — Z743 Need for continuous supervision: Secondary | ICD-10-CM | POA: Diagnosis not present

## 2018-09-25 DIAGNOSIS — R5381 Other malaise: Secondary | ICD-10-CM | POA: Diagnosis not present

## 2018-09-25 DIAGNOSIS — N390 Urinary tract infection, site not specified: Secondary | ICD-10-CM | POA: Diagnosis not present

## 2018-09-25 DIAGNOSIS — R338 Other retention of urine: Secondary | ICD-10-CM | POA: Diagnosis present

## 2018-09-25 DIAGNOSIS — E785 Hyperlipidemia, unspecified: Secondary | ICD-10-CM | POA: Diagnosis not present

## 2018-09-25 DIAGNOSIS — N179 Acute kidney failure, unspecified: Secondary | ICD-10-CM

## 2018-09-25 DIAGNOSIS — R911 Solitary pulmonary nodule: Secondary | ICD-10-CM | POA: Diagnosis present

## 2018-09-25 DIAGNOSIS — R339 Retention of urine, unspecified: Secondary | ICD-10-CM | POA: Diagnosis not present

## 2018-09-25 DIAGNOSIS — B952 Enterococcus as the cause of diseases classified elsewhere: Secondary | ICD-10-CM

## 2018-09-25 DIAGNOSIS — K5900 Constipation, unspecified: Secondary | ICD-10-CM | POA: Diagnosis present

## 2018-09-25 DIAGNOSIS — R279 Unspecified lack of coordination: Secondary | ICD-10-CM | POA: Diagnosis not present

## 2018-09-25 DIAGNOSIS — M62830 Muscle spasm of back: Secondary | ICD-10-CM | POA: Diagnosis not present

## 2018-09-25 DIAGNOSIS — Z9079 Acquired absence of other genital organ(s): Secondary | ICD-10-CM

## 2018-09-25 DIAGNOSIS — T826XXA Infection and inflammatory reaction due to cardiac valve prosthesis, initial encounter: Secondary | ICD-10-CM | POA: Diagnosis not present

## 2018-09-25 DIAGNOSIS — K449 Diaphragmatic hernia without obstruction or gangrene: Secondary | ICD-10-CM | POA: Diagnosis not present

## 2018-09-25 DIAGNOSIS — Z8679 Personal history of other diseases of the circulatory system: Secondary | ICD-10-CM

## 2018-09-25 DIAGNOSIS — Z452 Encounter for adjustment and management of vascular access device: Secondary | ICD-10-CM | POA: Diagnosis not present

## 2018-09-25 DIAGNOSIS — Z952 Presence of prosthetic heart valve: Secondary | ICD-10-CM

## 2018-09-25 DIAGNOSIS — E871 Hypo-osmolality and hyponatremia: Secondary | ICD-10-CM | POA: Diagnosis not present

## 2018-09-25 DIAGNOSIS — I10 Essential (primary) hypertension: Secondary | ICD-10-CM | POA: Diagnosis not present

## 2018-09-25 DIAGNOSIS — I129 Hypertensive chronic kidney disease with stage 1 through stage 4 chronic kidney disease, or unspecified chronic kidney disease: Secondary | ICD-10-CM | POA: Diagnosis not present

## 2018-09-25 DIAGNOSIS — R32 Unspecified urinary incontinence: Secondary | ICD-10-CM | POA: Diagnosis not present

## 2018-09-25 DIAGNOSIS — M48061 Spinal stenosis, lumbar region without neurogenic claudication: Secondary | ICD-10-CM | POA: Diagnosis not present

## 2018-09-25 DIAGNOSIS — Z8249 Family history of ischemic heart disease and other diseases of the circulatory system: Secondary | ICD-10-CM

## 2018-09-25 DIAGNOSIS — R2689 Other abnormalities of gait and mobility: Secondary | ICD-10-CM | POA: Diagnosis not present

## 2018-09-25 DIAGNOSIS — R41841 Cognitive communication deficit: Secondary | ICD-10-CM | POA: Diagnosis not present

## 2018-09-25 DIAGNOSIS — Z7982 Long term (current) use of aspirin: Secondary | ICD-10-CM

## 2018-09-25 DIAGNOSIS — R109 Unspecified abdominal pain: Secondary | ICD-10-CM | POA: Diagnosis not present

## 2018-09-25 DIAGNOSIS — M6281 Muscle weakness (generalized): Secondary | ICD-10-CM | POA: Diagnosis not present

## 2018-09-25 DIAGNOSIS — M549 Dorsalgia, unspecified: Secondary | ICD-10-CM | POA: Diagnosis not present

## 2018-09-25 DIAGNOSIS — D649 Anemia, unspecified: Secondary | ICD-10-CM | POA: Diagnosis not present

## 2018-09-25 DIAGNOSIS — Z8546 Personal history of malignant neoplasm of prostate: Secondary | ICD-10-CM

## 2018-09-25 DIAGNOSIS — Z951 Presence of aortocoronary bypass graft: Secondary | ICD-10-CM | POA: Diagnosis not present

## 2018-09-25 DIAGNOSIS — Z87891 Personal history of nicotine dependence: Secondary | ICD-10-CM

## 2018-09-25 DIAGNOSIS — Z79899 Other long term (current) drug therapy: Secondary | ICD-10-CM

## 2018-09-25 DIAGNOSIS — I371 Nonrheumatic pulmonary valve insufficiency: Secondary | ICD-10-CM | POA: Diagnosis not present

## 2018-09-25 DIAGNOSIS — E861 Hypovolemia: Secondary | ICD-10-CM | POA: Diagnosis present

## 2018-09-25 DIAGNOSIS — M545 Low back pain: Secondary | ICD-10-CM | POA: Diagnosis not present

## 2018-09-25 DIAGNOSIS — R2681 Unsteadiness on feet: Secondary | ICD-10-CM | POA: Diagnosis not present

## 2018-09-25 DIAGNOSIS — I361 Nonrheumatic tricuspid (valve) insufficiency: Secondary | ICD-10-CM | POA: Diagnosis not present

## 2018-09-25 DIAGNOSIS — K5790 Diverticulosis of intestine, part unspecified, without perforation or abscess without bleeding: Secondary | ICD-10-CM | POA: Diagnosis not present

## 2018-09-25 LAB — LIPASE, BLOOD: Lipase: 35 U/L (ref 11–51)

## 2018-09-25 LAB — COMPREHENSIVE METABOLIC PANEL
ALT: 42 U/L (ref 0–44)
AST: 61 U/L — ABNORMAL HIGH (ref 15–41)
Albumin: 3.5 g/dL (ref 3.5–5.0)
Alkaline Phosphatase: 53 U/L (ref 38–126)
Anion gap: 15 (ref 5–15)
BUN: 59 mg/dL — ABNORMAL HIGH (ref 8–23)
CO2: 20 mmol/L — ABNORMAL LOW (ref 22–32)
Calcium: 9 mg/dL (ref 8.9–10.3)
Chloride: 97 mmol/L — ABNORMAL LOW (ref 98–111)
Creatinine, Ser: 3.53 mg/dL — ABNORMAL HIGH (ref 0.61–1.24)
GFR calc Af Amer: 17 mL/min — ABNORMAL LOW (ref >60)
GFR calc non Af Amer: 15 mL/min — ABNORMAL LOW (ref >60)
Glucose, Bld: 140 mg/dL — ABNORMAL HIGH (ref 70–99)
Potassium: 4.6 mmol/L (ref 3.5–5.1)
Sodium: 132 mmol/L — ABNORMAL LOW (ref 135–145)
Total Bilirubin: 1 mg/dL (ref 0.3–1.2)
Total Protein: 6.4 g/dL — ABNORMAL LOW (ref 6.5–8.1)

## 2018-09-25 LAB — CBC
HCT: 33.5 % — ABNORMAL LOW (ref 39.0–52.0)
Hemoglobin: 10.9 g/dL — ABNORMAL LOW (ref 13.0–17.0)
MCH: 30.3 pg (ref 26.0–34.0)
MCHC: 32.5 g/dL (ref 30.0–36.0)
MCV: 93.1 fL (ref 80.0–100.0)
Platelets: 88 10*3/uL — ABNORMAL LOW (ref 150–400)
RBC: 3.6 MIL/uL — ABNORMAL LOW (ref 4.22–5.81)
RDW: 13.2 % (ref 11.5–15.5)
WBC: 9.2 10*3/uL (ref 4.0–10.5)
nRBC: 0 % (ref 0.0–0.2)

## 2018-09-25 LAB — LACTIC ACID, PLASMA
Lactic Acid, Venous: 2.2 mmol/L (ref 0.5–1.9)
Lactic Acid, Venous: 1.2 mmol/L (ref 0.5–1.9)

## 2018-09-25 LAB — URINALYSIS, ROUTINE W REFLEX MICROSCOPIC
Bilirubin Urine: NEGATIVE
Glucose, UA: NEGATIVE mg/dL
Ketones, ur: 5 mg/dL — AB
Leukocytes,Ua: NEGATIVE
Nitrite: NEGATIVE
Protein, ur: 30 mg/dL — AB
Specific Gravity, Urine: 1.017 (ref 1.005–1.030)
pH: 5 (ref 5.0–8.0)

## 2018-09-25 LAB — BASIC METABOLIC PANEL
Anion gap: 11 (ref 5–15)
BUN: 54 mg/dL — ABNORMAL HIGH (ref 8–23)
CO2: 21 mmol/L — ABNORMAL LOW (ref 22–32)
Calcium: 8.2 mg/dL — ABNORMAL LOW (ref 8.9–10.3)
Chloride: 102 mmol/L (ref 98–111)
Creatinine, Ser: 2.89 mg/dL — ABNORMAL HIGH (ref 0.61–1.24)
GFR calc Af Amer: 21 mL/min — ABNORMAL LOW (ref >60)
GFR calc non Af Amer: 19 mL/min — ABNORMAL LOW (ref >60)
Glucose, Bld: 151 mg/dL — ABNORMAL HIGH (ref 70–99)
Potassium: 3.7 mmol/L (ref 3.5–5.1)
Sodium: 134 mmol/L — ABNORMAL LOW (ref 135–145)

## 2018-09-25 LAB — CK: Total CK: 333 U/L (ref 49–397)

## 2018-09-25 MED ORDER — ASPIRIN EC 81 MG PO TBEC
81.0000 mg | DELAYED_RELEASE_TABLET | Freq: Every day | ORAL | Status: DC
  Administered 2018-09-25 – 2018-10-02 (×8): 81 mg via ORAL
  Filled 2018-09-25 (×8): qty 1

## 2018-09-25 MED ORDER — ACETAMINOPHEN 650 MG RE SUPP: 650.0000 mg | Freq: Four times a day (QID) | RECTAL | Status: DC | PRN

## 2018-09-25 MED ORDER — SODIUM CHLORIDE 0.9 % IV SOLN
INTRAVENOUS | Status: AC
  Administered 2018-09-25: 22:00:00 via INTRAVENOUS

## 2018-09-25 MED ORDER — AMLODIPINE BESYLATE 5 MG PO TABS
5.0000 mg | ORAL_TABLET | Freq: Every day | ORAL | Status: DC
  Filled 2018-09-25: qty 1

## 2018-09-25 MED ORDER — LIDOCAINE HCL URETHRAL/MUCOSAL 2 % EX GEL
1.0000 "application " | Freq: Once | CUTANEOUS | Status: AC
  Administered 2018-09-25: 1 via TOPICAL
  Filled 2018-09-25: qty 20

## 2018-09-25 MED ORDER — HYDROMORPHONE HCL 1 MG/ML IJ SOLN
0.5000 mg | INTRAMUSCULAR | Status: DC | PRN
  Administered 2018-09-25 – 2018-09-27 (×6): 0.5 mg via INTRAVENOUS
  Filled 2018-09-25 (×6): qty 1

## 2018-09-25 MED ORDER — SODIUM CHLORIDE 0.9 % IV BOLUS
1000.0000 mL | Freq: Once | INTRAVENOUS | Status: AC
  Administered 2018-09-25: 18:00:00 1000 mL via INTRAVENOUS

## 2018-09-25 MED ORDER — METOPROLOL SUCCINATE ER 50 MG PO TB24
50.0000 mg | ORAL_TABLET | Freq: Every day | ORAL | Status: DC
  Administered 2018-09-26 – 2018-10-02 (×6): 50 mg via ORAL
  Filled 2018-09-25 (×8): qty 1

## 2018-09-25 MED ORDER — SODIUM CHLORIDE 0.9 % IV SOLN
1.0000 g | INTRAVENOUS | Status: DC
  Administered 2018-09-25: 1 g via INTRAVENOUS
  Filled 2018-09-25 (×2): qty 10

## 2018-09-25 MED ORDER — ACETAMINOPHEN 325 MG PO TABS
650.0000 mg | ORAL_TABLET | Freq: Four times a day (QID) | ORAL | Status: DC | PRN
  Administered 2018-09-25 – 2018-09-28 (×4): 650 mg via ORAL
  Filled 2018-09-25 (×3): qty 2

## 2018-09-25 MED ORDER — HYDROMORPHONE HCL 1 MG/ML IJ SOLN
0.5000 mg | Freq: Once | INTRAMUSCULAR | Status: AC
  Administered 2018-09-25: 0.5 mg via INTRAVENOUS
  Filled 2018-09-25: qty 1

## 2018-09-25 MED ORDER — ENOXAPARIN SODIUM 30 MG/0.3ML ~~LOC~~ SOLN: 30.0000 mg | SUBCUTANEOUS | Status: DC

## 2018-09-25 MED ORDER — ONDANSETRON HCL 4 MG PO TABS: 4.0000 mg | ORAL_TABLET | Freq: Four times a day (QID) | ORAL | Status: DC | PRN

## 2018-09-25 MED ORDER — ONDANSETRON HCL 4 MG/2ML IJ SOLN
4.0000 mg | Freq: Four times a day (QID) | INTRAMUSCULAR | Status: DC | PRN
  Administered 2018-09-25 – 2018-09-26 (×3): 4 mg via INTRAVENOUS
  Filled 2018-09-25 (×3): qty 2

## 2018-09-25 MED ORDER — SODIUM CHLORIDE 0.9 % IV BOLUS
1000.0000 mL | Freq: Once | INTRAVENOUS | Status: AC
  Administered 2018-09-25: 14:00:00 1000 mL via INTRAVENOUS

## 2018-09-25 NOTE — ED Notes (Signed)
Bladder scan done multiple times - 65ml shown each time.

## 2018-09-25 NOTE — ED Notes (Signed)
Patient's wife, Zahir Eisenhour, may be reached at [phone].

## 2018-09-25 NOTE — ED Notes (Signed)
Patient transported to CT 

## 2018-09-25 NOTE — ED Notes (Signed)
Bladder scan repeated following 1L NS bolus - showed 67ml.

## 2018-09-25 NOTE — ED Provider Notes (Signed)
[facility] Emergency Department Provider Note MRN:  [id number]  Arrival date & time: 09/25/18     Chief Complaint   Back Pain and Dysuria   History of Present Illness   Randy Chen is a 85 y.o. year-old male with a history of CAD status post CABG presenting to the ED with chief complaint of back pain.  Patient is endorsing sudden onset bilateral flank pain that began yesterday evening, has persisted since that time, constant, 10 out of 10 in severity, described as a sharp pain.  Denies hematuria or dysuria.  Denies fever, no cough, no chest pain, no shortness of breath, no abdominal pain.  Denies numbness weakness to the arms or legs.  No exacerbating relieving factors.  Patient does endorse difficulty urinating, which he describes as not making very much urine despite straining to produce some.  Sent here by urgent care to be evaluated for possible kidney failure or stone.  Review of Systems  A complete 10 system review of systems was obtained and all systems are negative except as noted in the HPI and PMH.   Patient's Health History    Past Medical History:  Diagnosis Date  . Aortic stenosis   . Coronary artery disease    CABG x5 - 1987  . History of atherosclerotic cardiovascular disease   . History of prostatectomy   . Hyperlipidemia   . Hypertension   . PAD (peripheral artery disease) ([place]) 06/06/2017    Past Surgical History:  Procedure Laterality Date  . CARDIAC CATHETERIZATION     EF of 66%  . CARDIAC VALVE REPLACEMENT  2006  . CORONARY ARTERY BYPASS GRAFT  1987   x5  . I&D EXTREMITY Left 01/10/2015   Procedure: DEBRIDEMENT ANKLE PLACEMENT ANTIBIOTIC BEADS AND WOUND VAC;  Surgeon: [name], MD;  Location: [place];  Service: Orthopedics;  Laterality: Left;    Family History  Problem Relation Age of Onset  . Heart disease Father   . Heart failure Mother   . Heart attack Brother   . Congestive Heart Failure Sister   . Congestive Heart  Failure Sister     Social History   Socioeconomic History  . Marital status: Married    Spouse name: Not on file  . Number of children: Not on file  . Years of education: Not on file  . Highest education level: Not on file  Occupational History  . Not on file  Social Needs  . Financial resource strain: Not on file  . Food insecurity:    Worry: Not on file    Inability: Not on file  . Transportation needs:    Medical: Not on file    Non-medical: Not on file  Tobacco Use  . Smoking status: Former Smoker    Last attempt to quit: 11/18/1955    Years since quitting: 62.8  . Smokeless tobacco: Never Used  Substance and Sexual Activity  . Alcohol use: No  . Drug use: No  . Sexual activity: Not on file  Lifestyle  . Physical activity:    Days per week: Not on file    Minutes per session: Not on file  . Stress: Not on file  Relationships  . Social connections:    Talks on phone: Not on file    Gets together: Not on file    Attends religious service: Not on file    Active member of club or organization: Not on file    Attends meetings of  clubs or organizations: Not on file    Relationship status: Not on file  . Intimate partner violence:    Fear of current or ex partner: Not on file    Emotionally abused: Not on file    Physically abused: Not on file    Forced sexual activity: Not on file  Other Topics Concern  . Not on file  Social History Narrative  . Not on file     Physical Exam  Vital Signs and Nursing Notes reviewed Vitals:   09/25/18 1425 09/25/18 1505  BP: (!) 106/51 (!) 101/55  Pulse: 90 84  Resp: 19 20  Temp:    SpO2: 96% 95%    CONSTITUTIONAL: Well-appearing, NAD NEURO:  Alert and oriented x 3, no focal deficits EYES:  eyes equal and reactive ENT/NECK:  no LAD, no JVD CARDIO: Regular rate, well-perfused, normal S1 and S2 PULM:  CTAB no wheezing or rhonchi GI/GU:  normal bowel sounds, non-distended, non-tender MSK/SPINE:  No gross deformities, no  edema SKIN:  no rash, atraumatic PSYCH:  Appropriate speech and behavior  Diagnostic and Interventional Summary    EKG Interpretation  Date/Time:  Wednesday September 25 2018 13:29:06 EDT Ventricular Rate:  96 PR Interval:    QRS Duration: 103 QT Interval:  367 QTC Calculation: 464 R Axis:   63 Text Interpretation:  Sinus rhythm Nonspecific T abnormalities, lateral leads Confirmed by Gerlene Fee [phone]) on 09/25/2018 3:24:32 PM      Labs Reviewed  CBC - Abnormal; Notable for the following components:      Result Value   RBC 3.60 (*)    Hemoglobin 10.9 (*)    HCT 33.5 (*)    Platelets 88 (*)    All other components within normal limits  COMPREHENSIVE METABOLIC PANEL - Abnormal; Notable for the following components:   Sodium 132 (*)    Chloride 97 (*)    CO2 20 (*)    Glucose, Bld 140 (*)    BUN 59 (*)    Creatinine, Ser 3.53 (*)    Total Protein 6.4 (*)    AST 61 (*)    GFR calc non Af Amer 15 (*)    GFR calc Af Amer 17 (*)    All other components within normal limits  LACTIC ACID, PLASMA - Abnormal; Notable for the following components:   Lactic Acid, Venous 2.2 (*)    All other components within normal limits  LIPASE, BLOOD  URINALYSIS, ROUTINE W REFLEX MICROSCOPIC    CT RENAL STONE STUDY  Final Result      Medications  sodium chloride 0.9 % bolus 1,000 mL (0 mLs Intravenous Stopped 09/25/18 1504)  HYDROmorphone (DILAUDID) injection 0.5 mg (0.5 mg Intravenous Given 09/25/18 1405)  HYDROmorphone (DILAUDID) injection 0.5 mg (0.5 mg Intravenous Given 09/25/18 1442)     Procedures Critical Care  ED Course and Medical Decision Making  I have reviewed the triage vital signs and the nursing notes.  Pertinent labs & imaging results that were available during my care of the patient were reviewed by me and considered in my medical decision making (see below for details).  Considering obstructive uropathy, kidney stone, kidney failure, aortic pathology in this 85 year old  male with bilateral flank pain, decreased urine production.  Will need labs, CT evaluation.  CT reveals no acute aortic pathology, no kidney stones, but a full bladder despite ultrasound bladder scans that did not reveal any urine.  Labs reveal AKI, suspect obstructive uropathy as the cause of  the AKI.  Will admit to hospitalist service for further care, will insert Foley catheter here in the ED and send for urinalysis.  Dr. Olevia Bowens the accepting hospitalist for admission.  Barth Kirks. Sedonia Small, [place] [email or web address]  Final Clinical Impressions(s) / ED Diagnoses     ICD-10-CM   1. AKI (acute kidney injury) [facility]) N17.9     ED Discharge Orders    None         Maudie Flakes, MD 09/25/18 [phone]

## 2018-09-25 NOTE — H&P (Addendum)
History and Physical    Randy Chen BTD:[id number] DOB: 07-28-1927 DOA: 09/25/2018  PCP: [name], MD   Patient coming from: Home.  I have personally briefly reviewed patient's old medical records in [place]  Chief Complaint: Back pain and decreased urine.  HPI: Randy Chen is a 85 y.o. male with medical history significant of aortic stenosis, CAD/CABG x5 in 1987, PAD, prostate CA, prostatectomy, hyperlipidemia, hypertension who is coming to the emergency department with complaints of fever, chills, fatigue, decreased oral intake, oliguria with dark amber color urine, suprapubic and lower back pain since about 4 days, but has particularly worsened since last night.  He stated that he was prescribed cephalexin by his doctor.  However, he was sent to an urgent care center due to worsening of her symptoms since last night, but then was subsequently referred to the emergency department.  He denies chest pain, dyspnea, palpitations, dizziness, diaphoresis, PND, orthopnea or pitting edema of the lower extremities.  Denies nausea, emesis, diarrhea, constipation, melena or hematochezia.  He denies polyuria, polydipsia, polyphagia or blurred vision.   ED Course: Initial vital signs temperature 98.1 F, pulse 96, respiration 18, blood pressure 107/66 mmHg and O2 sat 100% on room air.  The patient received hydromorphone 0.5 mg IVP x2 doses and 2000 mL of NS bolus.  Urinalysis cloudy, with moderate hemoglobinuria, ketones of 5 and proteinuria of 30 mg/dL.  11-20 RBC and 11-20 WBC with a few bacteria on microscopic exam.  White count is 9.2, hemoglobin 10.9 g/dL and platelets 88.  Lipase was normal.  Lactic acid was 2.2, sodium 132, potassium 4.6, chloride 97 and CO2 20 mmol/L.  Glucose 140, BUN 59, creatinine 3.53 and calcium 9.0 mg/dL.  Total protein is 6.4 and AST was mildly elevated at 61.  All other hepatic functions are normal.  Review of Systems: As per HPI otherwise 10 point  review of systems negative.   Past Medical History:  Diagnosis Date   Aortic stenosis    Coronary artery disease    CABG x5 - 1987   History of atherosclerotic cardiovascular disease    History of prostatectomy    Hyperlipidemia    Hypertension    PAD (peripheral artery disease) ([place]) 06/06/2017    Past Surgical History:  Procedure Laterality Date   CARDIAC CATHETERIZATION     EF of 66%   CARDIAC VALVE REPLACEMENT  2006   CORONARY ARTERY BYPASS GRAFT  1987   x5   I&D EXTREMITY Left 01/10/2015   Procedure: DEBRIDEMENT ANKLE PLACEMENT ANTIBIOTIC BEADS AND WOUND VAC;  Surgeon: [name], MD;  Location: [facility];  Service: Orthopedics;  Laterality: Left;     reports that he quit smoking about 62 years ago. He has never used smokeless tobacco. He reports that he does not drink alcohol or use drugs.  No Known Allergies  Family History  Problem Relation Age of Onset   Heart disease Father    Heart failure Mother    Heart attack Brother    Congestive Heart Failure Sister    Congestive Heart Failure Sister    Prior to Admission medications   Medication Sig Start Date End Date Taking? Authorizing Provider  amLODipine (NORVASC) 5 MG tablet TAKE 1 TABLET BY MOUTH DAILY Patient taking differently: Take 5 mg by mouth daily.  07/29/18   Skeet Latch, MD  aspirin EC 81 MG tablet Take 81 mg by mouth daily.    [provider]  atorvastatin (LIPITOR) 40 MG  tablet Take 40 mg by mouth daily.    [provider]  Calcium Carb-Cholecalciferol (CALCIUM 1000 + D PO) Take 1,000 mg by mouth daily.    [provider]  Cholecalciferol (VITAMIN D) 2000 UNITS CAPS Take 2,000 Units by mouth daily.    [provider]  folic acid (FOLVITE) 735 MCG tablet Take 400 mcg by mouth daily.     [provider]  Glucosamine-Chondroit-Vit C-Mn (GLUCOSAMINE 1500 COMPLEX PO) Take 1,500 mg by mouth daily.    [provider]  lisinopril  (PRINIVIL,ZESTRIL) 40 MG tablet Take 40 mg by mouth daily.     [provider]  metoprolol succinate (TOPROL-XL) 50 MG 24 hr tablet TAKE 1 TABLET BY MOUTH DAILY. DUE FOR FOLLOW UP APPOINTMENT. KEEP OFFICE VISIT Patient taking differently: Take 50 mg by mouth daily.  02/21/18   Skeet Latch, MD  Multiple Vitamin (MULTIVITAMIN WITH MINERALS) TABS Take 1 tablet by mouth daily. Theragran    [provider]  nitroGLYCERIN (NITROSTAT) 0.4 MG SL tablet Place 1 tablet (0.4 mg total) under the tongue every 5 (five) minutes as needed for chest pain. 06/28/16 09/26/16  Skeet Latch, MD  timolol (TIMOPTIC) 0.5 % ophthalmic solution Place 1 drop into the right eye 2 (two) times daily.  11/07/17   [provider]  vitamin C (ASCORBIC ACID) 500 MG tablet Take 500 mg by mouth daily.    [provider]    Physical Exam: Vitals:   09/25/18 1630 09/25/18 1645 09/25/18 1700 09/25/18 1730  BP: 126/62 (!) 124/59 130/61 134/60  Pulse: 90 89 91 92  Resp: (!) 24 (!) 23 17 (!) 21  Temp:      TempSrc:      SpO2: 99% 98% 99% 98%    Constitutional: NAD, calm, comfortable Eyes: PERRL, lids and conjunctivae normal ENMT: Mucous membranes are mildly dry. Posterior pharynx clear of any exudate or lesions. Neck: normal, supple, no masses, no thyromegaly Respiratory: clear to auscultation bilaterally, no wheezing, no crackles. Normal respiratory effort. No accessory muscle use.  Cardiovascular: Regular rate and rhythm, no murmurs / rubs / gallops. No extremity edema. 2+ pedal pulses. No carotid bruits.  Abdomen: Soft, mild positive suprapubic tenderness, no guarding or rebound, no flank tenderness, no masses palpated. No hepatosplenomegaly. Bowel sounds positive.  Musculoskeletal: no clubbing / cyanosis. No joint deformity upper and lower extremities. Good ROM, no contractures. Normal muscle tone.  Skin: no clinically significant rashes, lesions, ulcers on limited dermatological  examination. Neurologic: CN 2-12 grossly intact. Sensation intact, DTR normal. Strength 5/5 in all 4.  Psychiatric: Normal judgment and insight. Alert and oriented x 3. Normal mood.   Labs on Admission: I have personally reviewed following labs and imaging studies  CBC: Recent Labs  Lab 09/25/18 1359  WBC 9.2  HGB 10.9*  HCT 33.5*  MCV 93.1  PLT 88*   Basic Metabolic Panel: Recent Labs  Lab 09/25/18 1359  NA 132*  K 4.6  CL 97*  CO2 20*  GLUCOSE 140*  BUN 59*  CREATININE 3.53*  CALCIUM 9.0   GFR: CrCl cannot be calculated (Unknown ideal weight.). Liver Function Tests: Recent Labs  Lab 09/25/18 1359  AST 61*  ALT 42  ALKPHOS 53  BILITOT 1.0  PROT 6.4*  ALBUMIN 3.5   Recent Labs  Lab 09/25/18 1359  LIPASE 35   No results for input(s): AMMONIA in the last 168 hours. Coagulation Profile: No results for input(s): INR, PROTIME in the last 168 hours.  Cardiac Enzymes: No results for input(s): CKTOTAL, CKMB, CKMBINDEX, TROPONINI in the last 168 hours. BNP (last 3 results) No results for input(s): PROBNP in the last 8760 hours. HbA1C: No results for input(s): HGBA1C in the last 72 hours. CBG: No results for input(s): GLUCAP in the last 168 hours. Lipid Profile: No results for input(s): CHOL, HDL, LDLCALC, TRIG, CHOLHDL, LDLDIRECT in the last 72 hours. Thyroid Function Tests: No results for input(s): TSH, T4TOTAL, FREET4, T3FREE, THYROIDAB in the last 72 hours. Anemia Panel: No results for input(s): VITAMINB12, FOLATE, FERRITIN, TIBC, IRON, RETICCTPCT in the last 72 hours. Urine analysis:    Component Value Date/Time   COLORURINE YELLOW 09/25/2018 1642   APPEARANCEUR CLOUDY (A) 09/25/2018 1642   LABSPEC 1.017 09/25/2018 1642   PHURINE 5.0 09/25/2018 1642   GLUCOSEU NEGATIVE 09/25/2018 1642   HGBUR MODERATE (A) 09/25/2018 1642   BILIRUBINUR NEGATIVE 09/25/2018 1642   KETONESUR 5 (A) 09/25/2018 1642   PROTEINUR 30 (A) 09/25/2018 1642   UROBILINOGEN 0.2  01/09/2015 1853   NITRITE NEGATIVE 09/25/2018 1642   LEUKOCYTESUR NEGATIVE 09/25/2018 1642    Radiological Exams on Admission: Ct Renal Stone Study  Result Date: 09/25/2018 CLINICAL DATA:  Lower back pain for 2 days EXAM: CT ABDOMEN AND PELVIS WITHOUT CONTRAST TECHNIQUE: Multidetector CT imaging of the abdomen and pelvis was performed following the standard protocol without IV contrast. COMPARISON:  06/24/2012 FINDINGS: Lower chest: 11 mm mildly lobulated nodule is noted increased from prior exam at which time it measured 9 mm. Given the slow growth this is likely benign in etiology although follow-up is recommended as described below. Hepatobiliary: No focal liver abnormality is seen. No gallstones, gallbladder wall thickening, or biliary dilatation. Pancreas: Unremarkable. No pancreatic ductal dilatation or surrounding inflammatory changes. Spleen: Normal in size without focal abnormality. Adrenals/Urinary Tract: Adrenal glands are within normal limits. The kidneys are well visualized bilaterally without evidence of renal calculi or obstructive change. Bladder is well distended. Stomach/Bowel: The appendix is within normal limits. Diverticular change of the colon is noted without evidence of diverticulitis. Small bowel and stomach are within normal limits with the exception of a sliding-type hiatal hernia. Vascular/Lymphatic: Aortic atherosclerosis. No enlarged abdominal or pelvic lymph nodes. Reproductive: Prostate is been surgically removed. Other: No abdominal wall hernia or abnormality. No abdominopelvic ascites. Musculoskeletal: Degenerative changes of lumbar spine are noted. IMPRESSION: Diverticulosis without diverticulitis. Sliding-type hiatal hernia. Lobulated right lower lobe nodule increased 2 mm over 6 years. This is likely benign in etiology. Repeat chest CT in 3 months is recommended to assess for stability. Electronically Signed   By: Inez Catalina M.D.   On: 09/25/2018 15:39    EKG:  Independently reviewed.  Vent. rate 96 BPM PR interval * ms QRS duration 103 ms QT/QTc 367/464 ms P-R-T axes 88 63 -25 Sinus rhythm Nonspecific T abnormalities, lateral leads  Assessment/Plan Principal Problem:   AKI (acute kidney injury) ([place]) Secondary to decreased intake/fever/ACE inhibitor. BUN to creatinine ratio is about 20. Observation/telemetry. Continue IV fluids. Monitor intake and output. Hold lisinopril. Follow-up renal function and electrolytes. Consider nephrology evaluation if no improvement.  Active Problems:   Bladder retention Continue Foley catheter drainage. Not sure, if the patient had a UTI. However, I will hold cephalexin and begin ceftriaxone.    Hypercholesterolemia Hold statin. Check total CK.    Hypertension Hold ACE inhibitor. Continue amlodipine 5 mg p.o. daily. Continue metoprolol ER 50 mg p.o. daily. Monitor BP and HR.    History of atherosclerotic cardiovascular disease  Continue beta-blocker and antiplatelet therapy. Resume statin if total CK is normal.    Anemia  Check anemia panel. Monitor H&H.    Hyponatremia [place] to decreased oral intake. Continue normal saline infusion. Follow-up level in a.m.    Pulmonary nodule Advised to follow-up with chest CT in 3 months.   DVT prophylaxis: Lovenox SQ. Code Status: Full code. Family Communication: Disposition Plan: Observation for IV hydration/renal function follow-up. Consults called:  Admission status: Observation/telemetry.   Reubin Milan MD [facility]  09/25/2018, 5:42 PM   This document was prepared using Dragon voice recognition software and may contain some unintended transcription errors.

## 2018-09-25 NOTE — ED Triage Notes (Signed)
Patient reports fevers/chills and oliguria onset 4 days ago and lower back pain since last night that has severely worsened. Patient states he's been afebrile for 2 days, but has had increase in fatigue.

## 2018-09-25 NOTE — ED Notes (Addendum)
ED TO INPATIENT HANDOFF REPORT  ED Nurse Name and Phone #: Nigel Mormon [phone]  S Name/Age/Gender Randy Chen 85 y.o. male Room/Bed: 016C/016C  Code Status   Code Status: Full Code  Home/SNF/Other Home Patient oriented to: self, place, time and situation Is this baseline? Yes   Triage Complete: Triage complete  Chief Complaint kidney pain  Triage Note Patient reports fevers/chills and oliguria onset 4 days ago and lower back pain since last night that has severely worsened. Patient states he's been afebrile for 2 days, but has had increase in fatigue.    Allergies No Known Allergies  Level of Care/Admitting Diagnosis ED Disposition    ED Disposition Condition Comment   Admit  Hospital Area: [facility] [[id number]]  Level of Care: Telemetry Medical [104]  I expect the patient will be discharged within 24 hours: No (not a candidate for 5C-Observation unit)  Diagnosis: AKI (acute kidney injury) [facility]) [989211]  Admitting Physician: Reubin Milan [[id number]]  Attending Physician: Reubin Milan [[id number]]  PT Class (Do Not Modify): Observation [104]  PT Acc Code (Do Not Modify): Observation [10022]       B Medical/Surgery History Past Medical History:  Diagnosis Date  . Aortic stenosis   . Coronary artery disease    CABG x5 - 1987  . History of atherosclerotic cardiovascular disease   . History of prostatectomy   . Hyperlipidemia   . Hypertension   . PAD (peripheral artery disease) ([place]) 06/06/2017   Past Surgical History:  Procedure Laterality Date  . CARDIAC CATHETERIZATION     EF of 66%  . CARDIAC VALVE REPLACEMENT  2006  . CORONARY ARTERY BYPASS GRAFT  1987   x5  . I&D EXTREMITY Left 01/10/2015   Procedure: DEBRIDEMENT ANKLE PLACEMENT ANTIBIOTIC BEADS AND WOUND VAC;  Surgeon: [name], MD;  Location: [place];  Service: Orthopedics;  Laterality: Left;     A IV Location/Drains/Wounds Patient Lines/Drains/Airways  Status   Active Line/Drains/Airways    Name:   Placement date:   Placement time:   Site:   Days:   Peripheral IV 09/25/18 Right Antecubital   09/25/18    1402    Antecubital   less than 1   PICC / Midline Single Lumen 63/14/97 PICC Right Basilic 43 cm 2 cm   02/63/78    5885    Basilic   0277   Urethral Catheter Tonita Cong, RN Non-latex 16 Fr.   09/25/18    1638    Non-latex   less than 1   Incision (Closed) 01/10/15 Ankle Left   01/10/15    0837     1354   Wound / Incision (Open or Dehisced) 01/08/15 [place] Ankle Left was injury recieved when heading to [facility].    01/08/15    1601    Ankle   1356          Intake/Output Last 24 hours  Intake/Output Summary (Last 24 hours) at 09/25/2018 1747 Last data filed at 09/25/2018 1504 Gross per 24 hour  Intake 1000 ml  Output -  Net 1000 ml    Labs/Imaging Results for orders placed or performed during the hospital encounter of 09/25/18 (from the past 48 hour(s))  CBC     Status: Abnormal   Collection Time: 09/25/18  1:59 PM  Result Value Ref Range   WBC 9.2 4.0 - 10.5 K/uL   RBC 3.60 (L) 4.22 - 5.81 MIL/uL   Hemoglobin 10.9 (L) 13.0 -  17.0 g/dL   HCT 33.5 (L) 39.0 - 52.0 %   MCV 93.1 80.0 - 100.0 fL   MCH 30.3 26.0 - 34.0 pg   MCHC 32.5 30.0 - 36.0 g/dL   RDW 13.2 11.5 - 15.5 %   Platelets 88 (L) 150 - 400 K/uL    Comment: REPEATED TO VERIFY PLATELET COUNT CONFIRMED BY SMEAR Immature Platelet Fraction may be clinically indicated, consider ordering this additional test [id number]    nRBC 0.0 0.0 - 0.2 %    Comment: Performed at [facility], [place] [address], [place], [place] [address]  Comprehensive metabolic panel     Status: Abnormal   Collection Time: 09/25/18  1:59 PM  Result Value Ref Range   Sodium 132 (L) 135 - 145 mmol/L   Potassium 4.6 3.5 - 5.1 mmol/L   Chloride 97 (L) 98 - 111 mmol/L   CO2 20 (L) 22 - 32 mmol/L   Glucose, Bld 140 (H) 70 - 99 mg/dL   BUN 59 (H) 8 - 23 mg/dL   Creatinine, Ser 3.53 (H) 0.61 -  1.24 mg/dL   Calcium 9.0 8.9 - 10.3 mg/dL   Total Protein 6.4 (L) 6.5 - 8.1 g/dL   Albumin 3.5 3.5 - 5.0 g/dL   AST 61 (H) 15 - 41 U/L   ALT 42 0 - 44 U/L   Alkaline Phosphatase 53 38 - 126 U/L   Total Bilirubin 1.0 0.3 - 1.2 mg/dL   GFR calc non Af Amer 15 (L) >60 mL/min   GFR calc Af Amer 17 (L) >60 mL/min   Anion gap 15 5 - 15    Comment: Performed at [facility], [place] [address], [place], [place] [address]  Lipase, blood     Status: None   Collection Time: 09/25/18  1:59 PM  Result Value Ref Range   Lipase 35 11 - 51 U/L    Comment: Performed at [facility] [address], [place], [place] [address]  Lactic acid, plasma     Status: Abnormal   Collection Time: 09/25/18  1:59 PM  Result Value Ref Range   Lactic Acid, Venous 2.2 (HH) 0.5 - 1.9 mmol/L    Comment: CRITICAL RESULT CALLED TO, READ BACK BY AND VERIFIED WITH: Avaiyah Strubel,B RN @ 1503 09/25/18 LEONARD,A Performed at [facility], [place] [address], [place], [place] [address]   Urinalysis, Routine w reflex microscopic     Status: Abnormal   Collection Time: 09/25/18  4:42 PM  Result Value Ref Range   Color, Urine YELLOW YELLOW   APPearance CLOUDY (A) CLEAR   Specific Gravity, Urine 1.017 1.005 - 1.030   pH 5.0 5.0 - 8.0   Glucose, UA NEGATIVE NEGATIVE mg/dL   Hgb urine dipstick MODERATE (A) NEGATIVE   Bilirubin Urine NEGATIVE NEGATIVE   Ketones, ur 5 (A) NEGATIVE mg/dL   Protein, ur 30 (A) NEGATIVE mg/dL   Nitrite NEGATIVE NEGATIVE   Leukocytes,Ua NEGATIVE NEGATIVE   RBC / HPF 11-20 0 - 5 RBC/hpf   WBC, UA 11-20 0 - 5 WBC/hpf   Bacteria, UA FEW (A) NONE SEEN   Squamous Epithelial / LPF 0-5 0 - 5   Mucus PRESENT    Hyaline Casts, UA PRESENT     Comment: Performed at [facility], [place] [address] [place], [place] [address]   Ct Renal Stone Study  Result Date: 09/25/2018 CLINICAL DATA:  Lower back pain for 2 days EXAM: CT  ABDOMEN AND PELVIS WITHOUT CONTRAST TECHNIQUE: Multidetector CT imaging of  the abdomen and pelvis was performed following the standard protocol without IV contrast. COMPARISON:  06/24/2012 FINDINGS: Lower chest: 11 mm mildly lobulated nodule is noted increased from prior exam at which time it measured 9 mm. Given the slow growth this is likely benign in etiology although follow-up is recommended as described below. Hepatobiliary: No focal liver abnormality is seen. No gallstones, gallbladder wall thickening, or biliary dilatation. Pancreas: Unremarkable. No pancreatic ductal dilatation or surrounding inflammatory changes. Spleen: Normal in size without focal abnormality. Adrenals/Urinary Tract: Adrenal glands are within normal limits. The kidneys are well visualized bilaterally without evidence of renal calculi or obstructive change. Bladder is well distended. Stomach/Bowel: The appendix is within normal limits. Diverticular change of the colon is noted without evidence of diverticulitis. Small bowel and stomach are within normal limits with the exception of a sliding-type hiatal hernia. Vascular/Lymphatic: Aortic atherosclerosis. No enlarged abdominal or pelvic lymph nodes. Reproductive: Prostate is been surgically removed. Other: No abdominal wall hernia or abnormality. No abdominopelvic ascites. Musculoskeletal: Degenerative changes of lumbar spine are noted. IMPRESSION: Diverticulosis without diverticulitis. Sliding-type hiatal hernia. Lobulated right lower lobe nodule increased 2 mm over 6 years. This is likely benign in etiology. Repeat chest CT in 3 months is recommended to assess for stability. Electronically Signed   By: Inez Catalina M.D.   On: 09/25/2018 15:39    Pending Labs Unresulted Labs (From admission, onward)    Start     Ordered   10/02/18 0500  Creatinine, serum  (enoxaparin (LOVENOX)    CrCl < 30 ml/min)  Weekly,   R    Comments:  while on enoxaparin therapy.    09/25/18 1712   09/26/18 0500  CBC WITH DIFFERENTIAL  Daily,   R     09/25/18 1712   09/26/18  0500  Comprehensive metabolic panel  Tomorrow morning,   R     09/25/18 1712   09/26/18 0500  Vitamin B12  (Anemia Panel (PNL))  Tomorrow morning,   R     09/25/18 1737   09/26/18 0500  Folate  (Anemia Panel (PNL))  Tomorrow morning,   R     09/25/18 1737   09/26/18 0500  Iron and TIBC  (Anemia Panel (PNL))  Tomorrow morning,   R     09/25/18 1737   09/26/18 0500  Ferritin  (Anemia Panel (PNL))  Tomorrow morning,   R     09/25/18 1737   09/26/18 0500  Reticulocytes  (Anemia Panel (PNL))  Tomorrow morning,   R     09/25/18 1737   09/25/18 1930  Lactic acid, plasma  Once,   R     09/25/18 1738   09/25/18 5102  Basic metabolic panel  Once,   R     09/25/18 1738   09/25/18 1930  CK  Once,   R     09/25/18 1740          Vitals/Pain Today's Vitals   09/25/18 1700 09/25/18 1730 09/25/18 1741 09/25/18 1745  BP: 130/61 134/60  129/68  Pulse: 91 92  89  Resp: 17 (!) 21  (!) 21  Temp:      TempSrc:      SpO2: 99% 98%  98%  PainSc:   10-Worst pain ever     Isolation Precautions No active isolations  Medications Medications  enoxaparin (LOVENOX) injection 30 mg (has no administration in time range)  0.9 %  sodium  chloride infusion (has no administration in time range)  ondansetron (ZOFRAN) tablet 4 mg (has no administration in time range)    Or  ondansetron (ZOFRAN) injection 4 mg (has no administration in time range)  acetaminophen (TYLENOL) tablet 650 mg (has no administration in time range)    Or  acetaminophen (TYLENOL) suppository 650 mg (has no administration in time range)  HYDROmorphone (DILAUDID) injection 0.5 mg (0.5 mg Intravenous Given 09/25/18 1744)  sodium chloride 0.9 % bolus 1,000 mL (0 mLs Intravenous Stopped 09/25/18 1504)  HYDROmorphone (DILAUDID) injection 0.5 mg (0.5 mg Intravenous Given 09/25/18 1405)  HYDROmorphone (DILAUDID) injection 0.5 mg (0.5 mg Intravenous Given 09/25/18 1442)  lidocaine (XYLOCAINE) 2 % jelly 1 application (1 application Topical Given  09/25/18 1614)  sodium chloride 0.9 % bolus 1,000 mL (1,000 mLs Intravenous New Bag/Given 09/25/18 1743)    Mobility walks Low fall risk   Focused Assessments Pulmonary Assessment Handoff:  Lung sounds: Bilateral Breath Sounds: Clear, Diminished O2 Device: Room Air        R Recommendations: See Admitting Provider Note  Report given to: Angus Palms, RN [facility] [place]  Additional Notes:

## 2018-09-25 NOTE — Progress Notes (Signed)
New Admission Note:   Arrival Method: stretcher Mental Orientation: alert and oriented Telemetry: box 08  Assessment: Completed Skin: Intact IV: infusing 100cc/hr NS Pain: denies  Tubes: None Safety Measures: Safety Fall Prevention Plan has been discussed  Admission:  5 [place] Orientation: Patient has been orientated to the room, unit and staff.   Family: none present  Orders to be reviewed and implemented. Will continue to monitor the patient. Call light has been placed within reach and bed alarm has been activated.

## 2018-09-26 ENCOUNTER — Inpatient Hospital Stay (HOSPITAL_COMMUNITY): Payer: Medicare Other

## 2018-09-26 DIAGNOSIS — I371 Nonrheumatic pulmonary valve insufficiency: Secondary | ICD-10-CM | POA: Diagnosis not present

## 2018-09-26 DIAGNOSIS — K5900 Constipation, unspecified: Secondary | ICD-10-CM | POA: Diagnosis present

## 2018-09-26 DIAGNOSIS — E538 Deficiency of other specified B group vitamins: Secondary | ICD-10-CM | POA: Diagnosis present

## 2018-09-26 DIAGNOSIS — A4181 Sepsis due to Enterococcus: Secondary | ICD-10-CM | POA: Diagnosis present

## 2018-09-26 DIAGNOSIS — Z8546 Personal history of malignant neoplasm of prostate: Secondary | ICD-10-CM

## 2018-09-26 DIAGNOSIS — M48061 Spinal stenosis, lumbar region without neurogenic claudication: Secondary | ICD-10-CM | POA: Diagnosis present

## 2018-09-26 DIAGNOSIS — I361 Nonrheumatic tricuspid (valve) insufficiency: Secondary | ICD-10-CM

## 2018-09-26 DIAGNOSIS — R7881 Bacteremia: Secondary | ICD-10-CM | POA: Diagnosis not present

## 2018-09-26 DIAGNOSIS — Z952 Presence of prosthetic heart valve: Secondary | ICD-10-CM

## 2018-09-26 DIAGNOSIS — T826XXA Infection and inflammatory reaction due to cardiac valve prosthesis, initial encounter: Secondary | ICD-10-CM | POA: Diagnosis present

## 2018-09-26 DIAGNOSIS — B952 Enterococcus as the cause of diseases classified elsewhere: Secondary | ICD-10-CM | POA: Diagnosis not present

## 2018-09-26 DIAGNOSIS — R32 Unspecified urinary incontinence: Secondary | ICD-10-CM | POA: Diagnosis not present

## 2018-09-26 DIAGNOSIS — N390 Urinary tract infection, site not specified: Secondary | ICD-10-CM

## 2018-09-26 DIAGNOSIS — Z9079 Acquired absence of other genital organ(s): Secondary | ICD-10-CM | POA: Diagnosis not present

## 2018-09-26 DIAGNOSIS — I2510 Atherosclerotic heart disease of native coronary artery without angina pectoris: Secondary | ICD-10-CM | POA: Diagnosis present

## 2018-09-26 DIAGNOSIS — M545 Low back pain: Secondary | ICD-10-CM | POA: Diagnosis present

## 2018-09-26 DIAGNOSIS — R338 Other retention of urine: Secondary | ICD-10-CM | POA: Diagnosis present

## 2018-09-26 DIAGNOSIS — Y831 Surgical operation with implant of artificial internal device as the cause of abnormal reaction of the patient, or of later complication, without mention of misadventure at the time of the procedure: Secondary | ICD-10-CM | POA: Diagnosis present

## 2018-09-26 DIAGNOSIS — R339 Retention of urine, unspecified: Secondary | ICD-10-CM | POA: Diagnosis not present

## 2018-09-26 DIAGNOSIS — D649 Anemia, unspecified: Secondary | ICD-10-CM

## 2018-09-26 DIAGNOSIS — Z960 Presence of urogenital implants: Secondary | ICD-10-CM

## 2018-09-26 DIAGNOSIS — M4646 Discitis, unspecified, lumbar region: Secondary | ICD-10-CM | POA: Diagnosis present

## 2018-09-26 DIAGNOSIS — E861 Hypovolemia: Secondary | ICD-10-CM | POA: Diagnosis present

## 2018-09-26 DIAGNOSIS — M62830 Muscle spasm of back: Secondary | ICD-10-CM | POA: Diagnosis not present

## 2018-09-26 DIAGNOSIS — Z951 Presence of aortocoronary bypass graft: Secondary | ICD-10-CM

## 2018-09-26 DIAGNOSIS — I10 Essential (primary) hypertension: Secondary | ICD-10-CM

## 2018-09-26 DIAGNOSIS — R911 Solitary pulmonary nodule: Secondary | ICD-10-CM | POA: Diagnosis present

## 2018-09-26 DIAGNOSIS — E871 Hypo-osmolality and hyponatremia: Secondary | ICD-10-CM

## 2018-09-26 DIAGNOSIS — Z87891 Personal history of nicotine dependence: Secondary | ICD-10-CM

## 2018-09-26 DIAGNOSIS — E7800 Pure hypercholesterolemia, unspecified: Secondary | ICD-10-CM | POA: Diagnosis present

## 2018-09-26 DIAGNOSIS — I330 Acute and subacute infective endocarditis: Secondary | ICD-10-CM | POA: Diagnosis present

## 2018-09-26 DIAGNOSIS — E785 Hyperlipidemia, unspecified: Secondary | ICD-10-CM | POA: Diagnosis present

## 2018-09-26 DIAGNOSIS — R011 Cardiac murmur, unspecified: Secondary | ICD-10-CM

## 2018-09-26 DIAGNOSIS — I129 Hypertensive chronic kidney disease with stage 1 through stage 4 chronic kidney disease, or unspecified chronic kidney disease: Secondary | ICD-10-CM | POA: Diagnosis present

## 2018-09-26 DIAGNOSIS — N189 Chronic kidney disease, unspecified: Secondary | ICD-10-CM | POA: Diagnosis present

## 2018-09-26 DIAGNOSIS — D696 Thrombocytopenia, unspecified: Secondary | ICD-10-CM | POA: Diagnosis present

## 2018-09-26 DIAGNOSIS — N179 Acute kidney failure, unspecified: Secondary | ICD-10-CM | POA: Diagnosis present

## 2018-09-26 DIAGNOSIS — I739 Peripheral vascular disease, unspecified: Secondary | ICD-10-CM | POA: Diagnosis present

## 2018-09-26 LAB — COMPREHENSIVE METABOLIC PANEL
ALT: 34 U/L (ref 0–44)
AST: 45 U/L — ABNORMAL HIGH (ref 15–41)
Albumin: 3 g/dL — ABNORMAL LOW (ref 3.5–5.0)
Alkaline Phosphatase: 47 U/L (ref 38–126)
Anion gap: 12 (ref 5–15)
BUN: 47 mg/dL — ABNORMAL HIGH (ref 8–23)
CO2: 20 mmol/L — ABNORMAL LOW (ref 22–32)
Calcium: 8.2 mg/dL — ABNORMAL LOW (ref 8.9–10.3)
Chloride: 103 mmol/L (ref 98–111)
Creatinine, Ser: 2.5 mg/dL — ABNORMAL HIGH (ref 0.61–1.24)
GFR calc Af Amer: 26 mL/min — ABNORMAL LOW (ref >60)
GFR calc non Af Amer: 22 mL/min — ABNORMAL LOW (ref >60)
Glucose, Bld: 126 mg/dL — ABNORMAL HIGH (ref 70–99)
Potassium: 3.8 mmol/L (ref 3.5–5.1)
Sodium: 135 mmol/L (ref 135–145)
Total Bilirubin: 0.7 mg/dL (ref 0.3–1.2)
Total Protein: 5.3 g/dL — ABNORMAL LOW (ref 6.5–8.1)

## 2018-09-26 LAB — BLOOD CULTURE ID PANEL (REFLEXED)

## 2018-09-26 LAB — CBC WITH DIFFERENTIAL/PLATELET
Abs Immature Granulocytes: 0.05 10*3/uL (ref 0.00–0.07)
Basophils Absolute: 0 10*3/uL (ref 0.0–0.1)
Basophils Relative: 0 %
Eosinophils Absolute: 0 10*3/uL (ref 0.0–0.5)
Eosinophils Relative: 0 %
HCT: 29.4 % — ABNORMAL LOW (ref 39.0–52.0)
Hemoglobin: 9.6 g/dL — ABNORMAL LOW (ref 13.0–17.0)
Immature Granulocytes: 1 %
Lymphocytes Relative: 11 %
Lymphs Abs: 0.8 10*3/uL (ref 0.7–4.0)
MCH: 30.2 pg (ref 26.0–34.0)
MCHC: 32.7 g/dL (ref 30.0–36.0)
MCV: 92.5 fL (ref 80.0–100.0)
Monocytes Absolute: 0.7 10*3/uL (ref 0.1–1.0)
Monocytes Relative: 11 %
Neutro Abs: 5.4 10*3/uL (ref 1.7–7.7)
Neutrophils Relative %: 77 %
Platelets: 84 10*3/uL — ABNORMAL LOW (ref 150–400)
RBC: 3.18 MIL/uL — ABNORMAL LOW (ref 4.22–5.81)
RDW: 13.2 % (ref 11.5–15.5)
WBC: 7 10*3/uL (ref 4.0–10.5)
nRBC: 0 % (ref 0.0–0.2)

## 2018-09-26 LAB — ECHOCARDIOGRAM COMPLETE
Height: 70 in
Weight: 3174.62 oz

## 2018-09-26 LAB — RETICULOCYTES
Immature Retic Fract: 13.3 % (ref 2.3–15.9)
RBC.: 3.18 MIL/uL — ABNORMAL LOW (ref 4.22–5.81)
Retic Count, Absolute: 33.1 10*3/uL (ref 19.0–186.0)
Retic Ct Pct: 1 % (ref 0.4–3.1)

## 2018-09-26 LAB — VITAMIN B12: Vitamin B-12: 249 pg/mL (ref 180–914)

## 2018-09-26 LAB — FOLATE: Folate: 58.5 ng/mL (ref >5.9)

## 2018-09-26 LAB — IRON AND TIBC
Iron: 13 ug/dL — ABNORMAL LOW (ref 45–182)
Saturation Ratios: 6 % — ABNORMAL LOW (ref 17.9–39.5)
TIBC: 206 ug/dL — ABNORMAL LOW (ref 250–450)
UIBC: 193 ug/dL

## 2018-09-26 LAB — FERRITIN: Ferritin: 762 ng/mL — ABNORMAL HIGH (ref 24–336)

## 2018-09-26 LAB — LACTATE DEHYDROGENASE: LDH: 254 U/L — ABNORMAL HIGH (ref 98–192)

## 2018-09-26 MED ORDER — CYANOCOBALAMIN 1000 MCG/ML IJ SOLN
1000.0000 ug | Freq: Every day | INTRAMUSCULAR | Status: DC
  Administered 2018-09-26 – 2018-10-02 (×6): 1000 ug via INTRAMUSCULAR
  Filled 2018-09-26 (×7): qty 1

## 2018-09-26 MED ORDER — TAMSULOSIN HCL 0.4 MG PO CAPS
0.4000 mg | ORAL_CAPSULE | Freq: Every day | ORAL | Status: DC
  Administered 2018-09-26: 0.4 mg via ORAL
  Filled 2018-09-26 (×2): qty 1

## 2018-09-26 MED ORDER — SODIUM CHLORIDE 0.9 % IV SOLN
720.0000 mg | INTRAVENOUS | Status: DC
  Administered 2018-09-26: 720 mg via INTRAVENOUS
  Filled 2018-09-26: qty 14.4

## 2018-09-26 NOTE — Progress Notes (Signed)
[facility] PROGRESS NOTE    Progress Note  Randy Chen  PPJ:[id number] DOB: 22-Mar-1928 DOA: 09/25/2018 PCP: [name], MD     Brief Narrative:   Randy Chen is an 85 y.o. male past medical history significant for aortic stenosis status post CABG x5 and 89 prostate cancer hypertension who comes into the emergency department complaining of fever fatigue decreased oral intake, decreased urine output suprapubic and lower back pain that started 4 days prior to admission and worsened last night, she has been prescribed cephalexin by her physician.  Because of her pain and symptoms she went into to urgent care which referred her to the ED  Assessment/Plan:   Sepsis due to probable acute UTI: She was started empirically on IV fluids and fluid resuscitation. Her lactic acidosis is cleared. UA is consistent with probable urinary tract infection and urine cultures and blood cultures have been sent. CT scan of the abdomen and pelvis showed no stones no pyelonephritis. He does relate that he has had a decreased stream over the last several days with dribbles at night. She has defervesced her creatinine is improving we will continue current management.  AKI (acute kidney injury) (HCC)/acute urinary retention: Secondary to decreased oral intake and ACE inhibitor use and probable urinary retention. BUN to creatinine ratio greater than 20. Her baseline creatinine is around 1.1, she started aggressively on IV fluids, her ACE inhibitor was held and her creatinine is improving slowly. Foley has been inserted with clear urine output, will start him on Flomax, as he relates he has been having decrease in his urine stream's for a weeks.  Essential hypertension: Continue metoprolol all other antihypertensive medication. Continue IV fluids her blood pressure is borderline.  Hypovolemic hyponatremia: Resolved with IV fluid hydration.  Hypercholesterolemia Continue to hold  statins.  History of atherosclerotic cardiovascular disease Continue beta-blockers and antiplatelet therapy. K is 300 resume statins.  Normocytic Anemia Anemia panel showed a TIBC of 206 saturation ratio of 6, ferritin of 700. Her ferritin is acting as an acute phase reactant and she will need further evaluation as an outpatient. There are no overt signs of overt bleeding.  Acute thrombocytopenia: No signs of bleeding check an LDH and haptoglobin. Bilirubin is less than 1. Question due to sepsis.  Incidental pulmonary nodule Will need to have a repeated CT scan in 3 months as an outpatient. To monitor pulmonary nodule seen on CT scan done on 09/26/2018  B12 deficiency: Start intramuscular B12.   DVT prophylaxis: lovenox Family Communication:none Disposition Plan/Barrier to D/C: once sepsis improved Code Status:     Code Status Orders  (From admission, onward)         Start     Ordered   09/25/18 1710  Full code  Continuous     09/25/18 1712        Code Status History    Date Active Date Inactive Code Status Order ID Comments User Context   01/10/2015 0947 01/11/2015 2222 Full Code [id number]  [name], MD Inpatient   01/08/2015 2131 01/10/2015 0947 Full Code [id number]  Lavina Hamman, MD Inpatient   06/23/2012 2319 06/25/2012 0055 DNR [id number]  Jaynie Crumble, RN Inpatient        IV Access:    Peripheral IV   Procedures and diagnostic studies:   Ct Renal Stone Study  Result Date: 09/25/2018 CLINICAL DATA:  Lower back pain for 2 days EXAM: CT ABDOMEN AND PELVIS WITHOUT CONTRAST TECHNIQUE: Multidetector CT imaging of  the abdomen and pelvis was performed following the standard protocol without IV contrast. COMPARISON:  06/24/2012 FINDINGS: Lower chest: 11 mm mildly lobulated nodule is noted increased from prior exam at which time it measured 9 mm. Given the slow growth this is likely benign in etiology although follow-up is recommended as described below.  Hepatobiliary: No focal liver abnormality is seen. No gallstones, gallbladder wall thickening, or biliary dilatation. Pancreas: Unremarkable. No pancreatic ductal dilatation or surrounding inflammatory changes. Spleen: Normal in size without focal abnormality. Adrenals/Urinary Tract: Adrenal glands are within normal limits. The kidneys are well visualized bilaterally without evidence of renal calculi or obstructive change. Bladder is well distended. Stomach/Bowel: The appendix is within normal limits. Diverticular change of the colon is noted without evidence of diverticulitis. Small bowel and stomach are within normal limits with the exception of a sliding-type hiatal hernia. Vascular/Lymphatic: Aortic atherosclerosis. No enlarged abdominal or pelvic lymph nodes. Reproductive: Prostate is been surgically removed. Other: No abdominal wall hernia or abnormality. No abdominopelvic ascites. Musculoskeletal: Degenerative changes of lumbar spine are noted. IMPRESSION: Diverticulosis without diverticulitis. Sliding-type hiatal hernia. Lobulated right lower lobe nodule increased 2 mm over 6 years. This is likely benign in etiology. Repeat chest CT in 3 months is recommended to assess for stability. Electronically Signed   By: Inez Catalina M.D.   On: 09/25/2018 15:39     Medical Consultants:    None.  Anti-Infectives:   IV Rocephin  Subjective:    Randy Chen lites abdominal spasms when he moves.  Objective:    Vitals:   09/25/18 2059 09/26/18 0129 09/26/18 0507 09/26/18 0614  BP: (!) 104/58  (!) 118/58   Pulse: 82  (!) 102   Resp: 16  18   Temp: (!) 101.7 F (38.7 C) 98.8 F (37.1 C) 99.2 F (37.3 C)   TempSrc: Oral Oral Oral   SpO2: 93%  91%   Weight:    90 kg  Height:    5\' 10"  (1.778 m)    Intake/Output Summary (Last 24 hours) at 09/26/2018 0808 Last data filed at 09/26/2018 0617 Gross per 24 hour  Intake 3018 ml  Output 1200 ml  Net 1818 ml   Filed Weights   09/26/18 0614   Weight: 90 kg    Exam: General exam: In no acute distress. Respiratory system: Good air movement and clear to auscultation. Cardiovascular system: S1 & S2 heard, RRR.  Gastrointestinal system: Abdomen is nondistended, soft and nontender.  Central nervous system: Alert and oriented. No focal neurological deficits. Extremities: No pedal edema. Skin: No rashes, lesions or ulcers, no petechial rashes. Psychiatry: Judgement and insight appear normal. Mood & affect appropriate.    Data Reviewed:    Labs: Basic Metabolic Panel: Recent Labs  Lab 09/25/18 1359 09/25/18 2016 09/26/18 0535  NA 132* 134* 135  K 4.6 3.7 3.8  CL 97* 102 103  CO2 20* 21* 20*  GLUCOSE 140* 151* 126*  BUN 59* 54* 47*  CREATININE 3.53* 2.89* 2.50*  CALCIUM 9.0 8.2* 8.2*   GFR Estimated Creatinine Clearance: 23.1 mL/min (A) (by C-G formula based on SCr of 2.5 mg/dL (H)). Liver Function Tests: Recent Labs  Lab 09/25/18 1359 09/26/18 0535  AST 61* 45*  ALT 42 34  ALKPHOS 53 47  BILITOT 1.0 0.7  PROT 6.4* 5.3*  ALBUMIN 3.5 3.0*   Recent Labs  Lab 09/25/18 1359  LIPASE 35   No results for input(s): AMMONIA in the last 168 hours. Coagulation profile No results  for input(s): INR, PROTIME in the last 168 hours.  CBC: Recent Labs  Lab 09/25/18 1359 09/26/18 0535  WBC 9.2 7.0  NEUTROABS  --  5.4  HGB 10.9* 9.6*  HCT 33.5* 29.4*  MCV 93.1 92.5  PLT 88* 84*   Cardiac Enzymes: Recent Labs  Lab 09/25/18 2016  CKTOTAL 333   BNP (last 3 results) No results for input(s): PROBNP in the last 8760 hours. CBG: No results for input(s): GLUCAP in the last 168 hours. D-Dimer: No results for input(s): DDIMER in the last 72 hours. Hgb A1c: No results for input(s): HGBA1C in the last 72 hours. Lipid Profile: No results for input(s): CHOL, HDL, LDLCALC, TRIG, CHOLHDL, LDLDIRECT in the last 72 hours. Thyroid function studies: No results for input(s): TSH, T4TOTAL, T3FREE, THYROIDAB in the last  72 hours.  Invalid input(s): FREET3 Anemia work up: Recent Labs    09/26/18 0535  VITAMINB12 249  FERRITIN 762*  TIBC 206*  IRON 13*  RETICCTPCT 1.0   Sepsis Labs: Recent Labs  Lab 09/25/18 1359 09/25/18 2016 09/26/18 0535  WBC 9.2  --  7.0  LATICACIDVEN 2.2* 1.2  --    Microbiology Recent Results (from the past 240 hour(s))  Culture, blood (routine x 2)     Status: None (Preliminary result)   Collection Time: 09/25/18  8:15 PM  Result Value Ref Range Status   Specimen Description BLOOD LEFT FOREARM  Final   Special Requests   Final    BOTTLES DRAWN AEROBIC AND ANAEROBIC Blood Culture adequate volume   Culture   Final    NO GROWTH < 12 HOURS Performed at [facility], [address], [place], [place] [address]    Report Status PENDING  Incomplete  Culture, blood (routine x 2)     Status: None (Preliminary result)   Collection Time: 09/25/18  8:20 PM  Result Value Ref Range Status   Specimen Description BLOOD LEFT ANTECUBITAL  Final   Special Requests   Final    BOTTLES DRAWN AEROBIC AND ANAEROBIC Blood Culture results may not be optimal due to an inadequate volume of blood received in culture bottles   Culture   Final    NO GROWTH < 12 HOURS Performed at [facility], [place] [address], [place], [place] [address]    Report Status PENDING  Incomplete     Medications:    amLODipine  5 mg Oral Daily   aspirin EC  81 mg Oral Daily   metoprolol succinate  50 mg Oral Daily   Continuous Infusions:  sodium chloride 100 mL/hr at 09/25/18 2130   cefTRIAXone (ROCEPHIN)  IV 1 g (09/25/18 2130)      LOS: 0 days   Charlynne Cousins  [facility]  09/26/2018, 8:08 AM

## 2018-09-26 NOTE — Progress Notes (Signed)
PHARMACY - PHYSICIAN COMMUNICATION CRITICAL VALUE ALERT - BLOOD CULTURE IDENTIFICATION (BCID)  Randy Chen is an 85 y.o. male who presented to [facility] on 09/25/2018 with a chief complaint of fever, fatigue, decreased PO intake, decreased urine output, suprapubic and lower back pain x4 days. Treated for sepsis 2/2 UTI started on CTX  Assessment:  WBC 7, LA 1.2, afebrile now. BCID 4 of 4 bottles growing vanc sensitive enterococcus.   Current antibiotics: CTX  Changes to prescribed antibiotics recommended:  ID automatically consulted  Results for orders placed or performed during the hospital encounter of 09/25/18  Blood Culture ID Panel (Reflexed) (Collected: 09/25/2018  8:15 PM)  Result Value Ref Range   Enterococcus species DETECTED (A) NOT DETECTED   Vancomycin resistance NOT DETECTED NOT DETECTED   Listeria monocytogenes NOT DETECTED NOT DETECTED   Staphylococcus species NOT DETECTED NOT DETECTED   Staphylococcus aureus (BCID) NOT DETECTED NOT DETECTED   Streptococcus species NOT DETECTED NOT DETECTED   Streptococcus agalactiae NOT DETECTED NOT DETECTED   Streptococcus pneumoniae NOT DETECTED NOT DETECTED   Streptococcus pyogenes NOT DETECTED NOT DETECTED   Acinetobacter baumannii NOT DETECTED NOT DETECTED   Enterobacteriaceae species NOT DETECTED NOT DETECTED   Enterobacter cloacae complex NOT DETECTED NOT DETECTED   Escherichia coli NOT DETECTED NOT DETECTED   Klebsiella oxytoca NOT DETECTED NOT DETECTED   Klebsiella pneumoniae NOT DETECTED NOT DETECTED   Proteus species NOT DETECTED NOT DETECTED   Serratia marcescens NOT DETECTED NOT DETECTED   Haemophilus influenzae NOT DETECTED NOT DETECTED   Neisseria meningitidis NOT DETECTED NOT DETECTED   Pseudomonas aeruginosa NOT DETECTED NOT DETECTED   Candida albicans NOT DETECTED NOT DETECTED   Candida glabrata NOT DETECTED NOT DETECTED   Candida krusei NOT DETECTED NOT DETECTED   Candida parapsilosis NOT DETECTED NOT  DETECTED   Candida tropicalis NOT DETECTED NOT DETECTED    Juanell Fairly, PharmD PGY1 Pharmacy Resident 09/26/2018  12:08 PM

## 2018-09-26 NOTE — Progress Notes (Signed)
Pharmacy Antibiotic Note  Randy Chen is a 85 y.o. male admitted on 09/25/2018 with enterococcus bacteremia with possible urinary source. Pharmacy has been consulted for daptomycin dosing. WBC WNL and Tmax-101.7. Patient currently with AKI - SCr 2.5 (Baseline around 1.3).   Plan: Daptomycin  720 mg (~ 8 mg/kg) every 48 hours Baseline CK tomorrow and weekly on Fridays  Monitor susceptibilities and ability to narrow   Height: 5\' 10"  (177.8 cm) Weight: 198 lb 6.6 oz (90 kg) IBW/kg (Calculated) : 73  Temp (24hrs), Avg:99.5 F (37.5 C), Min:98.1 F (36.7 C), Max:101.7 F (38.7 C)  Recent Labs  Lab 09/25/18 1359 09/25/18 2016 09/26/18 0535  WBC 9.2  --  7.0  CREATININE 3.53* 2.89* 2.50*  LATICACIDVEN 2.2* 1.2  --     Estimated Creatinine Clearance: 23.1 mL/min (A) (by C-G formula based on SCr of 2.5 mg/dL (H)).    No Known Allergies  Antimicrobials this admission: 4/8 Ceftriaxone >>4/9 4/9 Dapto >>   Microbiology results:  4/8 BCx: 4/4 enterococcus  4/8  UCx: in process    Thank you for allowing pharmacy to be a part of this patient's care.  Jimmy Footman, PharmD, BCPS, BCIDP Infectious Diseases Clinical Pharmacist Phone: [phone] 09/26/2018 12:44 PM

## 2018-09-26 NOTE — Consult Note (Signed)
[facility] Infect[facility]    Date of Admission:  09/25/2018              Reason for Consult: enterococcus bacteremia     Referring Provider: Dr. Bess Harvest  Primary Care Provider: Dr. [name]   Assessment/Plan: Randy Chen is a 85yo male with PMH of AVR with bioprosthetic valve in 2006 for aortic stenosis,  CAD/CABGx5 in 1987, prostate CA & prostatectomy, and HTN who was admitted to the hospital yesterday with symptoms of pyelonephritis and urinary retention, which was recently treated with cephalexin. He was admitted and ceftriaxone was started. He was found to have Enterococcus bacteremia on BCID and Gram positive cocci in two blood cultures.  Exact source is unclear but symptoms are consistent pyelonephritis although renal imaging showed no acute process. He is having severe bilateral CVA pain with associated abdominal spasms, which occurred twice while I was in his room with his abdomen becoming very tight and tender. Additionally has back pain leg flexion and will order lumbar MRI with concern for diskitis.   - switch to daptomycin - will hold vanc due to AKI on top of his CKD, although this does appear to be resolving  - f/u sensitivities - TTE ordered  - MRI lumbar spine - foley placed, started on flomax per primary - f/u urinary cultures    Principal Problem:   AKI (acute kidney injury) ([facility]) Active Problems:   Hypercholesterolemia   Hypertension   History of atherosclerotic cardiovascular disease   Bladder retention   Anemia   Hyponatremia   Pulmonary nodule   Acute lower UTI   Scheduled Meds: . aspirin EC  81 mg Oral Daily  . cyanocobalamin  1,000 mcg Intramuscular Daily  . metoprolol succinate  50 mg Oral Daily  . tamsulosin  0.4 mg Oral QPC breakfast   Continuous Infusions: . sodium chloride 100 mL/hr at 09/25/18 2130   PRN Meds:.acetaminophen **OR** acetaminophen, HYDROmorphone (DILAUDID) injection, ondansetron **OR**  ondansetron (ZOFRAN) IV  HPI: Randy Chen is a 85 y.o. male who presented to the ER yesterday after one week of decreased appetite, intermittent fevers and four days of abdominal spasms associated with intense lower back pain and decreased urination to only a trickle. He states he normally does not have difficulty with urination. He has a history of prostatectomy and saw a urologist until about a year ago when he was told he would no longer have to come into the office. He denies recent burning of urination, nausea, only decreased appetite. He also has not had a bowel movement in one week and normally does not have issues with constipation.  He states the abdominal spasms come on suddenly and last several seconds. His intermittent fevers have been up to 103 sometimes but then seem to decrease on their own. He denies SOB or chest pain.    Review of Systems: ROS  ROS negative except as noted in HPI.   Past Medical History:  Diagnosis Date  . Aortic stenosis   . Coronary artery disease    CABG x5 - 1987  . History of atherosclerotic cardiovascular disease   . History of prostatectomy   . Hyperlipidemia   . Hypertension   . PAD (peripheral artery disease) ([place]) 06/06/2017    Social History   Tobacco Use  . Smoking status: Former Smoker    Last attempt to quit: 11/18/1955    Years since quitting: 62.8  . Smokeless tobacco:  Never Used  Substance Use Topics  . Alcohol use: No  . Drug use: No    Family History  Problem Relation Age of Onset  . Heart disease Father   . Heart failure Mother   . Heart attack Brother   . Congestive Heart Failure Sister   . Congestive Heart Failure Sister    No Known Allergies  OBJECTIVE: Blood pressure (!) 134/58, pulse (!) 101, temperature 99.5 F (37.5 C), temperature source Oral, resp. rate 18, height 5\' 10"  (1.778 m), weight 90 kg, SpO2 90 %.  Physical Exam Constitution: intermittent distress with abdominal pain, pleasant  HENT: [place]/AT  Eyes: no icterus or injection, eom intact Cardio: RRR, II/VI systolic murmur LUSB Respiratory: CTA, no wheezing or rales  Abdominal: hyperactive BS, with abdominal spasm abdomen becomes very taught and TTP otherwise soft and NTTP; +CVA tenderness bilaterally  MSK: no edema, moving all extremities, LE without cuts or sores GU: foley in place  Neuro: a&o, normal affect Skin: c/d/i    Lab Results Lab Results  Component Value Date   WBC 7.0 09/26/2018   HGB 9.6 (L) 09/26/2018   HCT 29.4 (L) 09/26/2018   MCV 92.5 09/26/2018   PLT 84 (L) 09/26/2018    Lab Results  Component Value Date   CREATININE 2.50 (H) 09/26/2018   BUN 47 (H) 09/26/2018   NA 135 09/26/2018   K 3.8 09/26/2018   CL 103 09/26/2018   CO2 20 (L) 09/26/2018    Lab Results  Component Value Date   ALT 34 09/26/2018   AST 45 (H) 09/26/2018   ALKPHOS 47 09/26/2018   BILITOT 0.7 09/26/2018     Microbiology: Recent Results (from the past 240 hour(s))  Culture, blood (routine x 2)     Status: None (Preliminary result)   Collection Time: 09/25/18  8:15 PM  Result Value Ref Range Status   Specimen Description BLOOD LEFT FOREARM  Final   Special Requests   Final    BOTTLES DRAWN AEROBIC AND ANAEROBIC Blood Culture adequate volume   Culture  Setup Time   Final    GRAM POSITIVE COCCI IN BOTH AEROBIC AND ANAEROBIC BOTTLES Organism ID to follow CRITICAL RESULT CALLED TO, READ BACK BY AND VERIFIED WITH: Royann Shivers PharmD 11:50 09/26/18 ([email or web address]) Performed at [facility], [address], [place], [place] [address]    Culture GRAM POSITIVE COCCI  Final   Report Status PENDING  Incomplete  Blood Culture ID Panel (Reflexed)     Status: Abnormal   Collection Time: 09/25/18  8:15 PM  Result Value Ref Range Status   Enterococcus species DETECTED (A) NOT DETECTED Final    Comment: CRITICAL RESULT CALLED TO, READ BACK BY AND VERIFIED WITH: Royann Shivers PharmD 11:50 09/26/18 ([email or web address])    Vancomycin resistance NOT  DETECTED NOT DETECTED Final   Listeria monocytogenes NOT DETECTED NOT DETECTED Final   Staphylococcus species NOT DETECTED NOT DETECTED Final   Staphylococcus aureus (BCID) NOT DETECTED NOT DETECTED Final   Streptococcus species NOT DETECTED NOT DETECTED Final   Streptococcus agalactiae NOT DETECTED NOT DETECTED Final   Streptococcus pneumoniae NOT DETECTED NOT DETECTED Final   Streptococcus pyogenes NOT DETECTED NOT DETECTED Final   Acinetobacter baumannii NOT DETECTED NOT DETECTED Final   Enterobacteriaceae species NOT DETECTED NOT DETECTED Final   Enterobacter cloacae complex NOT DETECTED NOT DETECTED Final   Escherichia coli NOT DETECTED NOT DETECTED Final   Klebsiella oxytoca NOT DETECTED NOT DETECTED Final   Klebsiella pneumoniae  NOT DETECTED NOT DETECTED Final   Proteus species NOT DETECTED NOT DETECTED Final   Serratia marcescens NOT DETECTED NOT DETECTED Final   Haemophilus influenzae NOT DETECTED NOT DETECTED Final   Neisseria meningitidis NOT DETECTED NOT DETECTED Final   Pseudomonas aeruginosa NOT DETECTED NOT DETECTED Final   Candida albicans NOT DETECTED NOT DETECTED Final   Candida glabrata NOT DETECTED NOT DETECTED Final   Candida krusei NOT DETECTED NOT DETECTED Final   Candida parapsilosis NOT DETECTED NOT DETECTED Final   Candida tropicalis NOT DETECTED NOT DETECTED Final    Comment: Performed at [facility], [place] [address], [place], [place] [address]  Culture, blood (routine x 2)     Status: None (Preliminary result)   Collection Time: 09/25/18  8:20 PM  Result Value Ref Range Status   Specimen Description BLOOD LEFT ANTECUBITAL  Final   Special Requests   Final    BOTTLES DRAWN AEROBIC AND ANAEROBIC Blood Culture results may not be optimal due to an inadequate volume of blood received in culture bottles   Culture  Setup Time   Final    GRAM POSITIVE COCCI IN BOTH AEROBIC AND ANAEROBIC BOTTLES CRITICAL VALUE NOTED.  VALUE IS CONSISTENT WITH PREVIOUSLY  REPORTED AND CALLED VALUE. Performed at [facility], [place] [address] [place], [place] [address]    Culture [facility] POSITIVE COCCI  Final   Report Status PENDING  Incomplete    Molli Hazard A, DO 09/26/2018, 12:13 PM

## 2018-09-26 NOTE — Progress Notes (Signed)
  Echocardiogram 2D Echocardiogram has been performed.  Randy Chen 09/26/2018, 2:38 PM

## 2018-09-27 ENCOUNTER — Inpatient Hospital Stay (HOSPITAL_COMMUNITY): Payer: Medicare Other

## 2018-09-27 DIAGNOSIS — R109 Unspecified abdominal pain: Secondary | ICD-10-CM

## 2018-09-27 DIAGNOSIS — R32 Unspecified urinary incontinence: Secondary | ICD-10-CM

## 2018-09-27 DIAGNOSIS — N179 Acute kidney failure, unspecified: Secondary | ICD-10-CM

## 2018-09-27 DIAGNOSIS — K5900 Constipation, unspecified: Secondary | ICD-10-CM

## 2018-09-27 DIAGNOSIS — M48061 Spinal stenosis, lumbar region without neurogenic claudication: Secondary | ICD-10-CM

## 2018-09-27 LAB — CBC WITH DIFFERENTIAL/PLATELET
Abs Immature Granulocytes: 0.05 10*3/uL (ref 0.00–0.07)
Basophils Absolute: 0 10*3/uL (ref 0.0–0.1)
Basophils Relative: 0 %
Eosinophils Absolute: 0 10*3/uL (ref 0.0–0.5)
Eosinophils Relative: 0 %
HCT: 30.1 % — ABNORMAL LOW (ref 39.0–52.0)
Hemoglobin: 10.2 g/dL — ABNORMAL LOW (ref 13.0–17.0)
Immature Granulocytes: 1 %
Lymphocytes Relative: 11 %
Lymphs Abs: 1 10*3/uL (ref 0.7–4.0)
MCH: 31.2 pg (ref 26.0–34.0)
MCHC: 33.9 g/dL (ref 30.0–36.0)
MCV: 92 fL (ref 80.0–100.0)
Monocytes Absolute: 1.1 10*3/uL — ABNORMAL HIGH (ref 0.1–1.0)
Monocytes Relative: 13 %
Neutro Abs: 6.5 10*3/uL (ref 1.7–7.7)
Neutrophils Relative %: 75 %
Platelets: 87 10*3/uL — ABNORMAL LOW (ref 150–400)
RBC: 3.27 MIL/uL — ABNORMAL LOW (ref 4.22–5.81)
RDW: 13.2 % (ref 11.5–15.5)
WBC: 8.6 10*3/uL (ref 4.0–10.5)
nRBC: 0 % (ref 0.0–0.2)

## 2018-09-27 LAB — URINE CULTURE: Culture: 10000 — AB

## 2018-09-27 LAB — HAPTOGLOBIN: Haptoglobin: 247 mg/dL (ref 38–329)

## 2018-09-27 LAB — BASIC METABOLIC PANEL
Anion gap: 11 (ref 5–15)
BUN: 37 mg/dL — ABNORMAL HIGH (ref 8–23)
CO2: 21 mmol/L — ABNORMAL LOW (ref 22–32)
Calcium: 8.3 mg/dL — ABNORMAL LOW (ref 8.9–10.3)
Chloride: 104 mmol/L (ref 98–111)
Creatinine, Ser: 2.15 mg/dL — ABNORMAL HIGH (ref 0.61–1.24)
GFR calc Af Amer: 31 mL/min — ABNORMAL LOW (ref >60)
GFR calc non Af Amer: 27 mL/min — ABNORMAL LOW (ref >60)
Glucose, Bld: 131 mg/dL — ABNORMAL HIGH (ref 70–99)
Potassium: 3.8 mmol/L (ref 3.5–5.1)
Sodium: 136 mmol/L (ref 135–145)

## 2018-09-27 LAB — CK: Total CK: 90 U/L (ref 49–397)

## 2018-09-27 MED ORDER — CYCLOBENZAPRINE HCL 5 MG PO TABS
5.0000 mg | ORAL_TABLET | Freq: Two times a day (BID) | ORAL | Status: DC
  Administered 2018-09-27 – 2018-10-02 (×11): 5 mg via ORAL
  Filled 2018-09-27 (×11): qty 1

## 2018-09-27 MED ORDER — SODIUM CHLORIDE 0.9 % IV SOLN
2.0000 g | Freq: Three times a day (TID) | INTRAVENOUS | Status: DC
  Administered 2018-09-27 – 2018-10-02 (×15): 2 g via INTRAVENOUS
  Filled 2018-09-27 (×18): qty 2000

## 2018-09-27 MED ORDER — SODIUM CHLORIDE 0.9 % IV SOLN
INTRAVENOUS | Status: DC
  Administered 2018-09-27 – 2018-09-28 (×2): via INTRAVENOUS

## 2018-09-27 MED ORDER — POLYETHYLENE GLYCOL 3350 17 G PO PACK
17.0000 g | PACK | Freq: Two times a day (BID) | ORAL | Status: AC
  Administered 2018-09-27 – 2018-09-28 (×4): 17 g via ORAL
  Filled 2018-09-27 (×4): qty 1

## 2018-09-27 MED ORDER — SODIUM CHLORIDE 0.9 % IV SOLN
2.0000 g | Freq: Two times a day (BID) | INTRAVENOUS | Status: DC
  Administered 2018-09-27 – 2018-10-02 (×10): 2 g via INTRAVENOUS
  Filled 2018-09-27: qty 20
  Filled 2018-09-27: qty 2
  Filled 2018-09-27 (×10): qty 20

## 2018-09-27 MED ORDER — DOXAZOSIN MESYLATE 2 MG PO TABS: 1.0000 mg | ORAL_TABLET | Freq: Every day | ORAL | Status: DC

## 2018-09-27 NOTE — Progress Notes (Signed)
ID Pharmacy Note   Blood cultures now showing Enterococcus faecalis. Spoke with ID team and will narrow to ampicillin + ceftriaxone to cover for possible enterococcal endocarditis.   -  Stop Daptomycin -  Start ampicillin 2 gm Q 8 hours -  Start ceftriaxone 2 gm Q 12 hours    Jimmy Footman, PharmD, BCPS, BCIDP Infectious Diseases Clinical Pharmacist Phone: [phone]

## 2018-09-27 NOTE — Evaluation (Signed)
Physical Therapy Evaluation Patient Details Name: Randy Chen MRN: [id number] DOB: [birth year] Today's Date: 09/27/2018   History of Present Illness  Randy Chen is an 85 y.o. male past medical history significant for aortic stenosis status post CABG x5 and 89 prostate cancer hypertension who comes into the emergency department complaining of fever fatigue decreased oral intake, decreased urine output suprapubic and lower back pain that started 4 days prior to admission and worsened last night, she has been prescribed cephalexin by her physician.  Because of her pain and symptoms she went into to urgent care which referred her to the ED    Clinical Impression  Pt admitted with above diagnosis. Pt currently with functional limitations due to the deficits listed below (see PT Problem List). PTA pt living at [place] with his wife, reports independence with mobility and ADLs. Today, pt limited to bed level eval due to pain in his back and stomach which he says can become 10/10, since last Saturday. Given bed level therex to mitigate deconditioning.  Pt will benefit from skilled PT to increase their independence and safety with mobility to allow discharge to the venue listed below.      Follow Up Recommendations SNF    Equipment Recommendations  (TBD)    Recommendations for Other Services       Precautions / Restrictions Precautions Precautions: Fall Restrictions Weight Bearing Restrictions: No      Mobility  Bed Mobility Overal bed mobility: Needs Assistance             General bed mobility comments: limited by pain today. unable to rise. refusing.   Transfers                    Ambulation/Gait                Stairs            Wheelchair Mobility    Modified Rankin (Stroke Patients Only)       Balance                                             Pertinent Vitals/Pain Pain Assessment: 0-10 Faces Pain Scale: Hurts  whole lot Pain Location: back and stomach  Pain Descriptors / Indicators: Aching;Discomfort;Grimacing Pain Intervention(s): Limited activity within patient's tolerance    Home Living Family/patient expects to be discharged to:: Private residence Living Arrangements: Spouse/significant other Available Help at Discharge: Available 24 hours/day Type of Home: House Home Access: Level entry     Home Layout: One level Home Equipment: None      Prior Function Level of Independence: Independent         Comments: lives in [place] with his wife, I with mobility and ADLs     Hand Dominance        Extremity/Trunk Assessment   Upper Extremity Assessment Upper Extremity Assessment: Generalized weakness    Lower Extremity Assessment Lower Extremity Assessment: Generalized weakness       Communication   Communication: No difficulties  Cognition Arousal/Alertness: Awake/alert Behavior During Therapy: WFL for tasks assessed/performed Overall Cognitive Status: Within Functional Limits for tasks assessed  General Comments      Exercises General Exercises - Lower Extremity Ankle Circles/Pumps: 20 reps Quad Sets: 20 reps Heel Slides: 20 reps Hip ABduction/ADduction: 20 reps   Assessment/Plan    PT Assessment Patient needs continued PT services  PT Problem List Decreased strength       PT Treatment Interventions DME instruction;Gait training;Functional mobility training;Stair training;Therapeutic activities;Therapeutic exercise    PT Goals (Current goals can be found in the Care Plan section)  Acute Rehab PT Goals Patient Stated Goal: go to SNF side @ [facility] PT Goal Formulation: With patient Time For Goal Achievement: 10/11/18 Potential to Achieve Goals: Fair    Frequency Min 3X/week   Barriers to discharge        Co-evaluation               AM-PAC PT "6 Clicks" Mobility  Outcome Measure  Help needed turning from your back to your side while in a flat bed without using bedrails?: None Help needed moving from lying on your back to sitting on the side of a flat bed without using bedrails?: A Little Help needed moving to and from a bed to a chair (including a wheelchair)?: A Little Help needed standing up from a chair using your arms (e.g., wheelchair or bedside chair)?: A Lot Help needed to walk in hospital room?: Total Help needed climbing 3-5 steps with a railing? : Total 6 Click Score: 14    End of Session Equipment Utilized During Treatment: Gait belt Activity Tolerance: Patient tolerated treatment well Patient left: in bed Nurse Communication: Mobility status PT Visit Diagnosis: Unsteadiness on feet (R26.81)    Time: 1345-1410 PT Time Calculation (min) (ACUTE ONLY): 25 min   Charges:   PT Evaluation $PT Eval Low Complexity: 1 Low PT Treatments $Therapeutic Exercise: 8-22 mins       Reinaldo Berber, PT, DPT Acute Rehabilitation Services Pager: [phone] Office: [place] 09/27/2018, 2:19 PM

## 2018-09-27 NOTE — Progress Notes (Signed)
[facility] for [facility]ious [facility]  Date of Admission:  09/25/2018   Total days of antibiotics 2         ASSESSMENT/PLAN: 85yo male with PMH of AVR with bioprosthetic valve in 2006 for aortic stenosis, CAD/CABGx5 in 1987, prostate cancer s/p prostatectomy, and HTN who presented with urinary incontinence, AKI and lower back and abdominal pain found to have Enterococcus faecalis bacteremia. Previously started on cephalexin in the outpatient setting for probable UTI. His urine is positive for Gram+ bacteria and is the likely source for his bacteremia. Renal CT, however did not show pyelo and MRI of the lumbar spine only shows moderate stenosis. The exact etiology of his radiating backpain to his abdomen remains unclear.  He still has not had a BM since Saturday although CT did not show increased stool burden, and he has had decreased appetite and has not been eating as much.   - repeat blood cultures x2 - narrow daptomycin to ampicillin for Enterococcus faecalis coverage  - will add ceftriaxone for endocarditis coverage - TTE negative but with limited view of valves and as he has bioprosthetic valve, he is at higher risk and should have coverage - will need six weeks IV antibiotic therapy - he resides at [facility] where he can be helped with administration of medications - f/u urine culture  - constipation - will defer to primary team   Principal Problem:   AKI (acute kidney injury) ([place]) Active Problems:   Hypercholesterolemia   S/P AVR   Hypertension   History of atherosclerotic cardiovascular disease   Bladder retention   Anemia   Hyponatremia   Pulmonary nodule   Acute lower UTI   Enterococcal bacteremia   Scheduled Meds: . aspirin EC  81 mg Oral Daily  . cyanocobalamin  1,000 mcg Intramuscular Daily  . metoprolol succinate  50 mg Oral Daily  . tamsulosin  0.4 mg Oral QPC breakfast   Continuous Infusions: . DAPTOmycin (CUBICIN)  IV 720 mg  (09/26/18 1525)   PRN Meds:.acetaminophen **OR** acetaminophen, HYDROmorphone (DILAUDID) injection, ondansetron **OR** ondansetron (ZOFRAN) IV   SUBJECTIVE: Continues to have lower back pain and intermittent abdominal spasms that have not decreased. He describes the pain as electric. He states he has little appetite but was able to eat and drink a little this morning. He denies abdominal pain, nausea, SOB, or chest pain. He denies fever or chills. He states he has not had a BM in seven days.   Review of Systems: ROS negative except as noted in subjective.   No Known Allergies  OBJECTIVE: Vitals:   09/26/18 0943 09/26/18 1810 09/26/18 2054 09/27/18 0535  BP: (!) 134/58 115/64 133/63 133/60  Pulse: (!) 101 93 90 91  Resp: 18 (!) 22 18 18   Temp: 99.5 F (37.5 C) 99.4 F (37.4 C) 100.2 F (37.9 C) 98.3 F (36.8 C)  TempSrc: Oral Oral Oral Oral  SpO2: 90% (!) 87% (!) 89% 90%  Weight:      Height:       Body mass index is 28.47 kg/m.  Physical Exam Constitution: NAD, supine in bed  HENT: [place]/AT Eyes: no icterus or injection  Cardio: RRR, no m/r/g  Respiratory: non-labored breathing  Abdominal: +BS, NTTP, non-distended MSK: moving all extremities Neuro: a&o, normal affect, pleasant  Skin: c/d/i   Lab Results Lab Results  Component Value Date   WBC 8.6 09/27/2018   HGB 10.2 (L) 09/27/2018   HCT 30.1 (  L) 09/27/2018   MCV 92.0 09/27/2018   PLT 87 (L) 09/27/2018    Lab Results  Component Value Date   CREATININE 2.15 (H) 09/27/2018   BUN 37 (H) 09/27/2018   NA 136 09/27/2018   K 3.8 09/27/2018   CL 104 09/27/2018   CO2 21 (L) 09/27/2018    Lab Results  Component Value Date   ALT 34 09/26/2018   AST 45 (H) 09/26/2018   ALKPHOS 47 09/26/2018   BILITOT 0.7 09/26/2018     Microbiology: Recent Results (from the past 240 hour(s))  Culture, Urine     Status: Abnormal (Preliminary result)   Collection Time: 09/25/18  7:51 PM  Result Value Ref Range Status    Specimen Description URINE, RANDOM  Final   Special Requests NONE  Final   Culture (A)  Final    10,000 COLONIES/mL GRAM POSITIVE COCCI IDENTIFICATION AND SUSCEPTIBILITIES TO FOLLOW Performed at [facility], [address] [place], [place] [address]    Report Status PENDING  Incomplete  Culture, blood (routine x 2)     Status: None (Preliminary result)   Collection Time: 09/25/18  8:15 PM  Result Value Ref Range Status   Specimen Description BLOOD LEFT FOREARM  Final   Special Requests   Final    BOTTLES DRAWN AEROBIC AND ANAEROBIC Blood Culture adequate volume   Culture  Setup Time   Final    GRAM POSITIVE COCCI IN BOTH AEROBIC AND ANAEROBIC BOTTLES Organism ID to follow CRITICAL RESULT CALLED TO, READ BACK BY AND VERIFIED WITH: Royann Shivers PharmD 11:50 09/26/18 ([email or web address]) Performed at [facility], [place] [address] [place], [place] [address]    Culture GRAM POSITIVE COCCI  Final   Report Status PENDING  Incomplete  Blood Culture ID Panel (Reflexed)     Status: Abnormal   Collection Time: 09/25/18  8:15 PM  Result Value Ref Range Status   Enterococcus species DETECTED (A) NOT DETECTED Final    Comment: CRITICAL RESULT CALLED TO, READ BACK BY AND VERIFIED WITH: Royann Shivers PharmD 11:50 09/26/18 ([email or web address])    Vancomycin resistance NOT DETECTED NOT DETECTED Final   Listeria monocytogenes NOT DETECTED NOT DETECTED Final   Staphylococcus species NOT DETECTED NOT DETECTED Final   Staphylococcus aureus (BCID) NOT DETECTED NOT DETECTED Final   Streptococcus species NOT DETECTED NOT DETECTED Final   Streptococcus agalactiae NOT DETECTED NOT DETECTED Final   Streptococcus pneumoniae NOT DETECTED NOT DETECTED Final   Streptococcus pyogenes NOT DETECTED NOT DETECTED Final   Acinetobacter baumannii NOT DETECTED NOT DETECTED Final   Enterobacteriaceae species NOT DETECTED NOT DETECTED Final   Enterobacter cloacae complex NOT DETECTED NOT DETECTED Final   Escherichia coli NOT  DETECTED NOT DETECTED Final   Klebsiella oxytoca NOT DETECTED NOT DETECTED Final   Klebsiella pneumoniae NOT DETECTED NOT DETECTED Final   Proteus species NOT DETECTED NOT DETECTED Final   Serratia marcescens NOT DETECTED NOT DETECTED Final   Haemophilus influenzae NOT DETECTED NOT DETECTED Final   Neisseria meningitidis NOT DETECTED NOT DETECTED Final   Pseudomonas aeruginosa NOT DETECTED NOT DETECTED Final   Candida albicans NOT DETECTED NOT DETECTED Final   Candida glabrata NOT DETECTED NOT DETECTED Final   Candida krusei NOT DETECTED NOT DETECTED Final   Candida parapsilosis NOT DETECTED NOT DETECTED Final   Candida tropicalis NOT DETECTED NOT DETECTED Final    Comment: Performed at [facility], [address], [place], [place] [address]  Culture, blood (routine  x 2)     Status: None (Preliminary result)   Collection Time: 09/25/18  8:20 PM  Result Value Ref Range Status   Specimen Description BLOOD LEFT ANTECUBITAL  Final   Special Requests   Final    BOTTLES DRAWN AEROBIC AND ANAEROBIC Blood Culture results may not be optimal due to an inadequate volume of blood received in culture bottles   Culture  Setup Time   Final    GRAM POSITIVE COCCI IN BOTH AEROBIC AND ANAEROBIC BOTTLES CRITICAL VALUE NOTED.  VALUE IS CONSISTENT WITH PREVIOUSLY REPORTED AND CALLED VALUE. Performed at [facility], [place] [address] [place], [place] [address]    Culture [facility] POSITIVE COCCI  Final   Report Status PENDING  Incomplete    Molli Hazard A, DO 09/27/2018, 8:29 AM

## 2018-09-27 NOTE — Progress Notes (Signed)
[facility] PROGRESS NOTE    Progress Note  Randy Chen  XNT:[id number] DOB: 1927/09/11 DOA: 09/25/2018 PCP: [name], MD     Brief Narrative:   Randy Chen is an 85 y.o. male past medical history significant for aortic stenosis status post CABG x5 and 89 prostate cancer hypertension who comes into the emergency department complaining of fever fatigue decreased oral intake, decreased urine output suprapubic and lower back pain that started 4 days prior to admission and worsened last night, she has been prescribed cephalexin by her physician.  Because of her pain and symptoms she went into to urgent care which referred her to the ED  Assessment/Plan:   Sepsis due to probable acute UTI: He was started empirically on IV Abx and fluid resuscitation. His lactic acidosis is cleared. UA is consistent with probable urinary tract infection and urine cultures sensitivities are pending. Blood cultures grew enterococcus he was changed to IV daptomycin.  ID has been consulted. He is having back spasm. MRI of the lumbar spine and sacral iliac showed no discitis or osteomyelitis, moderate L4-L5 spinal canal stenosis with mild right left foraminal stenosis, S1 lateral recess narrowing with slight displacement of the descending S1 nerve root. There were no vegetations on 2D echo. Start him on Flexeril. PT eval pending  AKI (acute kidney injury) (HCC)/acute urinary retention: Secondary to decreased oral intake and ACE inhibitor use and probable urinary retention. His baseline creatinine is around 1.1. Continue IV fluids at 75 cc, repeat a basic metabolic panel in the morning.  Essential hypertension: Continue metoprolol continue to hold all other antihypertensive medication.  Hypovolemic hyponatremia: Resolved with IV fluid hydration.  Hypercholesterolemia Continue to hold statins.  History of atherosclerotic cardiovascular disease Continue beta-blockers and antiplatelet  therapy. resume statins.  Normocytic Anemia Anemia panel showed a TIBC of 206 saturation ratio of 6, ferritin of 700. Her ferritin is acting as an acute phase reactant and she will need further evaluation as an outpatient. There are no overt signs of overt bleeding.  Acute thrombocytopenia: No signs of bleeding, LDH 254, haptoglobin pending. Bilirubin is less than 1. Question due to sepsis.  Incidental pulmonary nodule Will need to have a repeated CT scan in 3 months as an outpatient. To monitor pulmonary nodule seen on CT scan done on 09/26/2018  B12 deficiency: Start intramuscular B12.   DVT prophylaxis: lovenox Family Communication:none Disposition Plan/Barrier to D/C: once sepsis improved Code Status:     Code Status Orders  (From admission, onward)         Start     Ordered   09/25/18 1710  Full code  Continuous     09/25/18 1712        Code Status History    Date Active Date Inactive Code Status Order ID Comments User Context   01/10/2015 0947 01/11/2015 2222 Full Code [id number]  [name], MD Inpatient   01/08/2015 2131 01/10/2015 0947 Full Code [id number]  Lavina Hamman, MD Inpatient   06/23/2012 2319 06/25/2012 0055 DNR [id number]  Jaynie Crumble, RN Inpatient        IV Access:    Peripheral IV   Procedures and diagnostic studies:   Mr Lumbar Spine Wo Contrast  Result Date: 09/26/2018 CLINICAL DATA:  Intracarpal sepsis. Fever with suprapubic and low back pain. EXAM: MRI LUMBAR SPINE WITHOUT CONTRAST TECHNIQUE: Multiplanar, multisequence MR imaging of the lumbar spine was performed. No intravenous contrast was administered. COMPARISON:  None. FINDINGS: Segmentation: Normal. The lowest  disc space is considered to be L5-S1. Alignment:  Normal Vertebrae: No acute compression fracture, discitis-osteomyelitis of focal marrow lesion. Conus medullaris and cauda equina: The conus medullaris terminates at the L1 level. The cauda equina and conus medullaris are both  normal. Paraspinal and other soft tissues: The visualized retroperitoneal organs and paraspinal soft tissues are normal. Disc levels: Sagittal plane imaging includes the T11-12 disc level through the upper sacrum, with axial imaging of the L1-2 to L5-S1 disc levels. L1-2: Large anterior osteophyte. No spinal canal or neural foraminal stenosis. Normal facets. L2-3: Mild disc bulge with narrowing of the lateral recesses, right greater than left. Mild bilateral neural foraminal stenosis. No central spinal canal stenosis. L3-4: Mild disc bulge with right-greater-than-left lateral recess narrowing. No central spinal canal stenosis. Mild right foraminal stenosis. L4-5: Intermediate disc bulge and moderate facet hypertrophy. Moderate spinal canal stenosis with mild right and moderate left neural foraminal stenosis. L5-S1: Small central disc extrusion with superior migration narrowing the right lateral recess and slightly displacing the right S1 nerve root. No central spinal canal stenosis. Moderate bilateral foraminal stenosis. The visualized portion of the sacrum is normal. IMPRESSION: 1. No acute abnormality or evidence for discitis-osteomyelitis. 2. Moderate L4-5 spinal canal stenosis with mild right, moderate left neural foraminal stenosis. 3. L5-S1 right lateral recess narrowing with slight displacement of the descending right S1 nerve root. Moderate bilateral foraminal stenosis. Electronically Signed   By: Ulyses Jarred M.D.   On: 09/26/2018 19:09   Mr Sacrum Si Joints Wo Contrast  Result Date: 09/27/2018 CLINICAL DATA:  Fever, sepsis, suprapubic and low back pain. EXAM: MRI PELVIS WITHOUT CONTRAST (sacrum protocol) TECHNIQUE: Multiplanar multisequence MR imaging of the pelvis was performed. No intravenous contrast was administered. COMPARISON:  MRI lumbar spine 09/26/2018; CT pelvis 09/25/2018 FINDINGS: Osseous structures and joints: Both sacroiliac joints. Unremarkable. No evidence septic joint. Degenerative  facet arthropathy in the lower lumbar spine. No abnormal edema signal in the sacrum. Musculotendinous: No significant regional muscular edema. Soft tissue/other: Prostatectomy. Suspected trace pelvic ascites, significance uncertain. Sigmoid colon diverticulosis. No sciatic notch impingement. IMPRESSION: 1. The sacroiliac joints appear unremarkable. No sacral abnormality observed. 2. Trace pelvic ascites. 3. Prostatectomy. Electronically Signed   By: Van Clines M.D.   On: 09/27/2018 07:51   Ct Renal Stone Study  Result Date: 09/25/2018 CLINICAL DATA:  Lower back pain for 2 days EXAM: CT ABDOMEN AND PELVIS WITHOUT CONTRAST TECHNIQUE: Multidetector CT imaging of the abdomen and pelvis was performed following the standard protocol without IV contrast. COMPARISON:  06/24/2012 FINDINGS: Lower chest: 11 mm mildly lobulated nodule is noted increased from prior exam at which time it measured 9 mm. Given the slow growth this is likely benign in etiology although follow-up is recommended as described below. Hepatobiliary: No focal liver abnormality is seen. No gallstones, gallbladder wall thickening, or biliary dilatation. Pancreas: Unremarkable. No pancreatic ductal dilatation or surrounding inflammatory changes. Spleen: Normal in size without focal abnormality. Adrenals/Urinary Tract: Adrenal glands are within normal limits. The kidneys are well visualized bilaterally without evidence of renal calculi or obstructive change. Bladder is well distended. Stomach/Bowel: The appendix is within normal limits. Diverticular change of the colon is noted without evidence of diverticulitis. Small bowel and stomach are within normal limits with the exception of a sliding-type hiatal hernia. Vascular/Lymphatic: Aortic atherosclerosis. No enlarged abdominal or pelvic lymph nodes. Reproductive: Prostate is been surgically removed. Other: No abdominal wall hernia or abnormality. No abdominopelvic ascites. Musculoskeletal:  Degenerative changes of lumbar spine are noted.  IMPRESSION: Diverticulosis without diverticulitis. Sliding-type hiatal hernia. Lobulated right lower lobe nodule increased 2 mm over 6 years. This is likely benign in etiology. Repeat chest CT in 3 months is recommended to assess for stability. Electronically Signed   By: Inez Catalina M.D.   On: 09/25/2018 15:39     Medical Consultants:    None.  Anti-Infectives:   IV Rocephin  Subjective:    Randy Chen continues to have abdominal spasms when he tries to move.  Objective:    Vitals:   09/26/18 1810 09/26/18 2054 09/27/18 0535 09/27/18 0840  BP: 115/64 133/63 133/60 136/67  Pulse: 93 90 91 70  Resp: (!) 22 18 18 18   Temp: 99.4 F (37.4 C) 100.2 F (37.9 C) 98.3 F (36.8 C)   TempSrc: Oral Oral Oral   SpO2: (!) 87% (!) 89% 90% 92%  Weight:      Height:        Intake/Output Summary (Last 24 hours) at 09/27/2018 1022 Last data filed at 09/27/2018 0535 Gross per 24 hour  Intake 220 ml  Output 1600 ml  Net -1380 ml   Filed Weights   09/26/18 0614  Weight: 90 kg    Exam: General exam: In no acute distress. Respiratory system: Good air movement and clear to auscultation. Cardiovascular system: S1 & S2 heard, RRR.  Gastrointestinal system: Abdomen is nondistended, soft and nontender.  Central nervous system: Alert and oriented.  Straight leg raise B/L causes spastic pain Extremities: No pedal edema. Skin: No rashes, lesions or ulcers, no petechial rashes. Psychiatry: Judgement and insight appear normal. Mood & affect appropriate.    Data Reviewed:    Labs: Basic Metabolic Panel: Recent Labs  Lab 09/25/18 1359 09/25/18 2016 09/26/18 0535 09/27/18 0709  NA 132* 134* 135 136  K 4.6 3.7 3.8 3.8  CL 97* 102 103 104  CO2 20* 21* 20* 21*  GLUCOSE 140* 151* 126* 131*  BUN 59* 54* 47* 37*  CREATININE 3.53* 2.89* 2.50* 2.15*  CALCIUM 9.0 8.2* 8.2* 8.3*   GFR Estimated Creatinine Clearance: 26.8 mL/min  (A) (by C-G formula based on SCr of 2.15 mg/dL (H)). Liver Function Tests: Recent Labs  Lab 09/25/18 1359 09/26/18 0535  AST 61* 45*  ALT 42 34  ALKPHOS 53 47  BILITOT 1.0 0.7  PROT 6.4* 5.3*  ALBUMIN 3.5 3.0*   Recent Labs  Lab 09/25/18 1359  LIPASE 35   No results for input(s): AMMONIA in the last 168 hours. Coagulation profile No results for input(s): INR, PROTIME in the last 168 hours.  CBC: Recent Labs  Lab 09/25/18 1359 09/26/18 0535 09/27/18 0709  WBC 9.2 7.0 8.6  NEUTROABS  --  5.4 6.5  HGB 10.9* 9.6* 10.2*  HCT 33.5* 29.4* 30.1*  MCV 93.1 92.5 92.0  PLT 88* 84* 87*   Cardiac Enzymes: Recent Labs  Lab 09/25/18 2016 09/27/18 0709  CKTOTAL 333 90   BNP (last 3 results) No results for input(s): PROBNP in the last 8760 hours. CBG: No results for input(s): GLUCAP in the last 168 hours. D-Dimer: No results for input(s): DDIMER in the last 72 hours. Hgb A1c: No results for input(s): HGBA1C in the last 72 hours. Lipid Profile: No results for input(s): CHOL, HDL, LDLCALC, TRIG, CHOLHDL, LDLDIRECT in the last 72 hours. Thyroid function studies: No results for input(s): TSH, T4TOTAL, T3FREE, THYROIDAB in the last 72 hours.  Invalid input(s): FREET3 Anemia work up: [employer]    09/26/18 [place]  249  FOLATE 58.5  FERRITIN 762*  TIBC 206*  IRON 13*  RETICCTPCT 1.0   Sepsis Labs: Recent Labs  Lab 09/25/18 1359 09/25/18 2016 09/26/18 0535 09/27/18 0709  WBC 9.2  --  7.0 8.6  LATICACIDVEN 2.2* 1.2  --   --    Microbiology Recent Results (from the past 240 hour(s))  Culture, Urine     Status: Abnormal (Preliminary result)   Collection Time: 09/25/18  7:51 PM  Result Value Ref Range Status   Specimen Description URINE, RANDOM  Final   Special Requests NONE  Final   Culture (A)  Final    10,000 COLONIES/mL GRAM POSITIVE COCCI IDENTIFICATION AND SUSCEPTIBILITIES TO FOLLOW Performed at [facility], [address]  [place], [place] [address]    Report Status PENDING  Incomplete  Culture, blood (routine x 2)     Status: None (Preliminary result)   Collection Time: 09/25/18  8:15 PM  Result Value Ref Range Status   Specimen Description BLOOD LEFT FOREARM  Final   Special Requests   Final    BOTTLES DRAWN AEROBIC AND ANAEROBIC Blood Culture adequate volume   Culture  Setup Time   Final    GRAM POSITIVE COCCI IN BOTH AEROBIC AND ANAEROBIC BOTTLES Organism ID to follow CRITICAL RESULT CALLED TO, READ BACK BY AND VERIFIED WITH: Royann Shivers PharmD 11:50 09/26/18 ([email or web address]) Performed at [facility], [place] [address], [place], [place] [address]    Culture GRAM POSITIVE COCCI  Final   Report Status PENDING  Incomplete  Blood Culture ID Panel (Reflexed)     Status: Abnormal   Collection Time: 09/25/18  8:15 PM  Result Value Ref Range Status   Enterococcus species DETECTED (A) NOT DETECTED Final    Comment: CRITICAL RESULT CALLED TO, READ BACK BY AND VERIFIED WITH: Royann Shivers PharmD 11:50 09/26/18 ([email or web address])    Vancomycin resistance NOT DETECTED NOT DETECTED Final   Listeria monocytogenes NOT DETECTED NOT DETECTED Final   Staphylococcus species NOT DETECTED NOT DETECTED Final   Staphylococcus aureus (BCID) NOT DETECTED NOT DETECTED Final   Streptococcus species NOT DETECTED NOT DETECTED Final   Streptococcus agalactiae NOT DETECTED NOT DETECTED Final   Streptococcus pneumoniae NOT DETECTED NOT DETECTED Final   Streptococcus pyogenes NOT DETECTED NOT DETECTED Final   Acinetobacter baumannii NOT DETECTED NOT DETECTED Final   Enterobacteriaceae species NOT DETECTED NOT DETECTED Final   Enterobacter cloacae complex NOT DETECTED NOT DETECTED Final   Escherichia coli NOT DETECTED NOT DETECTED Final   Klebsiella oxytoca NOT DETECTED NOT DETECTED Final   Klebsiella pneumoniae NOT DETECTED NOT DETECTED Final   Proteus species NOT DETECTED NOT DETECTED Final   Serratia marcescens NOT DETECTED NOT DETECTED Final    Haemophilus influenzae NOT DETECTED NOT DETECTED Final   Neisseria meningitidis NOT DETECTED NOT DETECTED Final   Pseudomonas aeruginosa NOT DETECTED NOT DETECTED Final   Candida albicans NOT DETECTED NOT DETECTED Final   Candida glabrata NOT DETECTED NOT DETECTED Final   Candida krusei NOT DETECTED NOT DETECTED Final   Candida parapsilosis NOT DETECTED NOT DETECTED Final   Candida tropicalis NOT DETECTED NOT DETECTED Final    Comment: Performed at [facility], [address] [place], [place] [address]  Culture, blood (routine x 2)     Status: None (Preliminary result)   Collection Time: 09/25/18  8:20 PM  Result Value Ref Range Status   Specimen Description BLOOD LEFT ANTECUBITAL  Final   Special Requests  Final    BOTTLES DRAWN AEROBIC AND ANAEROBIC Blood Culture results may not be optimal due to an inadequate volume of blood received in culture bottles   Culture  Setup Time   Final    GRAM POSITIVE COCCI IN BOTH AEROBIC AND ANAEROBIC BOTTLES CRITICAL VALUE NOTED.  VALUE IS CONSISTENT WITH PREVIOUSLY REPORTED AND CALLED VALUE. Performed at [facility], [place] [address], [place], [place] [address]    Culture [facility] POSITIVE COCCI  Final   Report Status PENDING  Incomplete     Medications:    aspirin EC  81 mg Oral Daily   cyanocobalamin  1,000 mcg Intramuscular Daily   metoprolol succinate  50 mg Oral Daily   tamsulosin  0.4 mg Oral QPC breakfast   Continuous Infusions:  DAPTOmycin (CUBICIN)  IV 720 mg (09/26/18 1525)      LOS: 1 day   Randy Chen  [facility]  09/27/2018, 10:22 AM

## 2018-09-28 LAB — CULTURE, BLOOD (ROUTINE X 2): Special Requests: ADEQUATE

## 2018-09-28 LAB — BASIC METABOLIC PANEL
Anion gap: 12 (ref 5–15)
BUN: 30 mg/dL — ABNORMAL HIGH (ref 8–23)
CO2: 21 mmol/L — ABNORMAL LOW (ref 22–32)
Calcium: 8.2 mg/dL — ABNORMAL LOW (ref 8.9–10.3)
Chloride: 102 mmol/L (ref 98–111)
Creatinine, Ser: 1.91 mg/dL — ABNORMAL HIGH (ref 0.61–1.24)
GFR calc Af Amer: 35 mL/min — ABNORMAL LOW (ref >60)
GFR calc non Af Amer: 31 mL/min — ABNORMAL LOW (ref >60)
Glucose, Bld: 134 mg/dL — ABNORMAL HIGH (ref 70–99)
Potassium: 3.9 mmol/L (ref 3.5–5.1)
Sodium: 135 mmol/L (ref 135–145)

## 2018-09-28 LAB — CBC WITH DIFFERENTIAL/PLATELET
Abs Immature Granulocytes: 0.07 10*3/uL (ref 0.00–0.07)
Basophils Absolute: 0 10*3/uL (ref 0.0–0.1)
Basophils Relative: 0 %
Eosinophils Absolute: 0 10*3/uL (ref 0.0–0.5)
Eosinophils Relative: 0 %
HCT: 27.4 % — ABNORMAL LOW (ref 39.0–52.0)
Hemoglobin: 9.4 g/dL — ABNORMAL LOW (ref 13.0–17.0)
Immature Granulocytes: 1 %
Lymphocytes Relative: 11 %
Lymphs Abs: 1.2 10*3/uL (ref 0.7–4.0)
MCH: 31 pg (ref 26.0–34.0)
MCHC: 34.3 g/dL (ref 30.0–36.0)
MCV: 90.4 fL (ref 80.0–100.0)
Monocytes Absolute: 1.1 10*3/uL — ABNORMAL HIGH (ref 0.1–1.0)
Monocytes Relative: 11 %
Neutro Abs: 8 10*3/uL — ABNORMAL HIGH (ref 1.7–7.7)
Neutrophils Relative %: 77 %
Platelets: 107 10*3/uL — ABNORMAL LOW (ref 150–400)
RBC: 3.03 MIL/uL — ABNORMAL LOW (ref 4.22–5.81)
RDW: 13.2 % (ref 11.5–15.5)
WBC: 10.4 10*3/uL (ref 4.0–10.5)
nRBC: 0 % (ref 0.0–0.2)

## 2018-09-28 MED ORDER — AMLODIPINE BESYLATE 5 MG PO TABS
5.0000 mg | ORAL_TABLET | Freq: Every day | ORAL | Status: DC
  Administered 2018-09-28 – 2018-10-02 (×5): 5 mg via ORAL
  Filled 2018-09-28 (×5): qty 1

## 2018-09-28 MED ORDER — SODIUM CHLORIDE 0.9 % IV SOLN
INTRAVENOUS | Status: DC
  Administered 2018-09-28 (×2): via INTRAVENOUS

## 2018-09-28 MED ORDER — ATORVASTATIN CALCIUM 40 MG PO TABS
40.0000 mg | ORAL_TABLET | Freq: Every day | ORAL | Status: DC
  Administered 2018-09-28 – 2018-10-01 (×4): 40 mg via ORAL
  Filled 2018-09-28 (×4): qty 1

## 2018-09-28 MED ORDER — TIMOLOL MALEATE 0.5 % OP SOLN
1.0000 [drp] | Freq: Two times a day (BID) | OPHTHALMIC | Status: DC
  Administered 2018-09-28 – 2018-10-01 (×7): 1 [drp] via OPHTHALMIC
  Filled 2018-09-28: qty 5

## 2018-09-28 NOTE — Progress Notes (Signed)
[facility] PROGRESS NOTE    Progress Note  Randy Chen  NLG:[id number] DOB: January 31, 1928 DOA: 09/25/2018 PCP: [name], MD     Brief Narrative:   Randy Chen is an 85 y.o. male past medical history significant for aortic stenosis status post CABG x5 and 89 prostate cancer hypertension who comes into the emergency department complaining of fever fatigue decreased oral intake, decreased urine output suprapubic and lower back pain that started 4 days prior to admission and worsened last night, she has been prescribed cephalexin by her physician.  Because of her pain and symptoms she went into to urgent care which referred her to the ED.  Assessment/Plan:   Sepsis due to probable acute UTI: Sepsis syndrome is resolved. Urine culture grew enterococcus sensitive to vancomycin and ampicillin. Blood cultures grew enterococcus   ID has been consulted recommended to narrow empiric antibiotics to ampicillin and Rocephin to cover for endocarditis, as he has a bioprosthetic valve as he is at high risk of an seeding.   Recommended 6 weeks of IV antibiotic MRI of the lumbar Chen and sacral iliac did not show discitis or osteomyelitis.   High suspicion for discitis and osteomyelitis will probably need to repeat the MRI in 2 or 3 days. There were no vegetations on 2D echo. He did spike a fever of 101.3 overnight use Tylenol. Physical therapy evaluation mended skilled nursing facility.  AKI (acute kidney injury) (HCC)/acute urinary retention: Secondary to decreased oral intake and ACE inhibitor use and probable urinary retention. His baseline creatinine is around 1.1. Continue IV fluids for an additional day.  Creatinine is improving slowly.  Essential hypertension: Continue metoprolol continue to hold all other antihypertensive medication.  Hypovolemic hyponatremia: Resolved with IV fluid hydration.  Hypercholesterolemia Continue to hold statins.  History of  atherosclerotic cardiovascular disease Continue beta-blockers and antiplatelet therapy. resume statins.  Normocytic Anemia Anemia panel showed a TIBC of 206 saturation ratio of 6, ferritin of 700. Her ferritin is acting as an acute phase reactant and she will need further evaluation as an outpatient. There are no overt signs of overt bleeding.  Acute thrombocytopenia: No signs of bleeding, LDH 254, haptoglobin pending. Bilirubin is less than 1. Question due to sepsis.  Incidental pulmonary nodule Will need to have a repeated CT scan in 3 months as an outpatient. To monitor pulmonary nodule seen on CT scan done on 09/26/2018  B12 deficiency: Start intramuscular B12.   DVT prophylaxis: lovenox Family Communication:none Disposition Plan/Barrier to D/C: once sepsis improved Code Status:     Code Status Orders  (From admission, onward)         Start     Ordered   09/25/18 1710  Full code  Continuous     09/25/18 1712        Code Status History    Date Active Date Inactive Code Status Order ID Comments User Context   01/10/2015 0947 01/11/2015 2222 Full Code [id number]  [name], MD Inpatient   01/08/2015 2131 01/10/2015 0947 Full Code [id number]  Lavina Hamman, MD Inpatient   06/23/2012 2319 06/25/2012 0055 DNR [id number]  Jaynie Crumble, RN Inpatient        IV Access:    Peripheral IV   Procedures and diagnostic studies:   Randy Thoracic Chen Wo Contrast  Result Date: 09/27/2018 CLINICAL DATA:  Back pain. History of prostate cancer. Sepsis, rule out infection EXAM: MRI THORACIC Chen WITHOUT CONTRAST TECHNIQUE: Multiplanar, multisequence Randy imaging of the thoracic  Chen was performed. No intravenous contrast was administered. COMPARISON:  Lumbar MRI 09/26/2018.  Chest two-view 09/12/2004 FINDINGS: Alignment:  Normal alignment with moderate kyphosis. Vertebrae: Negative for fracture or mass. Numerous hemangiomata are present throughout the thoracic vertebra. No bone  marrow edema. Cord:  No cord compression.  Cord signal normal throughout. Paraspinal and other soft tissues: Small bilateral pleural effusions. No paraspinous mass or abscess. Disc levels: Negative for discitis osteomyelitis in the thoracic Chen. Multilevel degenerative changes throughout the thoracic Chen. No spinal stenosis. Small right-sided disc protrusion T8-9. Small central disc protrusion T7-8. Generous size spinal canal. Bilateral facet degeneration at T10-11. IMPRESSION: No acute abnormality. Multilevel degenerative change without stenosis. Electronically Signed   By: Franchot Gallo M.D.   On: 09/27/2018 12:15   Randy Chen Wo Contrast  Result Date: 09/26/2018 CLINICAL DATA:  Intracarpal sepsis. Fever with suprapubic and low back pain. EXAM: MRI LUMBAR Chen WITHOUT CONTRAST TECHNIQUE: Multiplanar, multisequence Randy imaging of the lumbar Chen was performed. No intravenous contrast was administered. COMPARISON:  None. FINDINGS: Segmentation: Normal. The lowest disc space is considered to be L5-S1. Alignment:  Normal Vertebrae: No acute compression fracture, discitis-osteomyelitis of focal marrow lesion. Conus medullaris and cauda equina: The conus medullaris terminates at the L1 level. The cauda equina and conus medullaris are both normal. Paraspinal and other soft tissues: The visualized retroperitoneal organs and paraspinal soft tissues are normal. Disc levels: Sagittal plane imaging includes the T11-12 disc level through the upper sacrum, with axial imaging of the L1-2 to L5-S1 disc levels. L1-2: Large anterior osteophyte. No spinal canal or neural foraminal stenosis. Normal facets. L2-3: Mild disc bulge with narrowing of the lateral recesses, right greater than left. Mild bilateral neural foraminal stenosis. No central spinal canal stenosis. L3-4: Mild disc bulge with right-greater-than-left lateral recess narrowing. No central spinal canal stenosis. Mild right foraminal stenosis. L4-5:  Intermediate disc bulge and moderate facet hypertrophy. Moderate spinal canal stenosis with mild right and moderate left neural foraminal stenosis. L5-S1: Small central disc extrusion with superior migration narrowing the right lateral recess and slightly displacing the right S1 nerve root. No central spinal canal stenosis. Moderate bilateral foraminal stenosis. The visualized portion of the sacrum is normal. IMPRESSION: 1. No acute abnormality or evidence for discitis-osteomyelitis. 2. Moderate L4-5 spinal canal stenosis with mild right, moderate left neural foraminal stenosis. 3. L5-S1 right lateral recess narrowing with slight displacement of the descending right S1 nerve root. Moderate bilateral foraminal stenosis. Electronically Signed   By: Ulyses Jarred M.D.   On: 09/26/2018 19:09   Randy Sacrum Si Joints Wo Contrast  Result Date: 09/27/2018 CLINICAL DATA:  Fever, sepsis, suprapubic and low back pain. EXAM: MRI PELVIS WITHOUT CONTRAST (sacrum protocol) TECHNIQUE: Multiplanar multisequence Randy imaging of the pelvis was performed. No intravenous contrast was administered. COMPARISON:  MRI lumbar Chen 09/26/2018; CT pelvis 09/25/2018 FINDINGS: Osseous structures and joints: Both sacroiliac joints. Unremarkable. No evidence septic joint. Degenerative facet arthropathy in the lower lumbar Chen. No abnormal edema signal in the sacrum. Musculotendinous: No significant regional muscular edema. Soft tissue/other: Prostatectomy. Suspected trace pelvic ascites, significance uncertain. Sigmoid colon diverticulosis. No sciatic notch impingement. IMPRESSION: 1. The sacroiliac joints appear unremarkable. No sacral abnormality observed. 2. Trace pelvic ascites. 3. Prostatectomy. Electronically Signed   By: Van Clines M.D.   On: 09/27/2018 07:51     Medical Consultants:    None.  Anti-Infectives:   IV Rocephin  Subjective:    [name] he relates his back abdominal spasms  are  improved  Objective:    Vitals:   09/28/18 0418 09/28/18 0516 09/28/18 0600 09/28/18 0924  BP: (!) 131/56   (!) 150/59  Pulse: 88   92  Resp: (!) 28   18  Temp: (!) 101.3 F (38.5 C)  100 F (37.8 C) 98 F (36.7 C)  TempSrc: Oral  Oral Oral  SpO2: (!) 89%   90%  Weight:  87.5 kg    Height:        Intake/Output Summary (Last 24 hours) at 09/28/2018 0956 Last data filed at 09/28/2018 0600 Gross per 24 hour  Intake 1112.52 ml  Output 850 ml  Net 262.52 ml   Filed Weights   09/26/18 0614 09/28/18 0516  Weight: 90 kg 87.5 kg    Exam: General exam: In no acute distress. Respiratory system: Good air movement and clear to auscultation. Cardiovascular system: S1 & S2 heard, RRR.  Gastrointestinal system: Abdomen is nondistended, soft and nontender.  Central nervous system: Alert and oriented.  Straight leg raise B/L causes spastic pain Extremities: No pedal edema. Skin: No rashes, lesions or ulcers, no petechial rashes. Psychiatry: Judgement and insight appear normal. Mood & affect appropriate.    Data Reviewed:    Labs: Basic Metabolic Panel: Recent Labs  Lab 09/25/18 1359 09/25/18 2016 09/26/18 0535 09/27/18 0709 09/28/18 0211  NA 132* 134* 135 136 135  K 4.6 3.7 3.8 3.8 3.9  CL 97* 102 103 104 102  CO2 20* 21* 20* 21* 21*  GLUCOSE 140* 151* 126* 131* 134*  BUN 59* 54* 47* 37* 30*  CREATININE 3.53* 2.89* 2.50* 2.15* 1.91*  CALCIUM 9.0 8.2* 8.2* 8.3* 8.2*   GFR Estimated Creatinine Clearance: 27.6 mL/min (A) (by C-G formula based on SCr of 1.91 mg/dL (H)). Liver Function Tests: Recent Labs  Lab 09/25/18 1359 09/26/18 0535  AST 61* 45*  ALT 42 34  ALKPHOS 53 47  BILITOT 1.0 0.7  PROT 6.4* 5.3*  ALBUMIN 3.5 3.0*   Recent Labs  Lab 09/25/18 1359  LIPASE 35   No results for input(s): AMMONIA in the last 168 hours. Coagulation profile No results for input(s): INR, PROTIME in the last 168 hours.  CBC: Recent Labs  Lab 09/25/18 1359  09/26/18 0535 09/27/18 0709 09/28/18 0211  WBC 9.2 7.0 8.6 10.4  NEUTROABS  --  5.4 6.5 8.0*  HGB 10.9* 9.6* 10.2* 9.4*  HCT 33.5* 29.4* 30.1* 27.4*  MCV 93.1 92.5 92.0 90.4  PLT 88* 84* 87* 107*   Cardiac Enzymes: Recent Labs  Lab 09/25/18 2016 09/27/18 0709  CKTOTAL 333 90   BNP (last 3 results) No results for input(s): PROBNP in the last 8760 hours. CBG: No results for input(s): GLUCAP in the last 168 hours. D-Dimer: No results for input(s): DDIMER in the last 72 hours. Hgb A1c: No results for input(s): HGBA1C in the last 72 hours. Lipid Profile: No results for input(s): CHOL, HDL, LDLCALC, TRIG, CHOLHDL, LDLDIRECT in the last 72 hours. Thyroid function studies: No results for input(s): TSH, T4TOTAL, T3FREE, THYROIDAB in the last 72 hours.  Invalid input(s): FREET3 Anemia work up: Recent Labs    09/26/18 0535  VITAMINB12 249  FOLATE 58.5  FERRITIN 762*  TIBC 206*  IRON 13*  RETICCTPCT 1.0   Sepsis Labs: Recent Labs  Lab 09/25/18 1359 09/25/18 2016 09/26/18 0535 09/27/18 0709 09/28/18 0211  WBC 9.2  --  7.0 8.6 10.4  LATICACIDVEN 2.2* 1.2  --   --   --  Microbiology Recent Results (from the past 240 hour(s))  Culture, Urine     Status: Abnormal   Collection Time: 09/25/18  7:51 PM  Result Value Ref Range Status   Specimen Description URINE, RANDOM  Final   Special Requests   Final    NONE Performed at [facility], [address], [place], [place] [address]    Culture 10,000 COLONIES/mL ENTEROCOCCUS FAECALIS (A)  Final   Report Status 09/27/2018 FINAL  Final   Organism ID, Bacteria ENTEROCOCCUS FAECALIS (A)  Final      Susceptibility   Enterococcus faecalis - MIC*    AMPICILLIN <=2 SENSITIVE Sensitive     LEVOFLOXACIN 1 SENSITIVE Sensitive     NITROFURANTOIN <=16 SENSITIVE Sensitive     VANCOMYCIN 1 SENSITIVE Sensitive     * 10,000 COLONIES/mL ENTEROCOCCUS FAECALIS  Culture, blood (routine x 2)     Status: Abnormal   Collection  Time: 09/25/18  8:15 PM  Result Value Ref Range Status   Specimen Description BLOOD LEFT FOREARM  Final   Special Requests   Final    BOTTLES DRAWN AEROBIC AND ANAEROBIC Blood Culture adequate volume   Culture  Setup Time   Final    GRAM POSITIVE COCCI IN BOTH AEROBIC AND ANAEROBIC BOTTLES Organism ID to follow CRITICAL RESULT CALLED TO, READ BACK BY AND VERIFIED WITH: Royann Shivers PharmD 11:50 09/26/18 ([email or web address]) Performed at [facility], [place] [address] [place],  [address]    Culture ENTEROCOCCUS FAECALIS (A)  Final   Report Status 09/28/2018 FINAL  Final   Organism ID, Bacteria ENTEROCOCCUS FAECALIS  Final      Susceptibility   Enterococcus faecalis - MIC*    AMPICILLIN <=2 SENSITIVE Sensitive     VANCOMYCIN 1 SENSITIVE Sensitive     GENTAMICIN SYNERGY SENSITIVE Sensitive     * ENTEROCOCCUS FAECALIS  Blood Culture ID Panel (Reflexed)     Status: Abnormal   Collection Time: 09/25/18  8:15 PM  Result Value Ref Range Status   Enterococcus species DETECTED (A) NOT DETECTED Final    Comment: CRITICAL RESULT CALLED TO, READ BACK BY AND VERIFIED WITH: Royann Shivers PharmD 11:50 09/26/18 ([email or web address])    Vancomycin resistance NOT DETECTED NOT DETECTED Final   Listeria monocytogenes NOT DETECTED NOT DETECTED Final   Staphylococcus species NOT DETECTED NOT DETECTED Final   Staphylococcus aureus (BCID) NOT DETECTED NOT DETECTED Final   Streptococcus species NOT DETECTED NOT DETECTED Final   Streptococcus agalactiae NOT DETECTED NOT DETECTED Final   Streptococcus pneumoniae NOT DETECTED NOT DETECTED Final   Streptococcus pyogenes NOT DETECTED NOT DETECTED Final   Acinetobacter baumannii NOT DETECTED NOT DETECTED Final   Enterobacteriaceae species NOT DETECTED NOT DETECTED Final   Enterobacter cloacae complex NOT DETECTED NOT DETECTED Final   Escherichia coli NOT DETECTED NOT DETECTED Final   Klebsiella oxytoca NOT DETECTED NOT DETECTED Final   Klebsiella pneumoniae NOT DETECTED NOT  DETECTED Final   Proteus species NOT DETECTED NOT DETECTED Final   Serratia marcescens NOT DETECTED NOT DETECTED Final   Haemophilus influenzae NOT DETECTED NOT DETECTED Final   Neisseria meningitidis NOT DETECTED NOT DETECTED Final   Pseudomonas aeruginosa NOT DETECTED NOT DETECTED Final   Candida albicans NOT DETECTED NOT DETECTED Final   Candida glabrata NOT DETECTED NOT DETECTED Final   Candida krusei NOT DETECTED NOT DETECTED Final   Candida parapsilosis NOT DETECTED NOT DETECTED Final   Candida tropicalis NOT DETECTED NOT DETECTED Final    Comment:  Performed at [facility], [place] [address] [place], [place] [address]  Culture, blood (routine x 2)     Status: Abnormal   Collection Time: 09/25/18  8:20 PM  Result Value Ref Range Status   Specimen Description BLOOD LEFT ANTECUBITAL  Final   Special Requests   Final    BOTTLES DRAWN AEROBIC AND ANAEROBIC Blood Culture results may not be optimal due to an inadequate volume of blood received in culture bottles   Culture  Setup Time   Final    GRAM POSITIVE COCCI IN BOTH AEROBIC AND ANAEROBIC BOTTLES CRITICAL VALUE NOTED.  VALUE IS CONSISTENT WITH PREVIOUSLY REPORTED AND CALLED VALUE.    Culture (A)  Final    ENTEROCOCCUS FAECALIS SUSCEPTIBILITIES PERFORMED ON PREVIOUS CULTURE WITHIN THE LAST 5 DAYS. Performed at [facility], [place] [address], [place], [place] [address]    Report Status 09/28/2018 FINAL  Final  Blood culture (routine x 2)     Status: None (Preliminary result)   Collection Time: 09/27/18 12:25 PM  Result Value Ref Range Status   Specimen Description BLOOD LEFT ANTECUBITAL  Final   Special Requests   Final    BOTTLES DRAWN AEROBIC AND ANAEROBIC Blood Culture adequate volume   Culture   Final    NO GROWTH < 24 HOURS Performed at [facility], [place] [address] [place], [place] [address]    Report Status PENDING  Incomplete  Blood culture (routine x 2)     Status: None (Preliminary result)    Collection Time: 09/27/18 12:31 PM  Result Value Ref Range Status   Specimen Description BLOOD RIGHT ANTECUBITAL  Final   Special Requests   Final    BOTTLES DRAWN AEROBIC AND ANAEROBIC Blood Culture adequate volume   Culture   Final    NO GROWTH < 24 HOURS Performed at [facility], [place] [address] [place], [place] [address]    Report Status PENDING  Incomplete     Medications:    aspirin EC  81 mg Oral Daily   cyanocobalamin  1,000 mcg Intramuscular Daily   cyclobenzaprine  5 mg Oral BID   metoprolol succinate  50 mg Oral Daily   polyethylene glycol  17 g Oral BID   Continuous Infusions:  sodium chloride 75 mL/hr at 09/28/18 0718   ampicillin (OMNIPEN) IV Stopped (09/28/18 0553)   cefTRIAXone (ROCEPHIN)  IV 2 g (09/27/18 2253)      LOS: 2 days   Charlynne Cousins  [facility]  09/28/2018, 9:56 AM

## 2018-09-28 NOTE — NC FL2 (Signed)
[place] MEDICAID FL2 LEVEL OF CARE SCREENING TOOL     IDENTIFICATION  Patient Name: Randy Chen Birthdate: January 12, 1928 Sex: male Admission Date (Current Location): 09/25/2018  [facility] and [place] Number:  Herbalist and Address:  [facility], [place] [address], [place], [place] [address]      Provider Number: [id number]  Attending Physician Name and Address:  Charlynne Cousins, MD  Relative Name and Phone Number:  Lukus, Binion, [place], Daughter, [phone]    Current Level of Care: Hospital Recommended Level of Care: [place] Prior Approval Number:    Date Approved/Denied: 01/11/15 PASRR Number: [id number]  Discharge Plan: SNF    Current Diagnoses: Patient Active Problem List   Diagnosis Date Noted  . Acute lower UTI 09/26/2018  . Enterococcal bacteremia   . AKI (acute kidney injury) ([place]) 09/25/2018  . Hypertension 09/25/2018  . History of atherosclerotic cardiovascular disease 09/25/2018  . Bladder retention 09/25/2018  . Anemia 09/25/2018  . Hyponatremia 09/25/2018  . Pulmonary nodule 09/25/2018  . PAD (peripheral artery disease) ([place]) 06/06/2017  . Fatigue 04/26/2017  . Claudication of both lower extremities ([place]) 04/26/2017  . Cellulitis of left lower leg 01/08/2015  . Cellulitis 01/08/2015  . S/P AVR 01/08/2015  . Chest discomfort 05/13/2014  . Chest pain 05/04/2014  . Diverticulosis 05/13/2013  . Vertigo 05/13/2013  . Colitis 06/23/2012  . Hx of CABG 11/18/2010  . History of prosthetic aortic valve 11/18/2010  . Benign hypertensive heart disease without heart failure 11/18/2010  . Hypercholesterolemia 11/18/2010  . History of prostate cancer 11/18/2010    Orientation RESPIRATION BLADDER Height & Weight     Self, Time, Situation, Place  Normal Incontinent, External catheter Weight: 193 lb (87.5 kg) Height:  5\' 10"  (177.8 cm)  BEHAVIORAL SYMPTOMS/MOOD  NEUROLOGICAL BOWEL NUTRITION STATUS      Continent Diet(Heart Healthy, thin liquids)  AMBULATORY STATUS COMMUNICATION OF NEEDS Skin   Supervision Verbally Normal, Other (Comment)(MASD on buttocks, barrier cream applied)                       Personal Care Assistance Level of Assistance  Bathing, Feeding, Dressing, Total care Bathing Assistance: Limited assistance Feeding assistance: Independent Dressing Assistance: Limited assistance Total Care Assistance: Limited assistance   Functional Limitations Info  Hearing, Sight, Speech Sight Info: Adequate Hearing Info: Adequate Speech Info: Adequate    SPECIAL CARE FACTORS FREQUENCY  PT (By licensed PT), OT (By licensed OT)     PT Frequency: 5x/wk OT Frequency: 5x/wk            Contractures Contractures Info: Not present    Additional Factors Info  Code Status, Allergies Code Status Info: Full Code Allergies Info: No Known Allergies           Current Medications (09/28/2018):  This is the current hospital active medication list Current Facility-Administered Medications  Medication Dose Route Frequency Provider Last Rate Last Dose  . 0.9 %  sodium chloride infusion   Intravenous Continuous Charlynne Cousins, MD      . acetaminophen (TYLENOL) tablet 650 mg  650 mg Oral Q6H PRN Reubin Milan, MD   650 mg at 09/28/18 0458   Or  . acetaminophen (TYLENOL) suppository 650 mg  650 mg Rectal Q6H PRN Reubin Milan, MD      . amLODipine [facility]) tablet 5 mg  5 mg Oral Daily Charlynne Cousins, MD      .  ampicillin (OMNIPEN) 2 g in sodium chloride 0.9 % 100 mL IVPB  2 g Intravenous Q8H Tommy Medal, [name], MD   Stopped at 09/28/18 [phone]  . aspirin EC tablet 81 mg  81 mg Oral Daily Reubin Milan, MD   81 mg at 09/28/18 0946  . atorvastatin (LIPITOR) tablet 40 mg  40 mg Oral q1800 Charlynne Cousins, MD      . cefTRIAXone (ROCEPHIN) 2 g in sodium chloride 0.9 % 100 mL IVPB  2 g Intravenous Q12H Tommy Medal,  [name], MD 200 mL/hr at 09/28/18 0959 2 g at 09/28/18 0959  . cyanocobalamin ((VITAMIN B-12)) injection 1,000 mcg  1,000 mcg Intramuscular Daily Charlynne Cousins, MD   1,000 mcg at 09/27/18 1034  . cyclobenzaprine (FLEXERIL) tablet 5 mg  5 mg Oral BID Charlynne Cousins, MD   5 mg at 09/28/18 0946  . HYDROmorphone (DILAUDID) injection 0.5 mg  0.5 mg Intravenous Q3H PRN Reubin Milan, MD   0.5 mg at 09/27/18 1105  . metoprolol succinate (TOPROL-XL) 24 hr tablet 50 mg  50 mg Oral Daily Reubin Milan, MD   50 mg at 09/27/18 1335  . ondansetron (ZOFRAN) tablet 4 mg  4 mg Oral Q6H PRN Reubin Milan, MD       Or  . ondansetron [facility]) injection 4 mg  4 mg Intravenous Q6H PRN Reubin Milan, MD   4 mg at 09/26/18 2136  . polyethylene glycol (MIRALAX / GLYCOLAX) packet 17 g  17 g Oral BID Charlynne Cousins, MD   17 g at 09/28/18 0946  . timolol (TIMOPTIC) 0.5 % ophthalmic solution 1 drop  1 drop Right Eye BID Charlynne Cousins, MD         Discharge Medications: Please see discharge summary for a list of discharge medications.  Relevant Imaging Results:  Relevant Lab Results:   Additional Information SSN: [id number]  Philippa Chester Babacar Haycraft, LCSWA

## 2018-09-28 NOTE — Progress Notes (Signed)
Patient had runs of Bigeminy PVCs. Patient did not have any complains about pain and was asymptomatic when assessed. Bodenheimer, NP notified.

## 2018-09-29 ENCOUNTER — Inpatient Hospital Stay: Payer: Self-pay

## 2018-09-29 LAB — BASIC METABOLIC PANEL
Anion gap: 11 (ref 5–15)
BUN: 30 mg/dL — ABNORMAL HIGH (ref 8–23)
CO2: 21 mmol/L — ABNORMAL LOW (ref 22–32)
Calcium: 8.2 mg/dL — ABNORMAL LOW (ref 8.9–10.3)
Chloride: 103 mmol/L (ref 98–111)
Creatinine, Ser: 1.87 mg/dL — ABNORMAL HIGH (ref 0.61–1.24)
GFR calc Af Amer: 36 mL/min — ABNORMAL LOW (ref >60)
GFR calc non Af Amer: 31 mL/min — ABNORMAL LOW (ref >60)
Glucose, Bld: 133 mg/dL — ABNORMAL HIGH (ref 70–99)
Potassium: 3.9 mmol/L (ref 3.5–5.1)
Sodium: 135 mmol/L (ref 135–145)

## 2018-09-29 LAB — CBC WITH DIFFERENTIAL/PLATELET
Abs Immature Granulocytes: 0.13 10*3/uL — ABNORMAL HIGH (ref 0.00–0.07)
Basophils Absolute: 0 10*3/uL (ref 0.0–0.1)
Basophils Relative: 0 %
Eosinophils Absolute: 0 10*3/uL (ref 0.0–0.5)
Eosinophils Relative: 0 %
HCT: 30 % — ABNORMAL LOW (ref 39.0–52.0)
Hemoglobin: 10.1 g/dL — ABNORMAL LOW (ref 13.0–17.0)
Immature Granulocytes: 1 %
Lymphocytes Relative: 15 %
Lymphs Abs: 1.9 10*3/uL (ref 0.7–4.0)
MCH: 30.8 pg (ref 26.0–34.0)
MCHC: 33.7 g/dL (ref 30.0–36.0)
MCV: 91.5 fL (ref 80.0–100.0)
Monocytes Absolute: 1.2 10*3/uL — ABNORMAL HIGH (ref 0.1–1.0)
Monocytes Relative: 9 %
Neutro Abs: 9.2 10*3/uL — ABNORMAL HIGH (ref 1.7–7.7)
Neutrophils Relative %: 75 %
Platelets: 163 10*3/uL (ref 150–400)
RBC: 3.28 MIL/uL — ABNORMAL LOW (ref 4.22–5.81)
RDW: 13.5 % (ref 11.5–15.5)
WBC: 12.5 10*3/uL — ABNORMAL HIGH (ref 4.0–10.5)
nRBC: 0 % (ref 0.0–0.2)

## 2018-09-29 MED ORDER — HYDROCODONE-ACETAMINOPHEN 5-325 MG PO TABS
1.0000 | ORAL_TABLET | ORAL | Status: DC | PRN
  Administered 2018-09-29 (×2): 1 via ORAL
  Administered 2018-09-30: 2 via ORAL
  Filled 2018-09-29: qty 1
  Filled 2018-09-29: qty 2
  Filled 2018-09-29: qty 1

## 2018-09-29 NOTE — Progress Notes (Addendum)
Subjective: He tells me his back pain is much improved since I last saw him.  He is eager to go back to his skilled nursing facility when he is able.  Antibiotics:  Anti-infectives (From admission, onward)   Start     Dose/Rate Route Frequency Ordered Stop   09/27/18 2200  cefTRIAXone (ROCEPHIN) 2 g in sodium chloride 0.9 % 100 mL IVPB     2 g 200 mL/hr over 30 Minutes Intravenous Every 12 hours 09/27/18 1114     09/27/18 1400  ampicillin (OMNIPEN) 2 g in sodium chloride 0.9 % 100 mL IVPB     2 g 300 mL/hr over 20 Minutes Intravenous Every 8 hours 09/27/18 1114     09/26/18 1500  DAPTOmycin (CUBICIN) 720 mg in sodium chloride 0.9 % IVPB  Status:  Discontinued     720 mg 228.8 mL/hr over 30 Minutes Intravenous Every 48 hours 09/26/18 1247 09/27/18 1114   09/25/18 1830  cefTRIAXone (ROCEPHIN) 1 g in sodium chloride 0.9 % 100 mL IVPB  Status:  Discontinued     1 g 200 mL/hr over 30 Minutes Intravenous Every 24 hours 09/25/18 1823 09/26/18 1208      Medications: Scheduled Meds: . amLODipine  5 mg Oral Daily  . aspirin EC  81 mg Oral Daily  . atorvastatin  40 mg Oral q1800  . cyanocobalamin  1,000 mcg Intramuscular Daily  . cyclobenzaprine  5 mg Oral BID  . metoprolol succinate  50 mg Oral Daily  . timolol  1 drop Right Eye BID   Continuous Infusions: . ampicillin (OMNIPEN) IV 2 g (09/29/18 8337)  . cefTRIAXone (ROCEPHIN)  IV 2 g (09/29/18 0909)   PRN Meds:.HYDROcodone-acetaminophen, ondansetron **OR** ondansetron (ZOFRAN) IV    Objective: Weight change: -0.244 kg  Intake/Output Summary (Last 24 hours) at 09/29/2018 1022 Last data filed at 09/29/2018 0906 Gross per 24 hour  Intake 2172.95 ml  Output 1100 ml  Net 1072.95 ml   Blood pressure 135/66, pulse 97, temperature 97.9 F (36.6 C), temperature source Oral, resp. rate 18, height '5\' 10"'  (1.778 m), weight 87.3 kg, SpO2 94 %. Temp:  [97.8 F (36.6 C)-99.9 F (37.7 C)] 97.9 F (36.6 C) (04/12 0857) Pulse  Rate:  [82-98] 97 (04/12 0857) Resp:  [16-20] 18 (04/12 0857) BP: (112-151)/(60-77) 135/66 (04/12 0857) SpO2:  [94 %-95 %] 94 % (04/12 0857) Weight:  [87.3 kg] 87.3 kg (04/11 2059)  Physical Exam: General: Alert and awake, oriented x3, not in any acute distress. HEENT: anicteric sclera, EOMI CVS regular rate, normal  Chest: , no wheezing, no respiratory distress Abdomen: soft non-distended,  Extremities: no edema or deformity noted bilaterally Skin: no rashes Neuro: nonfocal  CBC:    BMET Recent Labs    09/28/18 0211 09/29/18 0549  NA 135 135  K 3.9 3.9  CL 102 103  CO2 21* 21*  GLUCOSE 134* 133*  BUN 30* 30*  CREATININE 1.91* 1.87*  CALCIUM 8.2* 8.2*     Liver Panel  No results for input(s): PROT, ALBUMIN, AST, ALT, ALKPHOS, BILITOT, BILIDIR, IBILI in the last 72 hours.     Sedimentation Rate No results for input(s): ESRSEDRATE in the last 72 hours. C-Reactive Protein No results for input(s): CRP in the last 72 hours.  Micro Results: Recent Results (from the past 720 hour(s))  Culture, Urine     Status: Abnormal   Collection Time: 09/25/18  7:51 PM  Result Value Ref Range Status  Specimen Description URINE, RANDOM  Final   Special Requests   Final    NONE Performed at [facility], [place] [address], [place], [place] [address]    Culture 10,000 COLONIES/mL ENTEROCOCCUS FAECALIS (A)  Final   Report Status 09/27/2018 FINAL  Final   Organism ID, Bacteria ENTEROCOCCUS FAECALIS (A)  Final      Susceptibility   Enterococcus faecalis - MIC*    AMPICILLIN <=2 SENSITIVE Sensitive     LEVOFLOXACIN 1 SENSITIVE Sensitive     NITROFURANTOIN <=16 SENSITIVE Sensitive     VANCOMYCIN 1 SENSITIVE Sensitive     * 10,000 COLONIES/mL ENTEROCOCCUS FAECALIS  Culture, blood (routine x 2)     Status: Abnormal   Collection Time: 09/25/18  8:15 PM  Result Value Ref Range Status   Specimen Description BLOOD LEFT FOREARM  Final   Special Requests   Final    BOTTLES  DRAWN AEROBIC AND ANAEROBIC Blood Culture adequate volume   Culture  Setup Time   Final    GRAM POSITIVE COCCI IN BOTH AEROBIC AND ANAEROBIC BOTTLES Organism ID to follow CRITICAL RESULT CALLED TO, READ BACK BY AND VERIFIED WITH: Royann Shivers PharmD 11:50 09/26/18 ([email or web address]) Performed at [facility], [place] [address], [place], [place] [address]    Culture ENTEROCOCCUS FAECALIS (A)  Final   Report Status 09/28/2018 FINAL  Final   Organism ID, Bacteria ENTEROCOCCUS FAECALIS  Final      Susceptibility   Enterococcus faecalis - MIC*    AMPICILLIN <=2 SENSITIVE Sensitive     VANCOMYCIN 1 SENSITIVE Sensitive     GENTAMICIN SYNERGY SENSITIVE Sensitive     * ENTEROCOCCUS FAECALIS  Blood Culture ID Panel (Reflexed)     Status: Abnormal   Collection Time: 09/25/18  8:15 PM  Result Value Ref Range Status   Enterococcus species DETECTED (A) NOT DETECTED Final    Comment: CRITICAL RESULT CALLED TO, READ BACK BY AND VERIFIED WITH: Royann Shivers PharmD 11:50 09/26/18 ([email or web address])    Vancomycin resistance NOT DETECTED NOT DETECTED Final   Listeria monocytogenes NOT DETECTED NOT DETECTED Final   Staphylococcus species NOT DETECTED NOT DETECTED Final   Staphylococcus aureus (BCID) NOT DETECTED NOT DETECTED Final   Streptococcus species NOT DETECTED NOT DETECTED Final   Streptococcus agalactiae NOT DETECTED NOT DETECTED Final   Streptococcus pneumoniae NOT DETECTED NOT DETECTED Final   Streptococcus pyogenes NOT DETECTED NOT DETECTED Final   Acinetobacter baumannii NOT DETECTED NOT DETECTED Final   Enterobacteriaceae species NOT DETECTED NOT DETECTED Final   Enterobacter cloacae complex NOT DETECTED NOT DETECTED Final   Escherichia coli NOT DETECTED NOT DETECTED Final   Klebsiella oxytoca NOT DETECTED NOT DETECTED Final   Klebsiella pneumoniae NOT DETECTED NOT DETECTED Final   Proteus species NOT DETECTED NOT DETECTED Final   Serratia marcescens NOT DETECTED NOT DETECTED Final   Haemophilus influenzae  NOT DETECTED NOT DETECTED Final   Neisseria meningitidis NOT DETECTED NOT DETECTED Final   Pseudomonas aeruginosa NOT DETECTED NOT DETECTED Final   Candida albicans NOT DETECTED NOT DETECTED Final   Candida glabrata NOT DETECTED NOT DETECTED Final   Candida krusei NOT DETECTED NOT DETECTED Final   Candida parapsilosis NOT DETECTED NOT DETECTED Final   Candida tropicalis NOT DETECTED NOT DETECTED Final    Comment: Performed at [facility], [place] [address], [place], [place] [address]  Culture, blood (routine x 2)     Status: Abnormal   Collection Time: 09/25/18  8:20 PM  Result Value Ref Range Status   Specimen Description BLOOD LEFT ANTECUBITAL  Final   Special Requests   Final    BOTTLES DRAWN AEROBIC AND ANAEROBIC Blood Culture results may not be optimal due to an inadequate volume of blood received in culture bottles   Culture  Setup Time   Final    GRAM POSITIVE COCCI IN BOTH AEROBIC AND ANAEROBIC BOTTLES CRITICAL VALUE NOTED.  VALUE IS CONSISTENT WITH PREVIOUSLY REPORTED AND CALLED VALUE.    Culture (A)  Final    ENTEROCOCCUS FAECALIS SUSCEPTIBILITIES PERFORMED ON PREVIOUS CULTURE WITHIN THE LAST 5 DAYS. Performed at [facility], [place] [address] [place], [place] [address]    Report Status 09/28/2018 FINAL  Final  Blood culture (routine x 2)     Status: None (Preliminary result)   Collection Time: 09/27/18 12:25 PM  Result Value Ref Range Status   Specimen Description BLOOD LEFT ANTECUBITAL  Final   Special Requests   Final    BOTTLES DRAWN AEROBIC AND ANAEROBIC Blood Culture adequate volume   Culture   Final    NO GROWTH 2 DAYS Performed at [facility], [place] [address] [place], [place] [address]    Report Status PENDING  Incomplete  Blood culture (routine x 2)     Status: None (Preliminary result)   Collection Time: 09/27/18 12:31 PM  Result Value Ref Range Status   Specimen Description BLOOD RIGHT ANTECUBITAL  Final   Special Requests   Final     BOTTLES DRAWN AEROBIC AND ANAEROBIC Blood Culture adequate volume   Culture   Final    NO GROWTH 2 DAYS Performed at [facility], [place] [address] [place],  [address]    Report Status PENDING  Incomplete    Studies/Results: Mr Thoracic Spine Wo Contrast  Result Date: 09/27/2018 CLINICAL DATA:  Back pain. History of prostate cancer. Sepsis, rule out infection EXAM: MRI THORACIC SPINE WITHOUT CONTRAST TECHNIQUE: Multiplanar, multisequence MR imaging of the thoracic spine was performed. No intravenous contrast was administered. COMPARISON:  Lumbar MRI 09/26/2018.  Chest two-view 09/12/2004 FINDINGS: Alignment:  Normal alignment with moderate kyphosis. Vertebrae: Negative for fracture or mass. Numerous hemangiomata are present throughout the thoracic vertebra. No bone marrow edema. Cord:  No cord compression.  Cord signal normal throughout. Paraspinal and other soft tissues: Small bilateral pleural effusions. No paraspinous mass or abscess. Disc levels: Negative for discitis osteomyelitis in the thoracic spine. Multilevel degenerative changes throughout the thoracic spine. No spinal stenosis. Small right-sided disc protrusion T8-9. Small central disc protrusion T7-8. Generous size spinal canal. Bilateral facet degeneration at T10-11. IMPRESSION: No acute abnormality. Multilevel degenerative change without stenosis. Electronically Signed   By: Franchot Gallo M.D.   On: 09/27/2018 12:15   [place] Ekg Site Rite  Result Date: 09/29/2018 If Site Rite image not attached, placement could not be confirmed due to current cardiac rhythm.     Assessment/Plan:  INTERVAL HISTORY: Back pain better blood cultures are clearing   Principal Problem:   AKI (acute kidney injury) ([place]) Active Problems:   Hypercholesterolemia   S/P AVR   Hypertension   History of atherosclerotic cardiovascular disease   Bladder retention   Anemia   Hyponatremia   Pulmonary nodule   Acute lower UTI   Enterococcal  bacteremia    Randy Chen is a 85 y.o. male with history of a prosthetic aortic valve who was admitted with ampicillin sensitive enterococcal bacteremia and severe low back pain.  #  1  Ampicillin sensitive enterococcal bacteremia:  The source certainly could have been his urine and this might possibly represent a complicated bacteremia from a urinary source.  I found his back pain though to be much more than I would expect with typical urinary tract infection and more concerning for possible discitis that may not yet be showing itself on MRI.  I think trying to get a transesophageal echocardiogram in this patient and try to use that if it does not show overt vegetations to try to argue airways out of treating him for endocarditis is not a good idea.  I think it is more prudent to simply treat him presumptively for endocarditis and discitis with dual beta-lactam therapy with ampicillin and ceftriaxone.  Given the current coronavirus 2019 pandemic it may be problematic trying to get him an MRI as an outpatient.  I will definitely arrange for an ED visit PHONE with [place] at [facility] in one months time  Diagnosis: Enterococcal bacteremia with concern for possible prosthetic valve endocarditis  Culture Result: Ampicillin sensitive Enterococcus faecalis  No Known Allergies  OPAT Orders Discharge antibiotics: Ampicillin per pharmacy and IV ceftriaxone 2 grams IV q 12 hours   Duration:  6 weeks  End Date:  Nov 07, 2018   [facility] Care Per Protocol:    Labs Weekly while on IV antibiotics: _x_ CBC with differential _x_ BMP w GFR x__ CRP x__ ESR    _x_ Please pull PIC at completion of IV antibiotics __ Please leave PIC in place until doctor has seen patient or been notified  Fax weekly labs to [phone]  Clinic Follow Up Appt:  May 13th, at 1045 am with [name] as an E-(PHONE) visit  I will otherwise sign off for now please call with further questions.   LOS: 3  days   Alcide Evener 09/29/2018, 10:22 AM

## 2018-09-29 NOTE — TOC Initial Note (Signed)
Transition of Care [facility]) - Initial/Assessment Note    Patient Details  Name: Randy Chen MRN: [id number] Date of Birth: 01/27/1928  Transition of Care [facility]) CM/SW Contact:    Gelene Mink, [place] Phone Number: 09/29/2018, 9:25 AM  Clinical Narrative:   CSW met with the patient at bedside. Patient was alert and oriented. Patient was eating lunch. CSW introduced herself and explained her role. Patient is a resident of [facility] independent living. CSW explained that once he is medically ready he can return to [facility] and complete short-term rehab. He is in agreement with that plan. Patient had questions about his hospitalization. He stated that his doctor is running blood cultures. He is not medically ready yet, but as soon as he is, he can go to SNF at [facility].    Expected Discharge Plan: Skilled Nursing Facility Barriers to Discharge: Continued Medical Work up   Patient Goals and CMS Choice Patient states their goals for this hospitalization and ongoing recovery are:: Pt wants to return to his wife  CMS Medicare.gov Compare Post Acute Care list provided to:: Other (Comment Required)(Pt from [facility] and will return to SNF at [facility] once medically stable) Choice offered to / list presented to : NA  Expected Discharge Plan and Services Expected Discharge Plan: [place] In-house Referral: Clinical Social Work Discharge Planning Services: NA Post Acute Care Choice: [place] Living arrangements for the past 2 months: [place]                 DME Arranged: N/A DME Agency: NA HH Arranged: NA [place] Agency: NA  Prior Living Arrangements/Services Living arrangements for the past 2 months: [place] Lives with:: Spouse, Facility Resident Patient language and need for interpreter reviewed:: No Do you feel safe going back to the place where you live?: Yes      Need for Family Participation in Patient Care:  No (Comment) Care giver support system in place?: Yes (comment)   Criminal Activity/Legal Involvement Pertinent to Current Situation/Hospitalization: No - Comment as needed  Activities of Daily Living Home Assistive Devices/Equipment: Environmental consultant (specify type) ADL Screening (condition at time of admission) Patient's cognitive ability adequate to safely complete daily activities?: Yes Is the patient deaf or have difficulty hearing?: No Does the patient have difficulty seeing, even when wearing glasses/contacts?: No Does the patient have difficulty concentrating, remembering, or making decisions?: No Patient able to express need for assistance with ADLs?: Yes Does the patient have difficulty dressing or bathing?: No Independently performs ADLs?: Yes (appropriate for developmental age) Does the patient have difficulty walking or climbing stairs?: Yes Weakness of Legs: Both Weakness of Arms/Hands: None  Permission Sought/Granted Permission sought to share information with : Case Manager Permission granted to share information with : Yes, Verbal Permission Granted  Share Information with NAME: Benjamine Mola  Permission granted to share info w AGENCY: Data processing manager granted to share info w Relationship: Wife     Emotional Assessment Appearance:: Appears stated age Attitude/Demeanor/Rapport: Charismatic Affect (typically observed): Calm Orientation: : Oriented to Self, Oriented to  Time, Oriented to Place, Oriented to Situation Alcohol / Substance Use: Not Applicable Psych Involvement: No (comment)  Admission diagnosis:  AKI (acute kidney injury) ([place]) [N17.9] Patient Active Problem List   Diagnosis Date Noted  . Acute lower UTI 09/26/2018  . Enterococcal bacteremia   . AKI (acute kidney injury) ([place]) 09/25/2018  . Hypertension 09/25/2018  . History of atherosclerotic cardiovascular disease 09/25/2018  . Bladder  retention 09/25/2018  . Anemia 09/25/2018  . Hyponatremia 09/25/2018   . Pulmonary nodule 09/25/2018  . PAD (peripheral artery disease) ([place]) 06/06/2017  . Fatigue 04/26/2017  . Claudication of both lower extremities ([place]) 04/26/2017  . Cellulitis of left lower leg 01/08/2015  . Cellulitis 01/08/2015  . S/P AVR 01/08/2015  . Chest discomfort 05/13/2014  . Chest pain 05/04/2014  . Diverticulosis 05/13/2013  . Vertigo 05/13/2013  . Colitis 06/23/2012  . Hx of CABG 11/18/2010  . History of prosthetic aortic valve 11/18/2010  . Benign hypertensive heart disease without heart failure 11/18/2010  . Hypercholesterolemia 11/18/2010  . History of prostate cancer 11/18/2010   PCP:  [name], MD Pharmacy:   [facility] [place], [place] [address] Phone: [phone] Fax: [phone]  [id number] - [place],  - [address] [place] AT [place] [address] [place] [address] Phone: [phone] Fax: [phone]  [facility] [place], [place] [address] Phone: [phone] Fax: [phone]     Social Determinants of Health ([place]) Interventions    Readmission Risk Interventions Readmission Risk Prevention Plan 09/28/2018  Transportation Screening Complete  PCP or Specialist Appt within 5-7 Days Complete  Home Care Screening Complete  Medication Review (RN CM) Complete  Some recent data might be hidden

## 2018-09-29 NOTE — Progress Notes (Addendum)
PHARMACY CONSULT NOTE FOR:  OUTPATIENT  PARENTERAL ANTIBIOTIC THERAPY (OPAT)  Indication: Enterococcal bacteremia  Regimen:  Ampicillin 2 grams IV every 8 hours  Ceftriaxone 2 grams IV every 12 hours  End date: Addended to Nov 13, 2018 per MD  IV antibiotic discharge orders are pended. To discharging provider:  please sign these orders via discharge navigator,  Select New Orders & click on the button choice - Manage This Unsigned Work.     Thank you for allowing pharmacy to be a part of this patient's care.  Gwenlyn Found, Sherian Rein D PGY1 Pharmacy Resident  Phone [phone] 09/29/2018   10:42 AM

## 2018-09-29 NOTE — Progress Notes (Signed)
[facility] PROGRESS NOTE    Progress Note  RIGGS DINEEN  BJS:[id number] DOB: [birth year] DOA: 09/25/2018 PCP: [name], MD     Brief Narrative:   Randy Chen is an 85 y.o. male past medical history significant for aortic stenosis status post CABG x5 and 87 prostate cancer hypertension who comes into the emergency department complaining of fever fatigue decreased oral intake, decreased urine output suprapubic and lower back pain that started 4 days prior to admission and worsened last night, she has been prescribed cephalexin by her physician.  Because of her pain and symptoms she went into to urgent care which referred her to the ED.  Assessment/Plan:   Sepsis due to probable acute UTI: Sepsis syndrome is resolved. Urine culture grew enterococcus sensitive to vancomycin and ampicillin. Blood cultures on 09/25/2018 grew Enterococcus faecalis. Repeated blood cultures from 09/27/2018 have remained negative. MRI of the lumbar spine and sacral iliac did not show discitis or osteomyelitis. ID has been consulted recommended to narrow empiric antibiotics to ampicillin and Rocephin to cover for endocarditis, as he has a bioprosthetic valve as he is at high risk Diskitis, due to the high suspicion of discitis ID recommended to repeat MRI in a couple of weeks. 6 weeks of IV antibiotic. There were no vegetations on 2D echo. He has remain afebrile. Place a PICC line on 09/30/2018.  AKI (acute kidney injury) (HCC)/acute urinary retention: Secondary to decreased oral intake and ACE inhibitor use and probable urinary retention. His baseline creatinine is around 1.3-1.5. KVO IV fluids.  Essential hypertension: Continue metoprolol continue to hold all other antihypertensive medication.  Hypovolemic hyponatremia: Resolved with IV fluid hydration.  Hypercholesterolemia Continue to hold statins.  History of atherosclerotic cardiovascular disease Continue beta-blockers and  antiplatelet therapy. resume statins.  Normocytic Anemia Anemia panel showed a TIBC of 206 saturation ratio of 6, ferritin of 700. Her ferritin is acting as an acute phase reactant and she will need further evaluation as an outpatient. There are no overt signs of overt bleeding.  Acute thrombocytopenia: [place] due to bacteremia thrombocytopenia has resolved.  Incidental pulmonary nodule Will need to have a repeated CT scan in 3 months as an outpatient. To monitor pulmonary nodule seen on CT scan done on 09/26/2018.  B12 deficiency: Start intramuscular B12.   DVT prophylaxis: lovenox Family Communication:none Disposition Plan/Barrier to D/C: once sepsis improved Code Status:     Code Status Orders  (From admission, onward)         Start     Ordered   09/25/18 1710  Full code  Continuous     09/25/18 1712        Code Status History    Date Active Date Inactive Code Status Order ID Comments User Context   01/10/2015 0947 01/11/2015 2222 Full Code [id number]  [name], MD Inpatient   01/08/2015 2131 01/10/2015 0947 Full Code [id number]  Lavina Hamman, MD Inpatient   06/23/2012 2319 06/25/2012 0055 DNR [id number]  Jaynie Crumble, RN Inpatient        IV Access:    Peripheral IV   Procedures and diagnostic studies:   Mr Thoracic Spine Wo Contrast  Result Date: 09/27/2018 CLINICAL DATA:  Back pain. History of prostate cancer. Sepsis, rule out infection EXAM: MRI THORACIC SPINE WITHOUT CONTRAST TECHNIQUE: Multiplanar, multisequence MR imaging of the thoracic spine was performed. No intravenous contrast was administered. COMPARISON:  Lumbar MRI 09/26/2018.  Chest two-view 09/12/2004 FINDINGS: Alignment:  Normal alignment with moderate kyphosis.  Vertebrae: Negative for fracture or mass. Numerous hemangiomata are present throughout the thoracic vertebra. No bone marrow edema. Cord:  No cord compression.  Cord signal normal throughout. Paraspinal and other soft tissues: Small  bilateral pleural effusions. No paraspinous mass or abscess. Disc levels: Negative for discitis osteomyelitis in the thoracic spine. Multilevel degenerative changes throughout the thoracic spine. No spinal stenosis. Small right-sided disc protrusion T8-9. Small central disc protrusion T7-8. Generous size spinal canal. Bilateral facet degeneration at T10-11. IMPRESSION: No acute abnormality. Multilevel degenerative change without stenosis. Electronically Signed   By: Franchot Gallo M.D.   On: 09/27/2018 12:15     Medical Consultants:    None.  Anti-Infectives:   IV Rocephin  Subjective:    [name] he relates he now has constant  back pain, which narcotics are helping.  Objective:    Vitals:   09/28/18 1654 09/28/18 2059 09/29/18 0509 09/29/18 0857  BP: (!) 151/77 112/60 (!) 144/65 135/66  Pulse: 98 82 92 97  Resp: 20 16 16 18   Temp: 98.9 F (37.2 C) 99.9 F (37.7 C) 97.8 F (36.6 C) 97.9 F (36.6 C)  TempSrc: Oral Oral Oral Oral  SpO2: 95% 95% 94% 94%  Weight:  87.3 kg    Height:        Intake/Output Summary (Last 24 hours) at 09/29/2018 0947 Last data filed at 09/29/2018 0906 Gross per 24 hour  Intake 2512.95 ml  Output 1100 ml  Net 1412.95 ml   Filed Weights   09/26/18 0614 09/28/18 0516 09/28/18 2059  Weight: 90 kg 87.5 kg 87.3 kg    Exam: General exam: In no acute distress. Respiratory system: Good air movement and clear to auscultation. Cardiovascular system: S1 & S2 heard, RRR.  Gastrointestinal system: Abdomen is nondistended, soft and nontender.  Central nervous system: Alert and oriented.  Straight leg raise B/L causes spastic pain Extremities: No pedal edema. Skin: No rashes, lesions or ulcers, no petechial rashes. Psychiatry: Judgement and insight appear normal. Mood & affect appropriate.    Data Reviewed:    Labs: Basic Metabolic Panel: Recent Labs  Lab 09/25/18 2016 09/26/18 0535 09/27/18 0709 09/28/18 0211 09/29/18 0549  NA  134* 135 136 135 135  K 3.7 3.8 3.8 3.9 3.9  CL 102 103 104 102 103  CO2 21* 20* 21* 21* 21*  GLUCOSE 151* 126* 131* 134* 133*  BUN 54* 47* 37* 30* 30*  CREATININE 2.89* 2.50* 2.15* 1.91* 1.87*  CALCIUM 8.2* 8.2* 8.3* 8.2* 8.2*   GFR Estimated Creatinine Clearance: 28.2 mL/min (A) (by C-G formula based on SCr of 1.87 mg/dL (H)). Liver Function Tests: Recent Labs  Lab 09/25/18 1359 09/26/18 0535  AST 61* 45*  ALT 42 34  ALKPHOS 53 47  BILITOT 1.0 0.7  PROT 6.4* 5.3*  ALBUMIN 3.5 3.0*   Recent Labs  Lab 09/25/18 1359  LIPASE 35   No results for input(s): AMMONIA in the last 168 hours. Coagulation profile No results for input(s): INR, PROTIME in the last 168 hours.  CBC: Recent Labs  Lab 09/25/18 1359 09/26/18 0535 09/27/18 0709 09/28/18 0211 09/29/18 0549  WBC 9.2 7.0 8.6 10.4 12.5*  NEUTROABS  --  5.4 6.5 8.0* 9.2*  HGB 10.9* 9.6* 10.2* 9.4* 10.1*  HCT 33.5* 29.4* 30.1* 27.4* 30.0*  MCV 93.1 92.5 92.0 90.4 91.5  PLT 88* 84* 87* 107* 163   Cardiac Enzymes: Recent Labs  Lab 09/25/18 2016 09/27/18 0709  CKTOTAL 333 90   BNP (last  3 results) No results for input(s): PROBNP in the last 8760 hours. CBG: No results for input(s): GLUCAP in the last 168 hours. D-Dimer: No results for input(s): DDIMER in the last 72 hours. Hgb A1c: No results for input(s): HGBA1C in the last 72 hours. Lipid Profile: No results for input(s): CHOL, HDL, LDLCALC, TRIG, CHOLHDL, LDLDIRECT in the last 72 hours. Thyroid function studies: No results for input(s): TSH, T4TOTAL, T3FREE, THYROIDAB in the last 72 hours.  Invalid input(s): FREET3 Anemia work up: No results for input(s): VITAMINB12, FOLATE, FERRITIN, TIBC, IRON, RETICCTPCT in the last 72 hours. Sepsis Labs: Recent Labs  Lab 09/25/18 1359 09/25/18 2016 09/26/18 0535 09/27/18 0709 09/28/18 0211 09/29/18 0549  WBC 9.2  --  7.0 8.6 10.4 12.5*  LATICACIDVEN 2.2* 1.2  --   --   --   --    Microbiology Recent  Results (from the past 240 hour(s))  Culture, Urine     Status: Abnormal   Collection Time: 09/25/18  7:51 PM  Result Value Ref Range Status   Specimen Description URINE, RANDOM  Final   Special Requests   Final    NONE Performed at [facility], [address], [place], [place] [address]    Culture 10,000 COLONIES/mL ENTEROCOCCUS FAECALIS (A)  Final   Report Status 09/27/2018 FINAL  Final   Organism ID, Bacteria ENTEROCOCCUS FAECALIS (A)  Final      Susceptibility   Enterococcus faecalis - MIC*    AMPICILLIN <=2 SENSITIVE Sensitive     LEVOFLOXACIN 1 SENSITIVE Sensitive     NITROFURANTOIN <=16 SENSITIVE Sensitive     VANCOMYCIN 1 SENSITIVE Sensitive     * 10,000 COLONIES/mL ENTEROCOCCUS FAECALIS  Culture, blood (routine x 2)     Status: Abnormal   Collection Time: 09/25/18  8:15 PM  Result Value Ref Range Status   Specimen Description BLOOD LEFT FOREARM  Final   Special Requests   Final    BOTTLES DRAWN AEROBIC AND ANAEROBIC Blood Culture adequate volume   Culture  Setup Time   Final    GRAM POSITIVE COCCI IN BOTH AEROBIC AND ANAEROBIC BOTTLES Organism ID to follow CRITICAL RESULT CALLED TO, READ BACK BY AND VERIFIED WITH: Royann Shivers PharmD 11:50 09/26/18 ([email or web address]) Performed at [facility], [place] [address] [place], [place] [address]    Culture ENTEROCOCCUS FAECALIS (A)  Final   Report Status 09/28/2018 FINAL  Final   Organism ID, Bacteria ENTEROCOCCUS FAECALIS  Final      Susceptibility   Enterococcus faecalis - MIC*    AMPICILLIN <=2 SENSITIVE Sensitive     VANCOMYCIN 1 SENSITIVE Sensitive     GENTAMICIN SYNERGY SENSITIVE Sensitive     * ENTEROCOCCUS FAECALIS  Blood Culture ID Panel (Reflexed)     Status: Abnormal   Collection Time: 09/25/18  8:15 PM  Result Value Ref Range Status   Enterococcus species DETECTED (A) NOT DETECTED Final    Comment: CRITICAL RESULT CALLED TO, READ BACK BY AND VERIFIED WITH: Royann Shivers PharmD 11:50 09/26/18 ([email or web address])     Vancomycin resistance NOT DETECTED NOT DETECTED Final   Listeria monocytogenes NOT DETECTED NOT DETECTED Final   Staphylococcus species NOT DETECTED NOT DETECTED Final   Staphylococcus aureus (BCID) NOT DETECTED NOT DETECTED Final   Streptococcus species NOT DETECTED NOT DETECTED Final   Streptococcus agalactiae NOT DETECTED NOT DETECTED Final   Streptococcus pneumoniae NOT DETECTED NOT DETECTED Final   Streptococcus pyogenes NOT DETECTED NOT DETECTED Final  Acinetobacter baumannii NOT DETECTED NOT DETECTED Final   Enterobacteriaceae species NOT DETECTED NOT DETECTED Final   Enterobacter cloacae complex NOT DETECTED NOT DETECTED Final   Escherichia coli NOT DETECTED NOT DETECTED Final   Klebsiella oxytoca NOT DETECTED NOT DETECTED Final   Klebsiella pneumoniae NOT DETECTED NOT DETECTED Final   Proteus species NOT DETECTED NOT DETECTED Final   Serratia marcescens NOT DETECTED NOT DETECTED Final   Haemophilus influenzae NOT DETECTED NOT DETECTED Final   Neisseria meningitidis NOT DETECTED NOT DETECTED Final   Pseudomonas aeruginosa NOT DETECTED NOT DETECTED Final   Candida albicans NOT DETECTED NOT DETECTED Final   Candida glabrata NOT DETECTED NOT DETECTED Final   Candida krusei NOT DETECTED NOT DETECTED Final   Candida parapsilosis NOT DETECTED NOT DETECTED Final   Candida tropicalis NOT DETECTED NOT DETECTED Final    Comment: Performed at [facility], [place] [address] [place], [place] [address]  Culture, blood (routine x 2)     Status: Abnormal   Collection Time: 09/25/18  8:20 PM  Result Value Ref Range Status   Specimen Description BLOOD LEFT ANTECUBITAL  Final   Special Requests   Final    BOTTLES DRAWN AEROBIC AND ANAEROBIC Blood Culture results may not be optimal due to an inadequate volume of blood received in culture bottles   Culture  Setup Time   Final    GRAM POSITIVE COCCI IN BOTH AEROBIC AND ANAEROBIC BOTTLES CRITICAL VALUE NOTED.  VALUE IS CONSISTENT WITH  PREVIOUSLY REPORTED AND CALLED VALUE.    Culture (A)  Final    ENTEROCOCCUS FAECALIS SUSCEPTIBILITIES PERFORMED ON PREVIOUS CULTURE WITHIN THE LAST 5 DAYS. Performed at [facility], [place] [address], [place], [place] [address]    Report Status 09/28/2018 FINAL  Final  Blood culture (routine x 2)     Status: None (Preliminary result)   Collection Time: 09/27/18 12:25 PM  Result Value Ref Range Status   Specimen Description BLOOD LEFT ANTECUBITAL  Final   Special Requests   Final    BOTTLES DRAWN AEROBIC AND ANAEROBIC Blood Culture adequate volume   Culture   Final    NO GROWTH 2 DAYS Performed at [facility], [place] [address], [place], [place] [address]    Report Status PENDING  Incomplete  Blood culture (routine x 2)     Status: None (Preliminary result)   Collection Time: 09/27/18 12:31 PM  Result Value Ref Range Status   Specimen Description BLOOD RIGHT ANTECUBITAL  Final   Special Requests   Final    BOTTLES DRAWN AEROBIC AND ANAEROBIC Blood Culture adequate volume   Culture   Final    NO GROWTH 2 DAYS Performed at [facility], [place] [address], [place], [place] [address]    Report Status PENDING  Incomplete     Medications:    amLODipine  5 mg Oral Daily   aspirin EC  81 mg Oral Daily   atorvastatin  40 mg Oral q1800   cyanocobalamin  1,000 mcg Intramuscular Daily   cyclobenzaprine  5 mg Oral BID   metoprolol succinate  50 mg Oral Daily   timolol  1 drop Right Eye BID   Continuous Infusions:  sodium chloride 75 mL/hr at 09/28/18 2113   ampicillin (OMNIPEN) IV 2 g (09/29/18 5631)   cefTRIAXone (ROCEPHIN)  IV 2 g (09/29/18 0909)      LOS: 3 days   Charlynne Cousins  [facility]  09/29/2018, 9:47  AM

## 2018-09-30 LAB — CBC WITH DIFFERENTIAL/PLATELET
Abs Immature Granulocytes: 0.18 10*3/uL — ABNORMAL HIGH (ref 0.00–0.07)
Basophils Absolute: 0 10*3/uL (ref 0.0–0.1)
Basophils Relative: 0 %
Eosinophils Absolute: 0.2 10*3/uL (ref 0.0–0.5)
Eosinophils Relative: 1 %
HCT: 27.7 % — ABNORMAL LOW (ref 39.0–52.0)
Hemoglobin: 9.5 g/dL — ABNORMAL LOW (ref 13.0–17.0)
Immature Granulocytes: 2 %
Lymphocytes Relative: 16 %
Lymphs Abs: 1.8 10*3/uL (ref 0.7–4.0)
MCH: 31.6 pg (ref 26.0–34.0)
MCHC: 34.3 g/dL (ref 30.0–36.0)
MCV: 92 fL (ref 80.0–100.0)
Monocytes Absolute: 1 10*3/uL (ref 0.1–1.0)
Monocytes Relative: 9 %
Neutro Abs: 7.8 10*3/uL — ABNORMAL HIGH (ref 1.7–7.7)
Neutrophils Relative %: 72 %
Platelets: 193 10*3/uL (ref 150–400)
RBC: 3.01 MIL/uL — ABNORMAL LOW (ref 4.22–5.81)
RDW: 13.3 % (ref 11.5–15.5)
WBC: 10.9 10*3/uL — ABNORMAL HIGH (ref 4.0–10.5)
nRBC: 0 % (ref 0.0–0.2)

## 2018-09-30 MED ORDER — SODIUM CHLORIDE 0.9% FLUSH: 10.0000 mL | INTRAVENOUS | Status: DC | PRN

## 2018-09-30 MED ORDER — POLYETHYLENE GLYCOL 3350 17 G PO PACK
17.0000 g | PACK | Freq: Once | ORAL | Status: DC
  Filled 2018-09-30 (×2): qty 1

## 2018-09-30 NOTE — Progress Notes (Signed)
Physical Therapy Treatment Patient Details Name: Randy Chen MRN: [id number] DOB: 1927/07/09 Today's Date: 09/30/2018    History of Present Illness Randy Chen is an 85 y.o. male past medical history significant for aortic stenosis status post CABG x5 and 89 prostate cancer hypertension who comes into the emergency department complaining of fever fatigue decreased oral intake, decreased urine output suprapubic and lower back pain that started 4 days prior to admission and worsened last night, he has been prescribed cephalexin by his physician.  Because of  pain and symptoms he went into to urgent care which referred him to the ED    PT Comments    Continuing work on functional mobility and activity tolerance;  Noting MUCH IMPROVED activity tolerance and gait distance!   Overall progressing well; Anticipate continuing good progress at post-acute rehabilitation.    Follow Up Recommendations  SNF     Equipment Recommendations  Rolling walker with 5" wheels    Recommendations for Other Services       Precautions / Restrictions Precautions Precautions: Fall Restrictions Weight Bearing Restrictions: (P) No    Mobility  Bed Mobility                  Transfers Overall transfer level: Needs assistance   Transfers: Sit to/from Stand Sit to Stand: Min guard         General transfer comment: Dependent on UE support, but overall good rise  Ambulation/Gait Ambulation/Gait assistance: Min guard Gait Distance (Feet): 130 Feet Assistive device: Rolling walker (2 wheeled) Gait Pattern/deviations: Step-through pattern;Trunk flexed Gait velocity: slowed   General Gait Details: Cues to self-monitor for activity tolerance; attempted initial walking without RW (he does not typically use one at baseline), but just unsteady enough to need UE support   Stairs             Wheelchair Mobility    Modified Rankin (Stroke Patients Only)       Balance                                            Cognition Arousal/Alertness: Awake/alert Behavior During Therapy: WFL for tasks assessed/performed Overall Cognitive Status: Within Functional Limits for tasks assessed                                        Exercises      General Comments        Pertinent Vitals/Pain Pain Assessment: Faces Faces Pain Scale: Hurts a little bit Pain Location: back and stomach, much better Pain Descriptors / Indicators: Aching;Discomfort;Grimacing Pain Intervention(s): Monitored during session    Home Living                      Prior Function            PT Goals (current goals can now be found in the care plan section) Acute Rehab PT Goals Patient Stated Goal: go to SNF side @ [facility] PT Goal Formulation: With patient Time For Goal Achievement: 10/11/18 Potential to Achieve Goals: Good Progress towards PT goals: Goals met and updated - see care plan    Frequency    Min 3X/week      PT Plan Current plan remains appropriate    Co-evaluation  AM-PAC PT "6 Clicks" Mobility   Outcome Measure  Help needed turning from your back to your side while in a flat bed without using bedrails?: None Help needed moving from lying on your back to sitting on the side of a flat bed without using bedrails?: A Little Help needed moving to and from a bed to a chair (including a wheelchair)?: A Little Help needed standing up from a chair using your arms (e.g., wheelchair or bedside chair)?: A Little Help needed to walk in hospital room?: A Little Help needed climbing 3-5 steps with a railing? : A Little 6 Click Score: 19    End of Session Equipment Utilized During Treatment: Gait belt Activity Tolerance: Patient tolerated treatment well Patient left: with call bell/phone within reach;Other (comment)(in bathroom) Nurse Communication: Mobility status PT Visit Diagnosis: Unsteadiness on feet  (R26.81)     Time: 3810-1751 PT Time Calculation (min) (ACUTE ONLY): 17 min  Charges:  $Gait Training: 8-22 mins                     Roney Marion, PT  Acute Rehabilitation Services Pager [phone] Office [place] 09/30/2018, 2:32 PM

## 2018-09-30 NOTE — Progress Notes (Signed)
Peripherally Inserted Central Catheter/Midline Placement  The IV Nurse has discussed with the patient and/or persons authorized to consent for the patient, the purpose of this procedure and the potential benefits and risks involved with this procedure.  The benefits include less needle sticks, lab draws from the catheter, and the patient may be discharged home with the catheter. Risks include, but not limited to, infection, bleeding, blood clot (thrombus formation), and puncture of an artery; nerve damage and irregular heartbeat and possibility to perform a PICC exchange if needed/ordered by physician.  Alternatives to this procedure were also discussed.  Bard Power PICC patient education guide, fact sheet on infection prevention and patient information card has been provided to patient /or left at bedside.    PICC/Midline Placement Documentation        Darlyn Read 09/30/2018, 10:59 AM

## 2018-09-30 NOTE — Care Management Important Message (Signed)
Important Message  Patient Details  Name: Randy Chen MRN: [id number] Date of Birth: 1927-08-18   Medicare Important Message Given:  Yes    Orbie Pyo 09/30/2018, 4:28 PM

## 2018-09-30 NOTE — Progress Notes (Signed)
[facility] PROGRESS NOTE    Progress Note  Randy Chen  BMW:[id number] DOB: Dec 04, 1927 DOA: 09/25/2018 PCP: [name], MD     Brief Narrative:   Randy Chen is an 85 y.o. male past medical history significant for aortic stenosis status post CABG x5 and 89 prostate cancer hypertension who comes into the emergency department complaining of fever fatigue decreased oral intake, decreased urine output suprapubic and lower back pain that started 4 days prior to admission and worsened last night, she has been prescribed cephalexin by her physician.  Because of her pain and symptoms she went into to urgent care which referred her to the ED.  Assessment/Plan:   Sepsis due to enterococcus bacteremia in the setting of possible prostatic valve endocarditis: Sepsis syndrome is resolved. Urine culture grew enterococcus sensitive to vancomycin and ampicillin. Blood cultures on 09/25/2018 grew Enterococcus faecalis. Repeated blood cultures from 09/27/2018 have remained negative. MRI of the lumbar spine and sacral iliac did not show discitis or osteomyelitis. ID has been consulted recommended to narrow empiric antibiotics to ampicillin and Rocephin to cover for endocarditis, as he has a bioprosthetic valve as he is at high risk Diskitis, due to the high suspicion of discitis ID recommended to repeat MRI in a couple of weeks. ID recommended to transition his antibiotic coverage to ampicillin and Rocephin, and we will treat him for symptoms discitis and endocarditis. There were no vegetations on 2D echo. PICC line to be placed today on 09/30/2018. Therapy has recommended skilled nursing facility.  AKI (acute kidney injury) (HCC)/acute urinary retention: Secondary to decreased oral intake and ACE inhibitor use and probable urinary retention. His baseline creatinine is around 1.3-1.5. KVO IV fluids. His creatinine has returned to baseline.  Essential hypertension: Continue metoprolol,  resume antihypertensive medications as an outpatient.  Hypovolemic hyponatremia: Resolved with IV fluid hydration.  Hypercholesterolemia Continue to hold statins.  History of atherosclerotic cardiovascular disease Continue beta-blockers and antiplatelet therapy. resume statins.  Normocytic Anemia Anemia panel showed a TIBC of 206 saturation ratio of 6, ferritin of 700. Her ferritin is acting as an acute phase reactant and she will need further evaluation as an outpatient. There are no overt signs of overt bleeding.  Acute thrombocytopenia: Likely due to bacteremia thrombocytopenia has resolved.  Incidental pulmonary nodule Will need to have a repeated CT scan in 3 months as an outpatient. To monitor pulmonary nodule seen on CT scan done on 09/26/2018.  B12 deficiency: Start intramuscular B12.   DVT prophylaxis: lovenox Family Communication:none Disposition Plan/Barrier to D/C: once sepsis improved Code Status:     Code Status Orders  (From admission, onward)         Start     Ordered   09/25/18 1710  Full code  Continuous     09/25/18 1712        Code Status History    Date Active Date Inactive Code Status Order ID Comments User Context   01/10/2015 0947 01/11/2015 2222 Full Code [id number]  [name], MD Inpatient   01/08/2015 2131 01/10/2015 0947 Full Code [id number]  Lavina Hamman, MD Inpatient   06/23/2012 2319 06/25/2012 0055 DNR [id number]  Jaynie Crumble, RN Inpatient        IV Access:    Peripheral IV   Procedures and diagnostic studies:   [place] Ekg Site Rite  Result Date: 09/29/2018 If Site Rite image not attached, placement could not be confirmed due to current cardiac rhythm.    Medical Consultants:  None.  Anti-Infectives:   IV Rocephin  Subjective:    Randy Chen relates his back pain is significantly better.  Objective:    Vitals:   09/29/18 1813 09/29/18 2120 09/30/18 0536 09/30/18 0915  BP: 126/61 (!) 146/65 (!)  141/63 (!) 152/68  Pulse: 86 89 79 79  Resp:  20 15 20   Temp: 98.8 F (37.1 C) 98.5 F (36.9 C) 98.2 F (36.8 C) 99.2 F (37.3 C)  TempSrc: Oral  Oral Oral  SpO2: 94% 91% 91% 95%  Weight:  87.6 kg    Height:        Intake/Output Summary (Last 24 hours) at 09/30/2018 0946 Last data filed at 09/30/2018 0543 Gross per 24 hour  Intake 860 ml  Output 1650 ml  Net -790 ml   Filed Weights   09/28/18 0516 09/28/18 2059 09/29/18 2120  Weight: 87.5 kg 87.3 kg 87.6 kg    Exam: General exam: In no acute distress. Respiratory system: Good air movement and clear to auscultation. Cardiovascular system: S1 & S2 heard, RRR.  Gastrointestinal system: Abdomen is nondistended, soft and nontender.  Central nervous system: Alert and oriented.  Nonfocal straight leg raise negative. Extremities: No pedal edema. Skin: No rashes, lesions or ulcers, no petechial rashes. Psychiatry: Judgement and insight appear normal. Mood & affect appropriate.    Data Reviewed:    Labs: Basic Metabolic Panel: Recent Labs  Lab 09/25/18 2016 09/26/18 0535 09/27/18 0709 09/28/18 0211 09/29/18 0549  NA 134* 135 136 135 135  K 3.7 3.8 3.8 3.9 3.9  CL 102 103 104 102 103  CO2 21* 20* 21* 21* 21*  GLUCOSE 151* 126* 131* 134* 133*  BUN 54* 47* 37* 30* 30*  CREATININE 2.89* 2.50* 2.15* 1.91* 1.87*  CALCIUM 8.2* 8.2* 8.3* 8.2* 8.2*   GFR Estimated Creatinine Clearance: 30.4 mL/min (A) (by C-G formula based on SCr of 1.87 mg/dL (H)). Liver Function Tests: Recent Labs  Lab 09/25/18 1359 09/26/18 0535  AST 61* 45*  ALT 42 34  ALKPHOS 53 47  BILITOT 1.0 0.7  PROT 6.4* 5.3*  ALBUMIN 3.5 3.0*   Recent Labs  Lab 09/25/18 1359  LIPASE 35   No results for input(s): AMMONIA in the last 168 hours. Coagulation profile No results for input(s): INR, PROTIME in the last 168 hours.  CBC: Recent Labs  Lab 09/26/18 0535 09/27/18 0709 09/28/18 0211 09/29/18 0549 09/30/18 0513  WBC 7.0 8.6 10.4 12.5*  10.9*  NEUTROABS 5.4 6.5 8.0* 9.2* 7.8*  HGB 9.6* 10.2* 9.4* 10.1* 9.5*  HCT 29.4* 30.1* 27.4* 30.0* 27.7*  MCV 92.5 92.0 90.4 91.5 92.0  PLT 84* 87* 107* 163 193   Cardiac Enzymes: Recent Labs  Lab 09/25/18 2016 09/27/18 0709  CKTOTAL 333 90   BNP (last 3 results) No results for input(s): PROBNP in the last 8760 hours. CBG: No results for input(s): GLUCAP in the last 168 hours. D-Dimer: No results for input(s): DDIMER in the last 72 hours. Hgb A1c: No results for input(s): HGBA1C in the last 72 hours. Lipid Profile: No results for input(s): CHOL, HDL, LDLCALC, TRIG, CHOLHDL, LDLDIRECT in the last 72 hours. Thyroid function studies: No results for input(s): TSH, T4TOTAL, T3FREE, THYROIDAB in the last 72 hours.  Invalid input(s): FREET3 Anemia work up: No results for input(s): VITAMINB12, FOLATE, FERRITIN, TIBC, IRON, RETICCTPCT in the last 72 hours. Sepsis Labs: Recent Labs  Lab 09/25/18 1359 09/25/18 2016  09/27/18 0709 09/28/18 0211 09/29/18 0549 09/30/18 0513  WBC  9.2  --    < > 8.6 10.4 12.5* 10.9*  LATICACIDVEN 2.2* 1.2  --   --   --   --   --    < > = values in this interval not displayed.   Microbiology Recent Results (from the past 240 hour(s))  Culture, Urine     Status: Abnormal   Collection Time: 09/25/18  7:51 PM  Result Value Ref Range Status   Specimen Description URINE, RANDOM  Final   Special Requests   Final    NONE Performed at [facility], [address], [place], [place] [address]    Culture 10,000 COLONIES/mL ENTEROCOCCUS FAECALIS (A)  Final   Report Status 09/27/2018 FINAL  Final   Organism ID, Bacteria ENTEROCOCCUS FAECALIS (A)  Final      Susceptibility   Enterococcus faecalis - MIC*    AMPICILLIN <=2 SENSITIVE Sensitive     LEVOFLOXACIN 1 SENSITIVE Sensitive     NITROFURANTOIN <=16 SENSITIVE Sensitive     VANCOMYCIN 1 SENSITIVE Sensitive     * 10,000 COLONIES/mL ENTEROCOCCUS FAECALIS  Culture, blood (routine x 2)      Status: Abnormal   Collection Time: 09/25/18  8:15 PM  Result Value Ref Range Status   Specimen Description BLOOD LEFT FOREARM  Final   Special Requests   Final    BOTTLES DRAWN AEROBIC AND ANAEROBIC Blood Culture adequate volume   Culture  Setup Time   Final    GRAM POSITIVE COCCI IN BOTH AEROBIC AND ANAEROBIC BOTTLES Organism ID to follow CRITICAL RESULT CALLED TO, READ BACK BY AND VERIFIED WITH: Royann Shivers PharmD 11:50 09/26/18 ([email or web address]) Performed at [facility], [place] [address] [place], [place] [address]    Culture ENTEROCOCCUS FAECALIS (A)  Final   Report Status 09/28/2018 FINAL  Final   Organism ID, Bacteria ENTEROCOCCUS FAECALIS  Final      Susceptibility   Enterococcus faecalis - MIC*    AMPICILLIN <=2 SENSITIVE Sensitive     VANCOMYCIN 1 SENSITIVE Sensitive     GENTAMICIN SYNERGY SENSITIVE Sensitive     * ENTEROCOCCUS FAECALIS  Blood Culture ID Panel (Reflexed)     Status: Abnormal   Collection Time: 09/25/18  8:15 PM  Result Value Ref Range Status   Enterococcus species DETECTED (A) NOT DETECTED Final    Comment: CRITICAL RESULT CALLED TO, READ BACK BY AND VERIFIED WITH: Royann Shivers PharmD 11:50 09/26/18 ([email or web address])    Vancomycin resistance NOT DETECTED NOT DETECTED Final   Listeria monocytogenes NOT DETECTED NOT DETECTED Final   Staphylococcus species NOT DETECTED NOT DETECTED Final   Staphylococcus aureus (BCID) NOT DETECTED NOT DETECTED Final   Streptococcus species NOT DETECTED NOT DETECTED Final   Streptococcus agalactiae NOT DETECTED NOT DETECTED Final   Streptococcus pneumoniae NOT DETECTED NOT DETECTED Final   Streptococcus pyogenes NOT DETECTED NOT DETECTED Final   Acinetobacter baumannii NOT DETECTED NOT DETECTED Final   Enterobacteriaceae species NOT DETECTED NOT DETECTED Final   Enterobacter cloacae complex NOT DETECTED NOT DETECTED Final   Escherichia coli NOT DETECTED NOT DETECTED Final   Klebsiella oxytoca NOT DETECTED NOT DETECTED Final    Klebsiella pneumoniae NOT DETECTED NOT DETECTED Final   Proteus species NOT DETECTED NOT DETECTED Final   Serratia marcescens NOT DETECTED NOT DETECTED Final   Haemophilus influenzae NOT DETECTED NOT DETECTED Final   Neisseria meningitidis NOT DETECTED NOT DETECTED Final   Pseudomonas aeruginosa NOT DETECTED NOT DETECTED Final   Candida albicans NOT  DETECTED NOT DETECTED Final   Candida glabrata NOT DETECTED NOT DETECTED Final   Candida krusei NOT DETECTED NOT DETECTED Final   Candida parapsilosis NOT DETECTED NOT DETECTED Final   Candida tropicalis NOT DETECTED NOT DETECTED Final    Comment: Performed at [facility], [place] [address], [place], [place] [address]  Culture, blood (routine x 2)     Status: Abnormal   Collection Time: 09/25/18  8:20 PM  Result Value Ref Range Status   Specimen Description BLOOD LEFT ANTECUBITAL  Final   Special Requests   Final    BOTTLES DRAWN AEROBIC AND ANAEROBIC Blood Culture results may not be optimal due to an inadequate volume of blood received in culture bottles   Culture  Setup Time   Final    GRAM POSITIVE COCCI IN BOTH AEROBIC AND ANAEROBIC BOTTLES CRITICAL VALUE NOTED.  VALUE IS CONSISTENT WITH PREVIOUSLY REPORTED AND CALLED VALUE.    Culture (A)  Final    ENTEROCOCCUS FAECALIS SUSCEPTIBILITIES PERFORMED ON PREVIOUS CULTURE WITHIN THE LAST 5 DAYS. Performed at [facility], [place] [address], [place], [place] [address]    Report Status 09/28/2018 FINAL  Final  Blood culture (routine x 2)     Status: None (Preliminary result)   Collection Time: 09/27/18 12:25 PM  Result Value Ref Range Status   Specimen Description BLOOD LEFT ANTECUBITAL  Final   Special Requests   Final    BOTTLES DRAWN AEROBIC AND ANAEROBIC Blood Culture adequate volume   Culture   Final    NO GROWTH 2 DAYS Performed at [facility], [place] [address], [place], [place] [address]    Report Status PENDING  Incomplete  Blood culture (routine x 2)     Status:  None (Preliminary result)   Collection Time: 09/27/18 12:31 PM  Result Value Ref Range Status   Specimen Description BLOOD RIGHT ANTECUBITAL  Final   Special Requests   Final    BOTTLES DRAWN AEROBIC AND ANAEROBIC Blood Culture adequate volume   Culture   Final    NO GROWTH 2 DAYS Performed at [facility], [place] [address] [place], [place] [address]    Report Status PENDING  Incomplete     Medications:   . amLODipine  5 mg Oral Daily  . aspirin EC  81 mg Oral Daily  . atorvastatin  40 mg Oral q1800  . cyanocobalamin  1,000 mcg Intramuscular Daily  . cyclobenzaprine  5 mg Oral BID  . metoprolol succinate  50 mg Oral Daily  . timolol  1 drop Right Eye BID   Continuous Infusions: . ampicillin (OMNIPEN) IV 2 g (09/30/18 0543)  . cefTRIAXone (ROCEPHIN)  IV 2 g (09/29/18 2239)      LOS: 4 days   Charlynne Cousins  [facility]  09/30/2018, 9:46 AM

## 2018-10-01 NOTE — Progress Notes (Signed)
Physical Therapy Treatment Patient Details Name: ICHOLAS IRBY MRN: [id number] DOB: 1927-06-27 Today's Date: 10/01/2018    History of Present Illness DEO MEHRINGER is an 85 y.o. male past medical history significant for aortic stenosis status post CABG x5 and 89 prostate cancer hypertension who comes into the emergency department complaining of fever fatigue decreased oral intake, decreased urine output suprapubic and lower back pain that started 4 days prior to admission and worsened last night, he has been prescribed cephalexin by his physician.  Because of  pain and symptoms he went into to urgent care which referred him to the ED    PT Comments    Pt making progress today with gait distance, 200', and speed. However, continues to need RW for stability and min A at times for imbalance and poor safety awareness with RW. PT will continue to follow.    Follow Up Recommendations  SNF     Equipment Recommendations  Rolling walker with 5" wheels    Recommendations for Other Services       Precautions / Restrictions Precautions Precautions: Fall Restrictions Weight Bearing Restrictions: No    Mobility  Bed Mobility Overal bed mobility: Needs Assistance Bed Mobility: Supine to Sit     Supine to sit: Min guard     General bed mobility comments: pt had some difficulty problem solving to get to EOB, kept reaching behind himself to push from opposite rail without success. vc's for reaching across body to fwd rail and pt was able to get to edge with increased time and supervision  Transfers Overall transfer level: Needs assistance Equipment used: Rolling walker (2 wheeled) Transfers: Sit to/from Stand Sit to Stand: Min guard         General transfer comment: Dependent on UE support, but overall good rise, had noted tremor that improved with mobility  Ambulation/Gait Ambulation/Gait assistance: Min guard;Min assist Gait Distance (Feet): 200 Feet Assistive device:  Rolling walker (2 wheeled) Gait Pattern/deviations: Step-through pattern;Trunk flexed Gait velocity: slowed Gait velocity interpretation: <1.8 ft/sec, indicate of risk for recurrent falls General Gait Details: occasionally had trouble guiding RW and keeping feet inside, min A and vc's given at these times for safety, otherwise min-guard with the RW.    Stairs             Wheelchair Mobility    Modified Rankin (Stroke Patients Only)       Balance Overall balance assessment: Needs assistance Sitting-balance support: Single extremity supported Sitting balance-Leahy Scale: Fair     Standing balance support: Bilateral upper extremity supported Standing balance-Leahy Scale: Poor Standing balance comment: unsteady without UE support                            Cognition Arousal/Alertness: Awake/alert Behavior During Therapy: WFL for tasks assessed/performed Overall Cognitive Status: Within Functional Limits for tasks assessed                                        Exercises      General Comments        Pertinent Vitals/Pain Pain Assessment: No/denies pain    Home Living                      Prior Function            PT Goals (current goals  can now be found in the care plan section) Acute Rehab PT Goals Patient Stated Goal: go to SNF side @ [facility] PT Goal Formulation: With patient Time For Goal Achievement: 10/11/18 Potential to Achieve Goals: Good Progress towards PT goals: Progressing toward goals    Frequency    Min 3X/week      PT Plan Current plan remains appropriate    Co-evaluation              AM-PAC PT "6 Clicks" Mobility   Outcome Measure  Help needed turning from your back to your side while in a flat bed without using bedrails?: A Little Help needed moving from lying on your back to sitting on the side of a flat bed without using bedrails?: A Little Help needed moving to and from a bed to  a chair (including a wheelchair)?: A Little Help needed standing up from a chair using your arms (e.g., wheelchair or bedside chair)?: A Little Help needed to walk in hospital room?: A Little Help needed climbing 3-5 steps with a railing? : A Little 6 Click Score: 18    End of Session Equipment Utilized During Treatment: Gait belt Activity Tolerance: Patient tolerated treatment well Patient left: with call bell/phone within reach;in chair;with chair alarm set Nurse Communication: Mobility status PT Visit Diagnosis: Unsteadiness on feet (R26.81)     Time: 3568-6168 PT Time Calculation (min) (ACUTE ONLY): 22 min  Charges:  $Gait Training: 8-22 mins                     Leighton Roach, [place]  Pager [phone] [place] 10/01/2018, 12:18 PM

## 2018-10-01 NOTE — Progress Notes (Signed)
[facility] PROGRESS NOTE    Progress Note  Randy Chen  NWG:[id number] DOB: [birth year] DOA: 09/25/2018 PCP: [name], MD     Brief Narrative:   Randy Chen is an 85 y.o. male past medical history significant for aortic stenosis status post CABG x5 and 89 prostate cancer hypertension who comes into the emergency department complaining of fever fatigue decreased oral intake, decreased urine output suprapubic and lower back pain that started 4 days prior to admission and worsened last night, she has been prescribed cephalexin by her physician.  Because of her spasms pain and symptoms she went into to urgent care which referred her to the ED. Start empirically on the sepsis pathway as he was having fevers and elevated white blood cell count, for possible UTI urine culture grew enterococcus as well as blood cultures.  Blood cultures from 09/27/2018 remain negative. Infectious disease was consulted recommended an MRI that did not show discitis or osteomyelitis.  He is currently on IV Rocephin and ampicillin.  We are waiting insurance approval for skilled nursing facility at [facility] burn as physical therapy evaluated him and recommended skilled nursing facility as well as for IV antibiotics.   Assessment/Plan:   Sepsis due to enterococcus bacteremia in the setting of possible prostatic valve endocarditis: Sepsis syndrome is resolved. Urine culture grew enterococcus sensitive to vancomycin and ampicillin. Blood cultures on 09/25/2018 grew Enterococcus faecalis. Repeated blood cultures from 09/27/2018 have remained negative. MRI of the lumbar spine and sacral iliac did not show discitis or osteomyelitis. ID was consulted recommended to narrow empiric antibiotics to ampicillin and Rocephin to cover for endocarditis, as he has a bioprosthetic valve as he is at high risk Diskitis, due to the high suspicion of discitis ID recommended to repeat MRI in a couple of weeks. ID recommended to  transition his antibiotic coverage to ampicillin and Rocephin, and we will treat him for symptoms discitis and endocarditis, for 6 weeks start date is 09/27/2018 There were no vegetations on 2D echo. PICC line  placed  on 09/30/2018. Physical therapy has recommended skilled nursing facility. Awaiting insurance approval.  AKI (acute kidney injury) (HCC)/acute urinary retention: Secondary to decreased oral intake and ACE inhibitor use and probable urinary retention. His baseline creatinine is around 1.3-1.5. His creatinine has returned to baseline. Discontinue Foley.  Essential hypertension: Continue metoprolol, resume antihypertensive home regimen medications as an outpatient.  Hypovolemic hyponatremia: Resolved with IV fluid hydration.  Hypercholesterolemia Continue to hold statins.  History of atherosclerotic cardiovascular disease Continue beta-blockers and antiplatelet therapy. resume statins.  Normocytic Anemia Anemia panel showed a TIBC of 206 saturation ratio of 6, ferritin of 700. Her ferritin is acting as an acute phase reactant and she will need further evaluation as an outpatient. There are no overt signs of overt bleeding in the hospital.  Acute thrombocytopenia: Likely due to bacteremia thrombocytopenia has resolved.  Incidental pulmonary nodule Will need to have a repeated CT scan in 3 months as an outpatient. To monitor pulmonary nodule seen on CT scan done on 09/26/2018.  B12 deficiency: Repleted intramuscularly in house.  He will complete a 7-day course of intramuscular B12 on 10/02/2018.   DVT prophylaxis: lovenox Family Communication:none Disposition Plan/Barrier to D/C: Skilled nursing facility within 24 to 48 hours, awaiting insurance approval. Code Status:     Code Status Orders  (From admission, onward)         Start     Ordered   09/25/18 1710  Full code  Continuous  09/25/18 1712        Code Status History    Date Active Date Inactive  Code Status Order ID Comments User Context   01/10/2015 0947 01/11/2015 2222 Full Code [id number]  [name], MD Inpatient   01/08/2015 2131 01/10/2015 0947 Full Code [id number]  Lavina Hamman, MD Inpatient   06/23/2012 2319 06/25/2012 0055 DNR [id number]  Jaynie Crumble, RN Inpatient        IV Access:    Peripheral IV   Procedures and diagnostic studies:   [place] Ekg Site Rite  Result Date: 09/29/2018 If Site Rite image not attached, placement could not be confirmed due to current cardiac rhythm.    Medical Consultants:    None.  Anti-Infectives:   IV Rocephin  Subjective:    Randy Chen relates his back pain is significantly better.  Objective:    Vitals:   09/30/18 1716 09/30/18 1949 10/01/18 0518 10/01/18 0829  BP: 135/71 (!) 149/66 138/70 (!) 145/69  Pulse: 85 78 78 79  Resp: 18 18 18 19   Temp: 98.4 F (36.9 C) 98.4 F (36.9 C) 98.6 F (37 C) 98 F (36.7 C)  TempSrc: Axillary Oral Oral Oral  SpO2: 96% 93% 98% 100%  Weight:      Height:        Intake/Output Summary (Last 24 hours) at 10/01/2018 0947 Last data filed at 10/01/2018 7425 Gross per 24 hour  Intake 1498.81 ml  Output 1525 ml  Net -26.19 ml   Filed Weights   09/28/18 0516 09/28/18 2059 09/29/18 2120  Weight: 87.5 kg 87.3 kg 87.6 kg    Exam: General exam: In no acute distress. Respiratory system: Good air movement and clear to auscultation. Cardiovascular system: S1 & S2 heard, RRR.  Gastrointestinal system: Abdomen is nondistended, soft and nontender.  Central nervous system: Alert and oriented.  Nonfocal straight leg raise negative. Extremities: No pedal edema. Skin: No rashes, lesions or ulcers, no petechial rashes. Psychiatry: Judgement and insight appear normal. Mood & affect appropriate.    Data Reviewed:    Labs: Basic Metabolic Panel: Recent Labs  Lab 09/25/18 2016 09/26/18 0535 09/27/18 0709 09/28/18 0211 09/29/18 0549  NA 134* 135 136 135 135  K 3.7 3.8 3.8  3.9 3.9  CL 102 103 104 102 103  CO2 21* 20* 21* 21* 21*  GLUCOSE 151* 126* 131* 134* 133*  BUN 54* 47* 37* 30* 30*  CREATININE 2.89* 2.50* 2.15* 1.91* 1.87*  CALCIUM 8.2* 8.2* 8.3* 8.2* 8.2*   GFR Estimated Creatinine Clearance: 30.4 mL/min (A) (by C-G formula based on SCr of 1.87 mg/dL (H)). Liver Function Tests: Recent Labs  Lab 09/25/18 1359 09/26/18 0535  AST 61* 45*  ALT 42 34  ALKPHOS 53 47  BILITOT 1.0 0.7  PROT 6.4* 5.3*  ALBUMIN 3.5 3.0*   Recent Labs  Lab 09/25/18 1359  LIPASE 35   No results for input(s): AMMONIA in the last 168 hours. Coagulation profile No results for input(s): INR, PROTIME in the last 168 hours.  CBC: Recent Labs  Lab 09/26/18 0535 09/27/18 0709 09/28/18 0211 09/29/18 0549 09/30/18 0513  WBC 7.0 8.6 10.4 12.5* 10.9*  NEUTROABS 5.4 6.5 8.0* 9.2* 7.8*  HGB 9.6* 10.2* 9.4* 10.1* 9.5*  HCT 29.4* 30.1* 27.4* 30.0* 27.7*  MCV 92.5 92.0 90.4 91.5 92.0  PLT 84* 87* 107* 163 193   Cardiac Enzymes: Recent Labs  Lab 09/25/18 2016 09/27/18 0709  CKTOTAL 333 90   BNP (last  3 results) No results for input(s): PROBNP in the last 8760 hours. CBG: No results for input(s): GLUCAP in the last 168 hours. D-Dimer: No results for input(s): DDIMER in the last 72 hours. Hgb A1c: No results for input(s): HGBA1C in the last 72 hours. Lipid Profile: No results for input(s): CHOL, HDL, LDLCALC, TRIG, CHOLHDL, LDLDIRECT in the last 72 hours. Thyroid function studies: No results for input(s): TSH, T4TOTAL, T3FREE, THYROIDAB in the last 72 hours.  Invalid input(s): FREET3 Anemia work up: No results for input(s): VITAMINB12, FOLATE, FERRITIN, TIBC, IRON, RETICCTPCT in the last 72 hours. Sepsis Labs: Recent Labs  Lab 09/25/18 1359 09/25/18 2016  09/27/18 0709 09/28/18 0211 09/29/18 0549 09/30/18 0513  WBC 9.2  --    < > 8.6 10.4 12.5* 10.9*  LATICACIDVEN 2.2* 1.2  --   --   --   --   --    < > = values in this interval not displayed.    Microbiology Recent Results (from the past 240 hour(s))  Culture, Urine     Status: Abnormal   Collection Time: 09/25/18  7:51 PM  Result Value Ref Range Status   Specimen Description URINE, RANDOM  Final   Special Requests   Final    NONE Performed at [facility], [address] [place], [place] [address]    Culture 10,000 COLONIES/mL ENTEROCOCCUS FAECALIS (A)  Final   Report Status 09/27/2018 FINAL  Final   Organism ID, Bacteria ENTEROCOCCUS FAECALIS (A)  Final      Susceptibility   Enterococcus faecalis - MIC*    AMPICILLIN <=2 SENSITIVE Sensitive     LEVOFLOXACIN 1 SENSITIVE Sensitive     NITROFURANTOIN <=16 SENSITIVE Sensitive     VANCOMYCIN 1 SENSITIVE Sensitive     * 10,000 COLONIES/mL ENTEROCOCCUS FAECALIS  Culture, blood (routine x 2)     Status: Abnormal   Collection Time: 09/25/18  8:15 PM  Result Value Ref Range Status   Specimen Description BLOOD LEFT FOREARM  Final   Special Requests   Final    BOTTLES DRAWN AEROBIC AND ANAEROBIC Blood Culture adequate volume   Culture  Setup Time   Final    GRAM POSITIVE COCCI IN BOTH AEROBIC AND ANAEROBIC BOTTLES Organism ID to follow CRITICAL RESULT CALLED TO, READ BACK BY AND VERIFIED WITH: Royann Shivers PharmD 11:50 09/26/18 ([email or web address]) Performed at [facility], [place] [address] [place], [place] [address]    Culture ENTEROCOCCUS FAECALIS (A)  Final   Report Status 09/28/2018 FINAL  Final   Organism ID, Bacteria ENTEROCOCCUS FAECALIS  Final      Susceptibility   Enterococcus faecalis - MIC*    AMPICILLIN <=2 SENSITIVE Sensitive     VANCOMYCIN 1 SENSITIVE Sensitive     GENTAMICIN SYNERGY SENSITIVE Sensitive     * ENTEROCOCCUS FAECALIS  Blood Culture ID Panel (Reflexed)     Status: Abnormal   Collection Time: 09/25/18  8:15 PM  Result Value Ref Range Status   Enterococcus species DETECTED (A) NOT DETECTED Final    Comment: CRITICAL RESULT CALLED TO, READ BACK BY AND VERIFIED WITH: Royann Shivers PharmD 11:50 09/26/18  ([email or web address])    Vancomycin resistance NOT DETECTED NOT DETECTED Final   Listeria monocytogenes NOT DETECTED NOT DETECTED Final   Staphylococcus species NOT DETECTED NOT DETECTED Final   Staphylococcus aureus (BCID) NOT DETECTED NOT DETECTED Final   Streptococcus species NOT DETECTED NOT DETECTED Final   Streptococcus agalactiae NOT DETECTED NOT DETECTED Final  Streptococcus pneumoniae NOT DETECTED NOT DETECTED Final   Streptococcus pyogenes NOT DETECTED NOT DETECTED Final   Acinetobacter baumannii NOT DETECTED NOT DETECTED Final   Enterobacteriaceae species NOT DETECTED NOT DETECTED Final   Enterobacter cloacae complex NOT DETECTED NOT DETECTED Final   Escherichia coli NOT DETECTED NOT DETECTED Final   Klebsiella oxytoca NOT DETECTED NOT DETECTED Final   Klebsiella pneumoniae NOT DETECTED NOT DETECTED Final   Proteus species NOT DETECTED NOT DETECTED Final   Serratia marcescens NOT DETECTED NOT DETECTED Final   Haemophilus influenzae NOT DETECTED NOT DETECTED Final   Neisseria meningitidis NOT DETECTED NOT DETECTED Final   Pseudomonas aeruginosa NOT DETECTED NOT DETECTED Final   Candida albicans NOT DETECTED NOT DETECTED Final   Candida glabrata NOT DETECTED NOT DETECTED Final   Candida krusei NOT DETECTED NOT DETECTED Final   Candida parapsilosis NOT DETECTED NOT DETECTED Final   Candida tropicalis NOT DETECTED NOT DETECTED Final    Comment: Performed at [facility], [place] [address] [place], [place] [address]  Culture, blood (routine x 2)     Status: Abnormal   Collection Time: 09/25/18  8:20 PM  Result Value Ref Range Status   Specimen Description BLOOD LEFT ANTECUBITAL  Final   Special Requests   Final    BOTTLES DRAWN AEROBIC AND ANAEROBIC Blood Culture results may not be optimal due to an inadequate volume of blood received in culture bottles   Culture  Setup Time   Final    GRAM POSITIVE COCCI IN BOTH AEROBIC AND ANAEROBIC BOTTLES CRITICAL VALUE NOTED.  VALUE IS  CONSISTENT WITH PREVIOUSLY REPORTED AND CALLED VALUE.    Culture (A)  Final    ENTEROCOCCUS FAECALIS SUSCEPTIBILITIES PERFORMED ON PREVIOUS CULTURE WITHIN THE LAST 5 DAYS. Performed at [facility], [place] [address], [place], [place] [address]    Report Status 09/28/2018 FINAL  Final  Blood culture (routine x 2)     Status: None (Preliminary result)   Collection Time: 09/27/18 12:25 PM  Result Value Ref Range Status   Specimen Description BLOOD LEFT ANTECUBITAL  Final   Special Requests   Final    BOTTLES DRAWN AEROBIC AND ANAEROBIC Blood Culture adequate volume   Culture  Setup Time   Final    AEROBIC BOTTLE ONLY GRAM POSITIVE COCCI CRITICAL VALUE NOTED.  VALUE IS CONSISTENT WITH PREVIOUSLY REPORTED AND CALLED VALUE.    Culture   Final    GRAM POSITIVE COCCI IDENTIFICATION TO FOLLOW CULTURE REINCUBATED FOR BETTER GROWTH Performed at [facility], [place] [address], [place], [place] [address]    Report Status PENDING  Incomplete  Blood culture (routine x 2)     Status: None (Preliminary result)   Collection Time: 09/27/18 12:31 PM  Result Value Ref Range Status   Specimen Description BLOOD RIGHT ANTECUBITAL  Final   Special Requests   Final    BOTTLES DRAWN AEROBIC AND ANAEROBIC Blood Culture adequate volume   Culture   Final    NO GROWTH 3 DAYS Performed at [facility], [place] [address], [place], [place] [address]    Report Status PENDING  Incomplete     Medications:   . amLODipine  5 mg Oral Daily  . aspirin EC  81 mg Oral Daily  . atorvastatin  40 mg Oral q1800  . cyanocobalamin  1,000 mcg Intramuscular Daily  . cyclobenzaprine  5 mg Oral BID  . metoprolol succinate  50 mg Oral Daily  .  polyethylene glycol  17 g Oral Once  . timolol  1 drop Right Eye BID   Continuous Infusions: . ampicillin (OMNIPEN) IV 2 g (10/01/18 5027)  . cefTRIAXone (ROCEPHIN)  IV 2 g (10/01/18 7412)      LOS: 5 days   Charlynne Cousins  [facility]   10/01/2018, 9:47 AM

## 2018-10-01 NOTE — TOC Progression Note (Signed)
Transition of Care [facility]) - Progression Note    Patient Details  Name: Randy Chen MRN: [id number] Date of Birth: Sep 06, 1927  [facility] CM/SW Contact  Sharlet Salina Mila Homer, LCSW Phone Number: 10/01/2018, 3:46 PM  Clinical Narrative:  CSW talked with patient earlier today regarding his discharge disposition. Confirmed that [name] and his wife live in an apartment at [place]. Patient does plan to discharge to the nursing facility for ST rehab. Explained to patient the process involved in getting insurance authorization and what is required (PT/OT evaluations & progress notes). Talked with admissions director, Jerilynn Som regarding patient and initiating the authorization process, pending an OT eval (PT had seen). Contacted [employer] (3:25 pm) and talked with Danae Chen regarding patient. Danae Chen gathered information regarding patient to initiate request and advised her that OT has not evaluated patient yet. Danae Chen provided CSW with Reference [phone] and recommended that [place] fax the information I have and send OT evaluation once completed.  Expected Discharge Plan: [place] Barriers to Discharge: Continued Medical Work up  Expected Discharge Plan and Services Expected Discharge Plan: [facility] In-house Referral: Clinical Social Work Discharge Planning Services: NA Post Acute Care Choice: [place] Living arrangements for the past 2 months: [place]                 DME Arranged: N/A DME Agency: NA HH Arranged: NA HH Agency: NA   Social Determinants of Health (SDOH) Interventions None needed at this time.    Readmission Risk Interventions Readmission Risk Prevention Plan 09/28/2018  Transportation Screening Complete  PCP or Specialist Appt within 5-7 Days Complete  Home Care Screening Complete  Medication Review (RN CM) Complete  Some recent data might be hidden

## 2018-10-02 DIAGNOSIS — B952 Enterococcus as the cause of diseases classified elsewhere: Secondary | ICD-10-CM | POA: Diagnosis not present

## 2018-10-02 DIAGNOSIS — R41841 Cognitive communication deficit: Secondary | ICD-10-CM | POA: Diagnosis not present

## 2018-10-02 DIAGNOSIS — N179 Acute kidney failure, unspecified: Secondary | ICD-10-CM | POA: Diagnosis not present

## 2018-10-02 DIAGNOSIS — Z743 Need for continuous supervision: Secondary | ICD-10-CM | POA: Diagnosis not present

## 2018-10-02 DIAGNOSIS — Z792 Long term (current) use of antibiotics: Secondary | ICD-10-CM | POA: Diagnosis not present

## 2018-10-02 DIAGNOSIS — E871 Hypo-osmolality and hyponatremia: Secondary | ICD-10-CM | POA: Diagnosis not present

## 2018-10-02 DIAGNOSIS — R339 Retention of urine, unspecified: Secondary | ICD-10-CM | POA: Diagnosis not present

## 2018-10-02 DIAGNOSIS — M6281 Muscle weakness (generalized): Secondary | ICD-10-CM | POA: Diagnosis not present

## 2018-10-02 DIAGNOSIS — R2689 Other abnormalities of gait and mobility: Secondary | ICD-10-CM | POA: Diagnosis not present

## 2018-10-02 DIAGNOSIS — R5381 Other malaise: Secondary | ICD-10-CM | POA: Diagnosis not present

## 2018-10-02 DIAGNOSIS — I10 Essential (primary) hypertension: Secondary | ICD-10-CM | POA: Diagnosis not present

## 2018-10-02 DIAGNOSIS — M545 Low back pain: Secondary | ICD-10-CM | POA: Diagnosis not present

## 2018-10-02 DIAGNOSIS — R531 Weakness: Secondary | ICD-10-CM | POA: Diagnosis not present

## 2018-10-02 DIAGNOSIS — N390 Urinary tract infection, site not specified: Secondary | ICD-10-CM | POA: Diagnosis not present

## 2018-10-02 DIAGNOSIS — D649 Anemia, unspecified: Secondary | ICD-10-CM | POA: Diagnosis not present

## 2018-10-02 DIAGNOSIS — R2681 Unsteadiness on feet: Secondary | ICD-10-CM | POA: Diagnosis not present

## 2018-10-02 DIAGNOSIS — Z5181 Encounter for therapeutic drug level monitoring: Secondary | ICD-10-CM | POA: Diagnosis not present

## 2018-10-02 DIAGNOSIS — R600 Localized edema: Secondary | ICD-10-CM | POA: Diagnosis not present

## 2018-10-02 DIAGNOSIS — I739 Peripheral vascular disease, unspecified: Secondary | ICD-10-CM | POA: Diagnosis not present

## 2018-10-02 DIAGNOSIS — I2510 Atherosclerotic heart disease of native coronary artery without angina pectoris: Secondary | ICD-10-CM | POA: Diagnosis not present

## 2018-10-02 DIAGNOSIS — R7881 Bacteremia: Secondary | ICD-10-CM | POA: Diagnosis not present

## 2018-10-02 DIAGNOSIS — R279 Unspecified lack of coordination: Secondary | ICD-10-CM | POA: Diagnosis not present

## 2018-10-02 DIAGNOSIS — E538 Deficiency of other specified B group vitamins: Secondary | ICD-10-CM | POA: Diagnosis not present

## 2018-10-02 DIAGNOSIS — E785 Hyperlipidemia, unspecified: Secondary | ICD-10-CM | POA: Diagnosis not present

## 2018-10-02 DIAGNOSIS — Z452 Encounter for adjustment and management of vascular access device: Secondary | ICD-10-CM | POA: Diagnosis not present

## 2018-10-02 LAB — BASIC METABOLIC PANEL
Anion gap: 10 (ref 5–15)
BUN: 24 mg/dL — ABNORMAL HIGH (ref 8–23)
CO2: 24 mmol/L (ref 22–32)
Calcium: 8.1 mg/dL — ABNORMAL LOW (ref 8.9–10.3)
Chloride: 100 mmol/L (ref 98–111)
Creatinine, Ser: 1.73 mg/dL — ABNORMAL HIGH (ref 0.61–1.24)
GFR calc Af Amer: 40 mL/min — ABNORMAL LOW (ref >60)
GFR calc non Af Amer: 34 mL/min — ABNORMAL LOW (ref >60)
Glucose, Bld: 111 mg/dL — ABNORMAL HIGH (ref 70–99)
Potassium: 3.6 mmol/L (ref 3.5–5.1)
Sodium: 134 mmol/L — ABNORMAL LOW (ref 135–145)

## 2018-10-02 MED ORDER — CYCLOBENZAPRINE HCL 5 MG PO TABS: 5.0000 mg | ORAL_TABLET | Freq: Two times a day (BID) | ORAL | 0 refills | Status: DC

## 2018-10-02 MED ORDER — AMPICILLIN IV (FOR PTA / DISCHARGE USE ONLY): 2.0000 g | Freq: Three times a day (TID) | INTRAVENOUS | 0 refills | Status: DC

## 2018-10-02 MED ORDER — HYDROCODONE-ACETAMINOPHEN 5-325 MG PO TABS: 1.0000 | ORAL_TABLET | ORAL | 0 refills | Status: DC | PRN

## 2018-10-02 MED ORDER — CEFTRIAXONE IV (FOR PTA / DISCHARGE USE ONLY): 2.0000 g | Freq: Two times a day (BID) | INTRAVENOUS | 0 refills | Status: DC

## 2018-10-02 NOTE — Discharge Summary (Signed)
Physician Discharge Summary  Randy Chen KVQ:[id number] DOB: 10-30-1927 DOA: 09/25/2018  PCP: [name], MD  Admit date: 09/25/2018 Discharge date: 10/02/2018  Recommendations for Outpatient Follow-up:  1. Patient will have an MRI as outpatient as he is at high risk for diskitis due to his enterococcal bacteremia 2. 6 weeks IV antibiotics as per infectious disease.   3. Follow up with PCP in 7-10 days after discharge from SNF.  4. He will have a phone Visit with [facility] in one month.  5. Pt will be discharged to SNF.  Discharge Diagnoses: Principal diagnosis is #1 1. Enterococcal bacteremia with concern for diskitis and/or possible prosthetic valve endocarditis. 2. AKI 3. Hypertension 4. S/P Bioprosthetic aortic valve replacement 5. Urinary retention 6. Hyponatremia 7. Acute lower UTI 8. Hyperlipidemia 9. Pulmonary nodule  Discharge Condition: Fair Disposition: SNF for rehab  Diet recommendation: Heart healthy  Filed Weights   09/28/18 2059 09/29/18 2120 10/01/18 2035  Weight: 87.3 kg 87.6 kg 90.3 kg    History of present illness:  Randy Chen is a 85 y.o. male with medical history significant of aortic stenosis, CAD/CABG x5 in 1987, PAD, prostate CA, prostatectomy, hyperlipidemia, hypertension who is coming to the emergency department with complaints of fever, chills, fatigue, decreased oral intake, oliguria with dark amber color urine, suprapubic and lower back pain since about 4 days, but has particularly worsened since last night.  He stated that he was prescribed cephalexin by his doctor.  However, he was sent to an urgent care center due to worsening of her symptoms since last night, but then was subsequently referred to the emergency department.  He denies chest pain, dyspnea, palpitations, dizziness, diaphoresis, PND, orthopnea or pitting edema of the lower extremities.  Denies nausea, emesis, diarrhea, constipation, melena or hematochezia.  He denies polyuria,  polydipsia, polyphagia or blurred vision.   Hospital Course:  Randy Chen is an 85 y.o. male past medical history significant for aortic stenosis status post CABG x5 and 89 prostate cancer hypertension who comes into the emergency department complaining of fever fatigue decreased oral intake, decreased urine output suprapubic and lower back pain that started 4 days prior to admission and worsened last night, she has been prescribed cephalexin by her physician.  Because of her spasms pain and symptoms she went into to urgent care which referred her to the ED. Start empirically on the sepsis pathway as he was having fevers and elevated white blood cell count, for possible UTI urine culture grew enterococcus as well as blood cultures.  Blood cultures from 09/27/2018 remain negative. Infectious disease was consulted recommended an MRI that did not show discitis or osteomyelitis.  He is currently on IV Rocephin and ampicillin.  The patient has been approved by insurance for skilled nursing facility at [facility] as physical therapy evaluated him and recommended skilled nursing facility as well as for IV antibiotics. Infectious disease has ordered IV Rocephin and ampicillin for 6 weeks. They will follow up with the patient by phone in one month. The patient will have an MRI scheduled as outpatient in a couple of weeks as recommended by infectious disease out of concern for the patient's high risk of endocarditis and /or discitis given his bioprosthetic valve. He is being treated as though he did have endocarditis.  Today's assessment: S: The patient is sitting up at bedside. No new complaints. O: Vitals:  Vitals:   10/02/18 0500 10/02/18 0849  BP: 138/80 132/85  Pulse: 98 85  Resp: 18 20  Temp: 98.4 F (36.9 C) 98.1 F (36.7 C)  SpO2: 96% 98%    Constitutional:  The patient is awake, alert, and oriented x 3. No acute distress. Respiratory:   No wheezes, rales, or rhonchi.  No tactile  fremitus  No increased work of breathing. Cardiovascular:   Regular rate and rhythm  No murmurs ectopy, or gallups.  No LE extremity edema    Normal pedal pulses Abdomen:   Abdomen is soft, non-tender, non-distended.  No hernias, masses, or organomegaly are appreciated.  Normoactive bowel sounds. Musculoskeletal:   No cyanosis, clubbing or edema Skin:   No rashes, lesions, ulcers  palpation of skin: no induration or nodules Neurologic:   CN 2-12 intact  Sensation all 4 extremities intact Psychiatric:   judgement and insight appear normal  Mental status o Mood, affect appropriate o Orientation to person, place, time   Discharge Instructions  Discharge Instructions    Activity as tolerated - No restrictions   Complete by:  As directed    Call MD for:  difficulty breathing, headache or visual disturbances   Complete by:  As directed    Call MD for:  persistant nausea and vomiting   Complete by:  As directed    Call MD for:  severe uncontrolled pain   Complete by:  As directed    Call MD for:  temperature >100.4   Complete by:  As directed    Diet - low sodium heart healthy   Complete by:  As directed    Home infusion instructions [place] May follow [place] Dosing Protocol; May administer Cathflo as needed to maintain patency of vascular access device.; Flushing of vascular access device: per [facility] Protocol: 0.9% NaCl pre/post medica...   Complete by:  As directed    Instructions:  May follow [place] Dosing Protocol   Instructions:  May administer Cathflo as needed to maintain patency of vascular access device.   Instructions:  Flushing of vascular access device: per [facility] Protocol: 0.9% NaCl pre/post medication administration and prn patency; Heparin 100 u/ml, 28m for implanted ports and Heparin 10u/ml, 513mfor all other central venous catheters.   Instructions:  May follow [facility] Anaphylaxis Protocol for First Dose Administration in the home:  0.9% NaCl at 25-50 ml/hr to maintain IV access for protocol meds. Epinephrine 0.3 ml IV/IM PRN and Benadryl 25-50 IV/IM PRN s/s of anaphylaxis.   Instructions:  [facility] Coordinator (RN) to assist per patient IV care needs in the home PRN.   Increase activity slowly   Complete by:  As directed      Allergies as of 10/02/2018   No Known Allergies     Medication List    STOP taking these medications   cephALEXin 500 MG capsule Commonly known as:  KEFLEX   GLUCOSAMINE 1500 COMPLEX PO   lisinopril 40 MG tablet Commonly known as:  PRINIVIL,ZESTRIL     TAKE these medications   amLODipine 5 MG tablet Commonly known as:  NORVASC TAKE 1 TABLET BY MOUTH DAILY   ampicillin  IVPB Inject 2 g into the vein every 8 (eight) hours. Indication:  Enterococcal bacteremia Last Day of Therapy:  Nov 13, 2018 Labs - Once weekly:  CBC/D and BMP, Labs - Every other week:  ESR and CRP   aspirin EC 81 MG tablet Take 81 mg by mouth daily.   atorvastatin 40 MG tablet Commonly known as:  LIPITOR Take 40 mg by mouth daily.   CALCIUM  1000 + D PO Take 1,000 mg by mouth daily.   cefTRIAXone  IVPB Commonly known as:  ROCEPHIN Inject 2 g into the vein every 12 (twelve) hours. Indication:  Enterococcal bacteremia  Last Day of Therapy:  Nov 13, 2018 Labs - Once weekly:  CBC/D and BMP, Labs - Every other week:  ESR and CRP   cyclobenzaprine 5 MG tablet Commonly known as:  FLEXERIL Take 1 tablet (5 mg total) by mouth 2 (two) times daily.   folic acid 659 MCG tablet Commonly known as:  FOLVITE Take 400 mcg by mouth daily.   HYDROcodone-acetaminophen 5-325 MG tablet Commonly known as:  NORCO/VICODIN Take 1-2 tablets by mouth every 4 (four) hours as needed for moderate pain.   metoprolol succinate 50 MG 24 hr tablet Commonly known as:  TOPROL-XL TAKE 1 TABLET BY MOUTH DAILY. DUE FOR FOLLOW UP APPOINTMENT. KEEP OFFICE VISIT What changed:  See the new instructions.     multivitamin with minerals Tabs tablet Take 1 tablet by mouth daily. Theragran   nitroGLYCERIN 0.4 MG SL tablet Commonly known as:  NITROSTAT Place 1 tablet (0.4 mg total) under the tongue every 5 (five) minutes as needed for chest pain.   timolol 0.5 % ophthalmic solution Commonly known as:  TIMOPTIC Place 1 drop into the right eye 2 (two) times daily.   vitamin C 500 MG tablet Commonly known as:  ASCORBIC ACID Take 500 mg by mouth daily.   Vitamin D 50 MCG (2000 UT) Caps Take 2,000 Units by mouth daily.            Home Infusion Instuctions  (From admission, onward)         Start     Ordered   10/02/18 0000  Home infusion instructions [facility] May follow [place] Dosing Protocol; May administer Cathflo as needed to maintain patency of vascular access device.; Flushing of vascular access device: per [facility] Protocol: 0.9% NaCl pre/post medica...    Question Answer Comment  Instructions May follow [place] Dosing Protocol   Instructions May administer Cathflo as needed to maintain patency of vascular access device.   Instructions Flushing of vascular access device: per [facility] Protocol: 0.9% NaCl pre/post medication administration and prn patency; Heparin 100 u/ml, 69m for implanted ports and Heparin 10u/ml, 562mfor all other central venous catheters.   Instructions May follow [facility] Anaphylaxis Protocol for First Dose Administration in the home: 0.9% NaCl at 25-50 ml/hr to maintain IV access for protocol meds. Epinephrine 0.3 ml IV/IM PRN and Benadryl 25-50 IV/IM PRN s/s of anaphylaxis.   Instructions [facility] Infusion Coordinator (RN) to assist per patient IV care needs in the home PRN.      10/02/18 1104         No Known Allergies  The results of significant diagnostics from this hospitalization (including imaging, microbiology, ancillary and laboratory) are listed below for reference.    Significant Diagnostic Studies: Mr Thoracic Spine Wo  Contrast  Result Date: 09/27/2018 CLINICAL DATA:  Back pain. History of prostate cancer. Sepsis, rule out infection EXAM: MRI THORACIC SPINE WITHOUT CONTRAST TECHNIQUE: Multiplanar, multisequence MR imaging of the thoracic spine was performed. No intravenous contrast was administered. COMPARISON:  Lumbar MRI 09/26/2018.  Chest two-view 09/12/2004 FINDINGS: Alignment:  Normal alignment with moderate kyphosis. Vertebrae: Negative for fracture or mass. Numerous hemangiomata are present throughout the thoracic vertebra. No bone marrow edema. Cord:  No cord compression.  Cord signal normal throughout. Paraspinal and other soft  tissues: Small bilateral pleural effusions. No paraspinous mass or abscess. Disc levels: Negative for discitis osteomyelitis in the thoracic spine. Multilevel degenerative changes throughout the thoracic spine. No spinal stenosis. Small right-sided disc protrusion T8-9. Small central disc protrusion T7-8. Generous size spinal canal. Bilateral facet degeneration at T10-11. IMPRESSION: No acute abnormality. Multilevel degenerative change without stenosis. Electronically Signed   By: Franchot Gallo M.D.   On: 09/27/2018 12:15   Mr Lumbar Spine Wo Contrast  Result Date: 09/26/2018 CLINICAL DATA:  Intracarpal sepsis. Fever with suprapubic and low back pain. EXAM: MRI LUMBAR SPINE WITHOUT CONTRAST TECHNIQUE: Multiplanar, multisequence MR imaging of the lumbar spine was performed. No intravenous contrast was administered. COMPARISON:  None. FINDINGS: Segmentation: Normal. The lowest disc space is considered to be L5-S1. Alignment:  Normal Vertebrae: No acute compression fracture, discitis-osteomyelitis of focal marrow lesion. Conus medullaris and cauda equina: The conus medullaris terminates at the L1 level. The cauda equina and conus medullaris are both normal. Paraspinal and other soft tissues: The visualized retroperitoneal organs and paraspinal soft tissues are normal. Disc levels: Sagittal plane  imaging includes the T11-12 disc level through the upper sacrum, with axial imaging of the L1-2 to L5-S1 disc levels. L1-2: Large anterior osteophyte. No spinal canal or neural foraminal stenosis. Normal facets. L2-3: Mild disc bulge with narrowing of the lateral recesses, right greater than left. Mild bilateral neural foraminal stenosis. No central spinal canal stenosis. L3-4: Mild disc bulge with right-greater-than-left lateral recess narrowing. No central spinal canal stenosis. Mild right foraminal stenosis. L4-5: Intermediate disc bulge and moderate facet hypertrophy. Moderate spinal canal stenosis with mild right and moderate left neural foraminal stenosis. L5-S1: Small central disc extrusion with superior migration narrowing the right lateral recess and slightly displacing the right S1 nerve root. No central spinal canal stenosis. Moderate bilateral foraminal stenosis. The visualized portion of the sacrum is normal. IMPRESSION: 1. No acute abnormality or evidence for discitis-osteomyelitis. 2. Moderate L4-5 spinal canal stenosis with mild right, moderate left neural foraminal stenosis. 3. L5-S1 right lateral recess narrowing with slight displacement of the descending right S1 nerve root. Moderate bilateral foraminal stenosis. Electronically Signed   By: Ulyses Jarred M.D.   On: 09/26/2018 19:09   Mr Sacrum Si Joints Wo Contrast  Result Date: 09/27/2018 CLINICAL DATA:  Fever, sepsis, suprapubic and low back pain. EXAM: MRI PELVIS WITHOUT CONTRAST (sacrum protocol) TECHNIQUE: Multiplanar multisequence MR imaging of the pelvis was performed. No intravenous contrast was administered. COMPARISON:  MRI lumbar spine 09/26/2018; CT pelvis 09/25/2018 FINDINGS: Osseous structures and joints: Both sacroiliac joints. Unremarkable. No evidence septic joint. Degenerative facet arthropathy in the lower lumbar spine. No abnormal edema signal in the sacrum. Musculotendinous: No significant regional muscular edema. Soft  tissue/other: Prostatectomy. Suspected trace pelvic ascites, significance uncertain. Sigmoid colon diverticulosis. No sciatic notch impingement. IMPRESSION: 1. The sacroiliac joints appear unremarkable. No sacral abnormality observed. 2. Trace pelvic ascites. 3. Prostatectomy. Electronically Signed   By: Van Clines M.D.   On: 09/27/2018 07:51   Ct Renal Stone Study  Result Date: 09/25/2018 CLINICAL DATA:  Lower back pain for 2 days EXAM: CT ABDOMEN AND PELVIS WITHOUT CONTRAST TECHNIQUE: Multidetector CT imaging of the abdomen and pelvis was performed following the standard protocol without IV contrast. COMPARISON:  06/24/2012 FINDINGS: Lower chest: 11 mm mildly lobulated nodule is noted increased from prior exam at which time it measured 9 mm. Given the slow growth this is likely benign in etiology although follow-up is recommended as described below. Hepatobiliary: No  focal liver abnormality is seen. No gallstones, gallbladder wall thickening, or biliary dilatation. Pancreas: Unremarkable. No pancreatic ductal dilatation or surrounding inflammatory changes. Spleen: Normal in size without focal abnormality. Adrenals/Urinary Tract: Adrenal glands are within normal limits. The kidneys are well visualized bilaterally without evidence of renal calculi or obstructive change. Bladder is well distended. Stomach/Bowel: The appendix is within normal limits. Diverticular change of the colon is noted without evidence of diverticulitis. Small bowel and stomach are within normal limits with the exception of a sliding-type hiatal hernia. Vascular/Lymphatic: Aortic atherosclerosis. No enlarged abdominal or pelvic lymph nodes. Reproductive: Prostate is been surgically removed. Other: No abdominal wall hernia or abnormality. No abdominopelvic ascites. Musculoskeletal: Degenerative changes of lumbar spine are noted. IMPRESSION: Diverticulosis without diverticulitis. Sliding-type hiatal hernia. Lobulated right lower lobe  nodule increased 2 mm over 6 years. This is likely benign in etiology. Repeat chest CT in 3 months is recommended to assess for stability. Electronically Signed   By: Inez Catalina M.D.   On: 09/25/2018 15:39   [place] Ekg Site Rite  Result Date: 09/29/2018 If Site Rite image not attached, placement could not be confirmed due to current cardiac rhythm.   Microbiology: Recent Results (from the past 240 hour(s))  Culture, Urine     Status: Abnormal   Collection Time: 09/25/18  7:51 PM  Result Value Ref Range Status   Specimen Description URINE, RANDOM  Final   Special Requests   Final    NONE Performed at [facility], [address], [place], [place] [address]    Culture 10,000 COLONIES/mL ENTEROCOCCUS FAECALIS (A)  Final   Report Status 09/27/2018 FINAL  Final   Organism ID, Bacteria ENTEROCOCCUS FAECALIS (A)  Final      Susceptibility   Enterococcus faecalis - MIC*    AMPICILLIN <=2 SENSITIVE Sensitive     LEVOFLOXACIN 1 SENSITIVE Sensitive     NITROFURANTOIN <=16 SENSITIVE Sensitive     VANCOMYCIN 1 SENSITIVE Sensitive     * 10,000 COLONIES/mL ENTEROCOCCUS FAECALIS  Culture, blood (routine x 2)     Status: Abnormal   Collection Time: 09/25/18  8:15 PM  Result Value Ref Range Status   Specimen Description BLOOD LEFT FOREARM  Final   Special Requests   Final    BOTTLES DRAWN AEROBIC AND ANAEROBIC Blood Culture adequate volume   Culture  Setup Time   Final    GRAM POSITIVE COCCI IN BOTH AEROBIC AND ANAEROBIC BOTTLES Organism ID to follow CRITICAL RESULT CALLED TO, READ BACK BY AND VERIFIED WITH: Royann Shivers PharmD 11:50 09/26/18 ([email or web address]) Performed at [facility],  [address], [place], [place] [address]    Culture ENTEROCOCCUS FAECALIS (A)  Final   Report Status 09/28/2018 FINAL  Final   Organism ID, Bacteria ENTEROCOCCUS FAECALIS  Final      Susceptibility   Enterococcus faecalis - MIC*    AMPICILLIN <=2 SENSITIVE Sensitive     VANCOMYCIN 1 SENSITIVE Sensitive       GENTAMICIN SYNERGY SENSITIVE Sensitive     * ENTEROCOCCUS FAECALIS  Blood Culture ID Panel (Reflexed)     Status: Abnormal   Collection Time: 09/25/18  8:15 PM  Result Value Ref Range Status   Enterococcus species DETECTED (A) NOT DETECTED Final    Comment: CRITICAL RESULT CALLED TO, READ BACK BY AND VERIFIED WITH: Royann Shivers PharmD 11:50 09/26/18 ([email or web address])    Vancomycin resistance NOT DETECTED NOT DETECTED Final   Listeria monocytogenes NOT DETECTED NOT DETECTED Final  Staphylococcus species NOT DETECTED NOT DETECTED Final   Staphylococcus aureus (BCID) NOT DETECTED NOT DETECTED Final   Streptococcus species NOT DETECTED NOT DETECTED Final   Streptococcus agalactiae NOT DETECTED NOT DETECTED Final   Streptococcus pneumoniae NOT DETECTED NOT DETECTED Final   Streptococcus pyogenes NOT DETECTED NOT DETECTED Final   Acinetobacter baumannii NOT DETECTED NOT DETECTED Final   Enterobacteriaceae species NOT DETECTED NOT DETECTED Final   Enterobacter cloacae complex NOT DETECTED NOT DETECTED Final   Escherichia coli NOT DETECTED NOT DETECTED Final   Klebsiella oxytoca NOT DETECTED NOT DETECTED Final   Klebsiella pneumoniae NOT DETECTED NOT DETECTED Final   Proteus species NOT DETECTED NOT DETECTED Final   Serratia marcescens NOT DETECTED NOT DETECTED Final   Haemophilus influenzae NOT DETECTED NOT DETECTED Final   Neisseria meningitidis NOT DETECTED NOT DETECTED Final   Pseudomonas aeruginosa NOT DETECTED NOT DETECTED Final   Candida albicans NOT DETECTED NOT DETECTED Final   Candida glabrata NOT DETECTED NOT DETECTED Final   Candida krusei NOT DETECTED NOT DETECTED Final   Candida parapsilosis NOT DETECTED NOT DETECTED Final   Candida tropicalis NOT DETECTED NOT DETECTED Final    Comment: Performed at [facility], [place] [address], [place], [place] [address]  Culture, blood (routine x 2)     Status: Abnormal   Collection Time: 09/25/18  8:20 PM  Result Value Ref Range Status    Specimen Description BLOOD LEFT ANTECUBITAL  Final   Special Requests   Final    BOTTLES DRAWN AEROBIC AND ANAEROBIC Blood Culture results may not be optimal due to an inadequate volume of blood received in culture bottles   Culture  Setup Time   Final    GRAM POSITIVE COCCI IN BOTH AEROBIC AND ANAEROBIC BOTTLES CRITICAL VALUE NOTED.  VALUE IS CONSISTENT WITH PREVIOUSLY REPORTED AND CALLED VALUE.    Culture (A)  Final    ENTEROCOCCUS FAECALIS SUSCEPTIBILITIES PERFORMED ON PREVIOUS CULTURE WITHIN THE LAST 5 DAYS. Performed at [facility], [place] [address] [place], [place] [address]    Report Status 09/28/2018 FINAL  Final  Blood culture (routine x 2)     Status: None (Preliminary result)   Collection Time: 09/27/18 12:25 PM  Result Value Ref Range Status   Specimen Description BLOOD LEFT ANTECUBITAL  Final   Special Requests   Final    BOTTLES DRAWN AEROBIC AND ANAEROBIC Blood Culture adequate volume   Culture  Setup Time   Final    AEROBIC BOTTLE ONLY GRAM POSITIVE COCCI CRITICAL VALUE NOTED.  VALUE IS CONSISTENT WITH PREVIOUSLY REPORTED AND CALLED VALUE.    Culture   Final    GRAM POSITIVE COCCI IDENTIFICATION TO FOLLOW Performed at [facility], [place] [address], [place], [place] [address]    Report Status PENDING  Incomplete  Blood culture (routine x 2)     Status: None   Collection Time: 09/27/18 12:31 PM  Result Value Ref Range Status   Specimen Description BLOOD RIGHT ANTECUBITAL  Final   Special Requests   Final    BOTTLES DRAWN AEROBIC AND ANAEROBIC Blood Culture adequate volume   Culture   Final    NO GROWTH 5 DAYS Performed at [facility], [place] [address] [place],  [address]    Report Status 10/02/2018 FINAL  Final     Labs: Basic Metabolic Panel: Recent Labs  Lab 09/26/18 0535 09/27/18 0709 09/28/18 0211 09/29/18 0549 10/02/18 0455  NA 135 136 135  135 134*  K 3.8 3.8 3.9 3.9 3.6  CL 103 104 102 103 100  CO2 20* 21* 21* 21* 24    GLUCOSE 126* 131* 134* 133* 111*  BUN 47* 37* 30* 30* 24*  CREATININE 2.50* 2.15* 1.91* 1.87* 1.73*  CALCIUM 8.2* 8.3* 8.2* 8.2* 8.1*   Liver Function Tests: Recent Labs  Lab 09/25/18 1359 09/26/18 0535  AST 61* 45*  ALT 42 34  ALKPHOS 53 47  BILITOT 1.0 0.7  PROT 6.4* 5.3*  ALBUMIN 3.5 3.0*   Recent Labs  Lab 09/25/18 1359  LIPASE 35   No results for input(s): AMMONIA in the last 168 hours. CBC: Recent Labs  Lab 09/26/18 0535 09/27/18 0709 09/28/18 0211 09/29/18 0549 09/30/18 0513  WBC 7.0 8.6 10.4 12.5* 10.9*  NEUTROABS 5.4 6.5 8.0* 9.2* 7.8*  HGB 9.6* 10.2* 9.4* 10.1* 9.5*  HCT 29.4* 30.1* 27.4* 30.0* 27.7*  MCV 92.5 92.0 90.4 91.5 92.0  PLT 84* 87* 107* 163 193   Cardiac Enzymes: Recent Labs  Lab 09/25/18 2016 09/27/18 0709  CKTOTAL 333 90   BNP: BNP (last 3 results) No results for input(s): BNP in the last 8760 hours.  ProBNP (last 3 results) No results for input(s): PROBNP in the last 8760 hours.  CBG: No results for input(s): GLUCAP in the last 168 hours.  Principal Problem:   AKI (acute kidney injury) ([place]) Active Problems:   Hypercholesterolemia   S/P AVR   Hypertension   History of atherosclerotic cardiovascular disease   Bladder retention   Anemia   Hyponatremia   Pulmonary nodule   Acute lower UTI   Enterococcal bacteremia   Time coordinating discharge: 38 minutes  Signed:        Candid Bovey, DO [facility]  10/02/2018, 11:05 AM

## 2018-10-02 NOTE — Evaluation (Signed)
Occupational Therapy Evaluation Patient Details Name: Randy Chen MRN: [id number] DOB: Nov 13, 1927 Today's Date: 10/02/2018    History of Present Illness Randy Chen is an 85 y.o. male past medical history significant for aortic stenosis status post CABG x5 and 89 prostate cancer hypertension who comes into the emergency department complaining of fever fatigue decreased oral intake, decreased urine output suprapubic and lower back pain that started 4 days prior to admission and worsened last night, he has been prescribed cephalexin by his physician.  Because of  pain and symptoms he went into to urgent care which referred him to the ED   Clinical Impression   Pt PTA: living with spouse in an independent living facility. Pt performing ADL and mobility with modified independence and no AD. Pt currently, limited by poor activity tolerance and inability to stand >5 mins at sink without requiring rest break. Pt ambulating in room and hall a household distance x2 with RW for support. Pt with fair balance at sink for ADL tasks and transferring on/off 3in1 using grab bars and transferring in/out of walk-in shower with 3 in lip with minguardA. Pt performing tasks well and would benefit from continued OT skilled services for ADL, mobility and safety in SNF setting. OT to follow acutely.    Follow Up Recommendations  SNF;Supervision - Intermittent    Equipment Recommendations  3 in 1 bedside commode;Other (comment)(to be determined at next venue)    Recommendations for Other Services       Precautions / Restrictions Precautions Precautions: Fall Restrictions Weight Bearing Restrictions: No      Mobility Bed Mobility Overal bed mobility: Needs Assistance Bed Mobility: Supine to Sit     Supine to sit: Supervision     General bed mobility comments: Increased time  Transfers Overall transfer level: Needs assistance Equipment used: Rolling walker (2 wheeled) Transfers: Sit  to/from Stand Sit to Stand: Min guard         General transfer comment: Tremors and needed cues for hand placement    Balance Overall balance assessment: Needs assistance Sitting-balance support: No upper extremity supported Sitting balance-Leahy Scale: Good     Standing balance support: Bilateral upper extremity supported Standing balance-Leahy Scale: Fair Standing balance comment: requiring 1 arm on counter for ADL                           ADL either performed or assessed with clinical judgement   ADL Overall ADL's : Needs assistance/impaired Eating/Feeding: Modified independent;Sitting   Grooming: Supervision/safety;Wash/dry hands;Wash/dry face;Brushing hair;Applying deodorant;Oral care;Standing   Upper Body Bathing: Supervision/ safety;Set up;Standing   Lower Body Bathing: Set up;Min guard;Sitting/lateral leans;Sit to/from stand   Upper Body Dressing : Supervision/safety;Set up;Standing   Lower Body Dressing: Min guard;Sitting/lateral leans;Sit to/from stand   Toilet Transfer: Min guard;Comfort height toilet;Grab bars   Toileting- Water quality scientist and Hygiene: Min guard;Sitting/lateral lean;Sit to/from stand   Tub/ Banker: Walk-in shower;Min guard;Grab bars   Functional mobility during ADLs: Radio broadcast assistant ADL Comments: Pt performing ADLs with set-upA/supervisionA for UB ADL and minguardA for LB ADL. Pt fatigues easily and  >5 mins of standing requires rest break     Vision Baseline Vision/History: No visual deficits Vision Assessment?: No apparent visual deficits     Perception     Praxis      Pertinent Vitals/Pain Pain Assessment: No/denies pain     Hand Dominance Right   Extremity/Trunk Assessment Upper Extremity Assessment Upper  Extremity Assessment: Overall WFL for tasks assessed   Lower Extremity Assessment Lower Extremity Assessment: Generalized weakness;Defer to PT evaluation   Cervical / Trunk  Assessment Cervical / Trunk Assessment: Normal   Communication Communication Communication: No difficulties   Cognition Arousal/Alertness: Awake/alert Behavior During Therapy: WFL for tasks assessed/performed Overall Cognitive Status: Within Functional Limits for tasks assessed                                     General Comments       Exercises     Shoulder Instructions      Home Living Family/patient expects to be discharged to:: Skilled nursing facility Living Arrangements: Spouse/significant other Available Help at Discharge: Available 24 hours/day Type of Home: Apartment Home Access: Level entry     Home Layout: One level     Bathroom Shower/Tub: Astronomer Accessibility: Yes   Home Equipment: None          Prior Functioning/Environment Level of Independence: Independent        Comments: lives in [place] with his wife, I with mobility and ADLs        OT Problem List: Decreased strength;Impaired balance (sitting and/or standing);Decreased activity tolerance;Impaired vision/perception;Decreased coordination;Decreased safety awareness;Pain      OT Treatment/Interventions: Self-care/ADL training;Therapeutic exercise;Neuromuscular education;Energy conservation;Therapeutic activities;Patient/family education;Balance training    OT Goals(Current goals can be found in the care plan section) Acute Rehab OT Goals Patient Stated Goal: go to SNF side @ [facility] OT Goal Formulation: With patient Time For Goal Achievement: 10/16/18 Potential to Achieve Goals: Good ADL Goals Pt Will Perform Lower Body Dressing: with set-up;sit to/from stand Pt Will Perform Toileting - Clothing Manipulation and hygiene: with set-up;sit to/from stand Additional ADL Goal #1: Pt will state 3 energy conservation techniques to implement with mobility and ADL/IADL. Additional ADL Goal #2: Pt will state 3 fall prevention strategies  OT Frequency:  Min 2X/week   Barriers to D/C:            Co-evaluation              AM-PAC OT "6 Clicks" Daily Activity     Outcome Measure Help from another person eating meals?: None Help from another person taking care of personal grooming?: A Little Help from another person toileting, which includes using toliet, bedpan, or urinal?: A Little Help from another person bathing (including washing, rinsing, drying)?: A Little Help from another person to put on and taking off regular upper body clothing?: None Help from another person to put on and taking off regular lower body clothing?: A Little 6 Click Score: 20   End of Session Equipment Utilized During Treatment: Gait belt;Rolling walker Nurse Communication: Mobility status  Activity Tolerance: Patient tolerated treatment well;Patient limited by fatigue Patient left: in chair;with call bell/phone within reach;with chair alarm set  OT Visit Diagnosis: Unsteadiness on feet (R26.81);Muscle weakness (generalized) (M62.81)                Time: 4081-4481 OT Time Calculation (min): 20 min Charges:  OT General Charges $OT Visit: 1 Visit OT Evaluation $OT Eval Moderate Complexity: 1 Mod  Darryl Nestle) Marsa Aris OTR/L Acute Rehabilitation Services Pager: [phone] Office: [place] 10/02/2018, 8:49 AM

## 2018-10-02 NOTE — TOC Transition Note (Addendum)
Transition of Care [facility]) - CM/SW Discharge Note **DISCHARGE TO [facility]   Patient Details  Name: Randy Chen MRN: [id number] Date of Birth: Apr 11, 1928  Transition of Care [facility]) CM/SW Contact:  Sable Feil, LCSW Phone Number: 10/02/2018, 1:19 PM   Clinical Narrative: Patient advised earlier today that authorization received from his insurance. Admissions director Whitney at [facility] contacted, updated and insurance approval information provided. Mrs. Hogrefe contacted and advised of discharge and non-emergency transport.   Final next level of care: Skilled Nursing Facility Barriers to Discharge: Continued Medical Work up   Patient Goals and CMS Choice Patient states their goals for this hospitalization and ongoing recovery are:: Pt wants to return to his wife  CMS Medicare.gov Compare Post Acute Care list provided to:: Other (Comment Required)(Pt from [facility] and will return to SNF at [facility] once medically stable) Choice offered to / list presented to : NA  Discharge Placement  Mr. Schuermann discharging to [facility] skilled nursing facility for short-term rehab. He will then return to his independent living apartment with his wife.                    Discharge Plan and Services In-house Referral: Clinical Social Work Discharge Planning Services: NA Post Acute Care Choice: Skilled Nursing Facility          DME Arranged: N/A DME Agency: NA HH Arranged: NA HH Agency: NA   Social Determinants of Health (SDOH) Interventions None needed     Readmission Risk Interventions Readmission Risk Prevention Plan 09/28/2018  Transportation Screening Complete  PCP or Specialist Appt within 5-7 Days Complete  Home Care Screening Complete  Medication Review (RN CM) Complete  Some recent data might be hidden

## 2018-10-02 NOTE — Clinical Social Work Note (Signed)
Received insurance authorization for short-term rehab at [facility]. Authorization #: [id number], effective 10/02/18 Next review date: 10/04/18 Insurance Care Coordinator: Erline Levine [phone]  Call made to [facility] and Jerilynn Som, admissions director advised. Patient will go to room 7002 and the number for report is [phone].  Emalia Witkop Givens, MSW, LCSW Licensed Clinical Social Worker [place] [phone]

## 2018-10-02 NOTE — Progress Notes (Signed)
DISCHARGE NOTE Randy Chen to be discharged Skilled nursing facility per MD order. Patient verbalized understanding.  Skin clean, dry and intact without evidence of skin break down, no evidence of skin tears noted. Discharged with PICC line. Site without signs and symptoms of complications. Dressing and pressure applied. Pt denies pain at the site currently. No complaints noted.  Discharge packet assembled. An After Visit Summary (AVS) was printed and given to the EMS personnel. Patient escorted via stretcher and discharged to [facility] via ambulance. Report called to accepting facility; all questions and concerns addressed.   Babs Sciara, RN

## 2018-10-03 LAB — CULTURE, BLOOD (ROUTINE X 2)
Special Requests: ADEQUATE
Special Requests: ADEQUATE

## 2018-10-04 DIAGNOSIS — N179 Acute kidney failure, unspecified: Secondary | ICD-10-CM | POA: Diagnosis not present

## 2018-10-04 DIAGNOSIS — R339 Retention of urine, unspecified: Secondary | ICD-10-CM | POA: Diagnosis not present

## 2018-10-04 DIAGNOSIS — R7881 Bacteremia: Secondary | ICD-10-CM | POA: Diagnosis not present

## 2018-10-04 DIAGNOSIS — R531 Weakness: Secondary | ICD-10-CM | POA: Diagnosis not present

## 2018-10-21 ENCOUNTER — Telehealth: Payer: Self-pay

## 2018-10-21 DIAGNOSIS — I10 Essential (primary) hypertension: Secondary | ICD-10-CM | POA: Diagnosis not present

## 2018-10-21 DIAGNOSIS — Z792 Long term (current) use of antibiotics: Secondary | ICD-10-CM | POA: Diagnosis not present

## 2018-10-21 DIAGNOSIS — N390 Urinary tract infection, site not specified: Secondary | ICD-10-CM | POA: Diagnosis not present

## 2018-10-21 DIAGNOSIS — R600 Localized edema: Secondary | ICD-10-CM | POA: Diagnosis not present

## 2018-10-21 DIAGNOSIS — E785 Hyperlipidemia, unspecified: Secondary | ICD-10-CM | POA: Diagnosis not present

## 2018-10-21 DIAGNOSIS — M545 Low back pain: Secondary | ICD-10-CM | POA: Diagnosis not present

## 2018-10-21 DIAGNOSIS — N179 Acute kidney failure, unspecified: Secondary | ICD-10-CM | POA: Diagnosis not present

## 2018-10-21 DIAGNOSIS — Z452 Encounter for adjustment and management of vascular access device: Secondary | ICD-10-CM | POA: Diagnosis not present

## 2018-10-21 DIAGNOSIS — I2510 Atherosclerotic heart disease of native coronary artery without angina pectoris: Secondary | ICD-10-CM | POA: Diagnosis not present

## 2018-10-21 DIAGNOSIS — R7881 Bacteremia: Secondary | ICD-10-CM | POA: Diagnosis not present

## 2018-10-21 DIAGNOSIS — I739 Peripheral vascular disease, unspecified: Secondary | ICD-10-CM | POA: Diagnosis not present

## 2018-10-21 DIAGNOSIS — Z5181 Encounter for therapeutic drug level monitoring: Secondary | ICD-10-CM | POA: Diagnosis not present

## 2018-10-21 DIAGNOSIS — B952 Enterococcus as the cause of diseases classified elsewhere: Secondary | ICD-10-CM | POA: Diagnosis not present

## 2018-10-21 NOTE — Telephone Encounter (Signed)
LPN received call in triage from [facility]) Patient has been discharged from [facility]. Home Health wanted to verify weekly lab draws for [name] to continue to follow and manage IV antibiotics.  LPN gave verbal orders per [name]    Draw Labs Weekly while on IV antibiotics: _x_ CBC with differential _x_ BMP w GFR x__ CRP x__ ESR    _x_ Please pull PIC at completion of IV antibiotics __ Please leave PIC in place until doctor has seen patient or been notified  Fax weekly labs to [phone]  Clinic Follow Up Appt:  May 13th, at 1045 am with [name] as an E-(PHONE) visit  Verbal order read back and understood.  Eugenia Mcalpine, LPN

## 2018-10-22 ENCOUNTER — Telehealth: Payer: Self-pay | Admitting: Cardiology

## 2018-10-22 NOTE — Telephone Encounter (Signed)
  The patient was discharged from the hospital with a request for PT, OT,  And a home health aide. Randy Chen from [facility] is calling to see if Dr Stanford Breed would be willing to follow this patients care. They would need verbal orders stating that he would follow the patients care for evaluation and treatment.

## 2018-10-22 NOTE — Telephone Encounter (Signed)
[place] for orders [facility]

## 2018-10-22 NOTE — Telephone Encounter (Signed)
Spoke with tanya, order given

## 2018-10-22 NOTE — Telephone Encounter (Signed)
Called and notified [facility] that a message was sent back to Dr.Crenshaw to see if we could get verbal orders to follow care.  Thanks!

## 2018-10-22 NOTE — Telephone Encounter (Signed)
Okay for orders Randy Chen

## 2018-10-23 DIAGNOSIS — N390 Urinary tract infection, site not specified: Secondary | ICD-10-CM | POA: Diagnosis not present

## 2018-10-28 DIAGNOSIS — R7881 Bacteremia: Secondary | ICD-10-CM | POA: Diagnosis not present

## 2018-10-30 ENCOUNTER — Encounter: Payer: Self-pay | Admitting: Infectious Disease

## 2018-10-30 ENCOUNTER — Other Ambulatory Visit: Payer: Self-pay

## 2018-10-30 ENCOUNTER — Ambulatory Visit: Payer: Medicare Other | Admitting: Infectious Disease

## 2018-10-30 ENCOUNTER — Telehealth: Payer: Self-pay

## 2018-10-30 VITALS — BP 154/75 | HR 103 | Temp 97.3°F | Wt 184.0 lb

## 2018-10-30 DIAGNOSIS — B952 Enterococcus as the cause of diseases classified elsewhere: Secondary | ICD-10-CM

## 2018-10-30 DIAGNOSIS — R7881 Bacteremia: Secondary | ICD-10-CM

## 2018-10-30 DIAGNOSIS — Z952 Presence of prosthetic heart valve: Secondary | ICD-10-CM | POA: Diagnosis not present

## 2018-10-30 DIAGNOSIS — N390 Urinary tract infection, site not specified: Secondary | ICD-10-CM | POA: Diagnosis not present

## 2018-10-30 DIAGNOSIS — N179 Acute kidney failure, unspecified: Secondary | ICD-10-CM

## 2018-10-30 NOTE — Telephone Encounter (Signed)
[facility] care, per [name], spoke to [facility] to confirm stopped date for 11/10/2018 and to pull following last dose.  Deanna confirmed new orders and demonstrated understanding with feedback.   Patient was made aware of plan of care.      Lenore Cordia, [place]

## 2018-10-30 NOTE — Telephone Encounter (Signed)
Received call from OT due to hospital referral. LPN made OT aware that Dr Stanford Breed is  following this patients care and treatment for therapy services.  They would need to call Dr. Jacalyn Lefevre office and relay therapy orders.  Eugenia Mcalpine, LPN

## 2018-10-30 NOTE — Progress Notes (Signed)
Subjective:  Complaint some lower suprapubic pain  Patient ID: Randy Chen, male    DOB: [birth year], 85 y.o.   MRN: [id number]  HPI   85 y.o. male with history of a prosthetic aortic valve who was admitted with ampicillin sensitive enterococcal bacteremia and severe low back pain.   He had a transthoracic echocardiogram while inpatient which did not show evidence of endocarditis.  We did not pursue a transesophageal echocardiogram but instead pursued empiric treatment for bacterial endocarditis.  In looking back through his her biology data I am a bit bothered that I can see now that his blood cultures taken on the 10th 2 days after his initial cultures were positive were still positive in 2 out of 2 sites.  This makes me concerned that he definitely did have endocarditis.  I also do not like that we did not document clearance of his bacteremia prior to placement of a central line which was placed on 13 April.  He otherwise seems to be doing well without fevers or chills or other systemic symptoms.  He does have some suprapubic pain that began this morning but which is not accompanied by any dysuria.  He was suffering from constipation the last few days but this seems to be better now.  He also is having trouble with a line accommodating the ampicillin but this seems to be due to a physical obstruction at the entrance of the line to the tape being taped too hard.      Past Medical History:  Diagnosis Date  . Aortic stenosis   . Coronary artery disease    CABG x5 - 1987  . History of atherosclerotic cardiovascular disease   . History of prostatectomy   . Hyperlipidemia   . Hypertension   . PAD (peripheral artery disease) ([place]) 06/06/2017    Past Surgical History:  Procedure Laterality Date  . CARDIAC CATHETERIZATION     EF of 66%  . CARDIAC VALVE REPLACEMENT  2006  . CORONARY ARTERY BYPASS GRAFT  1987   x5  . I&D EXTREMITY Left 01/10/2015   Procedure: DEBRIDEMENT  ANKLE PLACEMENT ANTIBIOTIC BEADS AND WOUND VAC;  Surgeon: [name], MD;  Location: [facility];  Service: Orthopedics;  Laterality: Left;    Family History  Problem Relation Age of Onset  . Heart disease Father   . Heart failure Mother   . Heart attack Brother   . Congestive Heart Failure Sister   . Congestive Heart Failure Sister       Social History   Socioeconomic History  . Marital status: Married    Spouse name: Not on file  . Number of children: Not on file  . Years of education: Not on file  . Highest education level: Not on file  Occupational History  . Not on file  Social Needs  . Financial resource strain: Not on file  . Food insecurity:    Worry: Not on file    Inability: Not on file  . Transportation needs:    Medical: Not on file    Non-medical: Not on file  Tobacco Use  . Smoking status: Former Smoker    Last attempt to quit: 11/18/1955    Years since quitting: 62.9  . Smokeless tobacco: Never Used  Substance and Sexual Activity  . Alcohol use: No  . Drug use: No  . Sexual activity: Not on file  Lifestyle  . Physical activity:    Days per week: Not on file  Minutes per session: Not on file  . Stress: Not on file  Relationships  . Social connections:    Talks on phone: Not on file    Gets together: Not on file    Attends religious service: Not on file    Active member of club or organization: Not on file    Attends meetings of clubs or organizations: Not on file    Relationship status: Not on file  Other Topics Concern  . Not on file  Social History Narrative  . Not on file    No Known Allergies   Current Outpatient Medications:  .  amLODipine (NORVASC) 5 MG tablet, TAKE 1 TABLET BY MOUTH DAILY (Patient taking differently: Take 5 mg by mouth daily. ), Disp: 90 tablet, Rfl: 3 .  ampicillin IVPB, Inject 2 g into the vein every 8 (eight) hours. Indication:  Enterococcal bacteremia Last Day of Therapy:  Nov 13, 2018 Labs - Once weekly:  CBC/D  and BMP, Labs - Every other week:  ESR and CRP, Disp: 135 Units, Rfl: 0 .  aspirin EC 81 MG tablet, Take 81 mg by mouth daily., Disp: , Rfl:  .  atorvastatin (LIPITOR) 40 MG tablet, Take 40 mg by mouth daily., Disp: , Rfl:  .  Calcium Carb-Cholecalciferol (CALCIUM 1000 + D PO), Take 1,000 mg by mouth daily., Disp: , Rfl:  .  cefTRIAXone (ROCEPHIN) IVPB, Inject 2 g into the vein every 12 (twelve) hours. Indication:  Enterococcal bacteremia  Last Day of Therapy:  Nov 13, 2018 Labs - Once weekly:  CBC/D and BMP, Labs - Every other week:  ESR and CRP, Disp: 90 Units, Rfl: 0 .  Cholecalciferol (VITAMIN D) 2000 UNITS CAPS, Take 2,000 Units by mouth daily., Disp: , Rfl:  .  cyclobenzaprine (FLEXERIL) 5 MG tablet, Take 1 tablet (5 mg total) by mouth 2 (two) times daily., Disp: 30 tablet, Rfl: 0 .  folic acid (FOLVITE) 324 MCG tablet, Take 400 mcg by mouth daily. , Disp: , Rfl:  .  HYDROcodone-acetaminophen (NORCO/VICODIN) 5-325 MG tablet, Take 1-2 tablets by mouth every 4 (four) hours as needed for moderate pain., Disp: 20 tablet, Rfl: 0 .  metoprolol succinate (TOPROL-XL) 50 MG 24 hr tablet, TAKE 1 TABLET BY MOUTH DAILY. DUE FOR FOLLOW UP APPOINTMENT. KEEP OFFICE VISIT (Patient taking differently: Take 50 mg by mouth daily. ), Disp: 90 tablet, Rfl: 3 .  Multiple Vitamin (MULTIVITAMIN WITH MINERALS) TABS, Take 1 tablet by mouth daily. Theragran, Disp: , Rfl:  .  nitroGLYCERIN (NITROSTAT) 0.4 MG SL tablet, Place 1 tablet (0.4 mg total) under the tongue every 5 (five) minutes as needed for chest pain., Disp: 25 tablet, Rfl: 3 .  timolol (TIMOPTIC) 0.5 % ophthalmic solution, Place 1 drop into the right eye 2 (two) times daily. , Disp: , Rfl: 3 .  vitamin C (ASCORBIC ACID) 500 MG tablet, Take 500 mg by mouth daily., Disp: , Rfl:    Review of Systems  Constitutional: Negative for chills and fever.  HENT: Negative for congestion and sore throat.   Eyes: Negative for photophobia.  Respiratory: Negative for  cough, shortness of breath and wheezing.   Cardiovascular: Negative for chest pain, palpitations and leg swelling.  Gastrointestinal: Positive for abdominal pain and constipation. Negative for blood in stool, diarrhea, nausea and vomiting.  Genitourinary: Negative for dysuria, flank pain and hematuria.  Musculoskeletal: Negative for back pain and myalgias.  Skin: Negative for rash.  Neurological: Negative for dizziness, weakness and headaches.  Hematological: Does not bruise/bleed easily.  Psychiatric/Behavioral: Negative for suicidal ideas.       Objective:   Physical Exam Constitutional:      General: He is not in acute distress.    Appearance: He is not diaphoretic.  HENT:     Head: Normocephalic and atraumatic.     Right Ear: External ear normal.     Left Ear: External ear normal.     Nose: Nose normal.     Mouth/Throat:     Pharynx: No oropharyngeal exudate.  Eyes:     General: No scleral icterus.    Conjunctiva/sclera: Conjunctivae normal.     Pupils: Pupils are equal, round, and reactive to light.  Neck:     Musculoskeletal: Normal range of motion and neck supple.  Cardiovascular:     Rate and Rhythm: Normal rate and regular rhythm.     Heart sounds: Normal heart sounds. No murmur. No friction rub. No gallop.   Pulmonary:     Effort: Pulmonary effort is normal. No respiratory distress.     Breath sounds: Normal breath sounds. No stridor. No wheezing or rales.  Chest:     Chest wall: Tenderness present.  Abdominal:     General: Abdomen is flat. Bowel sounds are normal. There is no distension.     Palpations: Abdomen is soft.     Tenderness: There is abdominal tenderness. There is no rebound.  Musculoskeletal: Normal range of motion.        General: No tenderness.  Lymphadenopathy:     Cervical: No cervical adenopathy.  Skin:    General: Skin is warm and dry.     Coloration: Skin is not pale.     Findings: No erythema or rash.  Neurological:     Mental Status:  He is alert and oriented to person, place, and time.     Coordination: Coordination normal.  Psychiatric:        Judgment: Judgment normal.   Minimal suprapubic tenderness PICC line is clean dry and intact       Assessment & Plan:   85 year old man with prosthetic aortic valve who was admitted with enterococcal bacteremia and sepsis.  It is disconcerting that he did not clear his blood cultures and I am glad that we try to treat him empirically for endocarditis despite not having proven that he had this by transesophageal echocardiogram.  We will plan on completing his 6 weeks of therapy with dual beta-lactam therapy with ampicillin high-dose ceftriaxone.  This will be Nov 10, 2018  We will then pull his PICC line and check surveillance cultures 2 weeks later.  Again I am bothered that we did not ensure that he had sterile blood cultures prior to placement of his central line which was placed 1 day after his positive repeat cultures on 12 April.  I spent greater than 25 minutes with the patient including greater than 50% of time in face to face counsel of the patient spouse regarding the nature of bloodstream infection sepsis, heart valve infections and in coordination of his care

## 2018-11-01 DIAGNOSIS — N390 Urinary tract infection, site not specified: Secondary | ICD-10-CM | POA: Diagnosis not present

## 2018-11-04 DIAGNOSIS — R7881 Bacteremia: Secondary | ICD-10-CM | POA: Diagnosis not present

## 2018-11-06 DIAGNOSIS — N390 Urinary tract infection, site not specified: Secondary | ICD-10-CM | POA: Diagnosis not present

## 2018-11-09 ENCOUNTER — Emergency Department (HOSPITAL_BASED_OUTPATIENT_CLINIC_OR_DEPARTMENT_OTHER): Payer: Medicare Other

## 2018-11-09 ENCOUNTER — Other Ambulatory Visit: Payer: Self-pay

## 2018-11-09 ENCOUNTER — Encounter (HOSPITAL_BASED_OUTPATIENT_CLINIC_OR_DEPARTMENT_OTHER): Payer: Self-pay | Admitting: *Deleted

## 2018-11-09 ENCOUNTER — Inpatient Hospital Stay (HOSPITAL_BASED_OUTPATIENT_CLINIC_OR_DEPARTMENT_OTHER)
Admission: EM | Admit: 2018-11-09 | Discharge: 2018-11-13 | DRG: 539 | Disposition: A | Discharge status: Home Health | Payer: Medicare Other | Attending: Internal Medicine | Admitting: Internal Medicine

## 2018-11-09 DIAGNOSIS — Z87891 Personal history of nicotine dependence: Secondary | ICD-10-CM

## 2018-11-09 DIAGNOSIS — I38 Endocarditis, valve unspecified: Secondary | ICD-10-CM | POA: Diagnosis not present

## 2018-11-09 DIAGNOSIS — M4646 Discitis, unspecified, lumbar region: Secondary | ICD-10-CM | POA: Diagnosis not present

## 2018-11-09 DIAGNOSIS — Z20828 Contact with and (suspected) exposure to other viral communicable diseases: Secondary | ICD-10-CM | POA: Diagnosis not present

## 2018-11-09 DIAGNOSIS — T826XXA Infection and inflammatory reaction due to cardiac valve prosthesis, initial encounter: Secondary | ICD-10-CM | POA: Diagnosis present

## 2018-11-09 DIAGNOSIS — R7881 Bacteremia: Secondary | ICD-10-CM | POA: Diagnosis present

## 2018-11-09 DIAGNOSIS — E1122 Type 2 diabetes mellitus with diabetic chronic kidney disease: Secondary | ICD-10-CM | POA: Diagnosis not present

## 2018-11-09 DIAGNOSIS — N183 Chronic kidney disease, stage 3 (moderate): Secondary | ICD-10-CM | POA: Diagnosis not present

## 2018-11-09 DIAGNOSIS — C9000 Multiple myeloma not having achieved remission: Secondary | ICD-10-CM | POA: Diagnosis not present

## 2018-11-09 DIAGNOSIS — E1169 Type 2 diabetes mellitus with other specified complication: Secondary | ICD-10-CM | POA: Diagnosis present

## 2018-11-09 DIAGNOSIS — I330 Acute and subacute infective endocarditis: Secondary | ICD-10-CM | POA: Diagnosis not present

## 2018-11-09 DIAGNOSIS — M4626 Osteomyelitis of vertebra, lumbar region: Secondary | ICD-10-CM

## 2018-11-09 DIAGNOSIS — M4636 Infection of intervertebral disc (pyogenic), lumbar region: Secondary | ICD-10-CM | POA: Diagnosis not present

## 2018-11-09 DIAGNOSIS — D571 Sickle-cell disease without crisis: Secondary | ICD-10-CM | POA: Diagnosis present

## 2018-11-09 DIAGNOSIS — Z8546 Personal history of malignant neoplasm of prostate: Secondary | ICD-10-CM

## 2018-11-09 DIAGNOSIS — K5790 Diverticulosis of intestine, part unspecified, without perforation or abscess without bleeding: Secondary | ICD-10-CM | POA: Diagnosis not present

## 2018-11-09 DIAGNOSIS — R1030 Lower abdominal pain, unspecified: Secondary | ICD-10-CM | POA: Diagnosis not present

## 2018-11-09 DIAGNOSIS — I700 Atherosclerosis of aorta: Secondary | ICD-10-CM | POA: Diagnosis not present

## 2018-11-09 DIAGNOSIS — M869 Osteomyelitis, unspecified: Secondary | ICD-10-CM | POA: Diagnosis not present

## 2018-11-09 DIAGNOSIS — E785 Hyperlipidemia, unspecified: Secondary | ICD-10-CM | POA: Diagnosis present

## 2018-11-09 DIAGNOSIS — Z03818 Encounter for observation for suspected exposure to other biological agents ruled out: Secondary | ICD-10-CM | POA: Diagnosis not present

## 2018-11-09 DIAGNOSIS — Z953 Presence of xenogenic heart valve: Secondary | ICD-10-CM | POA: Diagnosis not present

## 2018-11-09 DIAGNOSIS — Z7982 Long term (current) use of aspirin: Secondary | ICD-10-CM

## 2018-11-09 DIAGNOSIS — K6812 Psoas muscle abscess: Secondary | ICD-10-CM | POA: Diagnosis not present

## 2018-11-09 DIAGNOSIS — E1151 Type 2 diabetes mellitus with diabetic peripheral angiopathy without gangrene: Secondary | ICD-10-CM | POA: Diagnosis present

## 2018-11-09 DIAGNOSIS — M4620 Osteomyelitis of vertebra, site unspecified: Secondary | ICD-10-CM

## 2018-11-09 DIAGNOSIS — Z952 Presence of prosthetic heart valve: Secondary | ICD-10-CM | POA: Diagnosis not present

## 2018-11-09 DIAGNOSIS — N390 Urinary tract infection, site not specified: Secondary | ICD-10-CM | POA: Diagnosis not present

## 2018-11-09 DIAGNOSIS — I129 Hypertensive chronic kidney disease with stage 1 through stage 4 chronic kidney disease, or unspecified chronic kidney disease: Secondary | ICD-10-CM | POA: Diagnosis present

## 2018-11-09 DIAGNOSIS — Z8249 Family history of ischemic heart disease and other diseases of the circulatory system: Secondary | ICD-10-CM

## 2018-11-09 DIAGNOSIS — I2510 Atherosclerotic heart disease of native coronary artery without angina pectoris: Secondary | ICD-10-CM | POA: Diagnosis not present

## 2018-11-09 DIAGNOSIS — M4640 Discitis, unspecified, site unspecified: Secondary | ICD-10-CM | POA: Diagnosis present

## 2018-11-09 DIAGNOSIS — J45909 Unspecified asthma, uncomplicated: Secondary | ICD-10-CM | POA: Diagnosis present

## 2018-11-09 DIAGNOSIS — Z66 Do not resuscitate: Secondary | ICD-10-CM | POA: Diagnosis present

## 2018-11-09 DIAGNOSIS — Z79899 Other long term (current) drug therapy: Secondary | ICD-10-CM

## 2018-11-09 DIAGNOSIS — Z951 Presence of aortocoronary bypass graft: Secondary | ICD-10-CM | POA: Diagnosis not present

## 2018-11-09 DIAGNOSIS — M4627 Osteomyelitis of vertebra, lumbosacral region: Secondary | ICD-10-CM | POA: Diagnosis not present

## 2018-11-09 DIAGNOSIS — D649 Anemia, unspecified: Secondary | ICD-10-CM | POA: Diagnosis not present

## 2018-11-09 DIAGNOSIS — B952 Enterococcus as the cause of diseases classified elsewhere: Secondary | ICD-10-CM | POA: Diagnosis not present

## 2018-11-09 DIAGNOSIS — M8600 Acute hematogenous osteomyelitis, unspecified site: Secondary | ICD-10-CM | POA: Diagnosis not present

## 2018-11-09 DIAGNOSIS — R011 Cardiac murmur, unspecified: Secondary | ICD-10-CM | POA: Diagnosis not present

## 2018-11-09 DIAGNOSIS — R911 Solitary pulmonary nodule: Secondary | ICD-10-CM | POA: Diagnosis not present

## 2018-11-09 DIAGNOSIS — Z8619 Personal history of other infectious and parasitic diseases: Secondary | ICD-10-CM | POA: Diagnosis not present

## 2018-11-09 DIAGNOSIS — J8410 Pulmonary fibrosis, unspecified: Secondary | ICD-10-CM | POA: Diagnosis not present

## 2018-11-09 DIAGNOSIS — R000 Tachycardia, unspecified: Secondary | ICD-10-CM | POA: Diagnosis not present

## 2018-11-09 HISTORY — DX: Malignant (primary) neoplasm, unspecified: C80.1

## 2018-11-09 LAB — CBC WITH DIFFERENTIAL/PLATELET
Abs Immature Granulocytes: 0.03 10*3/uL (ref 0.00–0.07)
Basophils Absolute: 0 10*3/uL (ref 0.0–0.1)
Basophils Relative: 0 %
Eosinophils Absolute: 0.1 10*3/uL (ref 0.0–0.5)
Eosinophils Relative: 1 %
HCT: 27.5 % — ABNORMAL LOW (ref 39.0–52.0)
Hemoglobin: 8.6 g/dL — ABNORMAL LOW (ref 13.0–17.0)
Immature Granulocytes: 0 %
Lymphocytes Relative: 13 %
Lymphs Abs: 1 10*3/uL (ref 0.7–4.0)
MCH: 28 pg (ref 26.0–34.0)
MCHC: 31.3 g/dL (ref 30.0–36.0)
MCV: 89.6 fL (ref 80.0–100.0)
Monocytes Absolute: 0.8 10*3/uL (ref 0.1–1.0)
Monocytes Relative: 11 %
Neutro Abs: 5.9 10*3/uL (ref 1.7–7.7)
Neutrophils Relative %: 75 %
Platelets: 299 10*3/uL (ref 150–400)
RBC: 3.07 MIL/uL — ABNORMAL LOW (ref 4.22–5.81)
RDW: 13.8 % (ref 11.5–15.5)
WBC: 7.9 10*3/uL (ref 4.0–10.5)
nRBC: 0 % (ref 0.0–0.2)

## 2018-11-09 LAB — COMPREHENSIVE METABOLIC PANEL
ALT: 18 U/L (ref 0–44)
AST: 25 U/L (ref 15–41)
Albumin: 3 g/dL — ABNORMAL LOW (ref 3.5–5.0)
Alkaline Phosphatase: 94 U/L (ref 38–126)
Anion gap: 11 (ref 5–15)
BUN: 18 mg/dL (ref 8–23)
CO2: 24 mmol/L (ref 22–32)
Calcium: 8.3 mg/dL — ABNORMAL LOW (ref 8.9–10.3)
Chloride: 99 mmol/L (ref 98–111)
Creatinine, Ser: 1.32 mg/dL — ABNORMAL HIGH (ref 0.61–1.24)
GFR calc Af Amer: 55 mL/min — ABNORMAL LOW (ref >60)
GFR calc non Af Amer: 48 mL/min — ABNORMAL LOW (ref >60)
Glucose, Bld: 122 mg/dL — ABNORMAL HIGH (ref 70–99)
Potassium: 3.6 mmol/L (ref 3.5–5.1)
Sodium: 134 mmol/L — ABNORMAL LOW (ref 135–145)
Total Bilirubin: 0.4 mg/dL (ref 0.3–1.2)
Total Protein: 6.3 g/dL — ABNORMAL LOW (ref 6.5–8.1)

## 2018-11-09 LAB — URINALYSIS, ROUTINE W REFLEX MICROSCOPIC
Bilirubin Urine: NEGATIVE
Glucose, UA: NEGATIVE mg/dL
Hgb urine dipstick: NEGATIVE
Ketones, ur: NEGATIVE mg/dL
Leukocytes,Ua: NEGATIVE
Nitrite: NEGATIVE
Protein, ur: NEGATIVE mg/dL
Specific Gravity, Urine: 1.015 (ref 1.005–1.030)
pH: 7 (ref 5.0–8.0)

## 2018-11-09 LAB — SARS CORONAVIRUS 2 AG (30 MIN TAT): SARS Coronavirus 2 Ag: NEGATIVE

## 2018-11-09 LAB — LACTIC ACID, PLASMA: Lactic Acid, Venous: 1.8 mmol/L (ref 0.5–1.9)

## 2018-11-09 MED ORDER — IOHEXOL 300 MG/ML  SOLN
100.0000 mL | Freq: Once | INTRAMUSCULAR | Status: AC | PRN
  Administered 2018-11-09: 14:00:00 80 mL via INTRAVENOUS

## 2018-11-09 MED ORDER — HYDROCODONE-ACETAMINOPHEN 5-325 MG PO TABS: 1.0000 | ORAL_TABLET | ORAL | Status: DC | PRN

## 2018-11-09 MED ORDER — ACETAMINOPHEN 650 MG RE SUPP: 650.0000 mg | Freq: Four times a day (QID) | RECTAL | Status: DC | PRN

## 2018-11-09 MED ORDER — METOPROLOL SUCCINATE ER 25 MG PO TB24
50.0000 mg | ORAL_TABLET | Freq: Every day | ORAL | Status: DC
  Administered 2018-11-10 – 2018-11-13 (×4): 50 mg via ORAL
  Filled 2018-11-09 (×4): qty 2

## 2018-11-09 MED ORDER — CYCLOBENZAPRINE HCL 5 MG PO TABS: 5.0000 mg | ORAL_TABLET | Freq: Two times a day (BID) | ORAL | Status: DC

## 2018-11-09 MED ORDER — ACETAMINOPHEN 325 MG PO TABS
650.0000 mg | ORAL_TABLET | Freq: Four times a day (QID) | ORAL | Status: DC | PRN
  Administered 2018-11-11: 08:00:00 650 mg via ORAL
  Filled 2018-11-09: qty 2

## 2018-11-09 MED ORDER — HYDROCODONE-ACETAMINOPHEN 5-325 MG PO TABS
1.0000 | ORAL_TABLET | Freq: Once | ORAL | Status: AC
  Administered 2018-11-09: 13:00:00 1 via ORAL
  Filled 2018-11-09: qty 1

## 2018-11-09 MED ORDER — SODIUM CHLORIDE 0.9 % IV BOLUS
500.0000 mL | Freq: Once | INTRAVENOUS | Status: AC
  Administered 2018-11-09: 14:00:00 500 mL via INTRAVENOUS

## 2018-11-09 MED ORDER — ATORVASTATIN CALCIUM 40 MG PO TABS
40.0000 mg | ORAL_TABLET | Freq: Every day | ORAL | Status: DC
  Administered 2018-11-10 – 2018-11-12 (×3): 40 mg via ORAL
  Filled 2018-11-09 (×4): qty 1

## 2018-11-09 MED ORDER — VITAMIN C 500 MG PO TABS
500.0000 mg | ORAL_TABLET | Freq: Every day | ORAL | Status: DC
  Administered 2018-11-10 – 2018-11-13 (×4): 500 mg via ORAL
  Filled 2018-11-09 (×4): qty 1

## 2018-11-09 MED ORDER — VITAMIN D 25 MCG (1000 UNIT) PO TABS
2000.0000 [IU] | ORAL_TABLET | Freq: Every day | ORAL | Status: DC
  Administered 2018-11-10 – 2018-11-13 (×4): 2000 [IU] via ORAL
  Filled 2018-11-09 (×4): qty 2

## 2018-11-09 MED ORDER — MORPHINE SULFATE (PF) 2 MG/ML IV SOLN: 2.0000 mg | Freq: Once | INTRAVENOUS | Status: DC

## 2018-11-09 MED ORDER — HYDROCODONE-ACETAMINOPHEN 5-325 MG PO TABS
1.0000 | ORAL_TABLET | Freq: Four times a day (QID) | ORAL | Status: DC | PRN
  Administered 2018-11-09 – 2018-11-11 (×4): 2 via ORAL
  Administered 2018-11-11 – 2018-11-12 (×3): 1 via ORAL
  Filled 2018-11-09 (×2): qty 1
  Filled 2018-11-09: qty 2
  Filled 2018-11-09: qty 1
  Filled 2018-11-09 (×3): qty 2

## 2018-11-09 MED ORDER — TIMOLOL MALEATE 0.5 % OP SOLN
1.0000 [drp] | Freq: Two times a day (BID) | OPHTHALMIC | Status: DC
  Administered 2018-11-09 – 2018-11-13 (×8): 1 [drp] via OPHTHALMIC
  Filled 2018-11-09 (×4): qty 5

## 2018-11-09 MED ORDER — SODIUM CHLORIDE 0.9 % IV SOLN
2.0000 g | Freq: Three times a day (TID) | INTRAVENOUS | Status: DC
  Administered 2018-11-09 – 2018-11-13 (×12): 2 g via INTRAVENOUS
  Filled 2018-11-09 (×14): qty 2000

## 2018-11-09 MED ORDER — FOLIC ACID 1 MG PO TABS
500.0000 ug | ORAL_TABLET | Freq: Every day | ORAL | Status: DC
  Administered 2018-11-10 – 2018-11-13 (×4): 0.5 mg via ORAL
  Filled 2018-11-09 (×5): qty 1

## 2018-11-09 MED ORDER — AMLODIPINE BESYLATE 5 MG PO TABS
5.0000 mg | ORAL_TABLET | Freq: Every day | ORAL | Status: DC
  Administered 2018-11-10: 09:00:00 5 mg via ORAL
  Filled 2018-11-09: qty 1

## 2018-11-09 MED ORDER — AMPICILLIN SODIUM 2 G IJ SOLR
INTRAMUSCULAR | Status: AC
  Filled 2018-11-09: qty 2000

## 2018-11-09 MED ORDER — MORPHINE SULFATE (PF) 2 MG/ML IV SOLN
1.0000 mg | INTRAVENOUS | Status: DC | PRN
  Administered 2018-11-10: 14:00:00 1 mg via INTRAVENOUS
  Filled 2018-11-09 (×2): qty 1

## 2018-11-09 MED ORDER — ADULT MULTIVITAMIN W/MINERALS CH
1.0000 | ORAL_TABLET | Freq: Every day | ORAL | Status: DC
  Administered 2018-11-10 – 2018-11-13 (×4): 1 via ORAL
  Filled 2018-11-09 (×4): qty 1

## 2018-11-09 MED ORDER — ASPIRIN EC 81 MG PO TBEC
81.0000 mg | DELAYED_RELEASE_TABLET | Freq: Every day | ORAL | Status: DC
  Administered 2018-11-10 – 2018-11-12 (×3): 81 mg via ORAL
  Filled 2018-11-09 (×4): qty 1

## 2018-11-09 MED ORDER — SODIUM CHLORIDE 0.9 % IV SOLN
2.0000 g | Freq: Two times a day (BID) | INTRAVENOUS | Status: DC
  Administered 2018-11-09 – 2018-11-13 (×8): 2 g via INTRAVENOUS
  Filled 2018-11-09 (×10): qty 20

## 2018-11-09 MED ORDER — SODIUM CHLORIDE 0.9 % IV SOLN
INTRAVENOUS | Status: AC
  Administered 2018-11-09: 23:00:00 via INTRAVENOUS

## 2018-11-09 NOTE — Progress Notes (Signed)
85 year old male on chronic antibiotics through PICC line at home (Rocephin and ampicillin) for Enterococcal bacteremia, discitis and/or possible prosthetic valve endocarditis presenting with fever and pelvic/abdominal pain.  Work-up in the emergency room showed worsening L2-L3 discitis.  Case discussed with Dr. Drucilla Schmidt with ID who advised admission, MRI and IR consult for biopsy of spine. No leukocytosis noted, improvement in stage III chronic renal failure . Discuss with ID upon arrival to regular floor.

## 2018-11-09 NOTE — ED Notes (Signed)
ED Provider at bedside. 

## 2018-11-09 NOTE — H&P (Signed)
History and Physical    EL PILE OVF:[id number] DOB: Oct 09, 1927 DOA: 11/09/2018  PCP: [name], MD  Chief Complaint: Lower abdominal pain  HPI: Randy Chen is a 85 y.o. male with medical history significant of enterococcal bacteremia with concern for discitis and/or possible prosthetic valve endocarditis, currently on antibiotics through a PICC line at home (Rocephin and ampicillin), CAD status post CABG, prostate cancer, prostatectomy, hypertension, hyperlipidemia, aortic stenosis status post bioprosthetic aortic valve replacement, PAD, and conditions listed below presented to [facility] ED for evaluation of lower abdominal pain.  Patient states for the past 3 days he is having intermittent severe pain in his lower abdomen and lower back.  States the pain is worse whenever he tries to move.  Denies any fevers or chills at home.  Denies any nausea, vomiting, diarrhea, or dysuria.  States his last bowel movement was yesterday.  Denies any melena or hematochezia.  Denies saddle anesthesia, lower extremity weakness or numbness, or fecal/urinary incontinence.  States he has been getting antibiotics through his PICC line at home.  ED Course: T-max 100.4 F, tachycardic, tachypneic, not hypotensive,  not hypoxic.  No leukocytosis.  Lactic acid normal.  Hemoglobin 8.6, was in the 9-10 range a month ago.  Creatinine 1.3, improved since labs done a month ago.  COVID-19 rapid test negative.  UA not suggestive of infection. Blood culture x2 pending.  Urine culture pending.  Chest x-ray showing small calcified granuloma in the left lower lobe.  No edema or consolidation.  CT abdomen pelvis showing new erosion and sclerosis of the L2-L3 disc space and adjacent endplates with mild adjacent perivertebral fat stranding, consistent with discitis osteomyelitis. Case was discussed with Dr. Drucilla Schmidt with ID who advised admission, MRI, and IR consult for biopsy of spine.  Received Norco,  ampicillin, and a 500 cc IV fluid bolus in the ED.  Review of Systems:  All systems reviewed and apart from history of presenting illness, are negative.  Past Medical History:  Diagnosis Date  . Aortic stenosis   . Cancer ([place])   . Coronary artery disease    CABG x5 - 1987  . History of atherosclerotic cardiovascular disease   . History of prostatectomy   . Hyperlipidemia   . Hypertension   . PAD (peripheral artery disease) ([place]) 06/06/2017    Past Surgical History:  Procedure Laterality Date  . CARDIAC CATHETERIZATION     EF of 66%  . CARDIAC VALVE REPLACEMENT  2006  . CORONARY ARTERY BYPASS GRAFT  1987   x5  . I&D EXTREMITY Left 01/10/2015   Procedure: DEBRIDEMENT ANKLE PLACEMENT ANTIBIOTIC BEADS AND WOUND VAC;  Surgeon: [name], MD;  Location: [facility];  Service: Orthopedics;  Laterality: Left;     reports that he quit smoking about 63 years ago. He has never used smokeless tobacco. He reports that he does not drink alcohol or use drugs.  No Known Allergies  Family History  Problem Relation Age of Onset  . Heart disease Father   . Heart failure Mother   . Heart attack Brother   . Congestive Heart Failure Sister   . Congestive Heart Failure Sister     Prior to Admission medications   Medication Sig Start Date End Date Taking? Authorizing Provider  amLODipine (NORVASC) 5 MG tablet TAKE 1 TABLET BY MOUTH DAILY Patient taking differently: Take 5 mg by mouth daily.  07/29/18  Yes Skeet Latch, MD  ampicillin IVPB Inject 2 g into the  vein every 8 (eight) hours. Indication:  Enterococcal bacteremia Last Day of Therapy:  Nov 13, 2018 Labs - Once weekly:  CBC/D and BMP, Labs - Every other week:  ESR and CRP 10/02/18 11/15/18 Yes Swayze, Ava, DO  aspirin EC 81 MG tablet Take 81 mg by mouth daily.   Yes [provider]  atorvastatin (LIPITOR) 40 MG tablet Take 40 mg by mouth daily.   Yes [provider]  Calcium Carb-Cholecalciferol (CALCIUM 1000 + D  PO) Take 1,000 mg by mouth daily.   Yes [provider]  cefTRIAXone (ROCEPHIN) IVPB Inject 2 g into the vein every 12 (twelve) hours. Indication:  Enterococcal bacteremia  Last Day of Therapy:  Nov 13, 2018 Labs - Once weekly:  CBC/D and BMP, Labs - Every other week:  ESR and CRP 10/02/18 11/15/18 Yes Swayze, Ava, DO  Cholecalciferol (VITAMIN D) 2000 UNITS CAPS Take 2,000 Units by mouth daily.   Yes [provider]  cyclobenzaprine (FLEXERIL) 5 MG tablet Take 1 tablet (5 mg total) by mouth 2 (two) times daily. 10/02/18  Yes Swayze, Ava, DO  folic acid (FOLVITE) 889 MCG tablet Take 400 mcg by mouth daily.    Yes [provider]  HYDROcodone-acetaminophen (NORCO/VICODIN) 5-325 MG tablet Take 1-2 tablets by mouth every 4 (four) hours as needed for moderate pain. 10/02/18  Yes Swayze, Ava, DO  metoprolol succinate (TOPROL-XL) 50 MG 24 hr tablet TAKE 1 TABLET BY MOUTH DAILY. DUE FOR FOLLOW UP APPOINTMENT. KEEP OFFICE VISIT Patient taking differently: Take 50 mg by mouth daily.  02/21/18  Yes Skeet Latch, MD  Multiple Vitamin (MULTIVITAMIN WITH MINERALS) TABS Take 1 tablet by mouth daily. Theragran   Yes [provider]  timolol (TIMOPTIC) 0.5 % ophthalmic solution Place 1 drop into the right eye 2 (two) times daily.  11/07/17  Yes [provider]  vitamin C (ASCORBIC ACID) 500 MG tablet Take 500 mg by mouth daily.   Yes [provider]  nitroGLYCERIN (NITROSTAT) 0.4 MG SL tablet Place 1 tablet (0.4 mg total) under the tongue every 5 (five) minutes as needed for chest pain. 06/28/16 09/27/18  Skeet Latch, MD    Physical Exam: Vitals:   11/09/18 1630 11/09/18 1642 11/09/18 1843 11/09/18 1951  BP: (!) 149/65  (!) 153/98 (!) 171/80  Pulse:  (!) 109 (!) 130 100  Resp:  (!) 23 (!) 22   Temp:   98.2 F (36.8 C) 98.3 F (36.8 C)  TempSrc:   Oral Oral  SpO2:  97% 98% 98%  Weight:      Height:        Physical Exam  Constitutional: He is  oriented to person, place, and time. He appears well-developed and well-nourished. No distress.  Resting comfortably in bed watching television.  No signs of distress.  HENT:  Head: Normocephalic.  Mouth/Throat: Oropharynx is clear and moist.  Eyes: Right eye exhibits no discharge. Left eye exhibits no discharge.  Neck: Neck supple.  Cardiovascular: Regular rhythm and intact distal pulses.  Slightly tachycardic  Pulmonary/Chest: Effort normal and breath sounds normal. No respiratory distress. He has no wheezes. He has no rales.  Abdominal: Soft. Bowel sounds are normal. He exhibits no distension. There is no abdominal tenderness. There is no rebound and no guarding.  Musculoskeletal:        General: No edema.  Neurological: He is alert and oriented to person, place, and time.  Strength 5 out of 5 in bilateral lower extremities.  Sensation  to light touch intact in bilateral lower extremities.  Skin: Skin is warm and dry. He is not diaphoretic.     Labs on Admission: I have personally reviewed following labs and imaging studies  CBC: Recent Labs  Lab 11/09/18 1233  WBC 7.9  NEUTROABS 5.9  HGB 8.6*  HCT 27.5*  MCV 89.6  PLT 325   Basic Metabolic Panel: Recent Labs  Lab 11/09/18 1233  NA 134*  K 3.6  CL 99  CO2 24  GLUCOSE 122*  BUN 18  CREATININE 1.32*  CALCIUM 8.3*   GFR: Estimated Creatinine Clearance: 40 mL/min (A) (by C-G formula based on SCr of 1.32 mg/dL (H)). Liver Function Tests: Recent Labs  Lab 11/09/18 1233  AST 25  ALT 18  ALKPHOS 94  BILITOT 0.4  PROT 6.3*  ALBUMIN 3.0*   No results for input(s): LIPASE, AMYLASE in the last 168 hours. No results for input(s): AMMONIA in the last 168 hours. Coagulation Profile: No results for input(s): INR, PROTIME in the last 168 hours. Cardiac Enzymes: No results for input(s): CKTOTAL, CKMB, CKMBINDEX, TROPONINI in the last 168 hours. BNP (last 3 results) No results for input(s): PROBNP in the last 8760  hours. HbA1C: No results for input(s): HGBA1C in the last 72 hours. CBG: No results for input(s): GLUCAP in the last 168 hours. Lipid Profile: No results for input(s): CHOL, HDL, LDLCALC, TRIG, CHOLHDL, LDLDIRECT in the last 72 hours. Thyroid Function Tests: No results for input(s): TSH, T4TOTAL, FREET4, T3FREE, THYROIDAB in the last 72 hours. Anemia Panel: No results for input(s): VITAMINB12, FOLATE, FERRITIN, TIBC, IRON, RETICCTPCT in the last 72 hours. Urine analysis:    Component Value Date/Time   COLORURINE YELLOW 11/09/2018 1442   APPEARANCEUR CLEAR 11/09/2018 1442   LABSPEC 1.015 11/09/2018 1442   PHURINE 7.0 11/09/2018 1442   GLUCOSEU NEGATIVE 11/09/2018 1442   HGBUR NEGATIVE 11/09/2018 1442   BILIRUBINUR NEGATIVE 11/09/2018 1442   KETONESUR NEGATIVE 11/09/2018 1442   PROTEINUR NEGATIVE 11/09/2018 1442   UROBILINOGEN 0.2 01/09/2015 1853   NITRITE NEGATIVE 11/09/2018 1442   LEUKOCYTESUR NEGATIVE 11/09/2018 1442    Radiological Exams on Admission: Ct Abdomen Pelvis W Contrast  Result Date: 11/09/2018 CLINICAL DATA:  Pelvic pain, low back pain EXAM: CT ABDOMEN AND PELVIS WITH CONTRAST TECHNIQUE: Multidetector CT imaging of the abdomen and pelvis was performed using the standard protocol following bolus administration of intravenous contrast. CONTRAST:  75m OMNIPAQUE IOHEXOL 300 MG/ML  SOLN COMPARISON:  09/25/2018, 06/24/2012 FINDINGS: Lower chest: No acute abnormality. Redemonstrated lobulated 1.1 cm nodule of the right lower lobe, unchanged from recent prior examination and very slightly enlarged compared to remote prior examination dated 06/24/2012. Extensive 3 vessel coronary artery calcifications and/or stents, status post aortic valve replacement. Mild bibasilar fibrotic change. Hepatobiliary: No focal liver abnormality is seen. The gallbladder is distended with sludge. No gallbladder wall thickening, or biliary dilatation. Pancreas: Unremarkable. No pancreatic ductal  dilatation or surrounding inflammatory changes. Spleen: Normal in size without focal abnormality. Adrenals/Urinary Tract: Adrenal glands are unremarkable. Kidneys are normal, without renal calculi, focal lesion, or hydronephrosis. Bladder is unremarkable. Stomach/Bowel: Stomach is within normal limits. Appendix appears normal. No evidence of bowel wall thickening, distention, or inflammatory changes. Moderate burden of stool in the left and right colon. Sigmoid diverticulosis. Vascular/Lymphatic: Severe calcific atherosclerosis. No enlarged abdominal or pelvic lymph nodes. Reproductive: Status post prostatectomy. Other: No abdominal wall hernia or abnormality. No abdominopelvic ascites. Musculoskeletal: There is new erosion and sclerosis of the L2-L3 disc space  and adjacent endplates with mild adjacent perivertebral fat stranding (series 6, image 61, series 7, image 23). IMPRESSION: 1. There is new erosion and sclerosis of the L2-L3 disc space and adjacent endplates with mild adjacent perivertebral fat stranding (series 6, image 61, series 7, image 23), consistent with discitis osteomyelitis. Consider contrast enhanced MRI to more sensitively evaluate for disc space and epidural abscess. 2. Redemonstrated lobulated 1.1 cm nodule of the right lower lobe, unchanged from recent prior examination and very slightly enlarged compared to remote prior examination dated 06/24/2012. As on prior examination, if clinically appropriate consider follow-up CT in 3-6 months to ensure stability. 3. Other chronic, incidental, and postoperative findings as detailed above. Electronically Signed   By: Eddie Candle M.D.   On: 11/09/2018 14:48   Dg Chest Port 1 View  Result Date: 11/09/2018 CLINICAL DATA:  Abdominal pain with concern for sepsis EXAM: PORTABLE CHEST 1 VIEW COMPARISON:  September 12, 2004 FINDINGS: There is a small calcified granuloma in the left base. There is no edema or consolidation. Heart size and pulmonary vascularity  are normal. Patient is status post coronary artery bypass grafting. There is aortic atherosclerosis. No adenopathy. There old healed rib fractures on the right. IMPRESSION: Small calcified granuloma left lower lobe. No edema or consolidation. Heart size within normal limits. Postoperative changes noted. Aortic Atherosclerosis (ICD10-I70.0). Electronically Signed   By: Lowella Grip III M.D.   On: 11/09/2018 12:44    EKG: Independently reviewed.  Sinus rhythm, PVCs.  T wave abnormality in leads III and aVF similar to prior tracing from April 2020.  Assessment/Plan Principal Problem:   Osteomyelitis (HCC) Active Problems:   Chronic anemia   Pulmonary nodule   Bacteremia due to Enterococcus   Discitis   L2-L3 discitis/osteomyelitis, suspect secondary to enterococcal bacteremia Patient has a history of enterococcal bacteremia with concern for discitis and/or possible prosthetic valve endocarditis. Admitted to the hospital from April 8 to April 15 and discharged on a 6-week course of Rocephin and ampicillin via PICC line.  Now presenting with a 3-day history of intermittent, severe pain in his lower abdomen and lower back.  Febrile in the ED.  Slightly tachycardic.  No leukocytosis.  Lactic acid normal. CT abdomen pelvis showing new erosion and sclerosis of the L2-L3 disc space and adjacent endplates with mild adjacent perivertebral fat stranding, consistent with discitis osteomyelitis.  No red flags such as saddle anesthesia, lower extremity weakness/numbness, or urinary/fecal incontinence. -I have discussed with ID.  Recommendation is to obtain an MRI of the lumbar spine and consult interventional radiology in the morning for spine biopsy.  ID will consult in a.m. -MRI lumbar spine with contrast -Consult IR in a.m. for biopsy -Keep n.p.o. after midnight -Continue Rocephin and ampicillin -IV morphine 1 mg every 3 hours as needed for severe pain, Norco 5-325 mg 1 to 2 tablets every 6 hours PRN,  Tylenol PRN -PT and OT evaluation -Blood culture x2 pending  CKD stage III Creatinine 1.3, improved since labs done a month ago.  -Received 500 cc IV fluid in the ED.  Continue IV fluid hydration as patient will receive contrast for the MRI. -Continue to monitor BMP  Chronic anemia Hemoglobin 8.6, was in the 9-10 range a month ago.  Patient denies any melena or hematochezia. -Continue to monitor CBC  Pulmonary nodule CT showing lobulated 1.1 cm nodule of the right lower lobe, unchanged from recent prior examination and very slightly enlarged compared to remote prior examination dated 06/24/2012.  Patient  is a former smoker. -Patient will need a follow-up CT in 3 to 6 months to ensure stability.  Hypertension -Continue amlodipine, metoprolol  CAD status post CABG -Continue aspirin, statin, beta-blocker  Hyperlipidemia -Continue Lipitor  DVT prophylaxis: SCDs at this time.  Anticipate procedure for spine biopsy in a.m. Code Status: Patient wishes to be DNR. Family Communication: No family available. Disposition Plan: Anticipate discharge after clinical improvement. Consults called: Infectious disease (Dr. Drucilla Schmidt) Admission status: It is my clinical opinion that admission to [facility] is reasonable and necessary in this 85 y.o. male . presenting with symptoms of lower abdominal and lower back pain concerning for lumbar discitis/osteomyelitis . in the context of PMH including: Enterococcal bacteremia . with pertinent positives on physical exam including: Fever, tachycardia, tachypnea in the ED. [name] and pertinent positives on radiographic and laboratory data including: CT with evidence of lumbar discitis/osteomyelitis. . Workup and treatment include IV antibiotics, lumbar MRI, infectious disease consultation, and interventional radiology consultation in the morning for spine biopsy.  Given the aforementioned, the predictability of an adverse outcome is felt to be significant. I expect  that the patient will require at least 2 midnights in the hospital to treat this condition.   The medical decision making on this patient was of high complexity and the patient is at high risk for clinical deterioration, therefore this is a level 3 visit.  Shela Leff MD [facility] Pager [phone]  If 7PM-7AM, please contact night-coverage [email or web address] Password [facility]  11/09/2018, 9:55 PM

## 2018-11-09 NOTE — ED Notes (Signed)
Mrs. Abdulwahab Demelo, patient's wife, was notified of room assignment.

## 2018-11-09 NOTE — ED Notes (Signed)
Leaving with [facility] at this time. 

## 2018-11-09 NOTE — ED Notes (Signed)
Received patient with RUA pick line.  Site is intact with good blood return.

## 2018-11-09 NOTE — Progress Notes (Signed)
Pharmacy Antibiotic Note  Randy Chen is a 85 y.o. male admitted on 11/09/2018 with abdominal pain. It is noted that the patient has been receiving Ampicillin + Rocephin for Enterococcal bacteremia for a planned duration of 6 weeks thru 11/10/18. Pharmacy has been consulted for antibiotics for r/o sepsis.   Per discussion with MD Ralene Bathe) - plan is to resume the Ampicillin and Rocephin at this time. The patient's last doses of both were around 8-9 this morning per RN.  Plan: - Continue Ampicillin 2g IV every 8 hours - Continue Rocephin 2g IV every 12 hours - Will f/u new cultures and antibiotic plans  Height: 5\' 7"  (170.2 cm) IBW/kg (Calculated) : 66.1  Temp (24hrs), Avg:100.2 F (37.9 C), Min:99.7 F (37.6 C), Max:100.4 F (38 C)  Recent Labs  Lab 11/09/18 1233  WBC 7.9  CREATININE 1.32*  LATICACIDVEN 1.8    Estimated Creatinine Clearance: 40 mL/min (A) (by C-G formula based on SCr of 1.32 mg/dL (H)).    No Known Allergies  Thank you for allowing pharmacy to be a part of this patient's care.  Alycia Rossetti, PharmD, BCPS Clinical Pharmacist Clinical phone for 11/09/2018: [phone] 11/09/2018 1:25 PM   **Pharmacist phone directory can now be found on [email or web address] ([email or web address]).  Listed under [place].

## 2018-11-09 NOTE — ED Triage Notes (Signed)
Abdominal pain (R/LLQ) radiating to his lower back for bladder infection. He is receiving antibiotic therapy since April 8th and was sent home on antibiotic therapy until May 27th.  The antibiotic therapy never helps the pain.

## 2018-11-09 NOTE — ED Provider Notes (Signed)
[facility] EMERGENCY DEPARTMENT Provider Note   CSN: [id number] Arrival date & time: 11/09/18  1152    History   Chief Complaint Chief Complaint  Patient presents with  . Abdominal Pain    HPI Randy Chen is a 85 y.o. male.     The history is provided by the patient and medical records. No language interpreter was used.  Abdominal Pain   Randy Chen is a 85 y.o. male who presents to the Emergency Department complaining of abdominal pain. He presents the emergency department complaining of progressive abdominal pain. He was admitted to the hospital in April for pelvic/lower abdominal pain and diagnosed with a kidney infection. He has been on PICC line antibiotics since that time. He was initially discharged to rehab and then had been transition to his apartment at [place]. He was initially doing well at home with home health. Over the last week he has experienced progressive worsening in his lower abdominal pain. He states that it is worse at night and wakes him around one in the morning. It is described as sharp and stabbing located low in his pelvis what he describes as behind his bladder. He has worsened pain with urination and one time the pain radiated to his left buttocks. His pain generally improves about one hour after waking up and getting mobile. Pain is initially worse with movement. He denies any fevers, nausea, vomiting, shortness of breath, chest pain, cough. He is receiving his PICC line antibiotics as prescribed. These are scheduled to be discontinued in five days. He has been taking his home pain medications with no significant improvement in his pain. Past Medical History:  Diagnosis Date  . Aortic stenosis   . Cancer ([place])   . Coronary artery disease    CABG x5 - 1987  . History of atherosclerotic cardiovascular disease   . History of prostatectomy   . Hyperlipidemia   . Hypertension   . PAD (peripheral artery disease) ([place]) 06/06/2017    Patient Active Problem List   Diagnosis Date Noted  . Discitis 11/09/2018  . Acute lower UTI 09/26/2018  . Enterococcal bacteremia   . AKI (acute kidney injury) ([place]) 09/25/2018  . Hypertension 09/25/2018  . History of atherosclerotic cardiovascular disease 09/25/2018  . Bladder retention 09/25/2018  . Anemia 09/25/2018  . Hyponatremia 09/25/2018  . Pulmonary nodule 09/25/2018  . PAD (peripheral artery disease) ([place]) 06/06/2017  . Fatigue 04/26/2017  . Claudication of both lower extremities ([place]) 04/26/2017  . Cellulitis of left lower leg 01/08/2015  . Cellulitis 01/08/2015  . S/P AVR 01/08/2015  . Chest discomfort 05/13/2014  . Chest pain 05/04/2014  . Diverticulosis 05/13/2013  . Vertigo 05/13/2013  . Colitis 06/23/2012  . Hx of CABG 11/18/2010  . History of prosthetic aortic valve 11/18/2010  . Benign hypertensive heart disease without heart failure 11/18/2010  . Hypercholesterolemia 11/18/2010  . History of prostate cancer 11/18/2010    Past Surgical History:  Procedure Laterality Date  . CARDIAC CATHETERIZATION     EF of 66%  . CARDIAC VALVE REPLACEMENT  2006  . CORONARY ARTERY BYPASS GRAFT  1987   x5  . I&D EXTREMITY Left 01/10/2015   Procedure: DEBRIDEMENT ANKLE PLACEMENT ANTIBIOTIC BEADS AND WOUND VAC;  Surgeon: [name], MD;  Location: [facility];  Service: Orthopedics;  Laterality: Left;        Home Medications    Prior to Admission medications   Medication Sig Start Date End Date Taking? Authorizing Provider  amLODipine (NORVASC) 5 MG tablet TAKE 1 TABLET BY MOUTH DAILY Patient taking differently: Take 5 mg by mouth daily.  07/29/18  Yes Skeet Latch, MD  ampicillin IVPB Inject 2 g into the vein every 8 (eight) hours. Indication:  Enterococcal bacteremia Last Day of Therapy:  Nov 13, 2018 Labs - Once weekly:  CBC/D and BMP, Labs - Every other week:  ESR and CRP 10/02/18 11/15/18 Yes Swayze, Ava, DO  aspirin EC 81 MG tablet Take 81 mg by mouth daily.    Yes [provider]  atorvastatin (LIPITOR) 40 MG tablet Take 40 mg by mouth daily.   Yes [provider]  Calcium Carb-Cholecalciferol (CALCIUM 1000 + D PO) Take 1,000 mg by mouth daily.   Yes [provider]  cefTRIAXone (ROCEPHIN) IVPB Inject 2 g into the vein every 12 (twelve) hours. Indication:  Enterococcal bacteremia  Last Day of Therapy:  Nov 13, 2018 Labs - Once weekly:  CBC/D and BMP, Labs - Every other week:  ESR and CRP 10/02/18 11/15/18 Yes Swayze, Ava, DO  Cholecalciferol (VITAMIN D) 2000 UNITS CAPS Take 2,000 Units by mouth daily.   Yes [provider]  cyclobenzaprine (FLEXERIL) 5 MG tablet Take 1 tablet (5 mg total) by mouth 2 (two) times daily. 10/02/18  Yes Swayze, Ava, DO  folic acid (FOLVITE) 762 MCG tablet Take 400 mcg by mouth daily.    Yes [provider]  HYDROcodone-acetaminophen (NORCO/VICODIN) 5-325 MG tablet Take 1-2 tablets by mouth every 4 (four) hours as needed for moderate pain. 10/02/18  Yes Swayze, Ava, DO  metoprolol succinate (TOPROL-XL) 50 MG 24 hr tablet TAKE 1 TABLET BY MOUTH DAILY. DUE FOR FOLLOW UP APPOINTMENT. KEEP OFFICE VISIT Patient taking differently: Take 50 mg by mouth daily.  02/21/18  Yes Skeet Latch, MD  Multiple Vitamin (MULTIVITAMIN WITH MINERALS) TABS Take 1 tablet by mouth daily. Theragran   Yes [provider]  timolol (TIMOPTIC) 0.5 % ophthalmic solution Place 1 drop into the right eye 2 (two) times daily.  11/07/17  Yes [provider]  vitamin C (ASCORBIC ACID) 500 MG tablet Take 500 mg by mouth daily.   Yes [provider]  nitroGLYCERIN (NITROSTAT) 0.4 MG SL tablet Place 1 tablet (0.4 mg total) under the tongue every 5 (five) minutes as needed for chest pain. 06/28/16 09/27/18  Skeet Latch, MD    Family History Family History  Problem Relation Age of Onset  . Heart disease Father   . Heart failure Mother   . Heart attack Brother   . Congestive Heart  Failure Sister   . Congestive Heart Failure Sister     Social History Social History   Tobacco Use  . Smoking status: Former Smoker    Last attempt to quit: 11/18/1955    Years since quitting: 63.0  . Smokeless tobacco: Never Used  Substance Use Topics  . Alcohol use: No  . Drug use: No     Allergies   Patient has no known allergies.   Review of Systems Review of Systems  Gastrointestinal: Positive for abdominal pain.  All other systems reviewed and are negative.    Physical Exam Updated Vital Signs BP (!) 141/81 (BP Location: Left Arm)   Pulse (!) 116   Temp (!) 100.4 F (38 C) (Rectal)   Resp 20   Ht _0  (1.702 m)   Wt 83.5 kg   SpO2 97%   BMI 28.82 kg/m   Physical Exam Vitals signs and nursing  note reviewed.  Constitutional:      Appearance: He is well-developed.  HENT:     Head: Normocephalic and atraumatic.  Cardiovascular:     Rate and Rhythm: Regular rhythm.     Comments: Tachycardic Pulmonary:     Effort: Pulmonary effort is normal. No respiratory distress.  Abdominal:     Palpations: Abdomen is soft.     Tenderness: There is no abdominal tenderness. There is no guarding or rebound.  Genitourinary:    Penis: Normal.      Scrotum/Testes: Normal.  Musculoskeletal:        General: No swelling or tenderness.     Comments: Normal range of motion and bilateral hips. 2+ DP pulses bilaterally. PICC line in the right upper extremity.  Skin:    General: Skin is warm and dry.     Coloration: Skin is pale.     Findings: No rash.  Neurological:     Mental Status: He is alert and oriented to person, place, and time.  Psychiatric:        Mood and Affect: Mood normal.        Behavior: Behavior normal.      ED Treatments / Results  Labs (all labs ordered are listed, but only abnormal results are displayed) Labs Reviewed  COMPREHENSIVE METABOLIC PANEL - Abnormal; Notable for the following components:      Result Value   Sodium 134 (*)     Glucose, Bld 122 (*)    Creatinine, Ser 1.32 (*)    Calcium 8.3 (*)    Total Protein 6.3 (*)    Albumin 3.0 (*)    GFR calc non Af Amer 48 (*)    GFR calc Af Amer 55 (*)    All other components within normal limits  CBC WITH DIFFERENTIAL/PLATELET - Abnormal; Notable for the following components:   RBC 3.07 (*)    Hemoglobin 8.6 (*)    HCT 27.5 (*)    All other components within normal limits  SARS CORONAVIRUS 2 (HOSP ORDER, PERFORMED IN [facility] VIA [facility] ID)  CULTURE, BLOOD (ROUTINE X 2)  CULTURE, BLOOD (ROUTINE X 2)  URINE CULTURE  LACTIC ACID, PLASMA  URINALYSIS, ROUTINE W REFLEX MICROSCOPIC  LACTIC ACID, PLASMA    EKG EKG Interpretation  Date/Time:  Saturday Nov 09 2018 12:43:31 EDT Ventricular Rate:  96 PR Interval:    QRS Duration: 91 QT Interval:  347 QTC Calculation: 439 R Axis:   66 Text Interpretation:  Sinus tachycardia Multiple ventricular premature complexes Borderline T abnormalities, inferior leads Confirmed by Quintella Reichert [phone]) on 11/09/2018 2:56:42 PM   Radiology Ct Abdomen Pelvis W Contrast  Result Date: 11/09/2018 CLINICAL DATA:  Pelvic pain, low back pain EXAM: CT ABDOMEN AND PELVIS WITH CONTRAST TECHNIQUE: Multidetector CT imaging of the abdomen and pelvis was performed using the standard protocol following bolus administration of intravenous contrast. CONTRAST:  81m OMNIPAQUE IOHEXOL 300 MG/ML  SOLN COMPARISON:  09/25/2018, 06/24/2012 FINDINGS: Lower chest: No acute abnormality. Redemonstrated lobulated 1.1 cm nodule of the right lower lobe, unchanged from recent prior examination and very slightly enlarged compared to remote prior examination dated 06/24/2012. Extensive 3 vessel coronary artery calcifications and/or stents, status post aortic valve replacement. Mild bibasilar fibrotic change. Hepatobiliary: No focal liver abnormality is seen. The gallbladder is distended with sludge. No gallbladder wall thickening, or biliary dilatation.  Pancreas: Unremarkable. No pancreatic ductal dilatation or surrounding inflammatory changes. Spleen: Normal in size without focal abnormality. Adrenals/Urinary Tract: Adrenal  glands are unremarkable. Kidneys are normal, without renal calculi, focal lesion, or hydronephrosis. Bladder is unremarkable. Stomach/Bowel: Stomach is within normal limits. Appendix appears normal. No evidence of bowel wall thickening, distention, or inflammatory changes. Moderate burden of stool in the left and right colon. Sigmoid diverticulosis. Vascular/Lymphatic: Severe calcific atherosclerosis. No enlarged abdominal or pelvic lymph nodes. Reproductive: Status post prostatectomy. Other: No abdominal wall hernia or abnormality. No abdominopelvic ascites. Musculoskeletal: There is new erosion and sclerosis of the L2-L3 disc space and adjacent endplates with mild adjacent perivertebral fat stranding (series 6, image 61, series 7, image 23). IMPRESSION: 1. There is new erosion and sclerosis of the L2-L3 disc space and adjacent endplates with mild adjacent perivertebral fat stranding (series 6, image 61, series 7, image 23), consistent with discitis osteomyelitis. Consider contrast enhanced MRI to more sensitively evaluate for disc space and epidural abscess. 2. Redemonstrated lobulated 1.1 cm nodule of the right lower lobe, unchanged from recent prior examination and very slightly enlarged compared to remote prior examination dated 06/24/2012. As on prior examination, if clinically appropriate consider follow-up CT in 3-6 months to ensure stability. 3. Other chronic, incidental, and postoperative findings as detailed above. Electronically Signed   By: Eddie Candle M.D.   On: 11/09/2018 14:48   Dg Chest Port 1 View  Result Date: 11/09/2018 CLINICAL DATA:  Abdominal pain with concern for sepsis EXAM: PORTABLE CHEST 1 VIEW COMPARISON:  September 12, 2004 FINDINGS: There is a small calcified granuloma in the left base. There is no edema or  consolidation. Heart size and pulmonary vascularity are normal. Patient is status post coronary artery bypass grafting. There is aortic atherosclerosis. No adenopathy. There old healed rib fractures on the right. IMPRESSION: Small calcified granuloma left lower lobe. No edema or consolidation. Heart size within normal limits. Postoperative changes noted. Aortic Atherosclerosis (ICD10-I70.0). Electronically Signed   By: Lowella Grip III M.D.   On: 11/09/2018 12:44    Procedures Procedures (including critical care time)  Medications Ordered in ED Medications  ampicillin (OMNIPEN) 2 g in sodium chloride 0.9 % 100 mL IVPB (has no administration in time range)  cefTRIAXone (ROCEPHIN) 2 g in sodium chloride 0.9 % 100 mL IVPB (has no administration in time range)  HYDROcodone-acetaminophen (NORCO/VICODIN) 5-325 MG per tablet 1 tablet (1 tablet Oral Given 11/09/18 1255)  iohexol (OMNIPAQUE) 300 MG/ML solution 100 mL (80 mLs Intravenous Contrast Given 11/09/18 1406)  sodium chloride 0.9 % bolus 500 mL (500 mLs Intravenous New Bag/Given 11/09/18 1426)     Initial Impression / Assessment and Plan / ED Course  I have reviewed the triage vital signs and the nursing notes.  Pertinent labs & imaging results that were available during my care of the patient were reviewed by me and considered in my medical decision making (see chart for details).       Pt with recent bacteremia on PICC line antibiotics.  He is nontoxic appearing on exam. Labs are significant for progressive anemia with improvement in his creatinine compared to priors. CT abdomen pelvis obtained today due to his worsening pain. CT is concerning for discitis/osteomyelitis at L2/L3. Discussed with [name] with infectious disease. Plan to admit to [facility] to the hospital service for further evaluation and workup. Patient and wife updated of findings of studies and recommendation for admission and they are in agreement with plan.  Hospitalist consulted for admission.   Final Clinical Impressions(s) / ED Diagnoses   Final diagnoses:  None  ED Discharge Orders    None       Quintella Reichert, MD 11/09/18 1527

## 2018-11-10 ENCOUNTER — Inpatient Hospital Stay (HOSPITAL_COMMUNITY): Payer: Medicare Other

## 2018-11-10 ENCOUNTER — Encounter (HOSPITAL_COMMUNITY): Payer: Self-pay | Admitting: Interventional Radiology

## 2018-11-10 DIAGNOSIS — R7881 Bacteremia: Secondary | ICD-10-CM

## 2018-11-10 DIAGNOSIS — Z952 Presence of prosthetic heart valve: Secondary | ICD-10-CM

## 2018-11-10 DIAGNOSIS — M4646 Discitis, unspecified, lumbar region: Secondary | ICD-10-CM

## 2018-11-10 DIAGNOSIS — B952 Enterococcus as the cause of diseases classified elsewhere: Secondary | ICD-10-CM

## 2018-11-10 DIAGNOSIS — K6812 Psoas muscle abscess: Secondary | ICD-10-CM

## 2018-11-10 DIAGNOSIS — D649 Anemia, unspecified: Secondary | ICD-10-CM

## 2018-11-10 DIAGNOSIS — M4626 Osteomyelitis of vertebra, lumbar region: Principal | ICD-10-CM

## 2018-11-10 DIAGNOSIS — T826XXA Infection and inflammatory reaction due to cardiac valve prosthesis, initial encounter: Secondary | ICD-10-CM

## 2018-11-10 DIAGNOSIS — Z87891 Personal history of nicotine dependence: Secondary | ICD-10-CM

## 2018-11-10 DIAGNOSIS — I38 Endocarditis, valve unspecified: Secondary | ICD-10-CM

## 2018-11-10 HISTORY — PX: IR LUMBAR DISC ASPIRATION W/IMG GUIDE: IMG5306

## 2018-11-10 LAB — BASIC METABOLIC PANEL
Anion gap: 9 (ref 5–15)
BUN: 13 mg/dL (ref 8–23)
CO2: 24 mmol/L (ref 22–32)
Calcium: 8 mg/dL — ABNORMAL LOW (ref 8.9–10.3)
Chloride: 103 mmol/L (ref 98–111)
Creatinine, Ser: 1.16 mg/dL (ref 0.61–1.24)
GFR calc Af Amer: >60 mL/min (ref >60)
GFR calc non Af Amer: 56 mL/min — ABNORMAL LOW (ref >60)
Glucose, Bld: 89 mg/dL (ref 70–99)
Potassium: 3.3 mmol/L — ABNORMAL LOW (ref 3.5–5.1)
Sodium: 136 mmol/L (ref 135–145)

## 2018-11-10 LAB — CBC
HCT: 24.1 % — ABNORMAL LOW (ref 39.0–52.0)
Hemoglobin: 7.7 g/dL — ABNORMAL LOW (ref 13.0–17.0)
MCH: 28 pg (ref 26.0–34.0)
MCHC: 32 g/dL (ref 30.0–36.0)
MCV: 87.6 fL (ref 80.0–100.0)
Platelets: 245 10*3/uL (ref 150–400)
RBC: 2.75 MIL/uL — ABNORMAL LOW (ref 4.22–5.81)
RDW: 13.7 % (ref 11.5–15.5)
WBC: 6.5 10*3/uL (ref 4.0–10.5)
nRBC: 0 % (ref 0.0–0.2)

## 2018-11-10 LAB — SURGICAL PCR SCREEN
MRSA, PCR: NEGATIVE
Staphylococcus aureus: NEGATIVE

## 2018-11-10 LAB — PROTIME-INR
INR: 1.1 (ref 0.8–1.2)
Prothrombin Time: 14.5 seconds (ref 11.4–15.2)

## 2018-11-10 MED ORDER — MIDAZOLAM HCL 2 MG/2ML IJ SOLN
INTRAMUSCULAR | Status: AC
  Filled 2018-11-10: qty 2

## 2018-11-10 MED ORDER — LIDOCAINE HCL 1 % IJ SOLN
INTRAMUSCULAR | Status: AC
  Filled 2018-11-10: qty 20

## 2018-11-10 MED ORDER — MIDAZOLAM HCL 2 MG/2ML IJ SOLN
INTRAMUSCULAR | Status: AC | PRN
  Administered 2018-11-10: 0.5 mg via INTRAVENOUS
  Administered 2018-11-10: 1 mg via INTRAVENOUS

## 2018-11-10 MED ORDER — LIDOCAINE HCL 1 % IJ SOLN
INTRAMUSCULAR | Status: AC | PRN
  Administered 2018-11-10: 10 mL

## 2018-11-10 MED ORDER — FENTANYL CITRATE (PF) 100 MCG/2ML IJ SOLN
INTRAMUSCULAR | Status: AC | PRN
  Administered 2018-11-10: 50 ug via INTRAVENOUS
  Administered 2018-11-10: 25 ug via INTRAVENOUS

## 2018-11-10 MED ORDER — FENTANYL CITRATE (PF) 100 MCG/2ML IJ SOLN
INTRAMUSCULAR | Status: AC
  Filled 2018-11-10: qty 2

## 2018-11-10 MED ORDER — GADOBUTROL 1 MMOL/ML IV SOLN
8.0000 mL | Freq: Once | INTRAVENOUS | Status: AC | PRN
  Administered 2018-11-10: 01:00:00 8 mL via INTRAVENOUS

## 2018-11-10 NOTE — Progress Notes (Signed)
PROGRESS NOTE    Randy Chen  XHB:[id number] DOB: [birth year] DOA: 11/09/2018 PCP: [name], MD    Brief Narrative:  85 y.o. male with medical history significant of enterococcal bacteremia with concern for discitis and/or possible prosthetic valve endocarditis, currently on antibiotics through a PICC line at home (Rocephin and ampicillin), CAD status post CABG, prostate cancer, prostatectomy, hypertension, hyperlipidemia, aortic stenosis status post bioprosthetic aortic valve replacement, PAD, and conditions listed below presented to [facility] for evaluation of lower abdominal pain.  Patient states for the past 3 days he is having intermittent severe pain in his lower abdomen and lower back.  States the pain is worse whenever he tries to move.  Denies any fevers or chills at home.  Denies any nausea, vomiting, diarrhea, or dysuria.  States his last bowel movement was yesterday.  Denies any melena or hematochezia.  Denies saddle anesthesia, lower extremity weakness or numbness, or fecal/urinary incontinence.  States he has been getting antibiotics through his PICC line at home.  Assessment & Plan:   Principal Problem:   Osteomyelitis ([place]) Active Problems:   Chronic anemia   Pulmonary nodule   Bacteremia due to Enterococcus   Discitis  L2-L3 discitis/osteomyelitis -suspect secondary to enterococcal bacteremia -Patient has a recent history of enterococcal bacteremia with concern for discitis and/or possible prosthetic valve endocarditis from an admission from April 8 to April 15 and discharged on a 6-week course of Rocephin and ampicillin via PICC line. -CT abdomen pelvis showing new erosion and sclerosis of the L2-L3 disc space and adjacent endplates with mild adjacent perivertebral fat stranding, consistent with discitis osteomyelitis.  -Admitting physician discussed with ID who recommended MRI of the lumbar spine and interventional radiology consult for spine  biopsy. -Biopsy obtained 5/24, results pending -MRI lumbar spine with contrast personally reviewed, with findings of L2-3 discitis and osteomyelitis -Continue Rocephin and ampicillin for now -continue with analgesics as needed -Blood culture x2 pending  CKD stage III Creatinine 1.3, improved since labs done a month ago.  -repeat BMP in AM  Chronic anemia -Hemoglobin 7.7 at present, was in the 9-10 range a month ago.  Patient denies any melena or hematochezia. -repeat cbc in AM  Pulmonary nodule -CT showing lobulated 1.1 cm nodule of the right lower lobe, unchanged from recent prior examination and very slightly enlarged compared to remote prior examination dated 06/24/2012.  Patient is a former smoker. -Patient will need a follow-up CT in 3 to 6 months to ensure stability.  Hypertension -Continue amlodipine, metoprolol as tolerated -BP currently suboptimally controlled  CAD status post CABG -Continue aspirin, statin, beta-blocker -Chest pain free at present  Hyperlipidemia -Continue Lipitor -Seems to be stable  DVT prophylaxis: SCD's Code Status: DNR Family Communication: Pt in room, family not at bedside Disposition Plan: Uncertain at this time  Consultants:   IR  ID  Procedures:   Lumbar spine biopsy 5/24  Antimicrobials: Anti-infectives (From admission, onward)   Start     Dose/Rate Route Frequency Ordered Stop   11/09/18 2100  cefTRIAXone (ROCEPHIN) 2 g in sodium chloride 0.9 % 100 mL IVPB     2 g 200 mL/hr over 30 Minutes Intravenous Every 12 hours 11/09/18 1322     11/09/18 1737  ampicillin (OMNIPEN) 2 g injection    Note to Pharmacy:  Danie Binder : cabinet override      11/09/18 1737 11/10/18 0544   11/09/18 1700  ampicillin (OMNIPEN) 2 g in sodium chloride 0.9 % 100  mL IVPB     2 g 300 mL/hr over 20 Minutes Intravenous Every 8 hours 11/09/18 1322         Subjective: Complaining of continued back pain  Objective: Vitals:    11/10/18 1227 11/10/18 1230 11/10/18 1235 11/10/18 1311  BP: (!) 183/93 (!) 160/91 (!) 171/85   Pulse: (!) 102 90 92 89  Resp: 16 17 20    Temp:      TempSrc:      SpO2: 100% 100% 100% 98%  Weight:      Height:        Intake/Output Summary (Last 24 hours) at 11/10/2018 1451 Last data filed at 11/10/2018 1100 Gross per 24 hour  Intake 1743.94 ml  Output 850 ml  Net 893.94 ml   Filed Weights   11/09/18 1413  Weight: 83.5 kg    Examination:  General exam: Appears calm and comfortable  Respiratory system: Clear to auscultation. Respiratory effort normal. Cardiovascular system: S1 & S2 heard, RRR. Gastrointestinal system: Abdomen is nondistended, soft and nontender. No organomegaly or masses felt. Normal bowel sounds heard. Central nervous system: Alert and oriented. No focal neurological deficits. Extremities: Symmetric 5 x 5 power. Skin: No rashes, lesions Psychiatry: Judgement and insight appear normal. Mood & affect appropriate.   Data Reviewed: I have personally reviewed following labs and imaging studies  CBC: Recent Labs  Lab 11/09/18 1233 11/10/18 0341  WBC 7.9 6.5  NEUTROABS 5.9  --   HGB 8.6* 7.7*  HCT 27.5* 24.1*  MCV 89.6 87.6  PLT 299 662   Basic Metabolic Panel: Recent Labs  Lab 11/09/18 1233 11/10/18 0341  NA 134* 136  K 3.6 3.3*  CL 99 103  CO2 24 24  GLUCOSE 122* 89  BUN 18 13  CREATININE 1.32* 1.16  CALCIUM 8.3* 8.0*   GFR: Estimated Creatinine Clearance: 45.5 mL/min (by C-G formula based on SCr of 1.16 mg/dL). Liver Function Tests: Recent Labs  Lab 11/09/18 1233  AST 25  ALT 18  ALKPHOS 94  BILITOT 0.4  PROT 6.3*  ALBUMIN 3.0*   No results for input(s): LIPASE, AMYLASE in the last 168 hours. No results for input(s): AMMONIA in the last 168 hours. Coagulation Profile: Recent Labs  Lab 11/10/18 0940  INR 1.1   Cardiac Enzymes: No results for input(s): CKTOTAL, CKMB, CKMBINDEX, TROPONINI in the last 168 hours. BNP (last 3  results) No results for input(s): PROBNP in the last 8760 hours. HbA1C: No results for input(s): HGBA1C in the last 72 hours. CBG: No results for input(s): GLUCAP in the last 168 hours. Lipid Profile: No results for input(s): CHOL, HDL, LDLCALC, TRIG, CHOLHDL, LDLDIRECT in the last 72 hours. Thyroid Function Tests: No results for input(s): TSH, T4TOTAL, FREET4, T3FREE, THYROIDAB in the last 72 hours. Anemia Panel: No results for input(s): VITAMINB12, FOLATE, FERRITIN, TIBC, IRON, RETICCTPCT in the last 72 hours. Sepsis Labs: Recent Labs  Lab 11/09/18 1233  LATICACIDVEN 1.8    Recent Results (from the past 240 hour(s))  Blood Culture (routine x 2)     Status: None (Preliminary result)   Collection Time: 11/09/18 12:30 PM  Result Value Ref Range Status   Specimen Description   Final    BLOOD RIGHT ARM Performed at [facility], [address] [place], [place] [address]    Special Requests   Final    BOTTLES DRAWN AEROBIC AND ANAEROBIC Blood Culture adequate volume Performed at [facility], [address],  [place], [place] [address]    Culture   Final    NO GROWTH < 24 HOURS Performed at [facility], [place] [address], [place], [place] [address]    Report Status PENDING  Incomplete  Blood Culture (routine x 2)     Status: None (Preliminary result)   Collection Time: 11/09/18 12:45 PM  Result Value Ref Range Status   Specimen Description   Final    BLOOD LEFT ARM Performed at [facility], [address] [place], [place] [address]    Special Requests   Final    BOTTLES DRAWN AEROBIC AND ANAEROBIC Blood Culture adequate volume Performed at [facility], [address] [place], [place] [address]    Culture   Final    NO GROWTH < 24 HOURS Performed at [facility], [place] [address] [place], [place] [address]    Report Status PENDING  Incomplete  SARS Coronavirus 2 (Hosp order,Performed in [facility] lab via [employer]  ID)     Status: None   Collection Time: 11/09/18 12:49 PM  Result Value Ref Range Status   SARS Coronavirus 2 ([employer] ID Now) NEGATIVE NEGATIVE Final    Comment: (NOTE) Interpretive Result Comment(s): COVID 19 Positive SARS CoV 2 target nucleic acids are DETECTED. The SARS CoV 2 RNA is generally detectable in upper and lower respiratory specimens during the acute phase of infection.  Positive results are indicative of active infection with SARS CoV 2.  Clinical correlation with patient history and other diagnostic information is necessary to determine patient infection status.  Positive results do not rule out bacterial infection or coinfection with other viruses. The expected result is Negative. COVID 19 Negative SARS CoV 2 target nucleic acids are NOT DETECTED. The SARS CoV 2 RNA is generally detectable in upper and lower respiratory specimens during the acute phase of infection.  Negative results do not preclude SARS CoV 2 infection, do not rule out coinfections with other pathogens, and should not be used as the sole basis for treatment or other patient management decisions.  Negative results must be combined with clinical  observations, patient history, and epidemiological information. The expected result is Negative. Invalid Presence or absence of SARS CoV 2 nucleic acids cannot be determined. Repeat testing was performed on the submitted specimen and repeated Invalid results were obtained.  If clinically indicated, additional testing on a new specimen with an alternate test methodology [phone]) is advised.  The SARS CoV 2 RNA is generally detectable in upper and lower respiratory specimens during the acute phase of infection. The expected result is Negative. Fact Sheet for Patients:  GolfingFamily.no Fact Sheet for Healthcare Providers: https://www.hernandez-brewer.com/ This test is not yet approved or cleared by the [place] FDA  and has been authorized for detection and/or diagnosis of SARS CoV 2 by FDA under an Emergency Use Authorization (EUA).  This EUA will remain in effect (meaning this test can be used) for the duration of the COVID19 d eclaration under Section 564(b)(1) of the Act, 21 U.S.C. section 8450684606 3(b)(1), unless the authorization is terminated or revoked sooner. Performed at [facility], [address] [place], [place] [address]   Surgical PCR screen     Status: None   Collection Time: 11/10/18  5:58 AM  Result Value Ref Range Status   MRSA, PCR NEGATIVE NEGATIVE Final   Staphylococcus aureus NEGATIVE NEGATIVE Final    Comment: (NOTE) The Xpert SA Assay (  FDA approved for NASAL specimens in patients 40 years of age and older), is one component of a comprehensive surveillance program. It is not intended to diagnose infection nor to guide or monitor treatment. Performed at [facility], [place] [address] [place], [place] [address]   Aerobic/Anaerobic Culture (surgical/deep wound)     Status: None (Preliminary result)   Collection Time: 11/10/18 12:40 PM  Result Value Ref Range Status   Specimen Description NEEDLE ASPIRATE DISC  Final   Special Requests NONE  Final   Gram Stain   Final    RARE WBC PRESENT, PREDOMINANTLY PMN NO ORGANISMS SEEN Performed at [facility], [place] [address], [place], [place] [address]    Culture PENDING  Incomplete   Report Status PENDING  Incomplete     Radiology Studies: Mr Lumbar Spine W Wo Contrast  Result Date: 11/10/2018 CLINICAL DATA:  85 y/o M; history of Enterococcus bacteremia and concern for discitis and/or possible prosthetic valve endocarditis. Severe pain of the lower abdomen and lower back. EXAM: MRI LUMBAR SPINE WITHOUT AND WITH CONTRAST TECHNIQUE: Multiplanar and multiecho pulse sequences of the lumbar spine were obtained without and with intravenous contrast. CONTRAST:  6 cc Gadavist COMPARISON:  11/09/2018 CT the abdomen  and pelvis. 09/26/2018 lumbar spine MRI. FINDINGS: Segmentation:  Standard. Alignment:  Physiologic. Vertebrae: L2-3 diffuse vertebral body enhancement, rim enhancing collection within the intervertebral disc space, and opposing endplate erosive changes compatible with acute discitis osteomyelitis. Mild anterior loss of the L2 vertebral body height. The disc abscess extends into the right psoas muscle with there is an 11 mm collection (series 11, image 16). There is anterior epidural thickening and enhancement extending from L2-L4. There is decreased T1 signal and enhancement within the dorsal aspect of the L4 vertebral body likely representing developing osteomyelitis from contiguous spread. Conus medullaris and cauda equina: Conus extends to the L1 level. Conus and cauda equina appear normal. Paraspinal and other soft tissues: Extensive paravertebral edema at the level of discitis osteomyelitis. Diffuse enhancement of the right greater than left psoas muscles as well as the left-greater-than-right paraspinal muscles compatible with myositis. Disc levels: Stable multilevel disc and facet degenerative changes greatest at the L4-5 level with there is moderate multifactorial spinal canal stenosis and at the L5-S1 level with there is moderate bilateral neural foraminal stenosis. Increases spinal canal stenosis at the L2-3 and L3-4 levels. Spinal canal stenosis is mild L2-3 and moderate L3-4. IMPRESSION: 1. L2-3 acute discitis osteomyelitis. Erosive changes of opposing endplates and mild anterior loss of height of L2 vertebral body. 2. Anterior epidural inflammation from L2-L4 with soft tissue thickening, no discrete abscess at this time. Enhancement within dorsal L4 vertebral body likely represents developing osteomyelitis from epidural spread. 3. Small right psoas muscle abscess. Extensive inflammation of bilateral psoas muscles and lumbar paraspinal muscles likely representing a combination of myositis and muscle  strain. 4. Mild L2-3 and moderate L3-4 spinal canal stenosis due to thickening of the anterior epidural soft tissues combined with lumbar spondylosis. 5. Stable background of lumbar spondylosis greatest at the L4-5 and L5-S1 levels. Electronically Signed   By: Kristine Garbe M.D.   On: 11/10/2018 01:30   Ct Abdomen Pelvis W Contrast  Result Date: 11/09/2018 CLINICAL DATA:  Pelvic pain, low back pain EXAM: CT ABDOMEN AND PELVIS WITH CONTRAST TECHNIQUE: Multidetector CT imaging of the abdomen and pelvis was performed using the standard protocol following bolus administration of intravenous contrast. CONTRAST:  53mL OMNIPAQUE IOHEXOL 300 MG/ML  SOLN COMPARISON:  09/25/2018, 06/24/2012 FINDINGS: Lower chest: No acute abnormality. Redemonstrated lobulated 1.1 cm nodule of the right lower lobe, unchanged from recent prior examination and very slightly enlarged compared to remote prior examination dated 06/24/2012. Extensive 3 vessel coronary artery calcifications and/or stents, status post aortic valve replacement. Mild bibasilar fibrotic change. Hepatobiliary: No focal liver abnormality is seen. The gallbladder is distended with sludge. No gallbladder wall thickening, or biliary dilatation. Pancreas: Unremarkable. No pancreatic ductal dilatation or surrounding inflammatory changes. Spleen: Normal in size without focal abnormality. Adrenals/Urinary Tract: Adrenal glands are unremarkable. Kidneys are normal, without renal calculi, focal lesion, or hydronephrosis. Bladder is unremarkable. Stomach/Bowel: Stomach is within normal limits. Appendix appears normal. No evidence of bowel wall thickening, distention, or inflammatory changes. Moderate burden of stool in the left and right colon. Sigmoid diverticulosis. Vascular/Lymphatic: Severe calcific atherosclerosis. No enlarged abdominal or pelvic lymph nodes. Reproductive: Status post prostatectomy. Other: No abdominal wall hernia or abnormality. No  abdominopelvic ascites. Musculoskeletal: There is new erosion and sclerosis of the L2-L3 disc space and adjacent endplates with mild adjacent perivertebral fat stranding (series 6, image 61, series 7, image 23). IMPRESSION: 1. There is new erosion and sclerosis of the L2-L3 disc space and adjacent endplates with mild adjacent perivertebral fat stranding (series 6, image 61, series 7, image 23), consistent with discitis osteomyelitis. Consider contrast enhanced MRI to more sensitively evaluate for disc space and epidural abscess. 2. Redemonstrated lobulated 1.1 cm nodule of the right lower lobe, unchanged from recent prior examination and very slightly enlarged compared to remote prior examination dated 06/24/2012. As on prior examination, if clinically appropriate consider follow-up CT in 3-6 months to ensure stability. 3. Other chronic, incidental, and postoperative findings as detailed above. Electronically Signed   By: Eddie Candle M.D.   On: 11/09/2018 14:48   Dg Chest Port 1 View  Result Date: 11/09/2018 CLINICAL DATA:  Abdominal pain with concern for sepsis EXAM: PORTABLE CHEST 1 VIEW COMPARISON:  September 12, 2004 FINDINGS: There is a small calcified granuloma in the left base. There is no edema or consolidation. Heart size and pulmonary vascularity are normal. Patient is status post coronary artery bypass grafting. There is aortic atherosclerosis. No adenopathy. There old healed rib fractures on the right. IMPRESSION: Small calcified granuloma left lower lobe. No edema or consolidation. Heart size within normal limits. Postoperative changes noted. Aortic Atherosclerosis (ICD10-I70.0). Electronically Signed   By: Lowella Grip III M.D.   On: 11/09/2018 12:44   Ir Lumbar Disc Aspiration W/img Guide  Result Date: 11/10/2018 INDICATION: Suspected discitis/osteomyelitis lumbar spine centered at L2-3. EXAM: LUMBAR L2-3 DISC ASPIRATION UNDER FLUOROSCOPY MEDICATIONS: Lidocaine 1% subcutaneous  ANESTHESIA/SEDATION: Intravenous Fentanyl 37mcg and Versed 1.5mg  were administered as conscious sedation during continuous monitoring of the patient's level of consciousness and physiological / cardiorespiratory status by the radiology RN, with a total moderate sedation time of 10 minutes. PROCEDURE: Informed written consent was obtained from the patient after a thorough discussion of the procedural risks, benefits and alternatives. All questions were addressed. Maximal Sterile Barrier Technique was utilized including caps, mask, sterile gowns, sterile gloves, sterile drape, hand hygiene and skin antiseptic. A timeout was performed prior to the initiation of the procedure. The appropriate interspace was identified under fluoroscopy, corresponding to previous cross-sectional imaging. An appropriate skin entry site was determined. After local infiltration with 1% lidocaine, a 17 gauge trocar needle was advanced into the interspace from right posterolateral extraforaminal approach. Needle tip position within the interspace confirmed on biplane images.  2 mL thin blood-tinged fluid were aspirated, sent for the requested laboratory studies. The patient tolerated the procedure well. FLUOROSCOPY TIME:  42 seconds; 42 mGy COMPLICATIONS: None immediate. IMPRESSION: Technically successful lumbar L2-3 disc aspiration under fluoroscopy. Electronically Signed   By: Lucrezia Europe M.D.   On: 11/10/2018 12:59    Scheduled Meds:  amLODipine  5 mg Oral Daily   aspirin EC  81 mg Oral Daily   atorvastatin  40 mg Oral Daily   cholecalciferol  2,000 Units Oral Daily   folic acid  588 mcg Oral Daily   lidocaine       metoprolol succinate  50 mg Oral Daily   multivitamin with minerals  1 tablet Oral Daily   timolol  1 drop Right Eye BID   vitamin C  500 mg Oral Daily   Continuous Infusions:  ampicillin (OMNIPEN) IV 2 g (11/10/18 0839)   cefTRIAXone (ROCEPHIN)  IV 2 g (11/10/18 0837)     LOS: 1 day   Marylu Lund, MD [facility] Pager On Amion  If 7PM-7AM, please contact night-coverage 11/10/2018, 2:51 PM

## 2018-11-10 NOTE — Progress Notes (Signed)
Occupational Therapy Evaluation Patient Details Name: Randy Chen MRN: [id number] DOB: 1927/07/29 Today's Date: 11/10/2018    History of Present Illness Patient is a 85 y/o male who presents with fever and abdominal pain- has hx of enterococcal bacteremia with concern for discitis and/or possible prosthetic valve endocarditis, currently on antibiotics through a PICC line at home. Chest abdomen-new erosion and sclerosis of the L2-L3 disc space and adjacent endplates with mild adjacent perivertebral fat stranding, consistent with discitis osteomyelitis. PMH includes aortic stenosis s/p CABG, prostate ca, HTN.   Clinical Impression   Pt presented with description above. PTA pt PLOF mod I with increased time and AE. Pt currently lives in assisted living with wife and received HHOT. Pt currently Mod I with LB ADLs, and Min guard functional mobility for safety. Pt will benefit from continued OT  to address pain and standing tolerance for ADL tasks. OT will continue to follow acutely to maximize independence in ADLs, education for ADL modification, and functional mobility. DC to [facility].    Follow Up Recommendations  Home health OT;Supervision/Assistance - 24 hour    Equipment Recommendations       Recommendations for Other Services       Precautions / Restrictions Precautions Precautions: Fall Restrictions Weight Bearing Restrictions: No      Mobility Bed Mobility Overal bed mobility: Needs Assistance Bed Mobility: Rolling;Sidelying to Sit Rolling: Supervision Sidelying to sit: Supervision;HOB elevated       General bed mobility comments: Cues for log roll technique and use of rail to return back in bed. Pt reports less pain and discomfort with sequence.  Transfers Overall transfer level: Needs assistance Equipment used: Rolling walker (2 wheeled) Transfers: Sit to/from Stand Sit to Stand: Min guard;From elevated surface         General transfer comment: Min guard for  safety.     Balance Overall balance assessment: Mild deficits observed, not formally tested                                         ADL either performed or assessed with clinical judgement   ADL Overall ADL's : Needs assistance/impaired Eating/Feeding: Independent   Grooming: Set up   Upper Body Bathing: Independent   Lower Body Bathing: Modified independent   Upper Body Dressing : Independent   Lower Body Dressing: Modified independent Lower Body Dressing Details (indicate cue type and reason): with increased time due to pain. Toilet Transfer: Magazine features editor Details (indicate cue type and reason): VCs for hand placement with RW and sit to stand transition for safety.         Functional mobility during ADLs: Min guard;Rolling walker;Cueing for sequencing General ADL Comments: Pt reports being I with ADLs. Pt demonstrated Mod I with LB dressing with increased due to back pain in seated position.      Vision         Perception     Praxis      Pertinent Vitals/Pain Pain Assessment: 0-10 Pain Score: 6  Pain Location: back Pain Descriptors / Indicators: Sore;Aching;Shooting Pain Intervention(s): Monitored during session     Hand Dominance Right   Extremity/Trunk Assessment Upper Extremity Assessment Upper Extremity Assessment: Generalized weakness   Lower Extremity Assessment Lower Extremity Assessment: Defer to PT evaluation   Cervical / Trunk Assessment Cervical / Trunk Assessment: Kyphotic   Communication Communication Communication: No difficulties  Cognition Arousal/Alertness: Awake/alert Behavior During Therapy: WFL for tasks assessed/performed Overall Cognitive Status: Within Functional Limits for tasks assessed                                     General Comments       Exercises     Shoulder Instructions      Home Living Family/patient expects to be discharged to:: Other (Comment)([facility])                                 Additional Comments: Pt reports living at assited living.      Prior Functioning/Environment Level of Independence: Independent with assistive device(s)        Comments: lives in [place] with his wife, I with mobility and ADLs. Has been using RW briefly due to pain. Is getting HHPT.        OT Problem List: Impaired balance (sitting and/or standing);Pain      OT Treatment/Interventions: Self-care/ADL training;Therapeutic exercise;DME and/or AE instruction;Therapeutic activities;Patient/family education;Balance training    OT Goals(Current goals can be found in the care plan section) Acute Rehab OT Goals Patient Stated Goal: to get this infection and pain to go away OT Goal Formulation: With patient Time For Goal Achievement: 11/24/18 Potential to Achieve Goals: Good  OT Frequency: Min 2X/week   Barriers to D/C:            Co-evaluation              AM-PAC OT "6 Clicks" Daily Activity     Outcome Measure Help from another person eating meals?: None Help from another person taking care of personal grooming?: None Help from another person toileting, which includes using toliet, bedpan, or urinal?: A Little Help from another person bathing (including washing, rinsing, drying)?: A Little Help from another person to put on and taking off regular upper body clothing?: None Help from another person to put on and taking off regular lower body clothing?: A Little 6 Click Score: 21   End of Session Equipment Utilized During Treatment: Gait belt;Rolling walker  Activity Tolerance: Patient tolerated treatment well Patient left: in bed;with call bell/phone within reach  OT Visit Diagnosis: Unsteadiness on feet (R26.81);Pain;Muscle weakness (generalized) (M62.81)                Time: 8144-8185 OT Time Calculation (min): 17 min Charges:  OT General Charges $OT Visit: 1 Visit OT Evaluation $OT Eval Low Complexity: [place], MSOT, OTR/L  Supplemental Rehabilitation Services  [phone]  Marius Ditch 11/10/2018, 10:35 AM

## 2018-11-10 NOTE — Procedures (Signed)
  Procedure: Lumber L2-3 disc aspiration 7ml blood tinged fluid EBL:   minimal Complications:  none immediate  See full dictation in [facility].  Dillard Cannon MD Main # [phone] Pager  [phone]

## 2018-11-10 NOTE — Consult Note (Signed)
Date of Admission:  11/09/2018          Reason for Consult: L2-3 acute discitis osteomyelitis, L4 osteomyelitis and 3. Small right psoas muscle abscess.     Referring Provider: Dr. Wyline Copas   Assessment:  1. Lumbar diskitis which was actually suspected by me when I saw him at last admission but which was not evident on imaging 2. Ampicillin sensitive enterococcal bacteremia on therapy with dual beta-lactam due to concerns for possible 3. Prosthetic valve endocarditis  Plan:  1. Greatly appreciate interventional radiology aspirating the disc space 2. Follow-up cultures from IR 3. Continue dual beta-lactam therapy as originally planned to treat for endocarditis 4. Repeat 2D echocardiogram 5. Discontinue PICC line as it was placed prior to proven clearance of cultures and in a patient who had repeatedly positive blood cultures  Principal Problem:   Osteomyelitis ([place]) Active Problems:   Chronic anemia   Pulmonary nodule   Bacteremia due to Enterococcus   Discitis   Scheduled Meds: . amLODipine  5 mg Oral Daily  . aspirin EC  81 mg Oral Daily  . atorvastatin  40 mg Oral Daily  . cholecalciferol  2,000 Units Oral Daily  . folic acid  034 mcg Oral Daily  . lidocaine      . metoprolol succinate  50 mg Oral Daily  . multivitamin with minerals  1 tablet Oral Daily  . timolol  1 drop Right Eye BID  . vitamin C  500 mg Oral Daily   Continuous Infusions: . ampicillin (OMNIPEN) IV 2 g (11/10/18 0839)  . cefTRIAXone (ROCEPHIN)  IV 2 g (11/10/18 0837)   PRN Meds:.acetaminophen **OR** acetaminophen, HYDROcodone-acetaminophen, morphine injection  HPI: Randy Chen is a 85 y.o. male history of a prosthetic aortic valve who was admitted with ampicillin sensitive enterococcal bacteremia and severe low back pain.   He had a transthoracic echocardiogram while inpatient which did not show evidence of endocarditis.  We did not pursue a transesophageal echocardiogram but instead  pursued empiric treatment for bacterial endocarditis.  Note when he initially presented back in April he did have severe back pain that was being blamed on a urinary tract infection.  At the time I was highly skeptical that the amount of pain he had was due to urinary tract infection and we imaged his back with an MRI of his thoracic and lumbosacral area.  We found no pathology on imaging.  That being said I still had a high suspicion that he might have infection at a microscopic level that we could not seen with imaging.  Because of this and because we never excluded endocarditis of his prosthetic valve we had opted for 6-week course of dual beta-lactam therapy with ampicillin and ceftriaxone.  When I had seen the patient in clinic and gone back over his microbiology I was disconcerted to find that he had had repeatedly positive blood cultures with enterococcus prior to placement of his PICC line.  I saw him in clinic in follow-up and we had planned on completing a course of therapy.  However in the interim helped severe spasms of lower back pain precipitated when he had to go up to the bathroom at night.  He also was having pubic pain as well.  He was evaluated at [facility] and had a CT scan that did not show any evidence of intra-abdominal pathology but showed evidence of discitis and vertebral osteomyelitis.  He has been admitted to [facility] and  undergone MRI which now shows   1. L2-3 acute discitis osteomyelitis. Erosive changes of opposing endplates and mild anterior loss of height of L2 vertebral body. 2. Anterior epidural inflammation from L2-L4 with soft tissue thickening, no discrete abscess at this time. Enhancement within dorsal L4 vertebral body likely represents developing osteomyelitis from epidural spread. 3. Small right psoas muscle abscess. Extensive inflammation of bilateral psoas muscles and lumbar paraspinal muscles likely representing a combination of  myositis and muscle strain. 4. Mild L2-3 and moderate L3-4 spinal canal stenosis due to thickening of the anterior epidural soft tissues combined with lumbar spondylosis. 5. Stable background of lumbar spondylosis greatest at the L4-5 and L5-S1 levels  Radiology have aspirated the disc space and sent for culture.  He is continued on dual beta-lactam therapy  He was originally told to complete his therapy for empiric treatment for prosthetic valve endocarditis today.  However I would like to get a repeat 2D echocardiogram but for discontinuing dual therapy.  I expect that the culprit organism is still an enterococcus that is sensitive to ampicillin.  I would likely want to give him another 8-week course of high-dose ampicillin as a targeted therapy for his discitis and vertebral osteomyelitis.  Note this type of infection is very difficult to treat with antimicrobials even when they are correct bactericidal ones such as the ones he has been receiving.  I doubt neurosurgery would intervene on him unless he had clear-cut neurological signs or symptoms which he does not have at this point in time.  Dr. Megan Salon see the patient tomorrow.    Review of Systems: Review of Systems  Constitutional: Negative for chills, diaphoresis, fever, malaise/fatigue and weight loss.  HENT: Negative for congestion, hearing loss, sore throat and tinnitus.   Eyes: Negative for blurred vision and double vision.  Respiratory: Negative for cough, sputum production, shortness of breath and wheezing.   Cardiovascular: Negative for chest pain, palpitations and leg swelling.  Gastrointestinal: Negative for abdominal pain, blood in stool, constipation, diarrhea, heartburn, melena, nausea and vomiting.  Genitourinary: Negative for dysuria, flank pain and hematuria.  Musculoskeletal: Positive for back pain, falls and myalgias. Negative for joint pain.  Skin: Negative for itching and rash.  Neurological: Negative  for dizziness, sensory change, focal weakness, loss of consciousness, weakness and headaches.  Endo/Heme/Allergies: Does not bruise/bleed easily.  Psychiatric/Behavioral: Negative for depression, memory loss and suicidal ideas. The patient is not nervous/anxious.     Past Medical History:  Diagnosis Date  . Aortic stenosis   . Cancer ([place])   . Coronary artery disease    CABG x5 - 1987  . History of atherosclerotic cardiovascular disease   . History of prostatectomy   . Hyperlipidemia   . Hypertension   . PAD (peripheral artery disease) ([place]) 06/06/2017    Social History   Tobacco Use  . Smoking status: Former Smoker    Last attempt to quit: 11/18/1955    Years since quitting: 63.0  . Smokeless tobacco: Never Used  Substance Use Topics  . Alcohol use: No  . Drug use: No    Family History  Problem Relation Age of Onset  . Heart disease Father   . Heart failure Mother   . Heart attack Brother   . Congestive Heart Failure Sister   . Congestive Heart Failure Sister    No Known Allergies  OBJECTIVE: Blood pressure (!) 150/49, pulse (!) 52, temperature 98.4 F (36.9 C), temperature source Oral, resp. rate 18, height 5\' 7"  (1.702  m), weight 83.5 kg, SpO2 95 %.  Physical Exam Constitutional:      General: He is not in acute distress.    Appearance: He is not diaphoretic.  HENT:     Head: Normocephalic and atraumatic.     Right Ear: External ear normal.     Left Ear: External ear normal.     Nose: Nose normal.     Mouth/Throat:     Pharynx: No oropharyngeal exudate.  Eyes:     General: No scleral icterus.    Conjunctiva/sclera: Conjunctivae normal.     Pupils: Pupils are equal, round, and reactive to light.  Neck:     Musculoskeletal: Normal range of motion and neck supple.  Cardiovascular:     Rate and Rhythm: Normal rate and regular rhythm.     Heart sounds: No friction rub. No gallop.   Pulmonary:     Effort: Pulmonary effort is normal. No respiratory distress.      Breath sounds: Normal breath sounds. No wheezing or rales.  Abdominal:     General: Bowel sounds are normal. There is no distension.     Palpations: Abdomen is soft.     Tenderness: There is no abdominal tenderness. There is no rebound.  Musculoskeletal: Normal range of motion.        General: No tenderness.  Lymphadenopathy:     Cervical: No cervical adenopathy.  Skin:    General: Skin is warm and dry.     Coloration: Skin is not pale.     Findings: No erythema or rash.  Neurological:     General: No focal deficit present.     Mental Status: He is alert and oriented to person, place, and time.     Coordination: Coordination normal.  Psychiatric:        Mood and Affect: Mood normal.        Behavior: Behavior normal.        Judgment: Judgment normal.     Lab Results Lab Results  Component Value Date   WBC 6.5 11/10/2018   HGB 7.7 (L) 11/10/2018   HCT 24.1 (L) 11/10/2018   MCV 87.6 11/10/2018   PLT 245 11/10/2018    Lab Results  Component Value Date   CREATININE 1.16 11/10/2018   BUN 13 11/10/2018   NA 136 11/10/2018   K 3.3 (L) 11/10/2018   CL 103 11/10/2018   CO2 24 11/10/2018    Lab Results  Component Value Date   ALT 18 11/09/2018   AST 25 11/09/2018   ALKPHOS 94 11/09/2018   BILITOT 0.4 11/09/2018     Microbiology: Recent Results (from the past 240 hour(s))  Blood Culture (routine x 2)     Status: None (Preliminary result)   Collection Time: 11/09/18 12:30 PM  Result Value Ref Range Status   Specimen Description   Final    BLOOD RIGHT ARM Performed at [facility], [address] [place], [place] [address]    Special Requests   Final    BOTTLES DRAWN AEROBIC AND ANAEROBIC Blood Culture adequate volume Performed at [facility], [address] [place], [place] [address]    Culture   Final    NO GROWTH < 24 HOURS Performed at [facility], [place] [address], [place], [place] [address]    Report Status PENDING   Incomplete  Blood Culture (routine x 2)     Status: None (Preliminary result)   Collection Time: 11/09/18  12:45 PM  Result Value Ref Range Status   Specimen Description   Final    BLOOD LEFT ARM Performed at [facility], [address] [place], [place] [address]    Special Requests   Final    BOTTLES DRAWN AEROBIC AND ANAEROBIC Blood Culture adequate volume Performed at [facility], [address] [place], [place] [address]    Culture   Final    NO GROWTH < 24 HOURS Performed at [facility], [place] [address], [place], [place] [address]    Report Status PENDING  Incomplete  SARS Coronavirus 2 (Hosp order,Performed in [facility] lab via [employer])     Status: None   Collection Time: 11/09/18 12:49 PM  Result Value Ref Range Status   SARS Coronavirus 2 ([employer] Now) NEGATIVE NEGATIVE Final    Comment: (NOTE) Interpretive Result Comment(s): COVID 19 Positive SARS CoV 2 target nucleic acids are DETECTED. The SARS CoV 2 RNA is generally detectable in upper and lower respiratory specimens during the acute phase of infection.  Positive results are indicative of active infection with SARS CoV 2.  Clinical correlation with patient history and other diagnostic information is necessary to determine patient infection status.  Positive results do not rule out bacterial infection or coinfection with other viruses. The expected result is Negative. COVID 19 Negative SARS CoV 2 target nucleic acids are NOT DETECTED. The SARS CoV 2 RNA is generally detectable in upper and lower respiratory specimens during the acute phase of infection.  Negative results do not preclude SARS CoV 2 infection, do not rule out coinfections with other pathogens, and should not be used as the sole basis for treatment or other patient management decisions.  Negative results must be combined with clinical  observations, patient history, and epidemiological information. The expected  result is Negative. Invalid Presence or absence of SARS CoV 2 nucleic acids cannot be determined. Repeat testing was performed on the submitted specimen and repeated Invalid results were obtained.  If clinically indicated, additional testing on a new specimen with an alternate test methodology [phone]) is advised.  The SARS CoV 2 RNA is generally detectable in upper and lower respiratory specimens during the acute phase of infection. The expected result is Negative. Fact Sheet for Patients:  GolfingFamily.no Fact Sheet for Healthcare Providers: https://www.hernandez-brewer.com/ This test is not yet approved or cleared by the [place] FDA and has been authorized for detection and/or diagnosis of SARS CoV 2 by FDA under an Emergency Use Authorization (EUA).  This EUA will remain in effect (meaning this test can be used) for the duration of the COVID19 d eclaration under Section 564(b)(1) of the Act, 21 U.S.C. section [phone] 3(b)(1), unless the authorization is terminated or revoked sooner. Performed at [facility], [address] [place], [place] [address]   Surgical PCR screen     Status: None   Collection Time: 11/10/18  5:58 AM  Result Value Ref Range Status   MRSA, PCR NEGATIVE NEGATIVE Final   Staphylococcus aureus NEGATIVE NEGATIVE Final    Comment: (NOTE) The Xpert SA Assay (FDA approved for NASAL specimens in patients 56 years of age and older), is one component of a comprehensive surveillance program. It is not intended to diagnose infection nor to guide or monitor treatment. Performed at [facility], [place] [address], [place], [place] [address]   Aerobic/Anaerobic Culture (surgical/deep wound)     Status: None (Preliminary  result)   Collection Time: 11/10/18 12:40 PM  Result Value Ref Range Status   Specimen Description NEEDLE ASPIRATE DISC  Final   Special Requests NONE  Final   Gram Stain   Final    RARE  WBC PRESENT, PREDOMINANTLY PMN NO ORGANISMS SEEN Performed at [facility], [place] [address], [place], [place] [address]    Culture PENDING  Incomplete   Report Status PENDING  Incomplete    Alcide Evener, [facility] [phone] pager  11/10/2018, 3:47 PM

## 2018-11-10 NOTE — Progress Notes (Signed)
Pt to MRI

## 2018-11-10 NOTE — Evaluation (Signed)
Physical Therapy Evaluation Patient Details Name: Randy Chen MRN: [id number] DOB: [birth year] Today's Date: 11/10/2018   History of Present Illness  Patient is a 85 y/o male who presents with fever and abdominal pain- has hx of enterococcal bacteremia with concern for discitis and/or possible prosthetic valve endocarditis, currently on antibiotics through a PICC line at home. Chest abdomen-new erosion and sclerosis of the L2-L3 disc space and adjacent endplates with mild adjacent perivertebral fat stranding, consistent with discitis osteomyelitis. PMH includes aortic stenosis s/p CABG, prostate ca, HTN.  Clinical Impression  Patient presents with pain in back and BLEs (anterior thigh) worsened with movement impacting mobility. Pt from [place] with wife and has been getting HHPT since May 5th when he discharged home from SNF. Reports independent for ADLs and uses RW as needed for pain recently. Today, tolerated bed mobility, transfers and taking a few steps to get to chair with min guard assist for balance/safety. Distance limited due to pain. RN notified. Will follow acutely to maximize independence and mobility prior to return home.     Follow Up Recommendations Home health PT;Supervision for mobility/OOB;Supervision/Assistance - 24 hour    Equipment Recommendations  None recommended by PT    Recommendations for Other Services       Precautions / Restrictions Precautions Precautions: Fall Restrictions Weight Bearing Restrictions: No      Mobility  Bed Mobility Overal bed mobility: Needs Assistance Bed Mobility: Rolling;Sidelying to Sit Rolling: Supervision Sidelying to sit: Supervision;HOB elevated       General bed mobility comments: Cues for log roll technique and use of rail. Increased effort.   Transfers Overall transfer level: Needs assistance Equipment used: Rolling walker (2 wheeled) Transfers: Sit to/from Stand Sit to Stand: Min guard;From elevated  surface         General transfer comment: Min guard for safety. Transferred to chair post ambulation.  Ambulation/Gait Ambulation/Gait assistance: Min guard Gait Distance (Feet): 6 Feet Assistive device: Rolling walker (2 wheeled) Gait Pattern/deviations: Step-through pattern;Trunk flexed;Decreased stride length;Decreased step length - right;Decreased step length - left Gait velocity: decreased   General Gait Details: Slow, guarded gait due to pain in BLEs- shooting through bil thighs.  Stairs            Wheelchair Mobility    Modified Rankin (Stroke Patients Only)       Balance Overall balance assessment: Mild deficits observed, not formally tested                                           Pertinent Vitals/Pain Pain Assessment: 0-10 Pain Score: 6  Pain Location: back Pain Descriptors / Indicators: Sore;Aching;Shooting Pain Intervention(s): Monitored during session;Repositioned;Patient requesting pain meds-RN notified;Limited activity within patient's tolerance    Home Living Family/patient expects to be discharged to:: Other (Comment)([facility])                      Prior Function Level of Independence: Independent with assistive device(s)         Comments: lives in [place] with his wife, I with mobility and ADLs. Has been using RW briefly due to pain. Is getting HHPT.     Hand Dominance   Dominant Hand: Right    Extremity/Trunk Assessment   Upper Extremity Assessment Upper Extremity Assessment: Defer to OT evaluation    Lower Extremity Assessment Lower Extremity Assessment: Generalized  weakness(Testing limited by shooting pains in anterior thighs and low back; sensation WFL)    Cervical / Trunk Assessment Cervical / Trunk Assessment: Kyphotic  Communication   Communication: No difficulties  Cognition Arousal/Alertness: Awake/alert Behavior During Therapy: WFL for tasks assessed/performed Overall Cognitive  Status: Within Functional Limits for tasks assessed                                        General Comments      Exercises     Assessment/Plan    PT Assessment Patient needs continued PT services  PT Problem List Decreased strength;Decreased balance;Pain;Decreased mobility;Decreased activity tolerance       PT Treatment Interventions Functional mobility training;Balance training;Patient/family education;Gait training;Therapeutic activities;Therapeutic exercise    PT Goals (Current goals can be found in the Care Plan section)  Acute Rehab PT Goals Patient Stated Goal: to get this infection and pain to go away PT Goal Formulation: With patient Time For Goal Achievement: 11/24/18 Potential to Achieve Goals: Good    Frequency Min 3X/week   Barriers to discharge        Co-evaluation               AM-PAC PT "6 Clicks" Mobility  Outcome Measure Help needed turning from your back to your side while in a flat bed without using bedrails?: A Little Help needed moving from lying on your back to sitting on the side of a flat bed without using bedrails?: A Little Help needed moving to and from a bed to a chair (including a wheelchair)?: A Little Help needed standing up from a chair using your arms (e.g., wheelchair or bedside chair)?: A Little Help needed to walk in hospital room?: A Little Help needed climbing 3-5 steps with a railing? : A Little 6 Click Score: 18    End of Session Equipment Utilized During Treatment: Gait belt Activity Tolerance: Patient limited by pain Patient left: in chair;with call bell/phone within reach;with chair alarm set Nurse Communication: Mobility status PT Visit Diagnosis: Pain Pain - Right/Left: (bil) Pain - part of body: Leg    Time: 6962-9528 PT Time Calculation (min) (ACUTE ONLY): 20 min   Charges:   PT Evaluation $PT Eval Low Complexity: 1 Low          Wray Kearns, PT, DPT Acute Rehabilitation  Services Pager [phone] Office [phone]      Marguarite Arbour A Sabra Heck 11/10/2018, 8:41 AM

## 2018-11-10 NOTE — Progress Notes (Signed)
Pt transfer to IR

## 2018-11-10 NOTE — Progress Notes (Signed)
Pt back from IR, lower back dressing dry and intact, will check vital signs q 15 min x 4 and 30 min x 2, pt alert and oriented no s/s of pain noted.

## 2018-11-10 NOTE — Consult Note (Signed)
Chief Complaint: Patient was seen in consultation today for L2-L3 osteomyelitis/bone biopsy.  Referring Physician(s): Donne Hazel  Supervising Physician: Corrie Mckusick  Patient Status: [facility] - In-pt  History of Present Illness: Randy Chen is a 85 y.o. male with a past medical history of hypertension, hyperlipidemia, HF, CAD s/p CABG x5 1987, PAD, aortic stenosis, collagen vascular disease, asthma, renal insufficiency, prostate cancer s/p prostatectomy, multiple myeloma, diabetes mellitus, and sickle cell anemia. He presented to [facility] ED 11/09/2018 with complaint of abdominal pain. In ED, CT abdomen/pelvis revealed L2-L3 discitis osteomyelitis. Patient was transferred to [facility] for admission and further management. ID was consulted who recommended MR lumbar spine and IR consultation for possible biopsy.  CT abdomen/pelvis 11/09/2018: 1. There is new erosion and sclerosis of the L2-L3 disc space and adjacent endplates with mild adjacent perivertebral fat stranding (series 6, image 61, series 7, image 23), consistent with discitis osteomyelitis. Consider contrast enhanced MRI to more sensitively evaluate for disc space and epidural abscess. 2. Redemonstrated lobulated 1.1 cm nodule of the right lower lobe, unchanged from recent prior examination and very slightly enlarged compared to remote prior examination dated 06/24/2012. As on prior examination, if clinically appropriate consider follow-up CT in 3-6 months to ensure stability. 3. Other chronic, incidental, and postoperative findings as detailed above.  MR lumbar spine 11/10/2018: 1. L2-3 acute discitis osteomyelitis. Erosive changes of opposing endplates and mild anterior loss of height of L2 vertebral body. 2. Anterior epidural inflammation from L2-L4 with soft tissue thickening, no discrete abscess at this time. Enhancement within dorsal L4 vertebral body likely represents developing osteomyelitis from epidural  spread. 3. Small right psoas muscle abscess. Extensive inflammation of bilateral psoas muscles and lumbar paraspinal muscles likely representing a combination of myositis and muscle strain. 4. Mild L2-3 and moderate L3-4 spinal canal stenosis due to thickening of the anterior epidural soft tissues combined with lumbar spondylosis. 5. Stable background of lumbar spondylosis greatest at the L4-5 and L5-S1 levels.  IR consulted by Dr. Wyline Copas for possible image-guided L2-L3 bone biopsy. Patient awake and alert sitting in chair watching TV. Complains of low back pain, rated 7/10 at this time. Denies fever, chills, chest pain, dyspnea, abdominal pain, or headache.   Past Medical History:  Diagnosis Date   Aortic stenosis    Cancer ([place])    Coronary artery disease    CABG x5 - 1987   History of atherosclerotic cardiovascular disease    History of prostatectomy    Hyperlipidemia    Hypertension    PAD (peripheral artery disease) ([place]) 06/06/2017    Past Surgical History:  Procedure Laterality Date   CARDIAC CATHETERIZATION     EF of 66%   CARDIAC VALVE REPLACEMENT  2006   CORONARY ARTERY BYPASS GRAFT  1987   x5   I&D EXTREMITY Left 01/10/2015   Procedure: DEBRIDEMENT ANKLE PLACEMENT ANTIBIOTIC BEADS AND WOUND VAC;  Surgeon: [name], MD;  Location: [place];  Service: Orthopedics;  Laterality: Left;    Allergies: Patient has no known allergies.  Medications: Prior to Admission medications   Medication Sig Start Date End Date Taking? Authorizing Provider  amLODipine (NORVASC) 5 MG tablet TAKE 1 TABLET BY MOUTH DAILY Patient taking differently: Take 5 mg by mouth daily.  07/29/18  Yes Skeet Latch, MD  ampicillin IVPB Inject 2 g into the vein every 8 (eight) hours. Indication:  Enterococcal bacteremia Last Day of Therapy:  Nov 13, 2018 Labs - Once weekly:  CBC/D  and BMP, Labs - Every other week:  ESR and CRP 10/02/18 11/15/18 Yes Swayze, Ava, DO  aspirin EC 81 MG  tablet Take 81 mg by mouth daily.   Yes [provider]  atorvastatin (LIPITOR) 40 MG tablet Take 40 mg by mouth daily.   Yes [provider]  Calcium Carb-Cholecalciferol (CALCIUM 1000 + D PO) Take 1,000 mg by mouth daily.   Yes [provider]  cefTRIAXone (ROCEPHIN) IVPB Inject 2 g into the vein every 12 (twelve) hours. Indication:  Enterococcal bacteremia  Last Day of Therapy:  Nov 13, 2018 Labs - Once weekly:  CBC/D and BMP, Labs - Every other week:  ESR and CRP 10/02/18 11/15/18 Yes Swayze, Ava, DO  Cholecalciferol (VITAMIN D) 2000 UNITS CAPS Take 2,000 Units by mouth daily.   Yes [provider]  cyclobenzaprine (FLEXERIL) 5 MG tablet Take 1 tablet (5 mg total) by mouth 2 (two) times daily. 10/02/18  Yes Swayze, Ava, DO  folic acid (FOLVITE) 768 MCG tablet Take 400 mcg by mouth daily.    Yes [provider]  HYDROcodone-acetaminophen (NORCO/VICODIN) 5-325 MG tablet Take 1-2 tablets by mouth every 4 (four) hours as needed for moderate pain. 10/02/18  Yes Swayze, Ava, DO  metoprolol succinate (TOPROL-XL) 50 MG 24 hr tablet TAKE 1 TABLET BY MOUTH DAILY. DUE FOR FOLLOW UP APPOINTMENT. KEEP OFFICE VISIT Patient taking differently: Take 50 mg by mouth daily.  02/21/18  Yes Skeet Latch, MD  Multiple Vitamin (MULTIVITAMIN WITH MINERALS) TABS Take 1 tablet by mouth daily. Theragran   Yes [provider]  timolol (TIMOPTIC) 0.5 % ophthalmic solution Place 1 drop into the right eye 2 (two) times daily.  11/07/17  Yes [provider]  vitamin C (ASCORBIC ACID) 500 MG tablet Take 500 mg by mouth daily.   Yes [provider]  nitroGLYCERIN (NITROSTAT) 0.4 MG SL tablet Place 1 tablet (0.4 mg total) under the tongue every 5 (five) minutes as needed for chest pain. 06/28/16 09/27/18  Skeet Latch, MD     Family History  Problem Relation Age of Onset   Heart disease Father    Heart failure Mother    Heart attack Brother     Congestive Heart Failure Sister    Congestive Heart Failure Sister     Social History   Socioeconomic History   Marital status: Married    Spouse name: Not on file   Number of children: Not on file   Years of education: Not on file   Highest education level: Not on file  Occupational History   Not on file  Social Needs   Financial resource strain: Not on file   Food insecurity:    Worry: Not on file    Inability: Not on file   Transportation needs:    Medical: Not on file    Non-medical: Not on file  Tobacco Use   Smoking status: Former Smoker    Last attempt to quit: 11/18/1955    Years since quitting: 63.0   Smokeless tobacco: Never Used  Substance and Sexual Activity   Alcohol use: No   Drug use: No   Sexual activity: Not on file  Lifestyle   Physical activity:    Days per week: Not on file    Minutes per session: Not on file   Stress: Not on file  Relationships   Social connections:    Talks on phone: Not on file    Gets together: Not on file  Attends religious service: Not on file    Active member of club or organization: Not on file    Attends meetings of clubs or organizations: Not on file    Relationship status: Not on file  Other Topics Concern   Not on file  Social History Narrative   Not on file     Review of Systems: A 12 point ROS discussed and pertinent positives are indicated in the HPI above.  All other systems are negative.  Review of Systems  Constitutional: Negative for chills and fever.  Respiratory: Negative for shortness of breath and wheezing.   Cardiovascular: Negative for chest pain and palpitations.  Gastrointestinal: Negative for abdominal pain.  Musculoskeletal: Positive for back pain.  Neurological: Negative for headaches.  Psychiatric/Behavioral: Negative for behavioral problems and confusion.    Vital Signs: BP (!) 177/90    Pulse 81    Temp 98.5 F (36.9 C) (Oral)    Resp (!) 22    Ht '5\' 7"'  (1.702 m)     Wt 183 lb 15.9 oz (83.5 kg)    SpO2 96%    BMI 28.82 kg/m   Physical Exam Vitals signs and nursing note reviewed.  Constitutional:      General: He is not in acute distress.    Appearance: Normal appearance.  Cardiovascular:     Rate and Rhythm: Normal rate and regular rhythm.     Heart sounds: Normal heart sounds. No murmur.  Pulmonary:     Effort: Pulmonary effort is normal. No respiratory distress.     Breath sounds: Normal breath sounds. No wheezing.  Musculoskeletal:     Comments: Mild-moderate tenderness of midline lumbar spine region.  Skin:    General: Skin is warm and dry.  Neurological:     Mental Status: He is alert and oriented to person, place, and time.  Psychiatric:        Mood and Affect: Mood normal.        Behavior: Behavior normal.        Thought Content: Thought content normal.        Judgment: Judgment normal.      MD Evaluation Airway: WNL Heart: WNL Abdomen: WNL Chest/ Lungs: WNL ASA  Classification: 3 Mallampati/Airway Score: One   Imaging: Mr Lumbar Spine W Wo Contrast  Result Date: 11/10/2018 CLINICAL DATA:  85 y/o M; history of Enterococcus bacteremia and concern for discitis and/or possible prosthetic valve endocarditis. Severe pain of the lower abdomen and lower back. EXAM: MRI LUMBAR SPINE WITHOUT AND WITH CONTRAST TECHNIQUE: Multiplanar and multiecho pulse sequences of the lumbar spine were obtained without and with intravenous contrast. CONTRAST:  6 cc Gadavist COMPARISON:  11/09/2018 CT the abdomen and pelvis. 09/26/2018 lumbar spine MRI. FINDINGS: Segmentation:  Standard. Alignment:  Physiologic. Vertebrae: L2-3 diffuse vertebral body enhancement, rim enhancing collection within the intervertebral disc space, and opposing endplate erosive changes compatible with acute discitis osteomyelitis. Mild anterior loss of the L2 vertebral body height. The disc abscess extends into the right psoas muscle with there is an 11 mm collection (series 11,  image 16). There is anterior epidural thickening and enhancement extending from L2-L4. There is decreased T1 signal and enhancement within the dorsal aspect of the L4 vertebral body likely representing developing osteomyelitis from contiguous spread. Conus medullaris and cauda equina: Conus extends to the L1 level. Conus and cauda equina appear normal. Paraspinal and other soft tissues: Extensive paravertebral edema at the level of discitis osteomyelitis. Diffuse enhancement of the right  greater than left psoas muscles as well as the left-greater-than-right paraspinal muscles compatible with myositis. Disc levels: Stable multilevel disc and facet degenerative changes greatest at the L4-5 level with there is moderate multifactorial spinal canal stenosis and at the L5-S1 level with there is moderate bilateral neural foraminal stenosis. Increases spinal canal stenosis at the L2-3 and L3-4 levels. Spinal canal stenosis is mild L2-3 and moderate L3-4. IMPRESSION: 1. L2-3 acute discitis osteomyelitis. Erosive changes of opposing endplates and mild anterior loss of height of L2 vertebral body. 2. Anterior epidural inflammation from L2-L4 with soft tissue thickening, no discrete abscess at this time. Enhancement within dorsal L4 vertebral body likely represents developing osteomyelitis from epidural spread. 3. Small right psoas muscle abscess. Extensive inflammation of bilateral psoas muscles and lumbar paraspinal muscles likely representing a combination of myositis and muscle strain. 4. Mild L2-3 and moderate L3-4 spinal canal stenosis due to thickening of the anterior epidural soft tissues combined with lumbar spondylosis. 5. Stable background of lumbar spondylosis greatest at the L4-5 and L5-S1 levels. Electronically Signed   By: Kristine Garbe M.D.   On: 11/10/2018 01:30   Ct Abdomen Pelvis W Contrast  Result Date: 11/09/2018 CLINICAL DATA:  Pelvic pain, low back pain EXAM: CT ABDOMEN AND PELVIS WITH  CONTRAST TECHNIQUE: Multidetector CT imaging of the abdomen and pelvis was performed using the standard protocol following bolus administration of intravenous contrast. CONTRAST:  79m OMNIPAQUE IOHEXOL 300 MG/ML  SOLN COMPARISON:  09/25/2018, 06/24/2012 FINDINGS: Lower chest: No acute abnormality. Redemonstrated lobulated 1.1 cm nodule of the right lower lobe, unchanged from recent prior examination and very slightly enlarged compared to remote prior examination dated 06/24/2012. Extensive 3 vessel coronary artery calcifications and/or stents, status post aortic valve replacement. Mild bibasilar fibrotic change. Hepatobiliary: No focal liver abnormality is seen. The gallbladder is distended with sludge. No gallbladder wall thickening, or biliary dilatation. Pancreas: Unremarkable. No pancreatic ductal dilatation or surrounding inflammatory changes. Spleen: Normal in size without focal abnormality. Adrenals/Urinary Tract: Adrenal glands are unremarkable. Kidneys are normal, without renal calculi, focal lesion, or hydronephrosis. Bladder is unremarkable. Stomach/Bowel: Stomach is within normal limits. Appendix appears normal. No evidence of bowel wall thickening, distention, or inflammatory changes. Moderate burden of stool in the left and right colon. Sigmoid diverticulosis. Vascular/Lymphatic: Severe calcific atherosclerosis. No enlarged abdominal or pelvic lymph nodes. Reproductive: Status post prostatectomy. Other: No abdominal wall hernia or abnormality. No abdominopelvic ascites. Musculoskeletal: There is new erosion and sclerosis of the L2-L3 disc space and adjacent endplates with mild adjacent perivertebral fat stranding (series 6, image 61, series 7, image 23). IMPRESSION: 1. There is new erosion and sclerosis of the L2-L3 disc space and adjacent endplates with mild adjacent perivertebral fat stranding (series 6, image 61, series 7, image 23), consistent with discitis osteomyelitis. Consider contrast  enhanced MRI to more sensitively evaluate for disc space and epidural abscess. 2. Redemonstrated lobulated 1.1 cm nodule of the right lower lobe, unchanged from recent prior examination and very slightly enlarged compared to remote prior examination dated 06/24/2012. As on prior examination, if clinically appropriate consider follow-up CT in 3-6 months to ensure stability. 3. Other chronic, incidental, and postoperative findings as detailed above. Electronically Signed   By: AEddie CandleM.D.   On: 11/09/2018 14:48   Dg Chest Port 1 View  Result Date: 11/09/2018 CLINICAL DATA:  Abdominal pain with concern for sepsis EXAM: PORTABLE CHEST 1 VIEW COMPARISON:  September 12, 2004 FINDINGS: There is a small calcified granuloma in the left  base. There is no edema or consolidation. Heart size and pulmonary vascularity are normal. Patient is status post coronary artery bypass grafting. There is aortic atherosclerosis. No adenopathy. There old healed rib fractures on the right. IMPRESSION: Small calcified granuloma left lower lobe. No edema or consolidation. Heart size within normal limits. Postoperative changes noted. Aortic Atherosclerosis (ICD10-I70.0). Electronically Signed   By: Lowella Grip III M.D.   On: 11/09/2018 12:44    Labs:  CBC: Recent Labs    09/29/18 0549 09/30/18 0513 11/09/18 1233 11/10/18 0341  WBC 12.5* 10.9* 7.9 6.5  HGB 10.1* 9.5* 8.6* 7.7*  HCT 30.0* 27.7* 27.5* 24.1*  PLT 163 193 299 245    COAGS: Recent Labs    11/10/18 0940  INR 1.1    BMP: Recent Labs    09/29/18 0549 10/02/18 0455 11/09/18 1233 11/10/18 0341  NA 135 134* 134* 136  K 3.9 3.6 3.6 3.3*  CL 103 100 99 103  CO2 21* '24 24 24  ' GLUCOSE 133* 111* 122* 89  BUN 30* 24* 18 13  CALCIUM 8.2* 8.1* 8.3* 8.0*  CREATININE 1.87* 1.73* 1.32* 1.16  GFRNONAA 31* 34* 48* 56*  GFRAA 36* 40* 55* >60    LIVER FUNCTION TESTS: Recent Labs    09/25/18 1359 09/26/18 0535 11/09/18 1233  BILITOT 1.0 0.7 0.4   AST 61* 45* 25  ALT 42 34 18  ALKPHOS 53 47 94  PROT 6.4* 5.3* 6.3*  ALBUMIN 3.5 3.0* 3.0*     Assessment and Plan:  L2-L3 osteomyelitis. Plan for image-guided L2-L3 bone biopsy today with Dr. Earleen Newport. Patient is NPO. Afebrile and WBCs WNL. He does not take blood thinners. INR 1.1 today.  Risks and benefits discussed with the patient including, but not limited to bleeding, infection, damage to adjacent structures or low yield requiring additional tests. All of the patient's questions were answered, patient is agreeable to proceed. Consent signed and in chart.   Thank you for this interesting consult.  I greatly enjoyed meeting Randy Chen and look forward to participating in their care.  A copy of this report was sent to the requesting provider on this date.  Electronically Signed: Earley Abide, PA-C 11/10/2018, 10:13 AM   I spent a total of 40 Minutes in face to face in clinical consultation, greater than 50% of which was counseling/coordinating care for L2-L3 osteomyelitis/bone biopsy.

## 2018-11-10 NOTE — Progress Notes (Signed)
Received pt alert and oriented. Ambulated to bed with 1 person assist. Pt complains of 5/10 lower back pain. Oriented pt to room and use of call light.

## 2018-11-10 NOTE — Progress Notes (Signed)
Pt back to floor.

## 2018-11-11 DIAGNOSIS — Z8619 Personal history of other infectious and parasitic diseases: Secondary | ICD-10-CM

## 2018-11-11 DIAGNOSIS — R011 Cardiac murmur, unspecified: Secondary | ICD-10-CM

## 2018-11-11 LAB — CBC
HCT: 25.9 % — ABNORMAL LOW (ref 39.0–52.0)
Hemoglobin: 8.3 g/dL — ABNORMAL LOW (ref 13.0–17.0)
MCH: 28.1 pg (ref 26.0–34.0)
MCHC: 32 g/dL (ref 30.0–36.0)
MCV: 87.8 fL (ref 80.0–100.0)
Platelets: 238 10*3/uL (ref 150–400)
RBC: 2.95 MIL/uL — ABNORMAL LOW (ref 4.22–5.81)
RDW: 13.9 % (ref 11.5–15.5)
WBC: 6.9 10*3/uL (ref 4.0–10.5)
nRBC: 0 % (ref 0.0–0.2)

## 2018-11-11 LAB — URINE CULTURE: Culture: NO GROWTH

## 2018-11-11 LAB — COMPREHENSIVE METABOLIC PANEL
ALT: 17 U/L (ref 0–44)
AST: 27 U/L (ref 15–41)
Albumin: 2.3 g/dL — ABNORMAL LOW (ref 3.5–5.0)
Alkaline Phosphatase: 98 U/L (ref 38–126)
Anion gap: 9 (ref 5–15)
BUN: 14 mg/dL (ref 8–23)
CO2: 26 mmol/L (ref 22–32)
Calcium: 8.3 mg/dL — ABNORMAL LOW (ref 8.9–10.3)
Chloride: 103 mmol/L (ref 98–111)
Creatinine, Ser: 1.11 mg/dL (ref 0.61–1.24)
GFR calc Af Amer: >60 mL/min (ref >60)
GFR calc non Af Amer: 59 mL/min — ABNORMAL LOW (ref >60)
Glucose, Bld: 104 mg/dL — ABNORMAL HIGH (ref 70–99)
Potassium: 3.6 mmol/L (ref 3.5–5.1)
Sodium: 138 mmol/L (ref 135–145)
Total Bilirubin: 0.4 mg/dL (ref 0.3–1.2)
Total Protein: 5.3 g/dL — ABNORMAL LOW (ref 6.5–8.1)

## 2018-11-11 MED ORDER — HYDRALAZINE HCL 20 MG/ML IJ SOLN: 5.0000 mg | INTRAMUSCULAR | Status: DC | PRN

## 2018-11-11 MED ORDER — AMLODIPINE BESYLATE 10 MG PO TABS
10.0000 mg | ORAL_TABLET | Freq: Every day | ORAL | Status: DC
  Administered 2018-11-11 – 2018-11-13 (×3): 10 mg via ORAL
  Filled 2018-11-11 (×3): qty 1

## 2018-11-11 NOTE — Progress Notes (Signed)
PROGRESS NOTE    PERSEUS WESTALL  GHW:[id number] DOB: 11-08-1927 DOA: 11/09/2018 PCP: [name], MD    Brief Narrative:  85 y.o. male with medical history significant of enterococcal bacteremia with concern for discitis and/or possible prosthetic valve endocarditis, currently on antibiotics through a PICC line at home (Rocephin and ampicillin), CAD status post CABG, prostate cancer, prostatectomy, hypertension, hyperlipidemia, aortic stenosis status post bioprosthetic aortic valve replacement, PAD, and conditions listed below presented to [place] ED for evaluation of lower abdominal pain.  Patient states for the past 3 days he is having intermittent severe pain in his lower abdomen and lower back.  States the pain is worse whenever he tries to move.  Denies any fevers or chills at home.  Denies any nausea, vomiting, diarrhea, or dysuria.  States his last bowel movement was yesterday.  Denies any melena or hematochezia.  Denies saddle anesthesia, lower extremity weakness or numbness, or fecal/urinary incontinence.  States he has been getting antibiotics through his PICC line at home.  Assessment & Plan:   Principal Problem:   Osteomyelitis ([place]) Active Problems:   Chronic anemia   Pulmonary nodule   Bacteremia due to Enterococcus   Discitis  L2-L3 discitis/osteomyelitis -suspect secondary to enterococcal bacteremia -Patient has a recent history of enterococcal bacteremia with concern for discitis and/or possible prosthetic valve endocarditis from an admission from April 8 to April 15 and discharged on a 6-week course of Rocephin and ampicillin via PICC line. -CT abdomen pelvis showing new erosion and sclerosis of the L2-L3 disc space and adjacent endplates with mild adjacent perivertebral fat stranding, consistent with discitis osteomyelitis.  -Admitting physician discussed with ID who recommended MRI of the lumbar spine and interventional radiology consult for spine  biopsy. -Biopsy obtained 5/24, results pending -MRI lumbar spine with contrast personally reviewed, with findings of L2-3 discitis and osteomyelitis -continue with analgesics as patient needs -Blood culture x2 thus far neg -ID recommends continue ampicillin and rocephin pending finalization of cultures  CKD stage III Creatinine 1.3, improved since labs done a month ago.  -cr down to 1.11 -Repeat bmet in AM  Chronic anemia -Hemoglobin 7.7 at present, was in the 9-10 range a month ago.  Patient denies any melena or hematochezia. -Hgb up to 8.3 -Repeat cbc in AM  Pulmonary nodule -CT showing lobulated 1.1 cm nodule of the right lower lobe, unchanged from recent prior examination and very slightly enlarged compared to remote prior examination dated 06/24/2012.  Patient is a former smoker. -Patient will need a follow-up CT in 3 to 6 months to ensure stability.  Hypertension -Continue amlodipine, metoprolol as tolerated -BP presently stable  CAD status post CABG -Continue aspirin, statin, beta-blocker -Chest pain free at this time  Hyperlipidemia -Continue Lipitor -Currently stable  DVT prophylaxis: SCD's Code Status: DNR Family Communication: Pt in room, family not at bedside Disposition Plan: Uncertain at this time  Consultants:   IR  ID  Procedures:   Lumbar spine biopsy 5/24  Antimicrobials: Anti-infectives (From admission, onward)   Start     Dose/Rate Route Frequency Ordered Stop   11/09/18 2100  cefTRIAXone (ROCEPHIN) 2 g in sodium chloride 0.9 % 100 mL IVPB     2 g 200 mL/hr over 30 Minutes Intravenous Every 12 hours 11/09/18 1322     11/09/18 1737  ampicillin (OMNIPEN) 2 g injection    Note to Pharmacy:  Danie Binder : cabinet override      11/09/18 1737 11/10/18 0544  11/09/18 1700  ampicillin (OMNIPEN) 2 g in sodium chloride 0.9 % 100 mL IVPB     2 g 300 mL/hr over 20 Minutes Intravenous Every 8 hours 11/09/18 1322         Subjective: Without complaints this AM  Objective: Vitals:   11/10/18 1501 11/11/18 0813 11/11/18 1038 11/11/18 1352  BP: (!) 150/49 (!) 146/96 140/69 134/65  Pulse: (!) 52 92 80 79  Resp: 18     Temp: 98.4 F (36.9 C)  98.8 F (37.1 C) 99 F (37.2 C)  TempSrc: Oral  Oral Oral  SpO2: 95%  98% 96%  Weight:      Height:        Intake/Output Summary (Last 24 hours) at 11/11/2018 1530 Last data filed at 11/11/2018 0845 Gross per 24 hour  Intake 300 ml  Output 175 ml  Net 125 ml   Filed Weights   11/09/18 1413  Weight: 83.5 kg    Examination: General exam: Awake, laying in bed, in nad Respiratory system: Normal respiratory effort, no wheezing Cardiovascular system: regular rate, s1, s2 Gastrointestinal system: Soft, nondistended, positive BS Central nervous system: CN2-12 grossly intact, strength intact Extremities: Perfused, no clubbing Skin: Normal skin turgor, no notable skin lesions seen Psychiatry: Mood normal // no visual hallucinations   Data Reviewed: I have personally reviewed following labs and imaging studies  CBC: Recent Labs  Lab 11/09/18 1233 11/10/18 0341 11/11/18 0415  WBC 7.9 6.5 6.9  NEUTROABS 5.9  --   --   HGB 8.6* 7.7* 8.3*  HCT 27.5* 24.1* 25.9*  MCV 89.6 87.6 87.8  PLT 299 245 947   Basic Metabolic Panel: Recent Labs  Lab 11/09/18 1233 11/10/18 0341 11/11/18 0415  NA 134* 136 138  K 3.6 3.3* 3.6  CL 99 103 103  CO2 24 24 26   GLUCOSE 122* 89 104*  BUN 18 13 14   CREATININE 1.32* 1.16 1.11  CALCIUM 8.3* 8.0* 8.3*   GFR: Estimated Creatinine Clearance: 47.6 mL/min (by C-G formula based on SCr of 1.11 mg/dL). Liver Function Tests: Recent Labs  Lab 11/09/18 1233 11/11/18 0415  AST 25 27  ALT 18 17  ALKPHOS 94 98  BILITOT 0.4 0.4  PROT 6.3* 5.3*  ALBUMIN 3.0* 2.3*   No results for input(s): LIPASE, AMYLASE in the last 168 hours. No results for input(s): AMMONIA in the last 168 hours. Coagulation Profile: Recent  Labs  Lab 11/10/18 0940  INR 1.1   Cardiac Enzymes: No results for input(s): CKTOTAL, CKMB, CKMBINDEX, TROPONINI in the last 168 hours. BNP (last 3 results) No results for input(s): PROBNP in the last 8760 hours. HbA1C: No results for input(s): HGBA1C in the last 72 hours. CBG: No results for input(s): GLUCAP in the last 168 hours. Lipid Profile: No results for input(s): CHOL, HDL, LDLCALC, TRIG, CHOLHDL, LDLDIRECT in the last 72 hours. Thyroid Function Tests: No results for input(s): TSH, T4TOTAL, FREET4, T3FREE, THYROIDAB in the last 72 hours. Anemia Panel: No results for input(s): VITAMINB12, FOLATE, FERRITIN, TIBC, IRON, RETICCTPCT in the last 72 hours. Sepsis Labs: Recent Labs  Lab 11/09/18 1233  LATICACIDVEN 1.8    Recent Results (from the past 240 hour(s))  Blood Culture (routine x 2)     Status: None (Preliminary result)   Collection Time: 11/09/18 12:30 PM  Result Value Ref Range Status   Specimen Description   Final    BLOOD RIGHT ARM Performed at [facility], [address] [place],  [place] [address]    Special Requests   Final    BOTTLES DRAWN AEROBIC AND ANAEROBIC Blood Culture adequate volume Performed at [facility], [address] [place], [place] [address]    Culture   Final    NO GROWTH 2 DAYS Performed at [facility], [place] [address] [place], [place] [address]    Report Status PENDING  Incomplete  Blood Culture (routine x 2)     Status: None (Preliminary result)   Collection Time: 11/09/18 12:45 PM  Result Value Ref Range Status   Specimen Description   Final    BLOOD LEFT ARM Performed at [facility], [address] [place], [place] [address]    Special Requests   Final    BOTTLES DRAWN AEROBIC AND ANAEROBIC Blood Culture adequate volume Performed at [facility], [address] [place], [place] [address]    Culture   Final    NO GROWTH 2 DAYS Performed at [facility],  [place] [address] [place], [place] [address]    Report Status PENDING  Incomplete  SARS Coronavirus 2 (Hosp order,Performed in [facility] lab via [employer] ID)     Status: None   Collection Time: 11/09/18 12:49 PM  Result Value Ref Range Status   SARS Coronavirus 2 ([employer] Now) NEGATIVE NEGATIVE Final    Comment: (NOTE) Interpretive Result Comment(s): COVID 19 Positive SARS CoV 2 target nucleic acids are DETECTED. The SARS CoV 2 RNA is generally detectable in upper and lower respiratory specimens during the acute phase of infection.  Positive results are indicative of active infection with SARS CoV 2.  Clinical correlation with patient history and other diagnostic information is necessary to determine patient infection status.  Positive results do not rule out bacterial infection or coinfection with other viruses. The expected result is Negative. COVID 19 Negative SARS CoV 2 target nucleic acids are NOT DETECTED. The SARS CoV 2 RNA is generally detectable in upper and lower respiratory specimens during the acute phase of infection.  Negative results do not preclude SARS CoV 2 infection, do not rule out coinfections with other pathogens, and should not be used as the sole basis for treatment or other patient management decisions.  Negative results must be combined with clinical  observations, patient history, and epidemiological information. The expected result is Negative. Invalid Presence or absence of SARS CoV 2 nucleic acids cannot be determined. Repeat testing was performed on the submitted specimen and repeated Invalid results were obtained.  If clinically indicated, additional testing on a new specimen with an alternate test methodology [id number]) is advised.  The SARS CoV 2 RNA is generally detectable in upper and lower respiratory specimens during the acute phase of infection. The expected result is Negative. Fact Sheet for Patients:   GolfingFamily.no Fact Sheet for Healthcare Providers: https://www.hernandez-brewer.com/ This test is not yet approved or cleared by the [place] FDA and has been authorized for detection and/or diagnosis of SARS CoV 2 by FDA under an Emergency Use Authorization (EUA).  This EUA will remain in effect (meaning this test can be used) for the duration of the COVID19 d eclaration under Section 564(b)(1) of the Act, 21 U.S.C. section 848 327 2203 3(b)(1), unless the authorization is terminated or revoked sooner. Performed at [facility], [address], [place], [place] [address]   Urine culture     Status: None   Collection Time: 11/09/18  2:42 PM  Result Value Ref Range Status   Specimen Description   Final    URINE, RANDOM Performed at [facility], [address] [place], [place] [address]    Special Requests   Final    NONE Performed at [facility], [address] [place], [place] [address]    Culture   Final    NO GROWTH Performed at [facility], [place] [address], [place], [place] [address]    Report Status 11/11/2018 FINAL  Final  Surgical PCR screen     Status: None   Collection Time: 11/10/18  5:58 AM  Result Value Ref Range Status   MRSA, PCR NEGATIVE NEGATIVE Final   Staphylococcus aureus NEGATIVE NEGATIVE Final    Comment: (NOTE) The Xpert SA Assay (FDA approved for NASAL specimens in patients 24 years of age and older), is one component of a comprehensive surveillance program. It is not intended to diagnose infection nor to guide or monitor treatment. Performed at [facility], [place] [address], [place], [place] [address]   Aerobic/Anaerobic Culture (surgical/deep wound)     Status: None (Preliminary result)   Collection Time: 11/10/18 12:40 PM  Result Value Ref Range Status   Specimen Description NEEDLE ASPIRATE DISC  Final   Special Requests NONE  Final   Gram Stain   Final    RARE  WBC PRESENT, PREDOMINANTLY PMN NO ORGANISMS SEEN    Culture   Final    NO GROWTH < 24 HOURS Performed at [facility], [place] [address] [place], [place] [address]    Report Status PENDING  Incomplete     Radiology Studies: Mr Lumbar Spine W Wo Contrast  Result Date: 11/10/2018 CLINICAL DATA:  85 y/o M; history of Enterococcus bacteremia and concern for discitis and/or possible prosthetic valve endocarditis. Severe pain of the lower abdomen and lower back. EXAM: MRI LUMBAR SPINE WITHOUT AND WITH CONTRAST TECHNIQUE: Multiplanar and multiecho pulse sequences of the lumbar spine were obtained without and with intravenous contrast. CONTRAST:  6 cc Gadavist COMPARISON:  11/09/2018 CT the abdomen and pelvis. 09/26/2018 lumbar spine MRI. FINDINGS: Segmentation:  Standard. Alignment:  Physiologic. Vertebrae: L2-3 diffuse vertebral body enhancement, rim enhancing collection within the intervertebral disc space, and opposing endplate erosive changes compatible with acute discitis osteomyelitis. Mild anterior loss of the L2 vertebral body height. The disc abscess extends into the right psoas muscle with there is an 11 mm collection (series 11, image 16). There is anterior epidural thickening and enhancement extending from L2-L4. There is decreased T1 signal and enhancement within the dorsal aspect of the L4 vertebral body likely representing developing osteomyelitis from contiguous spread. Conus medullaris and cauda equina: Conus extends to the L1 level. Conus and cauda equina appear normal. Paraspinal and other soft tissues: Extensive paravertebral edema at the level of discitis osteomyelitis. Diffuse enhancement of the right greater than left psoas muscles as well as the left-greater-than-right paraspinal muscles compatible with myositis. Disc levels: Stable multilevel disc and facet degenerative changes greatest at the L4-5 level with there is moderate multifactorial spinal canal stenosis and at the L5-S1  level with there is moderate bilateral neural foraminal stenosis. Increases spinal canal stenosis at the L2-3 and L3-4 levels. Spinal canal stenosis is mild L2-3 and moderate L3-4. IMPRESSION: 1. L2-3 acute discitis osteomyelitis. Erosive changes of opposing endplates and mild anterior loss of height of L2 vertebral body. 2. Anterior epidural inflammation from L2-L4 with soft tissue thickening, no  discrete abscess at this time. Enhancement within dorsal L4 vertebral body likely represents developing osteomyelitis from epidural spread. 3. Small right psoas muscle abscess. Extensive inflammation of bilateral psoas muscles and lumbar paraspinal muscles likely representing a combination of myositis and muscle strain. 4. Mild L2-3 and moderate L3-4 spinal canal stenosis due to thickening of the anterior epidural soft tissues combined with lumbar spondylosis. 5. Stable background of lumbar spondylosis greatest at the L4-5 and L5-S1 levels. Electronically Signed   By: Kristine Garbe M.D.   On: 11/10/2018 01:30   Ir Lumbar Disc Aspiration W/img Guide  Result Date: 11/10/2018 INDICATION: Suspected discitis/osteomyelitis lumbar spine centered at L2-3. EXAM: LUMBAR L2-3 DISC ASPIRATION UNDER FLUOROSCOPY MEDICATIONS: Lidocaine 1% subcutaneous ANESTHESIA/SEDATION: Intravenous Fentanyl 57mcg and Versed 1.5mg  were administered as conscious sedation during continuous monitoring of the patient's level of consciousness and physiological / cardiorespiratory status by the radiology RN, with a total moderate sedation time of 10 minutes. PROCEDURE: Informed written consent was obtained from the patient after a thorough discussion of the procedural risks, benefits and alternatives. All questions were addressed. Maximal Sterile Barrier Technique was utilized including caps, mask, sterile gowns, sterile gloves, sterile drape, hand hygiene and skin antiseptic. A timeout was performed prior to the initiation of the procedure.  The appropriate interspace was identified under fluoroscopy, corresponding to previous cross-sectional imaging. An appropriate skin entry site was determined. After local infiltration with 1% lidocaine, a 17 gauge trocar needle was advanced into the interspace from right posterolateral extraforaminal approach. Needle tip position within the interspace confirmed on biplane images. 2 mL thin blood-tinged fluid were aspirated, sent for the requested laboratory studies. The patient tolerated the procedure well. FLUOROSCOPY TIME:  42 seconds; 42 mGy COMPLICATIONS: None immediate. IMPRESSION: Technically successful lumbar L2-3 disc aspiration under fluoroscopy. Electronically Signed   By: Lucrezia Europe M.D.   On: 11/10/2018 12:59    Scheduled Meds:  amLODipine  10 mg Oral Daily   aspirin EC  81 mg Oral Daily   atorvastatin  40 mg Oral Daily   cholecalciferol  2,000 Units Oral Daily   folic acid  542 mcg Oral Daily   metoprolol succinate  50 mg Oral Daily   multivitamin with minerals  1 tablet Oral Daily   timolol  1 drop Right Eye BID   vitamin C  500 mg Oral Daily   Continuous Infusions:  ampicillin (OMNIPEN) IV 2 g (11/11/18 0835)   cefTRIAXone (ROCEPHIN)  IV 2 g (11/11/18 1037)     LOS: 2 days   Marylu Lund, MD [facility] Pager On Amion  If 7PM-7AM, please contact night-coverage 11/11/2018, 3:30 PM

## 2018-11-11 NOTE — Progress Notes (Signed)
Patient ID: Randy Chen, male   DOB: 1927-10-14, 85 y.o.   MRN: [id number]         [facility]  Date of Admission:  11/09/2018           Day 46 ampicillin        Day 46 ceftriaxone ASSESSMENT: He has lumbar discitis and osteomyelitis complicating his recent enterococcal bacteremia.  He remains afebrile.  Repeat blood cultures are negative.  If they remain negative I will have a new PICC line placed tomorrow in anticipation of continued IV ampicillin and ceftriaxone.  PLAN: 1. Continue current antibiotics pending final cultures  Principal Problem:   Osteomyelitis ([place]) Active Problems:   Chronic anemia   Pulmonary nodule   Bacteremia due to Enterococcus   Discitis   Scheduled Meds: . amLODipine  10 mg Oral Daily  . aspirin EC  81 mg Oral Daily  . atorvastatin  40 mg Oral Daily  . cholecalciferol  2,000 Units Oral Daily  . folic acid  102 mcg Oral Daily  . metoprolol succinate  50 mg Oral Daily  . multivitamin with minerals  1 tablet Oral Daily  . timolol  1 drop Right Eye BID  . vitamin C  500 mg Oral Daily   Continuous Infusions: . ampicillin (OMNIPEN) IV 2 g (11/11/18 0835)  . cefTRIAXone (ROCEPHIN)  IV 2 g (11/11/18 1037)   PRN Meds:.acetaminophen **OR** acetaminophen, hydrALAZINE, HYDROcodone-acetaminophen, morphine injection   SUBJECTIVE: His back pain has improved.  Review of Systems: Review of Systems  Constitutional: Negative for chills, diaphoresis and fever.  Respiratory: Negative for cough.   Cardiovascular: Negative for chest pain.  Gastrointestinal: Negative for abdominal pain, diarrhea, nausea and vomiting.  Genitourinary: Negative for dysuria.  Musculoskeletal: Positive for back pain.    No Known Allergies  OBJECTIVE: Vitals:   11/10/18 1311 11/10/18 1501 11/11/18 0813 11/11/18 1038  BP:  (!) 150/49 (!) 146/96 140/69  Pulse: 89 (!) 52 92 80  Resp:  18    Temp:  98.4 F (36.9 C)  98.8 F (37.1 C)  TempSrc:   Oral  Oral  SpO2: 98% 95%  98%  Weight:      Height:       Body mass index is 28.82 kg/m.  Physical Exam Constitutional:      Comments: He is sitting up in a chair.  He is pleasant and in good spirits.  Cardiovascular:     Rate and Rhythm: Normal rate and regular rhythm.     Heart sounds: Murmur present.     Comments: Early 2/6 systolic murmur heard best at the right upper sternal border. Pulmonary:     Effort: Pulmonary effort is normal.     Breath sounds: Normal breath sounds.  Psychiatric:        Mood and Affect: Mood normal.     Lab Results Lab Results  Component Value Date   WBC 6.9 11/11/2018   HGB 8.3 (L) 11/11/2018   HCT 25.9 (L) 11/11/2018   MCV 87.8 11/11/2018   PLT 238 11/11/2018    Lab Results  Component Value Date   CREATININE 1.11 11/11/2018   BUN 14 11/11/2018   NA 138 11/11/2018   K 3.6 11/11/2018   CL 103 11/11/2018   CO2 26 11/11/2018    Lab Results  Component Value Date   ALT 17 11/11/2018   AST 27 11/11/2018   ALKPHOS 98 11/11/2018   BILITOT 0.4 11/11/2018  Microbiology: Recent Results (from the past 240 hour(s))  Blood Culture (routine x 2)     Status: None (Preliminary result)   Collection Time: 11/09/18 12:30 PM  Result Value Ref Range Status   Specimen Description   Final    BLOOD RIGHT ARM Performed at [facility], [address] [place], [place] [address]    Special Requests   Final    BOTTLES DRAWN AEROBIC AND ANAEROBIC Blood Culture adequate volume Performed at [facility], [address] [place], [place] [address]    Culture   Final    NO GROWTH 2 DAYS Performed at [facility], [place] [address], [place], [place] [address]    Report Status PENDING  Incomplete  Blood Culture (routine x 2)     Status: None (Preliminary result)   Collection Time: 11/09/18 12:45 PM  Result Value Ref Range Status   Specimen Description   Final    BLOOD LEFT ARM Performed at [facility], [address] [place], [place] [address]    Special Requests   Final    BOTTLES DRAWN AEROBIC AND ANAEROBIC Blood Culture adequate volume Performed at [facility], [address] [place], [place] [address]    Culture   Final    NO GROWTH 2 DAYS Performed at [facility], [place] [address], [place], [place] [address]    Report Status PENDING  Incomplete  SARS Coronavirus 2 (Hosp order,Performed in [facility] lab via [employer] ID)     Status: None   Collection Time: 11/09/18 12:49 PM  Result Value Ref Range Status   SARS Coronavirus 2 ([employer] ID Now) NEGATIVE NEGATIVE Final    Comment: (NOTE) Interpretive Result Comment(s): COVID 19 Positive SARS CoV 2 target nucleic acids are DETECTED. The SARS CoV 2 RNA is generally detectable in upper and lower respiratory specimens during the acute phase of infection.  Positive results are indicative of active infection with SARS CoV 2.  Clinical correlation with patient history and other diagnostic information is necessary to determine patient infection status.  Positive results do not rule out bacterial infection or coinfection with other viruses. The expected result is Negative. COVID 19 Negative SARS CoV 2 target nucleic acids are NOT DETECTED. The SARS CoV 2 RNA is generally detectable in upper and lower respiratory specimens during the acute phase of infection.  Negative results do not preclude SARS CoV 2 infection, do not rule out coinfections with other pathogens, and should not be used as the sole basis for treatment or other patient management decisions.  Negative results must be combined with clinical  observations, patient history, and epidemiological information. The expected result is Negative. Invalid Presence or absence of SARS CoV 2 nucleic acids cannot be determined. Repeat testing was performed on the submitted specimen and repeated Invalid results were obtained.  If clinically indicated, additional testing  on a new specimen with an alternate test methodology [phone]) is advised.  The SARS CoV 2 RNA is generally detectable in upper and lower respiratory specimens during the acute phase of infection. The expected result is Negative. Fact Sheet for Patients:  GolfingFamily.no Fact Sheet for Healthcare Providers: https://www.hernandez-brewer.com/ This test is not yet approved or cleared by the [place] FDA and has been authorized for detection and/or diagnosis of SARS CoV 2 by FDA under an Emergency Use Authorization (EUA).  This EUA will remain in effect (meaning this test can be  used) for the duration of the COVID19 d eclaration under Section 564(b)(1) of the Act, 21 U.S.C. section [phone] 3(b)(1), unless the authorization is terminated or revoked sooner. Performed at [facility], [address], [place], [place] [address]   Urine culture     Status: None   Collection Time: 11/09/18  2:42 PM  Result Value Ref Range Status   Specimen Description   Final    URINE, RANDOM Performed at [facility], [address] [place], [place] [address]    Special Requests   Final    NONE Performed at [facility], [address] [place], [place] [address]    Culture   Final    NO GROWTH Performed at [facility], [place] [address], [place], [place] [address]    Report Status 11/11/2018 FINAL  Final  Surgical PCR screen     Status: None   Collection Time: 11/10/18  5:58 AM  Result Value Ref Range Status   MRSA, PCR NEGATIVE NEGATIVE Final   Staphylococcus aureus NEGATIVE NEGATIVE Final    Comment: (NOTE) The Xpert SA Assay (FDA approved for NASAL specimens in patients 82 years of age and older), is one component of a comprehensive surveillance program. It is not intended to diagnose infection nor to guide or monitor treatment. Performed at [facility], [place] [address] [place], [place] [address]    Aerobic/Anaerobic Culture (surgical/deep wound)     Status: None (Preliminary result)   Collection Time: 11/10/18 12:40 PM  Result Value Ref Range Status   Specimen Description NEEDLE ASPIRATE DISC  Final   Special Requests NONE  Final   Gram Stain   Final    RARE WBC PRESENT, PREDOMINANTLY PMN NO ORGANISMS SEEN    Culture   Final    NO GROWTH < 24 HOURS Performed at [facility], [place] [address], [place], [place] [address]    Report Status PENDING  Incomplete    Michel Bickers, MD [facility] [phone] pager   [phone] cell 11/11/2018, 11:10 AM

## 2018-11-11 NOTE — TOC Initial Note (Signed)
Transition of Care [facility]) - Initial/Assessment Note    Patient Details  Name: Randy Chen MRN: [id number] Date of Birth: [birth year]  Transition of Care [facility]) CM/SW Contact:    Marilu Favre, RN Phone Number: 11/11/2018, 3:32 PM  Clinical Narrative:                  Patient from home ( [place], [place]), with wife. Patient active with [facility] spoke to [place] with [facility] earlier this morning, she is aware patient admitted to hospital.   Patient states his wife was trained to do his IV infusions. Last time he discharged from the hospital he went to [facility] and then back to independent living with [facility]. Patient prefers this time to go directly to his independent living apartment with home health.   Will continue to follow. Per Hoyle Sauer at [place] [facility] is [employer] .   Will need OPAT prescription and home health orders . Expected Discharge Plan: [place] Barriers to Discharge: Continued Medical Work up   Patient Goals and CMS Choice Patient states their goals for this hospitalization and ongoing recovery are:: to return to independent living  CMS Medicare.gov Compare Post Acute Care list provided to:: Patient Choice offered to / list presented to : Patient  Expected Discharge Plan and Services Expected Discharge Plan: [place]   Discharge Planning Services: CM Consult Post Acute Care Choice: [place] arrangements for the past 2 months: Apartment                 DME Arranged: N/A DME Agency: NA                  Prior Living Arrangements/Services Living arrangements for the past 2 months: Apartment Lives with:: Spouse Patient language and need for interpreter reviewed:: Yes Do you feel safe going back to the place where you live?: Yes      Need for Family Participation in Patient Care: Yes (Comment) Care giver support system  in place?: Yes (comment) Current home services: Home RN Criminal Activity/Legal Involvement Pertinent to Current Situation/Hospitalization: No - Comment as needed  Activities of Daily Living Home Assistive Devices/Equipment: None ADL Screening (condition at time of admission) Patient's cognitive ability adequate to safely complete daily activities?: Yes Is the patient deaf or have difficulty hearing?: No Does the patient have difficulty seeing, even when wearing glasses/contacts?: No Does the patient have difficulty concentrating, remembering, or making decisions?: No Patient able to express need for assistance with ADLs?: Yes Does the patient have difficulty dressing or bathing?: No Independently performs ADLs?: Yes (appropriate for developmental age) Does the patient have difficulty walking or climbing stairs?: No Weakness of Legs: None Weakness of Arms/Hands: None  Permission Sought/Granted Permission sought to share information with : Case Manager Permission granted to share information with : Yes, Verbal Permission Granted     Permission granted to share info w AGENCY: [place]         Emotional Assessment Appearance:: Appears stated age Attitude/Demeanor/Rapport: Engaged Affect (typically observed): Accepting Orientation: : Oriented to Self, Oriented to Place, Oriented to  Time, Oriented to Situation Alcohol / Substance Use: Never Used Psych Involvement: No (comment)  Admission diagnosis:  Back, Hip, Abdominal Pain (prev. DX kidney infection) Patient Active Problem List   Diagnosis Date Noted  . Discitis 11/09/2018  . Osteomyelitis ([place]) 11/09/2018  . Acute lower  UTI 09/26/2018  . Bacteremia due to Enterococcus   . AKI (acute kidney injury) ([place]) 09/25/2018  . Hypertension 09/25/2018  . History of atherosclerotic cardiovascular disease 09/25/2018  . Bladder retention 09/25/2018  . Chronic anemia 09/25/2018  . Hyponatremia 09/25/2018  . Pulmonary nodule  09/25/2018  . PAD (peripheral artery disease) ([place]) 06/06/2017  . Fatigue 04/26/2017  . Claudication of both lower extremities ([place]) 04/26/2017  . Cellulitis of left lower leg 01/08/2015  . Cellulitis 01/08/2015  . S/P AVR 01/08/2015  . Chest discomfort 05/13/2014  . Chest pain 05/04/2014  . Diverticulosis 05/13/2013  . Vertigo 05/13/2013  . Colitis 06/23/2012  . Hx of CABG 11/18/2010  . History of prosthetic aortic valve 11/18/2010  . Benign hypertensive heart disease without heart failure 11/18/2010  . Hypercholesterolemia 11/18/2010  . History of prostate cancer 11/18/2010   PCP:  [name], MD Pharmacy:   [facility] [place], [place] [address] Phone: [phone] Fax: [phone]  [id number] - [place], [place] - [address] [place] AT [place] [address] [place] [address] Phone: [phone] Fax: [phone]  [facility] [place], [place] [address] Phone: [phone] Fax: [phone]     Social Determinants of Health ([place]) Interventions    Readmission Risk Interventions Readmission Risk Prevention Plan 09/28/2018  Transportation Screening Complete  PCP or Specialist Appt within 5-7 Days Complete  Home Care Screening Complete  Medication Review (RN CM) Complete  Some recent data might be hidden

## 2018-11-12 ENCOUNTER — Inpatient Hospital Stay: Payer: Self-pay

## 2018-11-12 ENCOUNTER — Encounter (HOSPITAL_COMMUNITY): Payer: Self-pay | Admitting: *Deleted

## 2018-11-12 DIAGNOSIS — M8600 Acute hematogenous osteomyelitis, unspecified site: Secondary | ICD-10-CM

## 2018-11-12 LAB — BASIC METABOLIC PANEL
Anion gap: 10 (ref 5–15)
BUN: 13 mg/dL (ref 8–23)
CO2: 26 mmol/L (ref 22–32)
Calcium: 8.4 mg/dL — ABNORMAL LOW (ref 8.9–10.3)
Chloride: 100 mmol/L (ref 98–111)
Creatinine, Ser: 1.01 mg/dL (ref 0.61–1.24)
GFR calc Af Amer: >60 mL/min (ref >60)
GFR calc non Af Amer: >60 mL/min (ref >60)
Glucose, Bld: 115 mg/dL — ABNORMAL HIGH (ref 70–99)
Potassium: 3.8 mmol/L (ref 3.5–5.1)
Sodium: 136 mmol/L (ref 135–145)

## 2018-11-12 LAB — CBC
HCT: 26.7 % — ABNORMAL LOW (ref 39.0–52.0)
Hemoglobin: 8.5 g/dL — ABNORMAL LOW (ref 13.0–17.0)
MCH: 27.9 pg (ref 26.0–34.0)
MCHC: 31.8 g/dL (ref 30.0–36.0)
MCV: 87.5 fL (ref 80.0–100.0)
Platelets: 256 10*3/uL (ref 150–400)
RBC: 3.05 MIL/uL — ABNORMAL LOW (ref 4.22–5.81)
RDW: 13.8 % (ref 11.5–15.5)
WBC: 8.1 10*3/uL (ref 4.0–10.5)
nRBC: 0 % (ref 0.0–0.2)

## 2018-11-12 MED ORDER — SODIUM CHLORIDE 0.9% FLUSH
10.0000 mL | INTRAVENOUS | Status: DC | PRN
  Administered 2018-11-13: 13:00:00 10 mL
  Filled 2018-11-12: qty 40

## 2018-11-12 NOTE — Plan of Care (Signed)

## 2018-11-12 NOTE — Progress Notes (Signed)
Physical Therapy Treatment Patient Details Name: Randy Chen MRN: [id number] DOB: [birth year] Today's Date: 11/12/2018    History of Present Illness Patient is a 85 y/o male who presents with fever and abdominal pain- has hx of enterococcal bacteremia with concern for discitis and/or possible prosthetic valve endocarditis, currently on antibiotics through a PICC line at home. Chest abdomen-new erosion and sclerosis of the L2-L3 disc space and adjacent endplates with mild adjacent perivertebral fat stranding, consistent with discitis osteomyelitis. PMH includes aortic stenosis s/p CABG, prostate ca, HTN.    PT Comments    Pt LE pain much improved since visit last week. Pt able to stand with supervision and ambulate 250' with RW and min-guard A maintaining appropriate pace for limited community mobility. Reviewed exercise program that he has been doing with HHPT and pt able to demonstrate all exercises. PT will continue to follow.    Follow Up Recommendations  Home health PT;Supervision for mobility/OOB;Supervision/Assistance - 24 hour     Equipment Recommendations  None recommended by PT    Recommendations for Other Services       Precautions / Restrictions Precautions Precautions: Fall Restrictions Weight Bearing Restrictions: No    Mobility  Bed Mobility               General bed mobility comments: pt received in chair  Transfers Overall transfer level: Needs assistance Equipment used: Rolling walker (2 wheeled) Transfers: Sit to/from Stand Sit to Stand: Supervision         General transfer comment: supervision from recliner, no physical assist needed  Ambulation/Gait Ambulation/Gait assistance: Min guard Gait Distance (Feet): 250 Feet Assistive device: Rolling walker (2 wheeled) Gait Pattern/deviations: Step-through pattern;Trunk flexed;Decreased stride length;Decreased step length - right;Decreased step length - left Gait velocity: decreased Gait  velocity interpretation: 1.31 - 2.62 ft/sec, indicative of limited community ambulator General Gait Details: denied pain in thighs today, maintained good pace but fatigued after 250'   Stairs             Wheelchair Mobility    Modified Rankin (Stroke Patients Only)       Balance Overall balance assessment: Mild deficits observed, not formally tested                                          Cognition Arousal/Alertness: Awake/alert Behavior During Therapy: WFL for tasks assessed/performed Overall Cognitive Status: Within Functional Limits for tasks assessed                                        Exercises      General Comments General comments (skin integrity, edema, etc.): reviewed HEP, pt able to demonstrate and verbalize      Pertinent Vitals/Pain Pain Assessment: No/denies pain    Home Living                      Prior Function            PT Goals (current goals can now be found in the care plan section) Acute Rehab PT Goals Patient Stated Goal: to get this infection and pain to go away PT Goal Formulation: With patient Time For Goal Achievement: 11/24/18 Potential to Achieve Goals: Good Progress towards PT goals: Progressing toward goals    Frequency  Min 3X/week      PT Plan Current plan remains appropriate    Co-evaluation              AM-PAC PT "6 Clicks" Mobility   Outcome Measure  Help needed turning from your back to your side while in a flat bed without using bedrails?: A Little Help needed moving from lying on your back to sitting on the side of a flat bed without using bedrails?: A Little Help needed moving to and from a bed to a chair (including a wheelchair)?: A Little Help needed standing up from a chair using your arms (e.g., wheelchair or bedside chair)?: A Little Help needed to walk in hospital room?: A Little Help needed climbing 3-5 steps with a railing? : A Little 6 Click  Score: 18    End of Session Equipment Utilized During Treatment: Gait belt Activity Tolerance: Patient tolerated treatment well Patient left: in chair;with call bell/phone within reach;with chair alarm set Nurse Communication: Mobility status PT Visit Diagnosis: Difficulty in walking, not elsewhere classified (R26.2)     Time: 5681-2751 PT Time Calculation (min) (ACUTE ONLY): 20 min  Charges:  $Gait Training: 8-22 mins                     Leighton Roach, [place]  Pager [phone] Office [place] 11/12/2018, 4:02 PM

## 2018-11-12 NOTE — Progress Notes (Signed)
PHARMACY CONSULT NOTE FOR:  OUTPATIENT  PARENTERAL ANTIBIOTIC THERAPY (OPAT)  Indication: Discitis Regimen: Ampicillin 2gm IV q8h, Ceftriaxone 2gm q12 End date: 12/05/2018  IV antibiotic discharge orders are pended. To discharging provider:  please sign these orders via discharge navigator,  Select New Orders & click on the button choice - Manage This Unsigned Work.     Thank you for allowing pharmacy to be a part of this patient's care.  Minda Ditto PharmD 11/12/2018, 1:22 PM

## 2018-11-12 NOTE — TOC Progression Note (Addendum)
Transition of Care [facility]) - Progression Note    Patient Details  Name: Randy Chen MRN: [id number] Date of Birth: [birth year]  Transition of Care [facility]) CM/SW Contact  Mahealani Sulak, Edson Snowball, RN Phone Number: 11/12/2018, 10:35 AM  Clinical Narrative:     Expected discharge date 11/13/18. SW spoke with [place] at [facility] , patient can return to his apartment with his wife at discharge. He will need to quarantine himself in his apartment for 14 days. Patient aware and agreeable.   Hoyle Sauer with [facility] aware of expected discharge date.   Will need home health orders for RN,PT,OT, and OPAT script.   Hoyle Sauer will arrange [facility] for infusion company. Emmit Pomfret with [facility] aware.  Patient's wife will pick him up at discharge and drive home   Expected Discharge Plan: [place] Barriers to Discharge: Continued Medical Work up  Expected Discharge Plan and Services Expected Discharge Plan: [place]   Discharge Planning Services: CM Consult Post Acute Care Choice: [place] arrangements for the past 2 months: Apartment                 DME Arranged: N/A DME Agency: NA       HH Arranged: PT, OT, RN [place] Agency: [place] Date [place]: 11/12/18 Time [place]: 1034 Representative spoke with at [place]: [place] ([place]) Interventions    Readmission Risk Interventions Readmission Risk Prevention Plan 09/28/2018  Transportation Screening Complete  PCP or Specialist Appt within 5-7 Days Complete  Home Care Screening Complete  Medication Review (RN CM) Complete  Some recent data might be hidden

## 2018-11-12 NOTE — Progress Notes (Signed)
Occupational Therapy Treatment Patient Details Name: Randy Chen MRN: [id number] DOB: 20-Oct-1927 Today's Date: 11/12/2018    History of present illness Patient is a 85 y/o male who presents with fever and abdominal pain- has hx of enterococcal bacteremia with concern for discitis and/or possible prosthetic valve endocarditis, currently on antibiotics through a PICC line at home. Chest abdomen-new erosion and sclerosis of the L2-L3 disc space and adjacent endplates with mild adjacent perivertebral fat stranding, consistent with discitis osteomyelitis. PMH includes aortic stenosis s/p CABG, prostate ca, HTN.   OT comments  Pt progressing toward OT goals. Pt tolerated session well, however, described some complaints of back pain during transfer. Repositioning in chair relieved pain. Pt required Min A to transition to EOB with 1 hand held assist due to UE weakness. Min Guard functional mobility. Pt will benefit from continued OT to address safe transfers and ADL engagement for home environment. OT will continue to follow acutely.   Follow Up Recommendations  Home health OT;Supervision/Assistance - 24 hour    Equipment Recommendations       Recommendations for Other Services      Precautions / Restrictions Precautions Precautions: Fall Restrictions Weight Bearing Restrictions: No       Mobility Bed Mobility Overal bed mobility: Needs Assistance Bed Mobility: Rolling;Sidelying to Sit Rolling: Min guard Sidelying to sit: HOB elevated;Min assist       General bed mobility comments: Pt required one hand held assist to transition from supine to sitting at EOB. Overall pt able to demonstrate sitting upright with use of BUE for support.   Transfers Overall transfer level: Needs assistance Equipment used: Rolling walker (2 wheeled) Transfers: Sit to/from Stand Sit to Stand: Min guard;From elevated surface         General transfer comment: Min guard for safety.     Balance  Overall balance assessment: Mild deficits observed, not formally tested                                         ADL either performed or assessed with clinical judgement   ADL Overall ADL's : Needs assistance/impaired                         Toilet Transfer: Min Psychiatric nurse Details (indicate cue type and reason): Simulated toilet transfer from bed to recliner, with VCs for hand placement and use of hand rails. Pt reported an increase in back pain during transfer.          Functional mobility during ADLs: Min guard;Rolling walker;Cueing for sequencing       Vision       Perception     Praxis      Cognition Arousal/Alertness: Awake/alert Behavior During Therapy: WFL for tasks assessed/performed Overall Cognitive Status: Within Functional Limits for tasks assessed                                          Exercises     Shoulder Instructions       General Comments      Pertinent Vitals/ Pain       Pain Assessment: 0-10 Pain Score: 8  Pain Location: back Pain Descriptors / Indicators: Sore;Aching;Shooting Pain Intervention(s): Monitored during session;Repositioned;Premedicated before session  Home Living  Prior Functioning/Environment              Frequency  Min 2X/week        Progress Toward Goals  OT Goals(current goals can now be found in the care plan section)  Progress towards OT goals: Progressing toward goals  Acute Rehab OT Goals Patient Stated Goal: to get this infection and pain to go away OT Goal Formulation: With patient Time For Goal Achievement: 11/24/18 Potential to Achieve Goals: Good ADL Goals Pt Will Perform Grooming: with modified independence;sitting Pt Will Perform Lower Body Bathing: with modified independence;sitting/lateral leans Pt Will Perform Lower Body Dressing: with modified independence;sitting/lateral  leans Pt Will Transfer to Toilet: with modified independence;ambulating Pt Will Perform Tub/Shower Transfer: with modified independence;rolling walker;ambulating  Plan Discharge plan remains appropriate;Frequency remains appropriate    Co-evaluation                 AM-PAC OT "6 Clicks" Daily Activity     Outcome Measure   Help from another person eating meals?: None Help from another person taking care of personal grooming?: None Help from another person toileting, which includes using toliet, bedpan, or urinal?: A Little Help from another person bathing (including washing, rinsing, drying)?: A Little Help from another person to put on and taking off regular upper body clothing?: None Help from another person to put on and taking off regular lower body clothing?: A Little 6 Click Score: 21    End of Session Equipment Utilized During Treatment: Gait belt;Rolling walker  OT Visit Diagnosis: Unsteadiness on feet (R26.81);Pain;Muscle weakness (generalized) (M62.81)   Activity Tolerance Patient tolerated treatment well   Patient Left in bed;with call bell/phone within reach   Nurse Communication          Time: 5320-2334 OT Time Calculation (min): 12 min  Charges: OT General Charges $OT Visit: 1 Visit OT Treatments $Self Care/Home Management : 8-22 mins  Randy Chen, MSOT, OTR/L  [facility]  [phone]    Randy Chen 11/12/2018, 10:14 AM

## 2018-11-12 NOTE — Progress Notes (Signed)
Patient ID: Randy Chen, male   DOB: Jul 15, 1927, 85 y.o.   MRN: [id number]         [facility]  Date of Admission:  11/09/2018           Day 47 ampicillin        Day 47 ceftriaxone ASSESSMENT: He has lumbar discitis and osteomyelitis complicating his recent enterococcal bacteremia.  He remains afebrile.  Repeat blood cultures are negative.  Lumbar aspirate cultures remain negative at 48 hours.  I strongly suspect that his discitis has been present since his initial bacteremia.  However, I will extend his treatment for an additional 4 weeks.  I will have a new PICC line placed now.  PLAN: 1. Continue current antibiotics pending final cultures  Diagnosis: Bacteremia and lumbar discitis  Culture Result: Enterococcus  No Known Allergies  OPAT Orders Discharge antibiotics: Per pharmacy protocol ampicillin and ceftriaxone  Duration: 10 weeks End Date: 12/05/2018  [facility] Care Per Protocol:  Labs weekly while on IV antibiotics: _x_ CBC with differential _x_ BMP __ CMP _x_ CRP _x_ ESR __ Vancomycin trough __ CK  __ Please pull PIC at completion of IV antibiotics _x_ Please leave PIC in place until doctor has seen patient or been notified  Fax weekly labs to [phone]  Clinic Follow Up Appt: 12/03/2018   Principal Problem:   Osteomyelitis ([place]) Active Problems:   Chronic anemia   Pulmonary nodule   Bacteremia due to Enterococcus   Discitis   Scheduled Meds: . amLODipine  10 mg Oral Daily  . aspirin EC  81 mg Oral Daily  . atorvastatin  40 mg Oral Daily  . cholecalciferol  2,000 Units Oral Daily  . folic acid  470 mcg Oral Daily  . metoprolol succinate  50 mg Oral Daily  . multivitamin with minerals  1 tablet Oral Daily  . timolol  1 drop Right Eye BID  . vitamin C  500 mg Oral Daily   Continuous Infusions: . ampicillin (OMNIPEN) IV 2 g (11/12/18 0817)  . cefTRIAXone (ROCEPHIN)  IV 2 g (11/12/18 1014)   PRN  Meds:.acetaminophen **OR** acetaminophen, hydrALAZINE, HYDROcodone-acetaminophen, morphine injection   SUBJECTIVE: His back pain has improved.  He recalls having back pain when he was initially hospitalized with enterococcal bacteremia.  His history on whether or not the pain has gotten worse recently seems to be quite variable.  However he is fairly clear that he is feeling better now and eager for discharge back home with his wife.  Review of Systems: Review of Systems  Constitutional: Negative for chills, diaphoresis and fever.  Respiratory: Negative for cough.   Cardiovascular: Negative for chest pain.  Gastrointestinal: Negative for abdominal pain, diarrhea, nausea and vomiting.  Genitourinary: Negative for dysuria.  Musculoskeletal: Positive for back pain.    No Known Allergies  OBJECTIVE: Vitals:   11/11/18 1917 11/12/18 0501 11/12/18 1020 11/12/18 1250  BP: (!) 147/72 140/74 139/62 (!) 150/73  Pulse: 73 84 93 75  Resp: '19 16  18  ' Temp: 98.1 F (36.7 C) 97.7 F (36.5 C)  98.7 F (37.1 C)  TempSrc: Oral Oral  Oral  SpO2: 98% 96%  96%  Weight:      Height:       Body mass index is 28.82 kg/m.  Physical Exam Constitutional:      Comments: He is sitting up in a chair.  He is pleasant and in good spirits.  Cardiovascular:  Rate and Rhythm: Normal rate and regular rhythm.     Heart sounds: Murmur present.     Comments: Early 2/6 systolic murmur heard best at the right upper sternal border. Pulmonary:     Effort: Pulmonary effort is normal.     Breath sounds: Normal breath sounds.  Psychiatric:        Mood and Affect: Mood normal.     Lab Results Lab Results  Component Value Date   WBC 8.1 11/12/2018   HGB 8.5 (L) 11/12/2018   HCT 26.7 (L) 11/12/2018   MCV 87.5 11/12/2018   PLT 256 11/12/2018    Lab Results  Component Value Date   CREATININE 1.01 11/12/2018   BUN 13 11/12/2018   NA 136 11/12/2018   K 3.8 11/12/2018   CL 100 11/12/2018   CO2 26  11/12/2018    Lab Results  Component Value Date   ALT 17 11/11/2018   AST 27 11/11/2018   ALKPHOS 98 11/11/2018   BILITOT 0.4 11/11/2018     Microbiology: Recent Results (from the past 240 hour(s))  Blood Culture (routine x 2)     Status: None (Preliminary result)   Collection Time: 11/09/18 12:30 PM  Result Value Ref Range Status   Specimen Description   Final    BLOOD RIGHT ARM Performed at [facility], [address] [place], [place] [address]    Special Requests   Final    BOTTLES DRAWN AEROBIC AND ANAEROBIC Blood Culture adequate volume Performed at [facility], [address] [place], [place] [address]    Culture   Final    NO GROWTH 3 DAYS Performed at [facility], [place] [address] [place], [place] [address]    Report Status PENDING  Incomplete  Blood Culture (routine x 2)     Status: None (Preliminary result)   Collection Time: 11/09/18 12:45 PM  Result Value Ref Range Status   Specimen Description   Final    BLOOD LEFT ARM Performed at [facility], [address] [place], [place] [address]    Special Requests   Final    BOTTLES DRAWN AEROBIC AND ANAEROBIC Blood Culture adequate volume Performed at [facility], [address] [place], [place] [address]    Culture   Final    NO GROWTH 3 DAYS Performed at [facility], [place] [address] [place], [place] [address]    Report Status PENDING  Incomplete  SARS Coronavirus 2 (Hosp order,Performed in [facility] lab via [employer])     Status: None   Collection Time: 11/09/18 12:49 PM  Result Value Ref Range Status   SARS Coronavirus 2 ([employer] Now) NEGATIVE NEGATIVE Final    Comment: (NOTE) Interpretive Result Comment(s): COVID 19 Positive SARS CoV 2 target nucleic acids are DETECTED. The SARS CoV 2 RNA is generally detectable in upper and lower respiratory specimens during the acute phase of infection.  Positive results are indicative of active  infection with SARS CoV 2.  Clinical correlation with patient history and other diagnostic information is necessary to determine patient infection status.  Positive results do not rule out bacterial infection or coinfection with other viruses. The expected result is Negative. COVID 19 Negative SARS CoV 2 target nucleic acids are NOT DETECTED. The SARS CoV 2 RNA is generally detectable in upper and lower respiratory specimens during the acute phase of infection.  Negative results do not  preclude SARS CoV 2 infection, do not rule out coinfections with other pathogens, and should not be used as the sole basis for treatment or other patient management decisions.  Negative results must be combined with clinical  observations, patient history, and epidemiological information. The expected result is Negative. Invalid Presence or absence of SARS CoV 2 nucleic acids cannot be determined. Repeat testing was performed on the submitted specimen and repeated Invalid results were obtained.  If clinically indicated, additional testing on a new specimen with an alternate test methodology [phone]) is advised.  The SARS CoV 2 RNA is generally detectable in upper and lower respiratory specimens during the acute phase of infection. The expected result is Negative. Fact Sheet for Patients:  G[employer]ing[employer].no Fact Sheet for Healthcare Providers: https://www.hernandez-brewer.com/ This test is not yet approved or cleared by the [place] FDA and has been authorized for detection and/or diagnosis of SARS CoV 2 by FDA under an Emergency Use Authorization (EUA).  This EUA will remain in effect (meaning this test can be used) for the duration of the COVID19 d eclaration under Section 564(b)(1) of the Act, 21 U.S.C. section [phone] 3(b)(1), unless the authorization is terminated or revoked sooner. Performed at [facility], [address], [place], [place]  [address]   Urine culture     Status: None   Collection Time: 11/09/18  2:42 PM  Result Value Ref Range Status   Specimen Description   Final    URINE, RANDOM Performed at [facility], [address] [place], [place] [address]    Special Requests   Final    NONE Performed at [facility], [address] [place], [place] [address]    Culture   Final    NO GROWTH Performed at [facility], [place] [address], [place], [place] [address]    Report Status 11/11/2018 FINAL  Final  Surgical PCR screen     Status: None   Collection Time: 11/10/18  5:58 AM  Result Value Ref Range Status   MRSA, PCR NEGATIVE NEGATIVE Final   Staphylococcus aureus NEGATIVE NEGATIVE Final    Comment: (NOTE) The Xpert SA Assay (FDA approved for NASAL specimens in patients 44 years of age and older), is one component of a comprehensive surveillance program. It is not intended to diagnose infection nor to guide or monitor treatment. Performed at [facility], [place] [address], [place], [place] [address]   Aerobic/Anaerobic Culture (surgical/deep wound)     Status: None (Preliminary result)   Collection Time: 11/10/18 12:40 PM  Result Value Ref Range Status   Specimen Description NEEDLE ASPIRATE DISC  Final   Special Requests NONE  Final   Gram Stain   Final    RARE WBC PRESENT, PREDOMINANTLY PMN NO ORGANISMS SEEN    Culture   Final    NO GROWTH 2 DAYS NO ANAEROBES ISOLATED; CULTURE IN PROGRESS FOR 5 DAYS Performed at [facility], [place] [address] [place], [place] [address]    Report Status PENDING  Incomplete    Michel Bickers, MD [facility] [place] [facility] [phone] pager   [phone] cell 11/12/2018, 1:01 PM

## 2018-11-12 NOTE — Progress Notes (Addendum)
PROGRESS NOTE    Randy Chen  GMW:[id number] DOB: Nov 29, 1927 DOA: 11/09/2018 PCP: [name], MD    Brief Narrative:  85 y.o. male with medical history significant of enterococcal bacteremia with concern for discitis and/or possible prosthetic valve endocarditis, currently on antibiotics through a PICC line at home (Rocephin and ampicillin), CAD status post CABG, prostate cancer, prostatectomy, hypertension, hyperlipidemia, aortic stenosis status post bioprosthetic aortic valve replacement, PAD, and conditions listed below presented to [facility] for evaluation of lower abdominal pain.  Patient states for the past 3 days he is having intermittent severe pain in his lower abdomen and lower back.  States the pain is worse whenever he tries to move.  Denies any fevers or chills at home.  Denies any nausea, vomiting, diarrhea, or dysuria.  States his last bowel movement was yesterday.  Denies any melena or hematochezia.  Denies saddle anesthesia, lower extremity weakness or numbness, or fecal/urinary incontinence.  States he has been getting antibiotics through his PICC line at home.  Assessment & Plan:   Principal Problem:   Osteomyelitis ([place]) Active Problems:   Chronic anemia   Pulmonary nodule   Bacteremia due to Enterococcus   Discitis  L2-L3 discitis/osteomyelitis -suspect secondary to enterococcal bacteremia -Patient has a recent history of enterococcal bacteremia with concern for discitis and/or possible prosthetic valve endocarditis from an admission from April 8 to April 15 and discharged on a 6-week course of Rocephin and ampicillin via PICC line. -CT abdomen pelvis showing new erosion and sclerosis of the L2-L3 disc space and adjacent endplates with mild adjacent perivertebral fat stranding, consistent with discitis osteomyelitis.  -Biopsy obtained 5/24, results pending -MRI lumbar spine with contrast personally reviewed, with findings of L2-3 discitis and  osteomyelitis -will continue with analgesics as needed -Blood culture x2 thus far neg -ID recommends continue ampicillin and rocephin pending finalization of cultures -Have ordered new PICC to be placed  CKD stage III Creatinine 1.3, improved since labs done a month ago.  -Cr continuing to trend down, currently 1.01 -Repeat bmet in AM  Chronic anemia -Hemoglobin 7.7 at present, was in the 9-10 range a month ago.  Patient denies any melena or hematochezia. -Hgb up to 8.5 -repeat cbc in AM  Pulmonary nodule -CT showing lobulated 1.1 cm nodule of the right lower lobe, unchanged from recent prior examination and very slightly enlarged compared to remote prior examination dated 06/24/2012.  Patient is a former smoker. -Patient will need a follow-up CT in 3 to 6 months to ensure stability.  Hypertension -Continue amlodipine, metoprolol as tolerated -BP currently stable  CAD status post CABG -Continue aspirin, statin, beta-blocker -Remains chest pain free at this time  Hyperlipidemia -Continue Lipitor -Presently stable  DVT prophylaxis: SCD's Code Status: DNR Family Communication: Pt in room, family not at bedside Disposition Plan: Uncertain at this time  Consultants:   IR  ID  Procedures:   Lumbar spine biopsy 5/24  Antimicrobials: Anti-infectives (From admission, onward)   Start     Dose/Rate Route Frequency Ordered Stop   11/09/18 2100  cefTRIAXone (ROCEPHIN) 2 g in sodium chloride 0.9 % 100 mL IVPB     2 g 200 mL/hr over 30 Minutes Intravenous Every 12 hours 11/09/18 1322     11/09/18 1737  ampicillin (OMNIPEN) 2 g injection    Note to Pharmacy:  Danie Binder : cabinet override      11/09/18 1737 11/10/18 0544   11/09/18 1700  ampicillin (OMNIPEN) 2 g in  sodium chloride 0.9 % 100 mL IVPB     2 g 300 mL/hr over 20 Minutes Intravenous Every 8 hours 11/09/18 1322        Subjective: Reports feeling better. Still some low back pain  Objective:  Vitals:   11/11/18 1917 11/12/18 0501 11/12/18 1020 11/12/18 1250  BP: (!) 147/72 140/74 139/62 (!) 150/73  Pulse: 73 84 93 75  Resp: 19 16  18   Temp: 98.1 F (36.7 C) 97.7 F (36.5 C)  98.7 F (37.1 C)  TempSrc: Oral Oral  Oral  SpO2: 98% 96%  96%  Weight:      Height:        Intake/Output Summary (Last 24 hours) at 11/12/2018 1545 Last data filed at 11/12/2018 1300 Gross per 24 hour  Intake 740 ml  Output 1300 ml  Net -560 ml   Filed Weights   11/09/18 1413  Weight: 83.5 kg    Examination: General exam: Conversant, in no acute distress, sitting in chair Respiratory system: normal chest rise, clear, no audible wheezing Cardiovascular system: regular rhythm, s1-s2 Gastrointestinal system: Nondistended, nontender, pos BS Central nervous system: No seizures, no tremors Extremities: No cyanosis, no joint deformities Skin: No rashes, no pallor Psychiatry: Affect normal // no auditory hallucinations   Data Reviewed: I have personally reviewed following labs and imaging studies  CBC: Recent Labs  Lab 11/09/18 1233 11/10/18 0341 11/11/18 0415 11/12/18 0313  WBC 7.9 6.5 6.9 8.1  NEUTROABS 5.9  --   --   --   HGB 8.6* 7.7* 8.3* 8.5*  HCT 27.5* 24.1* 25.9* 26.7*  MCV 89.6 87.6 87.8 87.5  PLT 299 245 238 371   Basic Metabolic Panel: Recent Labs  Lab 11/09/18 1233 11/10/18 0341 11/11/18 0415 11/12/18 0313  NA 134* 136 138 136  K 3.6 3.3* 3.6 3.8  CL 99 103 103 100  CO2 24 24 26 26   GLUCOSE 122* 89 104* 115*  BUN 18 13 14 13   CREATININE 1.32* 1.16 1.11 1.01  CALCIUM 8.3* 8.0* 8.3* 8.4*   GFR: Estimated Creatinine Clearance: 52.3 mL/min (by C-G formula based on SCr of 1.01 mg/dL). Liver Function Tests: Recent Labs  Lab 11/09/18 1233 11/11/18 0415  AST 25 27  ALT 18 17  ALKPHOS 94 98  BILITOT 0.4 0.4  PROT 6.3* 5.3*  ALBUMIN 3.0* 2.3*   No results for input(s): LIPASE, AMYLASE in the last 168 hours. No results for input(s): AMMONIA in the last 168  hours. Coagulation Profile: Recent Labs  Lab 11/10/18 0940  INR 1.1   Cardiac Enzymes: No results for input(s): CKTOTAL, CKMB, CKMBINDEX, TROPONINI in the last 168 hours. BNP (last 3 results) No results for input(s): PROBNP in the last 8760 hours. HbA1C: No results for input(s): HGBA1C in the last 72 hours. CBG: No results for input(s): GLUCAP in the last 168 hours. Lipid Profile: No results for input(s): CHOL, HDL, LDLCALC, TRIG, CHOLHDL, LDLDIRECT in the last 72 hours. Thyroid Function Tests: No results for input(s): TSH, T4TOTAL, FREET4, T3FREE, THYROIDAB in the last 72 hours. Anemia Panel: No results for input(s): VITAMINB12, FOLATE, FERRITIN, TIBC, IRON, RETICCTPCT in the last 72 hours. Sepsis Labs: Recent Labs  Lab 11/09/18 1233  LATICACIDVEN 1.8    Recent Results (from the past 240 hour(s))  Blood Culture (routine x 2)     Status: None (Preliminary result)   Collection Time: 11/09/18 12:30 PM  Result Value Ref Range Status   Specimen Description   Final  BLOOD RIGHT ARM Performed at [facility], [address] [place], [place] [address]    Special Requests   Final    BOTTLES DRAWN AEROBIC AND ANAEROBIC Blood Culture adequate volume Performed at [facility], [address] [place], [place] [address]    Culture   Final    NO GROWTH 3 DAYS Performed at [facility], [place] [address], [place], [place] [address]    Report Status PENDING  Incomplete  Blood Culture (routine x 2)     Status: None (Preliminary result)   Collection Time: 11/09/18 12:45 PM  Result Value Ref Range Status   Specimen Description   Final    BLOOD LEFT ARM Performed at [facility], [address] [place], [place] [address]    Special Requests   Final    BOTTLES DRAWN AEROBIC AND ANAEROBIC Blood Culture adequate volume Performed at [facility], [address] [place], [place] [address]    Culture   Final    NO GROWTH 3 DAYS  Performed at [facility], [place] [address], [place], [place] [address]    Report Status PENDING  Incomplete  SARS Coronavirus 2 (Hosp order,Performed in [facility] lab via [employer] ID)     Status: None   Collection Time: 11/09/18 12:49 PM  Result Value Ref Range Status   SARS Coronavirus 2 ([employer] Now) NEGATIVE NEGATIVE Final    Comment: (NOTE) Interpretive Result Comment(s): COVID 19 Positive SARS CoV 2 target nucleic acids are DETECTED. The SARS CoV 2 RNA is generally detectable in upper and lower respiratory specimens during the acute phase of infection.  Positive results are indicative of active infection with SARS CoV 2.  Clinical correlation with patient history and other diagnostic information is necessary to determine patient infection status.  Positive results do not rule out bacterial infection or coinfection with other viruses. The expected result is Negative. COVID 19 Negative SARS CoV 2 target nucleic acids are NOT DETECTED. The SARS CoV 2 RNA is generally detectable in upper and lower respiratory specimens during the acute phase of infection.  Negative results do not preclude SARS CoV 2 infection, do not rule out coinfections with other pathogens, and should not be used as the sole basis for treatment or other patient management decisions.  Negative results must be combined with clinical  observations, patient history, and epidemiological information. The expected result is Negative. Invalid Presence or absence of SARS CoV 2 nucleic acids cannot be determined. Repeat testing was performed on the submitted specimen and repeated Invalid results were obtained.  If clinically indicated, additional testing on a new specimen with an alternate test methodology [phone]) is advised.  The SARS CoV 2 RNA is generally detectable in upper and lower respiratory specimens during the acute phase of infection. The expected result is Negative. Fact Sheet for Patients:   GolfingFamily.no Fact Sheet for Healthcare Providers: https://www.hernandez-brewer.com/ This test is not yet approved or cleared by the [place] FDA and has been authorized for detection and/or diagnosis of SARS CoV 2 by FDA under an Emergency Use Authorization (EUA).  This EUA will remain in effect (meaning this test can be used) for the duration of the COVID19 d eclaration under Section 564(b)(1) of the Act, 21 U.S.C. section 8168092474 3(b)(1), unless the authorization is terminated or revoked sooner. Performed at [facility], [address] [place], [place] [address]   Urine  culture     Status: None   Collection Time: 11/09/18  2:42 PM  Result Value Ref Range Status   Specimen Description   Final    URINE, RANDOM Performed at [facility], [address] [place], [place] [address]    Special Requests   Final    NONE Performed at [facility], [address] [place], [place] [address]    Culture   Final    NO GROWTH Performed at [facility], [place] [address], [place], [place] [address]    Report Status 11/11/2018 FINAL  Final  Surgical PCR screen     Status: None   Collection Time: 11/10/18  5:58 AM  Result Value Ref Range Status   MRSA, PCR NEGATIVE NEGATIVE Final   Staphylococcus aureus NEGATIVE NEGATIVE Final    Comment: (NOTE) The Xpert SA Assay (FDA approved for NASAL specimens in patients 53 years of age and older), is one component of a comprehensive surveillance program. It is not intended to diagnose infection nor to guide or monitor treatment. Performed at [facility], [place] [address] [place], [place] [address]   Aerobic/Anaerobic Culture (surgical/deep wound)     Status: None (Preliminary result)   Collection Time: 11/10/18 12:40 PM  Result Value Ref Range Status   Specimen Description NEEDLE ASPIRATE DISC  Final   Special Requests NONE  Final   Gram Stain   Final    RARE  WBC PRESENT, PREDOMINANTLY PMN NO ORGANISMS SEEN    Culture   Final    NO GROWTH 2 DAYS NO ANAEROBES ISOLATED; CULTURE IN PROGRESS FOR 5 DAYS Performed at [facility], [place] [address] [place], [place] [address]    Report Status PENDING  Incomplete     Radiology Studies: [place] Ekg Site Rite  Result Date: 11/12/2018 If Site Rite image not attached, placement could not be confirmed due to current cardiac rhythm.  [place] Ekg Site Rite  Result Date: 11/12/2018 If [facility] image not attached, placement could not be confirmed due to current cardiac rhythm.   Scheduled Meds: . amLODipine  10 mg Oral Daily  . aspirin EC  81 mg Oral Daily  . atorvastatin  40 mg Oral Daily  . cholecalciferol  2,000 Units Oral Daily  . folic acid  390 mcg Oral Daily  . metoprolol succinate  50 mg Oral Daily  . multivitamin with minerals  1 tablet Oral Daily  . timolol  1 drop Right Eye BID  . vitamin C  500 mg Oral Daily   Continuous Infusions: . ampicillin (OMNIPEN) IV 2 g (11/12/18 0817)  . cefTRIAXone (ROCEPHIN)  IV 2 g (11/12/18 1014)     LOS: 3 days   Marylu Lund, MD [facility] Pager On Amion  If 7PM-7AM, please contact night-coverage 11/12/2018, 3:45 PM

## 2018-11-12 NOTE — Progress Notes (Signed)
Peripherally Inserted Central Catheter/Midline Placement  The IV Nurse has discussed with the patient and/or persons authorized to consent for the patient, the purpose of this procedure and the potential benefits and risks involved with this procedure.  The benefits include less needle sticks, lab draws from the catheter, and the patient may be discharged home with the catheter. Risks include, but not limited to, infection, bleeding, blood clot (thrombus formation), and puncture of an artery; nerve damage and irregular heartbeat and possibility to perform a PICC exchange if needed/ordered by physician.  Alternatives to this procedure were also discussed.  Bard Power PICC patient education guide, fact sheet on infection prevention and patient information card has been provided to patient /or left at bedside.    PICC/Midline Placement Documentation  PICC Single Lumen 15/94/70 PICC Right Basilic 41 cm 1 cm (Active)  Indication for Insertion or Continuance of Line Home intravenous therapies (PICC only) 11/12/2018  5:18 PM  Exposed Catheter (cm) 1 cm 11/12/2018  5:18 PM  Site Assessment Clean;Dry;Intact 11/12/2018  5:18 PM  Line Status Flushed;Blood return noted;Saline locked 11/12/2018  5:18 PM  Dressing Type Transparent 11/12/2018  5:18 PM  Dressing Status Dry;Clean;Intact;Antimicrobial disc in place 11/12/2018  5:18 PM  Dressing Change Due 11/19/18 11/12/2018  5:18 PM       [address] 11/12/2018, 5:20 PM

## 2018-11-13 DIAGNOSIS — N390 Urinary tract infection, site not specified: Secondary | ICD-10-CM | POA: Diagnosis not present

## 2018-11-13 LAB — C-REACTIVE PROTEIN: CRP: 2.2 mg/dL — ABNORMAL HIGH (ref <1.0)

## 2018-11-13 LAB — SEDIMENTATION RATE: Sed Rate: 59 mm/hr — ABNORMAL HIGH (ref 0–16)

## 2018-11-13 MED ORDER — HEPARIN SOD (PORK) LOCK FLUSH 100 UNIT/ML IV SOLN
250.0000 [IU] | INTRAVENOUS | Status: AC | PRN
  Administered 2018-11-13: 13:00:00 250 [IU]

## 2018-11-13 MED ORDER — AMPICILLIN IV (FOR PTA / DISCHARGE USE ONLY): 2.0000 g | Freq: Three times a day (TID) | INTRAVENOUS | 0 refills | Status: AC

## 2018-11-13 MED ORDER — CEFTRIAXONE IV (FOR PTA / DISCHARGE USE ONLY): 2.0000 g | Freq: Two times a day (BID) | INTRAVENOUS | 0 refills | Status: DC

## 2018-11-13 MED ORDER — AMLODIPINE BESYLATE 10 MG PO TABS: 10.0000 mg | ORAL_TABLET | Freq: Every day | ORAL | 0 refills | Status: DC

## 2018-11-13 NOTE — Discharge Instructions (Addendum)
Osteomyelitis, Adult  Bone infections (osteomyelitis) occur when bacteria or other germs get inside a bone. This can happen if you have an infection in another part of your body that spreads through your blood. Germs from your skin or from outside of your body can also cause this type of infection if you have a wound or a broken bone (fracture) that breaks the skin. Bone infections need to be treated quickly to prevent bone damage and to prevent the infection from spreading to other areas of your body. What are the causes? Most bone infections are caused by bacteria. They can also be caused by other germs, such as viruses and funguses. What increases the risk? You are more likely to develop this condition if you:  Recently had surgery, especially bone or joint surgery.  Have a long-term (chronic) disease, such as: ? Diabetes. ? HIV (human immunodeficiency virus). ? Rheumatoid arthritis. ? Sickle cell anemia. ? Kidney disease that requires dialysis.  Are aged 56 years or older.  Have a condition or take medicines that block or weaken your body's defense system (immune system).  Have a condition that reduces your blood flow.  Have an artificial joint.  Have had a joint or bone repaired with plates or screws (surgical hardware).  Use IV drugs.  Have a central line for IV access.  Have had trauma, such as stepping on a nail or a broken bone that came through the skin. What are the signs or symptoms? Symptoms vary depending on the type and location of your infection. Common symptoms of bone infections include:  Fever and chills.  Skin redness and warmth.  Swelling.  Pain and stiffness.  Drainage of fluid or pus near the infection. How is this diagnosed? This condition may be diagnosed based on:  Your symptoms and medical history.  A physical exam.  Tests, such as: ? A sample of tissue, fluid, or blood taken to be examined under a microscope. ? Pus or discharge swabbed  from a wound for testing to identify germs and to determine what type of medicine will kill them (culture and sensitivity). ? Blood tests.  Imaging studies. These may include: ? X-rays. ? MRI. ? CT scan. ? Bone scan. ? Ultrasound. How is this treated? Treatment for this condition depends on the cause and type of infection. Antibiotic medicines are usually the first treatment for a bone infection. This may be done in a hospital at first. You may have to continue IV antibiotics at home or take antibiotics by mouth for several weeks after that. Other treatments may include surgery to remove:  Dead or dying tissue from a bone.  An infected artificial joint.  Infected plates or screws that were used to repair a broken bone. Follow these instructions at home: Medicines   Take over-the-counter and prescription medicines only as told by your health care provider.  Take your antibiotic medicine as told by your health care provider. Do not stop taking the antibiotic even if you start to feel better.  Follow instructions from your health care provider about how to take IV antibiotics at home. You may need to have a nurse come to your home to give you the IV antibiotics. General instructions   Ask your health care provider if you have any restrictions on your activities.  If directed, put ice on the affected area: ? Put ice in a plastic bag. ? Place a towel between your skin and the bag. ? Leave the ice on for 20  give you the IV antibiotics.  General instructions    · Ask your health care provider if you have any restrictions on your activities.  · If directed, put ice on the affected area:  ? Put ice in a plastic bag.  ? Place a towel between your skin and the bag.  ? Leave the ice on for 20 minutes, 2-3 times a day.  · Wash your hands often with soap and water. If soap and water are not available, use hand sanitizer.  · Do not use any products that contain nicotine or tobacco, such as cigarettes and e-cigarettes. These can delay bone healing. If you need help quitting, ask your health care provider.  · Keep all follow-up visits as told by your health care provider. This is important.  Contact a health care provider if:  · You develop a fever or chills.  · You have  redness, warmth, pain, or swelling that returns after treatment.  Get help right away if:  · You have rapid breathing or you have trouble breathing.  · You have chest pain.  · You cannot drink fluids or make urine.  · The affected area swells, changes color, or turns blue.  · You have numbness or severe pain in the affected area.  Summary  · Bone infections (osteomyelitis) occur when bacteria or other germs get inside a bone.  · You may be more likely to get this type of infection if you have a condition, such as diabetes, that lowers your ability to fight infection or increases your chances of getting an infection.  · Most bone infections are caused by bacteria. They can also be caused by other germs, such as viruses and funguses.  · Treatment for this condition usually starts with taking antibiotics. Further treatment depends on the cause and type of infection.  This information is not intended to replace advice given to you by your health care provider. Make sure you discuss any questions you have with your health care provider.  Document Released: 06/05/2005 Document Revised: 06/14/2017 Document Reviewed: 06/14/2017  Elsevier Interactive Patient Education © 2019 [employer] Inc.

## 2018-11-13 NOTE — Plan of Care (Signed)

## 2018-11-13 NOTE — TOC Transition Note (Addendum)
Transition of Care [facility] - CM/SW Discharge Note   Patient Details  Name: Randy Chen MRN: [id number] Date of Birth: 1928/05/19  [facility] CM/SW Contact:  Marilu Favre, RN Phone Number: 11/13/2018, 9:58 AM   Clinical Narrative:     Confirmed with patient's nurse Rocephin due this evening at 9 pm and Ampicillin due this evening at 5 pm.   Sherron Monday with [facility] , she will contact Emmit Pomfret with [employer] , once both agencies ready patient can be discharged.  [place] and [place] both ready for patient to be discharged.   Patient and bedside nurse both aware.  Final next level of care: [place] Barriers to Discharge: No Barriers Identified   Patient Goals and CMS Choice Patient states their goals for this hospitalization and ongoing recovery are:: to return to independent living  CMS Medicare.gov Compare Post Acute Care list provided to:: Patient Choice offered to / list presented to : Patient  Discharge Placement                       Discharge Plan and Services   Discharge Planning Services: CM Consult Post Acute Care Choice: Home Health          DME Arranged: N/A DME Agency: NA       HH Arranged: PT, OT, RN [place] Agency: [place] Date [place]: 11/12/18 [place]: 1034 Representative spoke with at [place]: [place] ([place]) Interventions     Readmission Risk Interventions Readmission Risk Prevention Plan 09/28/2018  Transportation Screening Complete  PCP or Specialist Appt within 5-7 Days Complete  Home Care Screening Complete  Medication Review (RN CM) Complete  Some recent data might be hidden

## 2018-11-13 NOTE — Discharge Summary (Addendum)
Discharge Summary  Randy Chen HYQ:[id number] DOB: March 27, 1928  PCP: [name], MD  Admit date: 11/09/2018 Discharge date: 11/13/2018  Time spent: 35 minutes  Recommendations for Outpatient Follow-up:  1. Follow-up with infectious disease 2. Follow-up with your primary care provider 3. Take your medications as prescribed 4. Continue physical therapy 5. Fall precautions  Discharge Diagnoses:  Active Hospital Problems   Diagnosis Date Noted   Osteomyelitis ([place]) 11/09/2018   Discitis 11/09/2018   Bacteremia due to Enterococcus    Chronic anemia 09/25/2018   Pulmonary nodule 09/25/2018    Resolved Hospital Problems  No resolved problems to display.    Discharge Condition: Stable  Diet recommendation: Resume previous diet  Vitals:   11/13/18 0436 11/13/18 0809  BP: (!) 147/62 (!) 176/92  Pulse: 84 77  Resp: 18 17  Temp: 98 F (36.7 C) 98.2 F (36.8 C)  SpO2: 97% 99%    History of present illness:   85 y.o.malewith medical history significant ofenterococcal bacteremia with concern for discitis and/or possible prosthetic valve endocarditis, currently on antibiotics through a PICC line at home (Rocephin and ampicillin),CAD status post CABG, prostate cancer,prostatectomy, hypertension, hyperlipidemia, aortic stenosis status post bioprosthetic aortic valve replacement, PAD, and conditions listed below presented to [facility] ED for evaluation of lower abdominal pain. Patient states for the past 3 days he is having intermittent severepain in his lower abdomen and lower back. States the pain is worse whenever he tries to move. Denies any fevers or chills at home. Denies any nausea, vomiting, diarrhea, or dysuria. States his last bowel movement was yesterday. Denies any melena or hematochezia. Denies saddle anesthesia, lower extremity weakness or numbness, or fecal/urinary incontinence. States he has been getting antibiotics through his PICC  line at home.  Infectious disease consulted and followed with recommendations to extend IV antibiotics for additional 4 weeks.  Total duration of antibiotics 10 weeks, end date 12/05/2018.  New PICC line placed on 11/12/2018.  Repeat blood cultures x2 done on 11/09/2018, negative to date.  11/13/18: Patient seen and examined at his bedside.  No acute events overnight.  States he feels well and ready to go home.  Reports minimal pain in his lower back.  Wants to go home and continue physical therapy at home.    Hospital Course:  Principal Problem:   Osteomyelitis [facility]) Active Problems:   Chronic anemia   Pulmonary nodule   Bacteremia due to Enterococcus   Discitis  L2-L3 discitis/osteomyelitis -suspect secondary to enterococcal bacteremia -Patient has a recent history ofenterococcal bacteremia with concern for discitis and/or possible prosthetic valve endocarditis from an admission from April 8 to April 15 and discharged on a 6-week course of Rocephin and ampicillin via PICC line. -CT abdomen pelvis showing new erosion and sclerosis of the L2-L3 disc space and adjacent endplates with mild adjacent perivertebral fat stranding, consistent with discitis osteomyelitis. -Biopsy obtained 5/24, results pending -MRI lumbar spine with contrast personally reviewed, with findings of L2-3 discitis and osteomyelitis -Sed rate 59 CRP 2.2 on 11/13/18 -Blood culture x2 thus far neg -ID recommends continue ampicillin and rocephin pending finalization of cultures And to extend IV antibiotics an additional 4 weeks.  Duration antibiotics 10 weeks with end date 12/05/2018 per infectious disease -PICC line placed on 11/12/2018 -Follow-up with ID outpatient on 12/03/2018 as recommended by ID  Resolved AKI on CKD stage II Creatinine at baseline 1.01 with GFR greater than 60 Presented with creatinine of 1.32 and GFR 48 Creatinine on 11/12/2018 was  1.01 with GFR greater than 60  Continue to hold off nephrotoxins    Follow-up with your primary care provider  Chronic normocytic anemia -Hemoglobin stable at 8.5  -hemoglobin 7.7 on 11/10/2018, was in the 9-10 range a month ago.Patient denies any melena or hematochezia. -Follow-up with PCP  Pulmonary nodule -CTshowinglobulated 1.1 cm nodule of the right lower lobe, unchanged from recent prior examination and very slightly enlarged compared to remote prior examination dated 06/24/2012.Patient is a former smoker. -Patient will need a follow-up CT in 3 to 6 months to ensure stability.  Hypertension -Continue amlodipine, metoprolol as tolerated -BP currently stable  CAD status post CABG -Continue aspirin, statin, beta-blocker -Remains chest pain free at this time  Hyperlipidemia -Continue Lipitor -Presently stable   Code Status: DNR   Consultants:   IR  ID  Procedures:   Lumbar spine biopsy 5/24    Discharge Exam: BP (!) 176/92 (BP Location: Left Arm)    Pulse 77    Temp 98.2 F (36.8 C) (Oral)    Resp 17    Ht '5\' 7"'  (1.702 m)    Wt 83.5 kg    SpO2 99%    BMI 28.82 kg/m   General: 85 y.o. year-old male well developed well nourished in no acute distress.  Alert and oriented x3.  Cardiovascular: Regular rate and rhythm with no rubs or gallops.  No thyromegaly or JVD noted.    Respiratory: Clear to auscultation with no wheezes or rales. Good inspiratory effort.  Abdomen: Soft nontender nondistended with normal bowel sounds x4 quadrants.  Musculoskeletal: No lower extremity edema. 2/4 pulses in all 4 extremities.  Psychiatry: Mood is appropriate for condition and setting  Discharge Instructions You were cared for by a hospitalist during your hospital stay. If you have any questions about your discharge medications or the care you received while you were in the hospital after you are discharged, you can call the unit and asked to speak with the hospitalist on call if the hospitalist that took care of you is not  available. Once you are discharged, your primary care physician will handle any further medical issues. Please note that NO REFILLS for any discharge medications will be authorized once you are discharged, as it is imperative that you return to your primary care physician (or establish a relationship with a primary care physician if you do not have one) for your aftercare needs so that they can reassess your need for medications and monitor your lab values.  Discharge Instructions    Home infusion instructions [facility] May follow [facility] Dosing Protocol; May administer Cathflo as needed to maintain patency of vascular access device.; Flushing of vascular access device: per [facility] Protocol: 0.9% NaCl pre/post medica...   Complete by:  As directed    Instructions:  May follow [place] Dosing Protocol   Instructions:  May administer Cathflo as needed to maintain patency of vascular access device.   Instructions:  Flushing of vascular access device: per [facility] Protocol: 0.9% NaCl pre/post medication administration and prn patency; Heparin 100 u/ml, 26m for implanted ports and Heparin 10u/ml, 590mfor all other central venous catheters.   Instructions:  May follow [facility] Anaphylaxis Protocol for First Dose Administration in the home: 0.9% NaCl at 25-50 ml/hr to maintain IV access for protocol meds. Epinephrine 0.3 ml IV/IM PRN and Benadryl 25-50 IV/IM PRN s/s of anaphylaxis.   Instructions:  [facility] Coordinator (RN) to assist per patient IV care needs in  the home PRN.     Allergies as of 11/13/2018   No Known Allergies     Medication List    TAKE these medications   amLODipine 10 MG tablet Commonly known as:  NORVASC Take 1 tablet (10 mg total) by mouth daily. What changed:    medication strength  how much to take   ampicillin  IVPB Inject 2 g into the vein every 8 (eight) hours for 69 doses. Indication:  Discitis Last Day of Therapy:  12/05/2018 Labs - Once  weekly:  CBC/D and BMP, Labs - Every other week:  ESR and CRP What changed:  additional instructions   aspirin EC 81 MG tablet Take 81 mg by mouth daily.   atorvastatin 40 MG tablet Commonly known as:  LIPITOR Take 40 mg by mouth daily.   CALCIUM 1000 + D PO Take 1,000 mg by mouth daily.   cefTRIAXone  IVPB Commonly known as:  ROCEPHIN Inject 2 g into the vein every 12 (twelve) hours for 46 doses. Indication:  Discitis Last Day of Therapy:  12/05/2018 Labs - Once weekly:  CBC/D and BMP, Labs - Every other week:  ESR and CRP What changed:  additional instructions   cyclobenzaprine 5 MG tablet Commonly known as:  FLEXERIL Take 1 tablet (5 mg total) by mouth 2 (two) times daily.   folic acid 976 MCG tablet Commonly known as:  FOLVITE Take 400 mcg by mouth daily.   GLUCOSAMINE PO Take 1 tablet by mouth daily.   HYDROcodone-acetaminophen 5-325 MG tablet Commonly known as:  NORCO/VICODIN Take 1-2 tablets by mouth every 4 (four) hours as needed for moderate pain.   metoprolol succinate 50 MG 24 hr tablet Commonly known as:  TOPROL-XL TAKE 1 TABLET BY MOUTH DAILY. DUE FOR FOLLOW UP APPOINTMENT. KEEP OFFICE VISIT What changed:  See the new instructions.   multivitamin with minerals Tabs tablet Take 1 tablet by mouth daily. Theragran   timolol 0.5 % ophthalmic solution Commonly known as:  TIMOPTIC Place 1 drop into the right eye 2 (two) times daily.   vitamin C 500 MG tablet Commonly known as:  ASCORBIC ACID Take 500 mg by mouth daily.   Vitamin D 50 MCG (2000 UT) Caps Take 2,000 Units by mouth daily.            Home Infusion Instuctions  (From admission, onward)         Start     Ordered   11/13/18 0000  Home infusion instructions [facility] May follow [place] Dosing Protocol; May administer Cathflo as needed to maintain patency of vascular access device.; Flushing of vascular access device: per [facility] Protocol: 0.9% NaCl pre/post medica...      Question Answer Comment  Instructions May follow [place] Dosing Protocol   Instructions May administer Cathflo as needed to maintain patency of vascular access device.   Instructions Flushing of vascular access device: per [facility] Protocol: 0.9% NaCl pre/post medication administration and prn patency; Heparin 100 u/ml, 76m for implanted ports and Heparin 10u/ml, 549mfor all other central venous catheters.   Instructions May follow [facility] Anaphylaxis Protocol for First Dose Administration in the home: 0.9% NaCl at 25-50 ml/hr to maintain IV access for protocol meds. Epinephrine 0.3 ml IV/IM PRN and Benadryl 25-50 IV/IM PRN s/s of anaphylaxis.   Instructions [facility] Infusion Coordinator (RN) to assist per patient IV care needs in the home PRN.      11/13/18 [id number]  No Known Allergies Follow-up Information    [facility] Follow up.        Care, [facility] Follow up.   Specialty:  Home Health Services Why:  [place] information: [place] [id number] [phone]        Tommy Medal, [name], MD Follow up.   Specialty:  Infectious Diseases Why:  12/03/18 @ 11am  Contact information: [address] [place] [address] [phone]        [name], MD. Call in 1 day(s).   Specialty:  Family Medicine Why:  Please call for a post hospital follow-up appointment. Contact information: [place] [address] [phone]            The results of significant diagnostics from this hospitalization (including imaging, microbiology, ancillary and laboratory) are listed below for reference.    Significant Diagnostic Studies: Mr Lumbar Spine W Wo Contrast  Result Date: 11/10/2018 CLINICAL DATA:  85 y/o M; history of Enterococcus bacteremia and concern for discitis and/or possible prosthetic valve endocarditis. Severe pain of the lower abdomen and lower back. EXAM: MRI LUMBAR SPINE WITHOUT  AND WITH CONTRAST TECHNIQUE: Multiplanar and multiecho pulse sequences of the lumbar spine were obtained without and with intravenous contrast. CONTRAST:  6 cc Gadavist COMPARISON:  11/09/2018 CT the abdomen and pelvis. 09/26/2018 lumbar spine MRI. FINDINGS: Segmentation:  Standard. Alignment:  Physiologic. Vertebrae: L2-3 diffuse vertebral body enhancement, rim enhancing collection within the intervertebral disc space, and opposing endplate erosive changes compatible with acute discitis osteomyelitis. Mild anterior loss of the L2 vertebral body height. The disc abscess extends into the right psoas muscle with there is an 11 mm collection (series 11, image 16). There is anterior epidural thickening and enhancement extending from L2-L4. There is decreased T1 signal and enhancement within the dorsal aspect of the L4 vertebral body likely representing developing osteomyelitis from contiguous spread. Conus medullaris and cauda equina: Conus extends to the L1 level. Conus and cauda equina appear normal. Paraspinal and other soft tissues: Extensive paravertebral edema at the level of discitis osteomyelitis. Diffuse enhancement of the right greater than left psoas muscles as well as the left-greater-than-right paraspinal muscles compatible with myositis. Disc levels: Stable multilevel disc and facet degenerative changes greatest at the L4-5 level with there is moderate multifactorial spinal canal stenosis and at the L5-S1 level with there is moderate bilateral neural foraminal stenosis. Increases spinal canal stenosis at the L2-3 and L3-4 levels. Spinal canal stenosis is mild L2-3 and moderate L3-4. IMPRESSION: 1. L2-3 acute discitis osteomyelitis. Erosive changes of opposing endplates and mild anterior loss of height of L2 vertebral body. 2. Anterior epidural inflammation from L2-L4 with soft tissue thickening, no discrete abscess at this time. Enhancement within dorsal L4 vertebral body likely represents developing  osteomyelitis from epidural spread. 3. Small right psoas muscle abscess. Extensive inflammation of bilateral psoas muscles and lumbar paraspinal muscles likely representing a combination of myositis and muscle strain. 4. Mild L2-3 and moderate L3-4 spinal canal stenosis due to thickening of the anterior epidural soft tissues combined with lumbar spondylosis. 5. Stable background of lumbar spondylosis greatest at the L4-5 and L5-S1 levels. Electronically Signed   By: Kristine Garbe M.D.   On: 11/10/2018 01:30   Ct Abdomen Pelvis W Contrast  Result Date: 11/09/2018 CLINICAL DATA:  Pelvic pain, low back pain EXAM: CT ABDOMEN AND PELVIS WITH CONTRAST TECHNIQUE: Multidetector CT imaging of the abdomen and pelvis was  performed using the standard protocol following bolus administration of intravenous contrast. CONTRAST:  40m OMNIPAQUE IOHEXOL 300 MG/ML  SOLN COMPARISON:  09/25/2018, 06/24/2012 FINDINGS: Lower chest: No acute abnormality. Redemonstrated lobulated 1.1 cm nodule of the right lower lobe, unchanged from recent prior examination and very slightly enlarged compared to remote prior examination dated 06/24/2012. Extensive 3 vessel coronary artery calcifications and/or stents, status post aortic valve replacement. Mild bibasilar fibrotic change. Hepatobiliary: No focal liver abnormality is seen. The gallbladder is distended with sludge. No gallbladder wall thickening, or biliary dilatation. Pancreas: Unremarkable. No pancreatic ductal dilatation or surrounding inflammatory changes. Spleen: Normal in size without focal abnormality. Adrenals/Urinary Tract: Adrenal glands are unremarkable. Kidneys are normal, without renal calculi, focal lesion, or hydronephrosis. Bladder is unremarkable. Stomach/Bowel: Stomach is within normal limits. Appendix appears normal. No evidence of bowel wall thickening, distention, or inflammatory changes. Moderate burden of stool in the left and right colon. Sigmoid  diverticulosis. Vascular/Lymphatic: Severe calcific atherosclerosis. No enlarged abdominal or pelvic lymph nodes. Reproductive: Status post prostatectomy. Other: No abdominal wall hernia or abnormality. No abdominopelvic ascites. Musculoskeletal: There is new erosion and sclerosis of the L2-L3 disc space and adjacent endplates with mild adjacent perivertebral fat stranding (series 6, image 61, series 7, image 23). IMPRESSION: 1. There is new erosion and sclerosis of the L2-L3 disc space and adjacent endplates with mild adjacent perivertebral fat stranding (series 6, image 61, series 7, image 23), consistent with discitis osteomyelitis. Consider contrast enhanced MRI to more sensitively evaluate for disc space and epidural abscess. 2. Redemonstrated lobulated 1.1 cm nodule of the right lower lobe, unchanged from recent prior examination and very slightly enlarged compared to remote prior examination dated 06/24/2012. As on prior examination, if clinically appropriate consider follow-up CT in 3-6 months to ensure stability. 3. Other chronic, incidental, and postoperative findings as detailed above. Electronically Signed   By: AEddie CandleM.D.   On: 11/09/2018 14:48   Dg Chest Port 1 View  Result Date: 11/09/2018 CLINICAL DATA:  Abdominal pain with concern for sepsis EXAM: PORTABLE CHEST 1 VIEW COMPARISON:  September 12, 2004 FINDINGS: There is a small calcified granuloma in the left base. There is no edema or consolidation. Heart size and pulmonary vascularity are normal. Patient is status post coronary artery bypass grafting. There is aortic atherosclerosis. No adenopathy. There old healed rib fractures on the right. IMPRESSION: Small calcified granuloma left lower lobe. No edema or consolidation. Heart size within normal limits. Postoperative changes noted. Aortic Atherosclerosis (ICD10-I70.0). Electronically Signed   By: WLowella GripIII M.D.   On: 11/09/2018 12:44   [place]kg Site Rite  Result Date:  11/12/2018 If Site Rite image not attached, placement could not be confirmed due to current cardiac rhythm.  [place]kg Site Rite  Result Date: 11/12/2018 If SS[address] LLCimage not attached, placement could not be confirmed due to current cardiac rhythm.  Ir Lumbar Disc Aspiration W/img Guide  Result Date: 11/10/2018 INDICATION: Suspected discitis/osteomyelitis lumbar spine centered at L2-3. EXAM: LUMBAR L2-3 DISC ASPIRATION UNDER FLUOROSCOPY MEDICATIONS: Lidocaine 1% subcutaneous ANESTHESIA/SEDATION: Intravenous Fentanyl 751m and Versed 1.51m48mere administered as conscious sedation during continuous monitoring of the patient's level of consciousness and physiological / cardiorespiratory status by the radiology RN, with a total moderate sedation time of 10 minutes. PROCEDURE: Informed written consent was obtained from the patient after a thorough discussion of the procedural risks, benefits and alternatives. All questions were addressed. Maximal Sterile Barrier Technique was utilized including caps, mask, sterile gowns, sterile  gloves, sterile drape, hand hygiene and skin antiseptic. A timeout was performed prior to the initiation of the procedure. The appropriate interspace was identified under fluoroscopy, corresponding to previous cross-sectional imaging. An appropriate skin entry site was determined. After local infiltration with 1% lidocaine, a 17 gauge trocar needle was advanced into the interspace from right posterolateral extraforaminal approach. Needle tip position within the interspace confirmed on biplane images. 2 mL thin blood-tinged fluid were aspirated, sent for the requested laboratory studies. The patient tolerated the procedure well. FLUOROSCOPY TIME:  42 seconds; 42 mGy COMPLICATIONS: None immediate. IMPRESSION: Technically successful lumbar L2-3 disc aspiration under fluoroscopy. Electronically Signed   By: Lucrezia Europe M.D.   On: 11/10/2018 12:59    Microbiology: Recent Results (from the  past 240 hour(s))  Blood Culture (routine x 2)     Status: None (Preliminary result)   Collection Time: 11/09/18 12:30 PM  Result Value Ref Range Status   Specimen Description   Final    BLOOD RIGHT ARM Performed at [facility], [address] [place], [place] [address]    Special Requests   Final    BOTTLES DRAWN AEROBIC AND ANAEROBIC Blood Culture adequate volume Performed at [facility], [address] [place], [place] [address]    Culture   Final    NO GROWTH 3 DAYS Performed at [facility], [place] [address], [place], [place] [address]    Report Status PENDING  Incomplete  Blood Culture (routine x 2)     Status: None (Preliminary result)   Collection Time: 11/09/18 12:45 PM  Result Value Ref Range Status   Specimen Description   Final    BLOOD LEFT ARM Performed at [facility], [address] [place], [place] [address]    Special Requests   Final    BOTTLES DRAWN AEROBIC AND ANAEROBIC Blood Culture adequate volume Performed at [facility], [address] [place], [place] [address]    Culture   Final    NO GROWTH 3 DAYS Performed at [facility], [place] [address], [place], [place] [address]    Report Status PENDING  Incomplete  SARS Coronavirus 2 (Hosp order,Performed in [facility] lab via [employer] ID)     Status: None   Collection Time: 11/09/18 12:49 PM  Result Value Ref Range Status   SARS Coronavirus 2 ([employer] Now) NEGATIVE NEGATIVE Final    Comment: (NOTE) Interpretive Result Comment(s): COVID 19 Positive SARS CoV 2 target nucleic acids are DETECTED. The SARS CoV 2 RNA is generally detectable in upper and lower respiratory specimens during the acute phase of infection.  Positive results are indicative of active infection with SARS CoV 2.  Clinical correlation with patient history and other diagnostic information is necessary to determine patient infection status.  Positive results do not rule out  bacterial infection or coinfection with other viruses. The expected result is Negative. COVID 19 Negative SARS CoV 2 target nucleic acids are NOT DETECTED. The SARS CoV 2 RNA is generally detectable in upper and lower respiratory specimens during the acute phase of infection.  Negative results do not preclude SARS CoV 2 infection, do not rule out coinfections with other pathogens, and should not be used as the sole basis for treatment or other patient management decisions.  Negative results must be combined with clinical  observations, patient history, and epidemiological information. The expected result is Negative. Invalid Presence or absence  of SARS CoV 2 nucleic acids cannot be determined. Repeat testing was performed on the submitted specimen and repeated Invalid results were obtained.  If clinically indicated, additional testing on a new specimen with an alternate test methodology [phone]) is advised.  The SARS CoV 2 RNA is generally detectable in upper and lower respiratory specimens during the acute phase of infection. The expected result is Negative. Fact Sheet for Patients:  GolfingFamily.no Fact Sheet for Healthcare Providers: https://www.hernandez-brewer.com/ This test is not yet approved or cleared by the [place] FDA and has been authorized for detection and/or diagnosis of SARS CoV 2 by FDA under an Emergency Use Authorization (EUA).  This EUA will remain in effect (meaning this test can be used) for the duration of the COVID19 d eclaration under Section 564(b)(1) of the Act, 21 U.S.C. section [phone] 3(b)(1), unless the authorization is terminated or revoked sooner. Performed at [facility], [address] [place], [place] [address]   Urine culture     Status: None   Collection Time: 11/09/18  2:42 PM  Result Value Ref Range Status   Specimen Description   Final    URINE, RANDOM Performed at [facility], [address] [place], [place] [address]    Special Requests   Final    NONE Performed at [facility], [address] [place], [place] [address]    Culture   Final    NO GROWTH Performed at [facility], [place] [address], [place], [place] [address]    Report Status 11/11/2018 FINAL  Final  Surgical PCR screen     Status: None   Collection Time: 11/10/18  5:58 AM  Result Value Ref Range Status   MRSA, PCR NEGATIVE NEGATIVE Final   Staphylococcus aureus NEGATIVE NEGATIVE Final    Comment: (NOTE) The Xpert SA Assay (FDA approved for NASAL specimens in patients 44 years of age and older), is one component of a comprehensive surveillance program. It is not intended to diagnose infection nor to guide or monitor treatment. Performed at [facility], [place] [address], [place], [place] [address]   Aerobic/Anaerobic Culture (surgical/deep wound)     Status: None (Preliminary result)   Collection Time: 11/10/18 12:40 PM  Result Value Ref Range Status   Specimen Description NEEDLE ASPIRATE DISC  Final   Special Requests NONE  Final   Gram Stain   Final    RARE WBC PRESENT, PREDOMINANTLY PMN NO ORGANISMS SEEN    Culture   Final    NO GROWTH 3 DAYS NO ANAEROBES ISOLATED; CULTURE IN PROGRESS FOR 5 DAYS Performed at [facility], [place] [address] [place],  [address]    Report Status PENDING  Incomplete     Labs: Basic Metabolic Panel: Recent Labs  Lab 11/09/18 1233 11/10/18 0341 11/11/18 0415 11/12/18 0313  NA 134* 136 138 136  K 3.6 3.3* 3.6 3.8  CL 99 103 103 100  CO2 '24 24 26 26  ' GLUCOSE 122* 89 104* 115*  BUN '18 13 14 13  ' CREATININE 1.32* 1.16 1.11 1.01  CALCIUM 8.3* 8.0* 8.3* 8.4*   Liver Function Tests: Recent Labs  Lab 11/09/18 1233 11/11/18 0415  AST 25 27  ALT 18 17  ALKPHOS 94 98  BILITOT 0.4 0.4  PROT 6.3* 5.3*  ALBUMIN 3.0* 2.3*   No results for input(s): LIPASE, AMYLASE in the last 168 hours. No results for  input(s): AMMONIA  in the last 168 hours. CBC: Recent Labs  Lab 11/09/18 1233 11/10/18 0341 11/11/18 0415 11/12/18 0313  WBC 7.9 6.5 6.9 8.1  NEUTROABS 5.9  --   --   --   HGB 8.6* 7.7* 8.3* 8.5*  HCT 27.5* 24.1* 25.9* 26.7*  MCV 89.6 87.6 87.8 87.5  PLT 299 245 238 256   Cardiac Enzymes: No results for input(s): CKTOTAL, CKMB, CKMBINDEX, TROPONINI in the last 168 hours. BNP: BNP (last 3 results) No results for input(s): BNP in the last 8760 hours.  ProBNP (last 3 results) No results for input(s): PROBNP in the last 8760 hours.  CBG: No results for input(s): GLUCAP in the last 168 hours.     Signed:  Kayleen Memos, MD [facility] 11/13/2018, 9:26 AM

## 2018-11-13 NOTE — Progress Notes (Signed)
Physical Therapy Treatment Patient Details Name: Randy Chen MRN: [id number] DOB: 04-09-1928 Today's Date: 11/13/2018    History of Present Illness Patient is a 85 y/o male who presents with fever and abdominal pain- has hx of enterococcal bacteremia with concern for discitis and/or possible prosthetic valve endocarditis, currently on antibiotics through a PICC line at home. Chest abdomen-new erosion and sclerosis of the L2-L3 disc space and adjacent endplates with mild adjacent perivertebral fat stranding, consistent with discitis osteomyelitis. PMH includes aortic stenosis s/p CABG, prostate ca, HTN.   PT Comments    Pt progressing with mobility. Ambulatory with RW at supervision-level; able to ambulate without DME, min guard for balance as pt reaching to furniture for added stability. Discussed recommendation for RW use upon return home to reduce fall risk; pt in agreement. Pt preparing for d/c this afternoon. If to remain admitted, will continue to follow acutely.    Follow Up Recommendations  Home health PT;Supervision for mobility/OOB(return to ILF)     Equipment Recommendations  None recommended by PT    Recommendations for Other Services       Precautions / Restrictions Precautions Precautions: Fall Restrictions Weight Bearing Restrictions: No    Mobility  Bed Mobility Overal bed mobility: Needs Assistance Bed Mobility: Supine to Sit Rolling: Min assist   Supine to sit: Min assist     General bed mobility comments: MinA for HHA and momentum for trunk elevation; pt could likely complete independently  Transfers Overall transfer level: Needs assistance Equipment used: Rolling walker (2 wheeled) Transfers: Sit to/from Stand Sit to Stand: Supervision            Ambulation/Gait Ambulation/Gait assistance: Supervision Gait Distance (Feet): 500 Feet Assistive device: Rolling walker (2 wheeled) Gait Pattern/deviations: Step-through pattern;Trunk  flexed;Decreased stride length   Gait velocity interpretation: 1.31 - 2.62 ft/sec, indicative of limited community ambulator General Gait Details: Slow, steady gait with RW, maintaining good pace; forward flexed posture. Supervision for safety   Stairs             Wheelchair Mobility    Modified Rankin (Stroke Patients Only)       Balance Overall balance assessment: Needs assistance   Sitting balance-Leahy Scale: Good       Standing balance-Leahy Scale: Fair Standing balance comment: Can static stand without UE support to comb hair; can take steps with DME, reaching to furniture/wall for stability                            Cognition Arousal/Alertness: Awake/alert Behavior During Therapy: WFL for tasks assessed/performed Overall Cognitive Status: Within Functional Limits for tasks assessed                                        Exercises      General Comments General comments (skin integrity, edema, etc.): Preparing for d/c to ILF this afternoon; will continue to work with HHPT      Pertinent Vitals/Pain Pain Assessment: Faces Faces Pain Scale: Hurts a little bit Pain Location: Legs Pain Descriptors / Indicators: Sore Pain Intervention(s): Monitored during session    Home Living                      Prior Function            PT Goals (current goals can now be  found in the care plan section) Acute Rehab PT Goals Patient Stated Goal: Return home to see wife today PT Goal Formulation: With patient Time For Goal Achievement: 11/24/18 Potential to Achieve Goals: Good Progress towards PT goals: Progressing toward goals    Frequency    Min 3X/week      PT Plan Current plan remains appropriate    Co-evaluation              AM-PAC PT "6 Clicks" Mobility   Outcome Measure  Help needed turning from your back to your side while in a flat bed without using bedrails?: A Little Help needed moving from lying  on your back to sitting on the side of a flat bed without using bedrails?: A Little   Help needed standing up from a chair using your arms (e.g., wheelchair or bedside chair)?: A Little Help needed to walk in hospital room?: A Little Help needed climbing 3-5 steps with a railing? : A Little 6 Click Score: 15    End of Session Equipment Utilized During Treatment: Gait belt Activity Tolerance: Patient tolerated treatment well Patient left: in chair;with call bell/phone within reach Nurse Communication: Mobility status PT Visit Diagnosis: Difficulty in walking, not elsewhere classified (R26.2) Pain - part of body: Leg     Time: 7564-3329 PT Time Calculation (min) (ACUTE ONLY): 13 min  Charges:  $Gait Training: 8-22 mins                    Mabeline Caras, PT, DPT Acute Rehabilitation Services  Pager [phone] Office [place] 11/13/2018, 2:18 PM

## 2018-11-14 ENCOUNTER — Observation Stay (HOSPITAL_COMMUNITY)
Admission: EM | Admit: 2018-11-14 | Discharge: 2018-11-15 | Disposition: A | Discharge status: Home / Self Care | Payer: Medicare Other | Attending: Internal Medicine | Admitting: Internal Medicine

## 2018-11-14 ENCOUNTER — Emergency Department (HOSPITAL_COMMUNITY): Payer: Medicare Other

## 2018-11-14 ENCOUNTER — Inpatient Hospital Stay (HOSPITAL_COMMUNITY): Payer: Medicare Other

## 2018-11-14 ENCOUNTER — Other Ambulatory Visit: Payer: Self-pay

## 2018-11-14 ENCOUNTER — Telehealth: Payer: Self-pay | Admitting: *Deleted

## 2018-11-14 ENCOUNTER — Encounter (HOSPITAL_COMMUNITY): Payer: Self-pay

## 2018-11-14 DIAGNOSIS — D509 Iron deficiency anemia, unspecified: Secondary | ICD-10-CM | POA: Diagnosis not present

## 2018-11-14 DIAGNOSIS — I639 Cerebral infarction, unspecified: Secondary | ICD-10-CM | POA: Diagnosis not present

## 2018-11-14 DIAGNOSIS — Z79899 Other long term (current) drug therapy: Secondary | ICD-10-CM | POA: Diagnosis not present

## 2018-11-14 DIAGNOSIS — I10 Essential (primary) hypertension: Secondary | ICD-10-CM | POA: Diagnosis present

## 2018-11-14 DIAGNOSIS — I2510 Atherosclerotic heart disease of native coronary artery without angina pectoris: Secondary | ICD-10-CM | POA: Diagnosis not present

## 2018-11-14 DIAGNOSIS — Z87891 Personal history of nicotine dependence: Secondary | ICD-10-CM | POA: Insufficient documentation

## 2018-11-14 DIAGNOSIS — E875 Hyperkalemia: Secondary | ICD-10-CM | POA: Diagnosis not present

## 2018-11-14 DIAGNOSIS — R262 Difficulty in walking, not elsewhere classified: Secondary | ICD-10-CM | POA: Diagnosis not present

## 2018-11-14 DIAGNOSIS — G459 Transient cerebral ischemic attack, unspecified: Principal | ICD-10-CM | POA: Insufficient documentation

## 2018-11-14 DIAGNOSIS — Z03818 Encounter for observation for suspected exposure to other biological agents ruled out: Secondary | ICD-10-CM | POA: Diagnosis not present

## 2018-11-14 DIAGNOSIS — D649 Anemia, unspecified: Secondary | ICD-10-CM

## 2018-11-14 DIAGNOSIS — N183 Chronic kidney disease, stage 3 unspecified: Secondary | ICD-10-CM

## 2018-11-14 DIAGNOSIS — R4701 Aphasia: Secondary | ICD-10-CM

## 2018-11-14 DIAGNOSIS — Z951 Presence of aortocoronary bypass graft: Secondary | ICD-10-CM | POA: Insufficient documentation

## 2018-11-14 DIAGNOSIS — I129 Hypertensive chronic kidney disease with stage 1 through stage 4 chronic kidney disease, or unspecified chronic kidney disease: Secondary | ICD-10-CM | POA: Insufficient documentation

## 2018-11-14 DIAGNOSIS — I6389 Other cerebral infarction: Secondary | ICD-10-CM | POA: Diagnosis not present

## 2018-11-14 DIAGNOSIS — R7881 Bacteremia: Secondary | ICD-10-CM | POA: Diagnosis not present

## 2018-11-14 DIAGNOSIS — Z1159 Encounter for screening for other viral diseases: Secondary | ICD-10-CM | POA: Insufficient documentation

## 2018-11-14 DIAGNOSIS — R471 Dysarthria and anarthria: Secondary | ICD-10-CM | POA: Diagnosis not present

## 2018-11-14 DIAGNOSIS — Z7982 Long term (current) use of aspirin: Secondary | ICD-10-CM | POA: Diagnosis not present

## 2018-11-14 DIAGNOSIS — B952 Enterococcus as the cause of diseases classified elsewhere: Secondary | ICD-10-CM

## 2018-11-14 LAB — BASIC METABOLIC PANEL
Anion gap: 12 (ref 5–15)
BUN: 17 mg/dL (ref 8–23)
CO2: 26 mmol/L (ref 22–32)
Calcium: 8.9 mg/dL (ref 8.9–10.3)
Chloride: 99 mmol/L (ref 98–111)
Creatinine, Ser: 1.45 mg/dL — ABNORMAL HIGH (ref 0.61–1.24)
GFR calc Af Amer: 49 mL/min — ABNORMAL LOW (ref >60)
GFR calc non Af Amer: 43 mL/min — ABNORMAL LOW (ref >60)
Glucose, Bld: 117 mg/dL — ABNORMAL HIGH (ref 70–99)
Potassium: 3.8 mmol/L (ref 3.5–5.1)
Sodium: 137 mmol/L (ref 135–145)

## 2018-11-14 LAB — CBC WITH DIFFERENTIAL/PLATELET
Abs Immature Granulocytes: 0.04 10*3/uL (ref 0.00–0.07)
Basophils Absolute: 0 10*3/uL (ref 0.0–0.1)
Basophils Relative: 0 %
Eosinophils Absolute: 0 10*3/uL (ref 0.0–0.5)
Eosinophils Relative: 0 %
HCT: 27.9 % — ABNORMAL LOW (ref 39.0–52.0)
Hemoglobin: 8.6 g/dL — ABNORMAL LOW (ref 13.0–17.0)
Immature Granulocytes: 0 %
Lymphocytes Relative: 12 %
Lymphs Abs: 1.4 10*3/uL (ref 0.7–4.0)
MCH: 27.5 pg (ref 26.0–34.0)
MCHC: 30.8 g/dL (ref 30.0–36.0)
MCV: 89.1 fL (ref 80.0–100.0)
Monocytes Absolute: 0.8 10*3/uL (ref 0.1–1.0)
Monocytes Relative: 7 %
Neutro Abs: 9.3 10*3/uL — ABNORMAL HIGH (ref 1.7–7.7)
Neutrophils Relative %: 81 %
Platelets: 283 10*3/uL (ref 150–400)
RBC: 3.13 MIL/uL — ABNORMAL LOW (ref 4.22–5.81)
RDW: 14.2 % (ref 11.5–15.5)
WBC: 11.6 10*3/uL — ABNORMAL HIGH (ref 4.0–10.5)
nRBC: 0 % (ref 0.0–0.2)

## 2018-11-14 LAB — SARS CORONAVIRUS 2 BY RT PCR (HOSPITAL ORDER, PERFORMED IN ~~LOC~~ HOSPITAL LAB): SARS Coronavirus 2: NEGATIVE

## 2018-11-14 LAB — CULTURE, BLOOD (ROUTINE X 2)
Culture: NO GROWTH
Culture: NO GROWTH
Special Requests: ADEQUATE
Special Requests: ADEQUATE

## 2018-11-14 LAB — PROTIME-INR
INR: 1.2 (ref 0.8–1.2)
Prothrombin Time: 14.6 seconds (ref 11.4–15.2)

## 2018-11-14 MED ORDER — CYCLOBENZAPRINE HCL 10 MG PO TABS
5.0000 mg | ORAL_TABLET | Freq: Two times a day (BID) | ORAL | Status: DC
  Administered 2018-11-14 – 2018-11-15 (×2): 5 mg via ORAL
  Filled 2018-11-14 (×2): qty 1

## 2018-11-14 MED ORDER — CALCIUM CARBONATE-VITAMIN D 500-200 MG-UNIT PO TABS
2.0000 | ORAL_TABLET | Freq: Every day | ORAL | Status: DC
  Administered 2018-11-14 – 2018-11-15 (×2): 2 via ORAL
  Filled 2018-11-14 (×2): qty 2

## 2018-11-14 MED ORDER — CALCIUM CARB-CHOLECALCIFEROL 1000-800 MG-UNIT PO TABS: ORAL_TABLET | Freq: Every day | ORAL | Status: DC

## 2018-11-14 MED ORDER — ENOXAPARIN SODIUM 30 MG/0.3ML ~~LOC~~ SOLN
30.0000 mg | Freq: Two times a day (BID) | SUBCUTANEOUS | Status: DC
  Administered 2018-11-14 – 2018-11-15 (×2): 30 mg via SUBCUTANEOUS
  Filled 2018-11-14 (×2): qty 0.3

## 2018-11-14 MED ORDER — VITAMIN C 500 MG PO TABS
500.0000 mg | ORAL_TABLET | Freq: Every day | ORAL | Status: DC
  Administered 2018-11-15: 08:00:00 500 mg via ORAL
  Filled 2018-11-14: qty 1

## 2018-11-14 MED ORDER — HYDROCODONE-ACETAMINOPHEN 5-325 MG PO TABS
1.0000 | ORAL_TABLET | ORAL | Status: DC | PRN
  Administered 2018-11-15: 01:00:00 2 via ORAL
  Filled 2018-11-14: qty 2

## 2018-11-14 MED ORDER — ACETAMINOPHEN 325 MG PO TABS: 650.0000 mg | ORAL_TABLET | ORAL | Status: DC | PRN

## 2018-11-14 MED ORDER — FOLIC ACID 400 MCG PO TABS
400.0000 ug | ORAL_TABLET | Freq: Every day | ORAL | Status: DC
  Administered 2018-11-14: 400 ug via ORAL

## 2018-11-14 MED ORDER — ACETAMINOPHEN 650 MG RE SUPP: 650.0000 mg | RECTAL | Status: DC | PRN

## 2018-11-14 MED ORDER — AMPICILLIN IV (FOR PTA / DISCHARGE USE ONLY): 2.0000 g | Freq: Three times a day (TID) | INTRAVENOUS | Status: DC

## 2018-11-14 MED ORDER — STROKE: EARLY STAGES OF RECOVERY BOOK
Freq: Once | Status: AC
  Administered 2018-11-14: 21:00:00
  Filled 2018-11-14: qty 1

## 2018-11-14 MED ORDER — SODIUM CHLORIDE 0.9 % IV SOLN
2.0000 g | Freq: Three times a day (TID) | INTRAVENOUS | Status: DC
  Administered 2018-11-14 – 2018-11-15 (×3): 2 g via INTRAVENOUS
  Filled 2018-11-14 (×5): qty 2000

## 2018-11-14 MED ORDER — SENNOSIDES-DOCUSATE SODIUM 8.6-50 MG PO TABS: 1.0000 | ORAL_TABLET | Freq: Every evening | ORAL | Status: DC | PRN

## 2018-11-14 MED ORDER — VITAMIN D 50 MCG (2000 UT) PO CAPS: 2000.0000 [IU] | ORAL_CAPSULE | Freq: Every day | ORAL | Status: DC

## 2018-11-14 MED ORDER — ADULT MULTIVITAMIN W/MINERALS CH
1.0000 | ORAL_TABLET | Freq: Every day | ORAL | Status: DC
  Administered 2018-11-15: 08:00:00 1 via ORAL
  Filled 2018-11-14: qty 1

## 2018-11-14 MED ORDER — ASPIRIN EC 81 MG PO TBEC
81.0000 mg | DELAYED_RELEASE_TABLET | Freq: Every day | ORAL | Status: DC
  Administered 2018-11-14 – 2018-11-15 (×2): 81 mg via ORAL
  Filled 2018-11-14 (×2): qty 1

## 2018-11-14 MED ORDER — ACETAMINOPHEN 160 MG/5ML PO SOLN: 650.0000 mg | ORAL | Status: DC | PRN

## 2018-11-14 MED ORDER — CEFTRIAXONE IV (FOR PTA / DISCHARGE USE ONLY): 2.0000 g | Freq: Two times a day (BID) | INTRAVENOUS | Status: DC

## 2018-11-14 MED ORDER — TIMOLOL MALEATE 0.5 % OP SOLN
1.0000 [drp] | Freq: Two times a day (BID) | OPHTHALMIC | Status: DC
  Administered 2018-11-14 – 2018-11-15 (×2): 1 [drp] via OPHTHALMIC
  Filled 2018-11-14: qty 5

## 2018-11-14 MED ORDER — VITAMIN D 25 MCG (1000 UNIT) PO TABS
2000.0000 [IU] | ORAL_TABLET | Freq: Every day | ORAL | Status: DC
  Administered 2018-11-14 – 2018-11-15 (×2): 2000 [IU] via ORAL
  Filled 2018-11-14 (×2): qty 2

## 2018-11-14 MED ORDER — ATORVASTATIN CALCIUM 40 MG PO TABS
40.0000 mg | ORAL_TABLET | Freq: Every day | ORAL | Status: DC
  Administered 2018-11-14 – 2018-11-15 (×2): 40 mg via ORAL
  Filled 2018-11-14 (×2): qty 1

## 2018-11-14 MED ORDER — SODIUM CHLORIDE 0.9 % IV SOLN
2.0000 g | Freq: Two times a day (BID) | INTRAVENOUS | Status: DC
  Administered 2018-11-14 – 2018-11-15 (×2): 2 g via INTRAVENOUS
  Filled 2018-11-14 (×3): qty 20

## 2018-11-14 MED ORDER — FOLIC ACID 1 MG PO TABS
0.5000 mg | ORAL_TABLET | Freq: Every day | ORAL | Status: DC
  Administered 2018-11-15: 08:00:00 0.5 mg via ORAL
  Filled 2018-11-14 (×2): qty 1

## 2018-11-14 MED ORDER — SODIUM CHLORIDE 0.9 % IV SOLN
INTRAVENOUS | Status: DC
  Administered 2018-11-14: 21:00:00 via INTRAVENOUS

## 2018-11-14 NOTE — Telephone Encounter (Signed)
Patient's wife called with concerns after hospital discharge yesterday morning. Randy Chen was in extreme pain about 8:30 this morning and was unable to speak or form understandable words for about 20 minutes this morning. He was discharged with out pain medication and his wife wanted to know what to do. RN asked more about the episode where he was unable to speak. His wife said the words did not sound like English and made no sense. He became frustrated and said clearly "I can't find the words" then returned to nonsense.  Per wife, his words returned slowly after about 20 minutes. His pain became less intense, too. His wife stated that their home health nurse was on the way. RN contacted home health nurse Randy Chen, apprised her of the situation, asked for a neuro assessment for stroke like symptoms. She called from the home, stated Randy Chen was still missing some words, stuttering which is unusual. She stated his face was symmetrical, he had equal strength grips, his pupils were equal.  She still feels he needs emergent evaluation. Wife in agreement, will bring him to the emergency room. Landis Gandy, RN

## 2018-11-14 NOTE — ED Notes (Signed)
Nurse navigator spoke with patient's daughter Chong Sicilian and patient. Updated patient and daughter plan of care. Patient also spoke with daughter and wife on phone.

## 2018-11-14 NOTE — ED Notes (Signed)
Attempted report and was advised the RN would call back in 5 minutes.

## 2018-11-14 NOTE — ED Notes (Signed)
Pt returned from MRI °

## 2018-11-14 NOTE — ED Notes (Signed)
Patient transported to MRI 

## 2018-11-14 NOTE — ED Provider Notes (Signed)
  Recently diagnosed w/ discitis, d/c'd on IV abx through PICC line. Yesterday developed worsening pain in his back. Took pain pill this morning, then from 9-12 today had episode of expressive aphasia, has improved but definitely different from baseline per wife. Per Dr. Estanislado Pandy plan, discussed w/ Dr. Cheral Marker, neurologist, and will obtain MRI. Code stroke not activated due to outside window and improving symptoms.  MRI shows small acute infarct in R parietal lobe. Discussed w/ [facility], Dr. Ree Kida, who will admit for stroke work up.    Carlous Olivares, Wenda Overland, MD 11/14/18 [phone]

## 2018-11-14 NOTE — ED Provider Notes (Signed)
[facility] EMERGENCY DEPARTMENT Provider Note   CSN: [id number] Arrival date & time: 11/14/18  1206    History   Chief Complaint Chief Complaint  Patient presents with  . Aphasia    HPI Randy Chen is a 85 y.o. male.     Patient is an 85 year old male with past medical history of coronary artery disease with CABG, hypertension, and recent diagnosis of lumbar discitis.  Patient was recently in the hospital for this.  He was discharged yesterday.  This morning the patient woke up with severe pain in his low back.  He reports taking a pain pill from an old prescription.  Shortly afterward his pain became much worse.  His wife states that he was leaning over the bed having difficulty speaking.  She describes that he attempted to speak, however "only gibberish" came out of his mouth.  Patient had these symptoms ongoing for the past several hours until he was brought here by his family.  Patient speech has improved significantly, however is not completely back to baseline.  The patient denies any weakness or numbness of the arms or legs.  He denies any headache or visual disturbances.  The history is provided by the patient.    Past Medical History:  Diagnosis Date  . Aortic stenosis   . Cancer ([place])   . Coronary artery disease    CABG x5 - 1987  . History of atherosclerotic cardiovascular disease   . History of prostatectomy   . Hyperlipidemia   . Hypertension   . PAD (peripheral artery disease) ([place]) 06/06/2017    Patient Active Problem List   Diagnosis Date Noted  . Discitis 11/09/2018  . Osteomyelitis ([place]) 11/09/2018  . Acute lower UTI 09/26/2018  . Bacteremia due to Enterococcus   . AKI (acute kidney injury) ([place]) 09/25/2018  . Hypertension 09/25/2018  . History of atherosclerotic cardiovascular disease 09/25/2018  . Bladder retention 09/25/2018  . Chronic anemia 09/25/2018  . Hyponatremia 09/25/2018  . Pulmonary nodule 09/25/2018  . PAD  (peripheral artery disease) ([place]) 06/06/2017  . Fatigue 04/26/2017  . Claudication of both lower extremities ([place]) 04/26/2017  . Cellulitis of left lower leg 01/08/2015  . Cellulitis 01/08/2015  . S/P AVR 01/08/2015  . Chest discomfort 05/13/2014  . Chest pain 05/04/2014  . Diverticulosis 05/13/2013  . Vertigo 05/13/2013  . Colitis 06/23/2012  . Hx of CABG 11/18/2010  . History of prosthetic aortic valve 11/18/2010  . Benign hypertensive heart disease without heart failure 11/18/2010  . Hypercholesterolemia 11/18/2010  . History of prostate cancer 11/18/2010    Past Surgical History:  Procedure Laterality Date  . CARDIAC CATHETERIZATION     EF of 66%  . CARDIAC VALVE REPLACEMENT  2006  . CORONARY ARTERY BYPASS GRAFT  1987   x5  . I&D EXTREMITY Left 01/10/2015   Procedure: DEBRIDEMENT ANKLE PLACEMENT ANTIBIOTIC BEADS AND WOUND VAC;  Surgeon: [name], MD;  Location: [facility];  Service: Orthopedics;  Laterality: Left;  . IR LUMBAR [place] W/IMG GUIDE  11/10/2018        Home Medications    Prior to Admission medications   Medication Sig Start Date End Date Taking? Authorizing Provider  amLODipine (NORVASC) 10 MG tablet Take 1 tablet (10 mg total) by mouth daily. 11/13/18   Kayleen Memos, DO  ampicillin IVPB Inject 2 g into the vein every 8 (eight) hours for 69 doses. Indication:  Discitis Last Day of Therapy:  12/05/2018 Labs -  Once weekly:  CBC/D and BMP, Labs - Every other week:  ESR and CRP 11/13/18 12/06/18  Kayleen Memos, DO  aspirin EC 81 MG tablet Take 81 mg by mouth daily.    [provider]  atorvastatin (LIPITOR) 40 MG tablet Take 40 mg by mouth daily.    [provider]  Calcium Carb-Cholecalciferol (CALCIUM 1000 + D PO) Take 1,000 mg by mouth daily.    [provider]  cefTRIAXone (ROCEPHIN) IVPB Inject 2 g into the vein every 12 (twelve) hours for 46 doses. Indication:  Discitis Last Day of Therapy:  12/05/2018 Labs - Once  weekly:  CBC/D and BMP, Labs - Every other week:  ESR and CRP 11/13/18 12/06/18  Kayleen Memos, DO  Cholecalciferol (VITAMIN D) 2000 UNITS CAPS Take 2,000 Units by mouth daily.    [provider]  cyclobenzaprine (FLEXERIL) 5 MG tablet Take 1 tablet (5 mg total) by mouth 2 (two) times daily. 10/02/18   Swayze, Ava, DO  folic acid (FOLVITE) 122 MCG tablet Take 400 mcg by mouth daily.     [provider]  Glucosamine HCl (GLUCOSAMINE PO) Take 1 tablet by mouth daily.    [provider]  HYDROcodone-acetaminophen (NORCO/VICODIN) 5-325 MG tablet Take 1-2 tablets by mouth every 4 (four) hours as needed for moderate pain. 10/02/18   Swayze, Ava, DO  metoprolol succinate (TOPROL-XL) 50 MG 24 hr tablet TAKE 1 TABLET BY MOUTH DAILY. DUE FOR FOLLOW UP APPOINTMENT. KEEP OFFICE VISIT Patient taking differently: Take 50 mg by mouth daily.  02/21/18   Skeet Latch, MD  Multiple Vitamin (MULTIVITAMIN WITH MINERALS) TABS Take 1 tablet by mouth daily. [place]    [provider]  timolol (TIMOPTIC) 0.5 % ophthalmic solution Place 1 drop into the right eye 2 (two) times daily.  11/07/17   [provider]  vitamin C (ASCORBIC ACID) 500 MG tablet Take 500 mg by mouth daily.    [provider]    Family History Family History  Problem Relation Age of Onset  . Heart disease Father   . Heart failure Mother   . Heart attack Brother   . Congestive Heart Failure Sister   . Congestive Heart Failure Sister     Social History Social History   Tobacco Use  . Smoking status: Former Smoker    Last attempt to quit: 11/18/1955    Years since quitting: 63.0  . Smokeless tobacco: Never Used  Substance Use Topics  . Alcohol use: No  . Drug use: No     Allergies   Patient has no known allergies.   Review of Systems Review of Systems  All other systems reviewed and are negative.    Physical Exam Updated Vital Signs BP 135/64 (BP Location: Left Arm)    Pulse 83   Temp 98.3 F (36.8 C) (Oral)   Resp (!) 22   SpO2 97%   Physical Exam Vitals signs and nursing note reviewed.  Constitutional:      General: He is not in acute distress.    Appearance: He is well-developed. He is not diaphoretic.  HENT:     Head: Normocephalic and atraumatic.  Eyes:     Extraocular Movements: Extraocular movements intact.     Pupils: Pupils are equal, round, and reactive to light.  Neck:     Musculoskeletal: Normal range of motion and neck supple.  Cardiovascular:     Rate and Rhythm: Normal rate and regular rhythm.  Heart sounds: No murmur. No friction rub.  Pulmonary:     Effort: Pulmonary effort is normal. No respiratory distress.     Breath sounds: Normal breath sounds. No wheezing or rales.  Abdominal:     General: Bowel sounds are normal. There is no distension.     Palpations: Abdomen is soft.     Tenderness: There is no abdominal tenderness.  Musculoskeletal: Normal range of motion.  Skin:    General: Skin is warm and dry.  Neurological:     General: No focal deficit present.     Mental Status: He is alert and oriented to person, place, and time.     Cranial Nerves: No cranial nerve deficit.     Motor: No weakness.     Coordination: Coordination normal.      ED Treatments / Results  Labs (all labs ordered are listed, but only abnormal results are displayed) Labs Reviewed  BASIC METABOLIC PANEL  CBC WITH DIFFERENTIAL/PLATELET  PROTIME-INR    EKG EKG Interpretation  Date/Time:  Thursday Nov 14 2018 12:18:07 EDT Ventricular Rate:  97 PR Interval:    QRS Duration: 102 QT Interval:  361 QTC Calculation: 459 R Axis:   70 Text Interpretation:  Sinus rhythm Multiple premature complexes, vent & supraven similar to previous Confirmed by Theotis Burrow [phone]) on 11/14/2018 3:23:19 PM   Radiology [place] Ekg Site Rite  Result Date: 11/12/2018 If Site Rite image not attached, placement could not be confirmed due to current cardiac  rhythm.   Procedures Procedures (including critical care time)  Medications Ordered in ED Medications - No data to display   Initial Impression / Assessment and Plan / ED Course  I have reviewed the triage vital signs and the nursing notes.  Pertinent labs & imaging results that were available during my care of the patient were reviewed by me and considered in my medical decision making (see chart for details).  Patient presenting with complaints of difficulty speaking.  This occurred this morning after an episode of pain in his back.  He is currently being treated for discitis with IV antibiotics by PICC line.  Patient symptoms rapidly improving upon arrival.  Patient sent for an MRI of his brain to rule out the possibility of stroke versus TIA.  Patient's care will be signed out to oncoming provider awaiting results of this test.  Final Clinical Impressions(s) / ED Diagnoses   Final diagnoses:  None    ED Discharge Orders    None       Veryl Speak, MD 11/14/18 1558

## 2018-11-14 NOTE — ED Notes (Signed)
ED TO INPATIENT HANDOFF REPORT  ED Nurse Name and Phone #: [phone]  S Name/Age/Gender Randy Chen 85 y.o. male Room/Bed: TRAAC/TRAAC  Code Status   Code Status: Prior  Home/SNF/Other Home Patient oriented to: self, place, time and situation Is this baseline? Yes   Triage Complete: Triage complete  Chief Complaint Slurred Speech; Spinal Infection  Triage Note Pt here for evaluation of slurred speech and aphasia that started this morning. Unknown LKW, pts wife found the pt leaning over the bed mumbling. Pt was recently discharged yesterday for a spinal infection and abdominal infection. Pt alert, no weakness or facial droop noted but pt is having trouble getting some words out.    Allergies No Known Allergies  Level of Care/Admitting Diagnosis ED Disposition    ED Disposition Condition Comment   Admit  Hospital Area: [place] [[id number]]  Level of Care: Telemetry Medical [104]  Covid Evaluation: N/A  Diagnosis: Acute CVA (cerebrovascular accident) [facility]) [[id number]]  Admitting Physician: Cristal Ford [phone]  Attending Physician: Cristal Ford [phone]  Estimated length of stay: past midnight tomorrow  Certification:: I certify this patient will need inpatient services for at least 2 midnights  PT Class (Do Not Modify): Inpatient [101]  PT Acc Code (Do Not Modify): Private [1]       B Medical/Surgery History Past Medical History:  Diagnosis Date  . Aortic stenosis   . Cancer ([place])   . Coronary artery disease    CABG x5 - 1987  . History of atherosclerotic cardiovascular disease   . History of prostatectomy   . Hyperlipidemia   . Hypertension   . PAD (peripheral artery disease) ([place]) 06/06/2017   Past Surgical History:  Procedure Laterality Date  . CARDIAC CATHETERIZATION     EF of 66%  . CARDIAC VALVE REPLACEMENT  2006  . CORONARY ARTERY BYPASS GRAFT  1987   x5  . I&D EXTREMITY Left 01/10/2015   Procedure: DEBRIDEMENT  ANKLE PLACEMENT ANTIBIOTIC BEADS AND WOUND VAC;  Surgeon: [name], MD;  Location: [place];  Service: Orthopedics;  Laterality: Left;  . IR LUMBAR [place] W/IMG GUIDE  11/10/2018     A IV Location/Drains/Wounds Patient Lines/Drains/Airways Status   Active Line/Drains/Airways    Name:   Placement date:   Placement time:   Site:   Days:   PICC Single Lumen 48/25/00 PICC Right Basilic 41 cm 1 cm   37/04/88    8916    Basilic   2   External Urinary Catheter   11/09/18    2200    -   5          Intake/Output Last 24 hours No intake or output data in the 24 hours ending 11/14/18 1745  Labs/Imaging Results for orders placed or performed during the hospital encounter of 11/14/18 (from the past 48 hour(s))  Basic metabolic panel     Status: Abnormal   Collection Time: 11/14/18 12:44 PM  Result Value Ref Range   Sodium 137 135 - 145 mmol/L   Potassium 3.8 3.5 - 5.1 mmol/L   Chloride 99 98 - 111 mmol/L   CO2 26 22 - 32 mmol/L   Glucose, Bld 117 (H) 70 - 99 mg/dL   BUN 17 8 - 23 mg/dL   Creatinine, Ser 1.45 (H) 0.61 - 1.24 mg/dL   Calcium 8.9 8.9 - 10.3 mg/dL   GFR calc non Af Amer 43 (L) >60 mL/min   GFR calc Af Amer 49 (L) >  60 mL/min   Anion gap 12 5 - 15    Comment: Performed at [facility] [address], [place], [place] [address]  CBC with Differential     Status: Abnormal   Collection Time: 11/14/18 12:44 PM  Result Value Ref Range   WBC 11.6 (H) 4.0 - 10.5 K/uL   RBC 3.13 (L) 4.22 - 5.81 MIL/uL   Hemoglobin 8.6 (L) 13.0 - 17.0 g/dL   HCT 27.9 (L) 39.0 - 52.0 %   MCV 89.1 80.0 - 100.0 fL   MCH 27.5 26.0 - 34.0 pg   MCHC 30.8 30.0 - 36.0 g/dL   RDW 14.2 11.5 - 15.5 %   Platelets 283 150 - 400 K/uL   nRBC 0.0 0.0 - 0.2 %   Neutrophils Relative % 81 %   Neutro Abs 9.3 (H) 1.7 - 7.7 K/uL   Lymphocytes Relative 12 %   Lymphs Abs 1.4 0.7 - 4.0 K/uL   Monocytes Relative 7 %   Monocytes Absolute 0.8 0.1 - 1.0 K/uL   Eosinophils Relative 0 %   Eosinophils  Absolute 0.0 0.0 - 0.5 K/uL   Basophils Relative 0 %   Basophils Absolute 0.0 0.0 - 0.1 K/uL   Immature Granulocytes 0 %   Abs Immature Granulocytes 0.04 0.00 - 0.07 K/uL    Comment: Performed at [facility], [place] [address], [place], [place] [address]  Protime-INR     Status: None   Collection Time: 11/14/18 12:44 PM  Result Value Ref Range   Prothrombin Time 14.6 11.4 - 15.2 seconds   INR 1.2 0.8 - 1.2    Comment: (NOTE) INR goal varies based on device and disease states. Performed at [facility], [place] [address], [place], [place] [address]   SARS Coronavirus 2 (CEPHEID - Performed in [facility] hospital lab), Hosp Order     Status: None   Collection Time: 11/14/18 12:56 PM  Result Value Ref Range   SARS Coronavirus 2 NEGATIVE NEGATIVE    Comment: (NOTE) If result is NEGATIVE SARS-CoV-2 target nucleic acids are NOT DETECTED. The SARS-CoV-2 RNA is generally detectable in upper and lower  respiratory specimens during the acute phase of infection. The lowest  concentration of SARS-CoV-2 viral copies this assay can detect is 250  copies / mL. A negative result does not preclude SARS-CoV-2 infection  and should not be used as the sole basis for treatment or other  patient management decisions.  A negative result may occur with  improper specimen collection / handling, submission of specimen other  than nasopharyngeal swab, presence of viral mutation(s) within the  areas targeted by this assay, and inadequate number of viral copies  (<250 copies / mL). A negative result must be combined with clinical  observations, patient history, and epidemiological information. If result is POSITIVE SARS-CoV-2 target nucleic acids are DETECTED. The SARS-CoV-2 RNA is generally detectable in upper and lower  respiratory specimens dur ing the acute phase of infection.  Positive  results are indicative of active infection with SARS-CoV-2.  Clinical  correlation with patient history and  other diagnostic information is  necessary to determine patient infection status.  Positive results do  not rule out bacterial infection or co-infection with other viruses. If result is PRESUMPTIVE POSTIVE SARS-CoV-2 nucleic acids MAY BE PRESENT.   A presumptive positive result was obtained on the submitted specimen  and confirmed on repeat testing.  While 2019 novel coronavirus  (SARS-CoV-2) nucleic acids may  be present in the submitted sample  additional confirmatory testing may be necessary for epidemiological  and / or clinical management purposes  to differentiate between  SARS-CoV-2 and other Sarbecovirus currently known to infect humans.  If clinically indicated additional testing with an alternate test  methodology [phone]) is advised. The SARS-CoV-2 RNA is generally  detectable in upper and lower respiratory sp ecimens during the acute  phase of infection. The expected result is Negative. Fact Sheet for Patients:  StrictlyIdeas.no Fact Sheet for Healthcare Providers: BankingDealers.co.za This test is not yet approved or cleared by the [place] FDA and has been authorized for detection and/or diagnosis of SARS-CoV-2 by FDA under an Emergency Use Authorization (EUA).  This EUA will remain in effect (meaning this test can be used) for the duration of the COVID-19 declaration under Section 564(b)(1) of the Act, 21 U.S.C. section 360bbb-3(b)(1), unless the authorization is terminated or revoked sooner. Performed at [facility], [place] [address] [place], [place] [address]    Mr Brain 44 Contrast  Result Date: 11/14/2018 CLINICAL DATA:  Speech abnormality rule out stroke EXAM: MRI HEAD WITHOUT CONTRAST TECHNIQUE: Multiplanar, multiecho pulse sequences of the brain and surrounding structures were obtained without intravenous contrast. COMPARISON:  None. FINDINGS: Brain: Small area of diffusion hyperintensity right parietal  white matter measuring approximately 3 mm. Probable acute infarct. No other areas of acute infarction. Moderate atrophy. Moderate chronic microvascular ischemic changes throughout the white matter. Chronic infarct right cerebellum. Negative for hemorrhage mass or edema. Vascular: Normal arterial flow voids Skull and upper cervical spine: Negative Sinuses/Orbits: Mild mucosal edema in the paranasal sinuses. Probable chronic medial orbital fracture on the left. Bilateral cataract surgery Other: None IMPRESSION: Small acute infarct right parietal white matter Atrophy and moderate chronic ischemic change. Electronically Signed   By: Franchot Gallo M.D.   On: 11/14/2018 16:43    Pending Labs [employer] (From admission, onward)    Start     Ordered   Signed and Held  Hemoglobin A1c  Tomorrow morning,   R     Signed and Held   Signed and Held  Lipid panel  Tomorrow morning,   R    Comments:  Fasting    Signed and Held   Signed and Held  CBC  (enoxaparin (LOVENOX)    CrCl >/= 30 with major trauma, spinal cord injury, or selected orthopedic surgery)  Once,   R    Comments:  Baseline for enoxaparin therapy IF NOT already drawn.  Notify MD if PLT < 100 K.    Signed and Held   Signed and Held  Creatinine, serum  (enoxaparin (LOVENOX)    CrCl >/= 30 with major trauma, spinal cord injury, or selected orthopedic surgery)  Once,   R    Comments:  Baseline for enoxaparin therapy IF NOT already drawn.    Signed and Held   Signed and Held  Creatinine, serum  (enoxaparin (LOVENOX)    CrCl >/= 30 with major trauma, spinal cord injury, or selected orthopedic surgery)  Weekly,   R    Comments:  while on enoxaparin therapy.    Signed and Held          Vitals/Pain Today's Vitals   11/14/18 1515 11/14/18 1700 11/14/18 1715 11/14/18 1730  BP: (!) 110/51 (!) 148/72 129/77 134/70  Pulse: 81 91 (!) 104 83  Resp: 16 (!) 21 20 (!) 23  Temp:      TempSrc:  SpO2: 98% 97% 99% 97%  Weight:      Height:       PainSc:        Isolation Precautions No active isolations  Medications Medications - No data to display  Mobility walks Moderate fall risk   Focused Assessments Neuro Assessment Handoff:  Swallow screen pass? Yes    NIH Stroke Scale ( + Modified Stroke Scale Criteria)  Level of Consciousness (1a.)   : Alert, keenly responsive LOC Questions (1b. )   +: Answers both questions correctly LOC Commands (1c. )   + : Performs both tasks correctly Best Gaze (2. )  +: Normal Visual (3. )  +: No visual loss Facial Palsy (4. )    : Normal symmetrical movements Motor Arm, Left (5a. )   +: No drift Motor Arm, Right (5b. )   +: No drift Motor Leg, Left (6a. )   +: No drift Motor Leg, Right (6b. )   +: Drift Limb Ataxia (7. ): Absent Sensory (8. )   +: Normal, no sensory loss Best Language (9. )   +: Mild-to-moderate aphasia Dysarthria (10. ): Mild-to-moderate dysarthria, patient slurs at least some words and, at worst, can be understood with some difficulty Extinction/Inattention (11.)   +: No Abnormality Modified SS Total  +: 2 Complete NIHSS TOTAL: 3     Neuro Assessment: Exceptions to WDL Neuro Checks:      Last Documented NIHSS Modified Score: 2 (11/14/18 1500) Has TPA been given? No If patient is a Neuro Trauma and patient is going to OR before floor call report to [facility] nurse: [phone] or [phone]     R Recommendations: See Admitting Provider Note  Report given to:   Additional Notes:

## 2018-11-14 NOTE — Telephone Encounter (Signed)
Yes definitely needs to come to ER

## 2018-11-14 NOTE — ED Triage Notes (Signed)
Pt here for evaluation of slurred speech and aphasia that started this morning. Unknown LKW, pts wife found the pt leaning over the bed mumbling. Pt was recently discharged yesterday for a spinal infection and abdominal infection. Pt alert, no weakness or facial droop noted but pt is having trouble getting some words out.

## 2018-11-14 NOTE — Consult Note (Signed)
Images from MRI brain reviewed for second opinion. The focus of mildly hyperintense signal in the right frontal lobe deep white matter on DWI that was described as an acute infarction on the official Radiology report shows no corresponding hypointensity on neither the axial nor coronal ADC maps. There is corresponding hyperintensity on the T2-weighted images. The finding is not consistent with an acute stroke based on imaging criteria and appears most consistent with T2-shine-through artifact secondary to tissue rarefaction with corresponding chronically increased water content from chronic small vessel ischemia at that location.   Electronically signed: Dr. Kerney Elbe

## 2018-11-14 NOTE — H&P (Signed)
[facility] History and Physical  Randy Chen ZSW:[id number] DOB: Jan 03, 1928 DOA: 11/14/2018  PCP: [name], MD  Patient coming from: Home  Chief Complaint: Slurred Speech  HPI: Randy Chen is a 85 y.o. male with a medical history of essential hypertension, coronary artery disease with history of CABG, hyperlipidemia, recently diagnosed osteomyelitis/discitis on ceftriaxone and ampicillin, scented to the emergency department with complaints of slurred speech.  Patient was recently discharged home on 11/13/2018 on IV antibiotics.  Patient woke up this morning and noted that he had lower abdominal pain and was unable to move.  Per ED, wife noted the patient had slurred speech and she could not understand what he was trying to communicate with her.  Currently in the emergency department, patient's speech does appear to be improving.  He denies any current chest pain, shortness of breath, abdominal pain, nausea or vomiting, diarrhea, dizziness or headache, problems with urination.  He does endorse constipation and has not had a bowel movement in 3 days.  Patient does have back pain and has been using Tylenol as well as hydrocodone, he ran out of his hydrocodone 11/13/2018.   ED Course: MRI of the brain was obtained showing an acute CVA.  TRH called for admission.  Neurology consulted.  Review of Systems:  All other systems reviewed and are negative.   Past Medical History:  Diagnosis Date  . Aortic stenosis   . Cancer ([place])   . Coronary artery disease    CABG x5 - 1987  . History of atherosclerotic cardiovascular disease   . History of prostatectomy   . Hyperlipidemia   . Hypertension   . PAD (peripheral artery disease) ([place]) 06/06/2017    Past Surgical History:  Procedure Laterality Date  . CARDIAC CATHETERIZATION     EF of 66%  . CARDIAC VALVE REPLACEMENT  2006  . CORONARY ARTERY BYPASS GRAFT  1987   x5  . I&D EXTREMITY Left 01/10/2015   Procedure: DEBRIDEMENT  ANKLE PLACEMENT ANTIBIOTIC BEADS AND WOUND VAC;  Surgeon: [name], MD;  Location: [place];  Service: Orthopedics;  Laterality: Left;  . IR LUMBAR [place] W/IMG GUIDE  11/10/2018    Social History:  reports that he quit smoking about 63 years ago. He has never used smokeless tobacco. He reports that he does not drink alcohol or use drugs.  No Known Allergies  Family History  Problem Relation Age of Onset  . Heart disease Father   . Heart failure Mother   . Heart attack Brother   . Congestive Heart Failure Sister   . Congestive Heart Failure Sister     Prior to Admission medications   Medication Sig Start Date End Date Taking? Authorizing Provider  amLODipine (NORVASC) 10 MG tablet Take 1 tablet (10 mg total) by mouth daily. 11/13/18  Yes Irene Pap N, DO  ampicillin IVPB Inject 2 g into the vein every 8 (eight) hours for 69 doses. Indication:  Discitis Last Day of Therapy:  12/05/2018 Labs - Once weekly:  CBC/D and BMP, Labs - Every other week:  ESR and CRP 11/13/18 12/06/18  Kayleen Memos, DO  aspirin EC 81 MG tablet Take 81 mg by mouth daily.    [provider]  atorvastatin (LIPITOR) 40 MG tablet Take 40 mg by mouth daily.    [provider]  Calcium Carb-Cholecalciferol (CALCIUM 1000 + D PO) Take 1,000 mg by mouth daily.    [provider]  cefTRIAXone (ROCEPHIN) IVPB Inject  2 g into the vein every 12 (twelve) hours for 46 doses. Indication:  Discitis Last Day of Therapy:  12/05/2018 Labs - Once weekly:  CBC/D and BMP, Labs - Every other week:  ESR and CRP 11/13/18 12/06/18  Kayleen Memos, DO  Cholecalciferol (VITAMIN D) 2000 UNITS CAPS Take 2,000 Units by mouth daily.    [provider]  cyclobenzaprine (FLEXERIL) 5 MG tablet Take 1 tablet (5 mg total) by mouth 2 (two) times daily. 10/02/18   Swayze, Ava, DO  folic acid (FOLVITE) 572 MCG tablet Take 400 mcg by mouth daily.     [provider]  Glucosamine HCl (GLUCOSAMINE PO)  Take 1 tablet by mouth daily.    [provider]  HYDROcodone-acetaminophen (NORCO/VICODIN) 5-325 MG tablet Take 1-2 tablets by mouth every 4 (four) hours as needed for moderate pain. 10/02/18   Swayze, Ava, DO  metoprolol succinate (TOPROL-XL) 50 MG 24 hr tablet TAKE 1 TABLET BY MOUTH DAILY. DUE FOR FOLLOW UP APPOINTMENT. KEEP OFFICE VISIT Patient taking differently: Take 50 mg by mouth daily.  02/21/18   Skeet Latch, MD  Multiple Vitamin (MULTIVITAMIN WITH MINERALS) TABS Take 1 tablet by mouth daily. [place]    [provider]  timolol (TIMOPTIC) 0.5 % ophthalmic solution Place 1 drop into the right eye 2 (two) times daily.  11/07/17   [provider]  vitamin C (ASCORBIC ACID) 500 MG tablet Take 500 mg by mouth daily.    [provider]    Physical Exam: Vitals:   11/14/18 1715 11/14/18 1730  BP: 129/77 134/70  Pulse: (!) 104 83  Resp: 20 (!) 23  Temp:    SpO2: 99% 97%     General: Well developed, well nourished, NAD, appears stated age  HEENT: NCAT, PERRLA, EOMI, Anicteic Sclera, mucous membranes moist.   Neck: Supple, no JVD, no masses  Cardiovascular: S1 S2 auscultated, no rubs, murmurs or gallops. Regular rate and rhythm.  Respiratory: Clear to auscultation bilaterally with equal chest rise  Abdomen: Soft, nontender, nondistended, + bowel sounds  Extremities: warm dry without cyanosis clubbing or edema  Neuro: AAOx3, cranial nerves grossly intact. Strength 5/5 in patient's upper and lower extremities bilaterally. Mildly slurred speech.  Skin: Without rashes exudates or nodules  Psych: Normal affect and demeanor with intact judgement and insight  Labs on Admission: I have personally reviewed following labs and imaging studies CBC: Recent Labs  Lab 11/09/18 1233 11/10/18 0341 11/11/18 0415 11/12/18 0313 11/14/18 1244  WBC 7.9 6.5 6.9 8.1 11.6*  NEUTROABS 5.9  --   --   --  9.3*  HGB 8.6* 7.7* 8.3* 8.5* 8.6*  HCT 27.5*  24.1* 25.9* 26.7* 27.9*  MCV 89.6 87.6 87.8 87.5 89.1  PLT 299 245 238 256 620   Basic Metabolic Panel: Recent Labs  Lab 11/09/18 1233 11/10/18 0341 11/11/18 0415 11/12/18 0313 11/14/18 1244  NA 134* 136 138 136 137  K 3.6 3.3* 3.6 3.8 3.8  CL 99 103 103 100 99  CO2 _0 GLUCOSE 122* 89 104* 115* 117*  BUN _1 CREATININE 1.32* 1.16 1.11 1.01 1.45*  CALCIUM 8.3* 8.0* 8.3* 8.4* 8.9   GFR: Estimated Creatinine Clearance: 36.4 mL/min (A) (by C-G formula based on SCr of 1.45 mg/dL (H)). Liver Function Tests: Recent Labs  Lab 11/09/18 1233 11/11/18 0415  AST 25 27  ALT 18 17  ALKPHOS 94 98  BILITOT 0.4 0.4  PROT 6.3*  5.3*  ALBUMIN 3.0* 2.3*   No results for input(s): LIPASE, AMYLASE in the last 168 hours. No results for input(s): AMMONIA in the last 168 hours. Coagulation Profile: Recent Labs  Lab 11/10/18 0940 11/14/18 1244  INR 1.1 1.2   Cardiac Enzymes: No results for input(s): CKTOTAL, CKMB, CKMBINDEX, TROPONINI in the last 168 hours. BNP (last 3 results) No results for input(s): PROBNP in the last 8760 hours. HbA1C: No results for input(s): HGBA1C in the last 72 hours. CBG: No results for input(s): GLUCAP in the last 168 hours. Lipid Profile: No results for input(s): CHOL, HDL, LDLCALC, TRIG, CHOLHDL, LDLDIRECT in the last 72 hours. Thyroid Function Tests: No results for input(s): TSH, T4TOTAL, FREET4, T3FREE, THYROIDAB in the last 72 hours. Anemia Panel: No results for input(s): VITAMINB12, FOLATE, FERRITIN, TIBC, IRON, RETICCTPCT in the last 72 hours. Urine analysis:    Component Value Date/Time   COLORURINE YELLOW 11/09/2018 [place] 11/09/2018 1442   LABSPEC 1.015 11/09/2018 1442   PHURINE 7.0 11/09/2018 1442   GLUCOSEU NEGATIVE 11/09/2018 1442   HGBUR NEGATIVE 11/09/2018 1442   BILIRUBINUR NEGATIVE 11/09/2018 1442   KETONESUR NEGATIVE 11/09/2018 1442   PROTEINUR NEGATIVE 11/09/2018 1442   UROBILINOGEN  0.2 01/09/2015 1853   NITRITE NEGATIVE 11/09/2018 1442   LEUKOCYTESUR NEGATIVE 11/09/2018 1442   Sepsis Labs: _0 (procalcitonin:4,lacticidven:4) ) Recent Results (from the past 240 hour(s))  Blood Culture (routine x 2)     Status: None   Collection Time: 11/09/18 12:30 PM  Result Value Ref Range Status   Specimen Description   Final    BLOOD RIGHT ARM Performed at [facility], [address] [place], [place] [address]    Special Requests   Final    BOTTLES DRAWN AEROBIC AND ANAEROBIC Blood Culture adequate volume Performed at [facility], [address] [place], [place] [address]    Culture   Final    NO GROWTH 5 DAYS Performed at [facility], [place] [address], [place], [place] [address]    Report Status 11/14/2018 FINAL  Final  Blood Culture (routine x 2)     Status: None   Collection Time: 11/09/18 12:45 PM  Result Value Ref Range Status   Specimen Description   Final    BLOOD LEFT ARM Performed at [facility], [address] [place], [place] [address]    Special Requests   Final    BOTTLES DRAWN AEROBIC AND ANAEROBIC Blood Culture adequate volume Performed at [facility], [address] [place], [place] [address]    Culture   Final    NO GROWTH 5 DAYS Performed at [facility], [place] [address], [place],  [address]    Report Status 11/14/2018 FINAL  Final  SARS Coronavirus 2 (Hosp order,Performed in [facility] lab via [employer] ID)     Status: None   Collection Time: 11/09/18 12:49 PM  Result Value Ref Range Status   SARS Coronavirus 2 ([employer] Now) NEGATIVE NEGATIVE Final    Comment: (NOTE) Interpretive Result Comment(s): COVID 19 Positive SARS CoV 2 target nucleic acids are DETECTED. The SARS CoV 2 RNA is generally detectable in upper and lower respiratory specimens during the acute phase of infection.  Positive results are indicative of active infection with SARS CoV 2.  Clinical  correlation with patient history and other diagnostic information is necessary to determine patient infection status.  Positive results do  not rule out bacterial infection or coinfection with other viruses. The expected result is Negative. COVID 19 Negative SARS CoV 2 target nucleic acids are NOT DETECTED. The SARS CoV 2 RNA is generally detectable in upper and lower respiratory specimens during the acute phase of infection.  Negative results do not preclude SARS CoV 2 infection, do not rule out coinfections with other pathogens, and should not be used as the sole basis for treatment or other patient management decisions.  Negative results must be combined with clinical  observations, patient history, and epidemiological information. The expected result is Negative. Invalid Presence or absence of SARS CoV 2 nucleic acids cannot be determined. Repeat testing was performed on the submitted specimen and repeated Invalid results were obtained.  If clinically indicated, additional testing on a new specimen with an alternate test methodology [phone]) is advised.  The SARS CoV 2 RNA is generally detectable in upper and lower respiratory specimens during the acute phase of infection. The expected result is Negative. Fact Sheet for Patients:  GolfingFamily.no Fact Sheet for Healthcare Providers: https://www.hernandez-brewer.com/ This test is not yet approved or cleared by the [place] FDA and has been authorized for detection and/or diagnosis of SARS CoV 2 by FDA under an Emergency Use Authorization (EUA).  This EUA will remain in effect (meaning this test can be used) for the duration of the COVID19 d eclaration under Section 564(b)(1) of the Act, 21 U.S.C. section 8540842869 3(b)(1), unless the authorization is terminated or revoked sooner. Performed at [facility], [address], [place], [place] [address]   Urine culture     Status:  None   Collection Time: 11/09/18  2:42 PM  Result Value Ref Range Status   Specimen Description   Final    URINE, RANDOM Performed at [facility], [address] [place], [place] [address]    Special Requests   Final    NONE Performed at [facility], [address] [place], [place] [address]    Culture   Final    NO GROWTH Performed at [facility], [place] [address], [place], [place] [address]    Report Status 11/11/2018 FINAL  Final  Surgical PCR screen     Status: None   Collection Time: 11/10/18  5:58 AM  Result Value Ref Range Status   MRSA, PCR NEGATIVE NEGATIVE Final   Staphylococcus aureus NEGATIVE NEGATIVE Final    Comment: (NOTE) The Xpert SA Assay (FDA approved for NASAL specimens in patients 74 years of age and older), is one component of a comprehensive surveillance program. It is not intended to diagnose infection nor to guide or monitor treatment. Performed at [facility], [place] [address], [place], [place] [address]   Aerobic/Anaerobic Culture (surgical/deep wound)     Status: None (Preliminary result)   Collection Time: 11/10/18 12:40 PM  Result Value Ref Range Status   Specimen Description NEEDLE ASPIRATE DISC  Final   Special Requests NONE  Final   Gram Stain   Final    RARE WBC PRESENT, PREDOMINANTLY PMN NO ORGANISMS SEEN    Culture   Final    NO GROWTH 4 DAYS NO ANAEROBES ISOLATED; CULTURE IN PROGRESS FOR 5 DAYS Performed at [facility], [place] [address], [place], [place] [address]    Report Status PENDING  Incomplete  SARS Coronavirus 2 (CEPHEID - Performed in [place] hospital lab), Hosp Order     Status:  None   Collection Time: 11/14/18 12:56 PM  Result Value Ref Range Status   SARS Coronavirus 2 NEGATIVE NEGATIVE Final    Comment: (NOTE) If result is NEGATIVE SARS-CoV-2 target nucleic acids are NOT DETECTED. The SARS-CoV-2 RNA is generally detectable in upper and lower  respiratory specimens during  the acute phase of infection. The lowest  concentration of SARS-CoV-2 viral copies this assay can detect is 250  copies / mL. A negative result does not preclude SARS-CoV-2 infection  and should not be used as the sole basis for treatment or other  patient management decisions.  A negative result may occur with  improper specimen collection / handling, submission of specimen other  than nasopharyngeal swab, presence of viral mutation(s) within the  areas targeted by this assay, and inadequate number of viral copies  (<250 copies / mL). A negative result must be combined with clinical  observations, patient history, and epidemiological information. If result is POSITIVE SARS-CoV-2 target nucleic acids are DETECTED. The SARS-CoV-2 RNA is generally detectable in upper and lower  respiratory specimens dur ing the acute phase of infection.  Positive  results are indicative of active infection with SARS-CoV-2.  Clinical  correlation with patient history and other diagnostic information is  necessary to determine patient infection status.  Positive results do  not rule out bacterial infection or co-infection with other viruses. If result is PRESUMPTIVE POSTIVE SARS-CoV-2 nucleic acids MAY BE PRESENT.   A presumptive positive result was obtained on the submitted specimen  and confirmed on repeat testing.  While 2019 novel coronavirus  (SARS-CoV-2) nucleic acids may be present in the submitted sample  additional confirmatory testing may be necessary for epidemiological  and / or clinical management purposes  to differentiate between  SARS-CoV-2 and other Sarbecovirus currently known to infect humans.  If clinically indicated additional testing with an alternate test  methodology [phone]) is advised. The SARS-CoV-2 RNA is generally  detectable in upper and lower respiratory sp ecimens during the acute  phase of infection. The expected result is Negative. Fact Sheet for Patients:   StrictlyIdeas.no Fact Sheet for Healthcare Providers: BankingDealers.co.za This test is not yet approved or cleared by the [place] FDA and has been authorized for detection and/or diagnosis of SARS-CoV-2 by FDA under an Emergency Use Authorization (EUA).  This EUA will remain in effect (meaning this test can be used) for the duration of the COVID-19 declaration under Section 564(b)(1) of the Act, 21 U.S.C. section 360bbb-3(b)(1), unless the authorization is terminated or revoked sooner. Performed at [facility], [place] [address], [place], [place] [address]      Radiological Exams on Admission: Mr Brain Wo Contrast  Result Date: 11/14/2018 CLINICAL DATA:  Speech abnormality rule out stroke EXAM: MRI HEAD WITHOUT CONTRAST TECHNIQUE: Multiplanar, multiecho pulse sequences of the brain and surrounding structures were obtained without intravenous contrast. COMPARISON:  None. FINDINGS: Brain: Small area of diffusion hyperintensity right parietal white matter measuring approximately 3 mm. Probable acute infarct. No other areas of acute infarction. Moderate atrophy. Moderate chronic microvascular ischemic changes throughout the white matter. Chronic infarct right cerebellum. Negative for hemorrhage mass or edema. Vascular: Normal arterial flow voids Skull and upper cervical spine: Negative Sinuses/Orbits: Mild mucosal edema in the paranasal sinuses. Probable chronic medial orbital fracture on the left. Bilateral cataract surgery Other: None IMPRESSION: Small acute infarct right parietal white matter Atrophy and moderate chronic ischemic change. Electronically Signed   By: Franchot Gallo M.D.   On: 11/14/2018  16:43    EKG: Independently reviewed.  Sinus rhythm, rate 97, premature beat  Assessment/Plan  Acute CVA -Patient presented with slurred speech  -MRI brain: Small acute infarct right parietal white matter -Neurology consulted and  appreciated. Discussed this with Dr. Cheral Marker, who does not feel this is an Acute CVA. Have asked for a formal consult. Possibly TIA? -Patient had echocardiogram 09/26/2018: EF 55-60% -Lipid panel and hemoglobin A1c ordered -Carotid doppler ordered -Continue neuro checks  -Recently had PT consult on 11/13/2018, recommended HH PT -Pending speech therapy consult given aphasia -Of note, doubt that slurred speech is due to encephalopathy or encephalitis given that patient has been on ampicillin as well as ceftriaxone.  He is currently alert and oriented x3.  Currently afebrile with no leukocytosis. Do not feel LP is warranted at this time given no signs or symptoms of encephalopathy and patient had discitis and osteomyelitis, undergoing treatment. -Aspirin, statin  Chronic kidney disease, stage III -Stable, continue to monitor BMP  Recently diagnosed L2-L3 discitis/osteomyelitis -Secondary to enterococcal bacteremia -Patient noted to have possible prosthetic valve endocarditis from admission April 8 to April 15, and was discharged with a 6-week course of Rocephin and ampicillin. -Patient was recently discharged on 11/13/2018 and was to continue for additional weeks of IV antibiotics with ampicillin and ceftriaxone. -PICC line was placed on 11/12/2018 -Continue pain control  Chronic normocytic anemia -Hemoglobin currently stable, 8.6  Essential hypertension -Even the possibility of acute CVA, allow for permissive hypertension.  Hold antihypertensive medications  Hyperlipidemia -Continue statin  Coronary artery disease -History of CABG -Continue aspirin, statin, hold metoprolol -Currently chest pain-free  DVT prophylaxis: lovenox  Code Status: DNR  Family Communication: None at bedside. Admission, patients condition and plan of care including tests being ordered have been discussed with the patient, who indicates understanding and agrees with the plan and Code Status.  Disposition Plan:  Home with home health    Consults called: Neurology   Admission status: Admitted   Time spent: 70 minutes  Lucciana Head D.O. [facility]  Between 7am to 7pm - Please see pager noted on [email or web address]  After 7pm go to [email or web address] 11/14/2018, 5:47 PM

## 2018-11-14 NOTE — ED Notes (Addendum)
Pt here for evaluation of slurred speech and aphasia that started this morning. Unknown LKW, pts wife found the pt leaning over the bed mumbling. Pt was recently discharged on 11/13/2018  for a spinal infection and abdominal infection. Pt alert, no weakness or facial droop noted but pt is having aphasia.

## 2018-11-15 ENCOUNTER — Inpatient Hospital Stay (HOSPITAL_COMMUNITY): Payer: Medicare Other

## 2018-11-15 ENCOUNTER — Inpatient Hospital Stay (HOSPITAL_BASED_OUTPATIENT_CLINIC_OR_DEPARTMENT_OTHER): Payer: Medicare Other

## 2018-11-15 ENCOUNTER — Observation Stay (HOSPITAL_BASED_OUTPATIENT_CLINIC_OR_DEPARTMENT_OTHER): Payer: Medicare Other

## 2018-11-15 DIAGNOSIS — D649 Anemia, unspecified: Secondary | ICD-10-CM | POA: Diagnosis not present

## 2018-11-15 DIAGNOSIS — G459 Transient cerebral ischemic attack, unspecified: Secondary | ICD-10-CM | POA: Diagnosis present

## 2018-11-15 DIAGNOSIS — I639 Cerebral infarction, unspecified: Secondary | ICD-10-CM

## 2018-11-15 DIAGNOSIS — R7881 Bacteremia: Secondary | ICD-10-CM | POA: Diagnosis not present

## 2018-11-15 DIAGNOSIS — Z951 Presence of aortocoronary bypass graft: Secondary | ICD-10-CM | POA: Diagnosis not present

## 2018-11-15 DIAGNOSIS — N183 Chronic kidney disease, stage 3 (moderate): Secondary | ICD-10-CM | POA: Diagnosis not present

## 2018-11-15 LAB — ECHOCARDIOGRAM COMPLETE BUBBLE STUDY
Height: 67 in
Weight: 2945.35 oz

## 2018-11-15 LAB — HEMOGLOBIN A1C
Hgb A1c MFr Bld: 5.5 % (ref 4.8–5.6)
Mean Plasma Glucose: 111.15 mg/dL

## 2018-11-15 LAB — AEROBIC/ANAEROBIC CULTURE W GRAM STAIN (SURGICAL/DEEP WOUND): Culture: NO GROWTH

## 2018-11-15 LAB — LIPID PANEL
Cholesterol: 116 mg/dL (ref 0–200)
HDL: 28 mg/dL — ABNORMAL LOW (ref >40)
LDL Cholesterol: 63 mg/dL (ref 0–99)
Total CHOL/HDL Ratio: 4.1 RATIO
Triglycerides: 127 mg/dL (ref <150)
VLDL: 25 mg/dL (ref 0–40)

## 2018-11-15 MED ORDER — ATORVASTATIN CALCIUM 40 MG PO TABS: 40.0000 mg | ORAL_TABLET | Freq: Every day | ORAL | 0 refills | Status: DC

## 2018-11-15 MED ORDER — IOHEXOL 350 MG/ML SOLN
80.0000 mL | Freq: Once | INTRAVENOUS | Status: AC | PRN
  Administered 2018-11-15: 13:00:00 80 mL via INTRAVENOUS

## 2018-11-15 MED ORDER — CEFTRIAXONE IV (FOR PTA / DISCHARGE USE ONLY): 2.0000 g | Freq: Two times a day (BID) | INTRAVENOUS | 0 refills | Status: DC

## 2018-11-15 MED ORDER — AMPICILLIN IV (FOR PTA / DISCHARGE USE ONLY): 2.0000 g | Freq: Three times a day (TID) | INTRAVENOUS | 0 refills | Status: DC

## 2018-11-15 MED ORDER — HYDROCODONE-ACETAMINOPHEN 5-325 MG PO TABS: 1.0000 | ORAL_TABLET | ORAL | 0 refills | Status: DC | PRN

## 2018-11-15 MED ORDER — ASPIRIN EC 325 MG PO TBEC: 325.0000 mg | DELAYED_RELEASE_TABLET | Freq: Every day | ORAL | 0 refills | Status: DC

## 2018-11-15 NOTE — Discharge Summary (Signed)
Physician Discharge Summary  Randy Chen:[id number] DOB: Oct 20, 1927 DOA: 11/14/2018  PCP: [name], MD  Admit date: 11/14/2018 Discharge date: 11/16/2018  Time spent: 45 minutes  Recommendations for Outpatient Follow-up:  Patient will be discharged to home with home health physical and occupational therapy.  Patient will need to follow up with primary care provider within one week of discharge. Follow up with neurology. Patient should continue medications as prescribed.  Patient should follow a heart healthy diet.   Discharge Diagnoses:  Transient Dysarthria, TIA Chronic kidney disease, stage III Recently diagnosed L2-L3 discitis/osteomyelitis Chronic normocytic anemia Essential hypertension Hyperlipidemia Coronary artery disease  Discharge Condition: stable  Diet recommendation: heart healthy  Filed Weights   11/14/18 1452  Weight: 83.5 kg    History of present illness:  On 11/14/2018 Randy Chen is a 85 y.o. male with a medical history of essential hypertension, coronary artery disease with history of CABG, hyperlipidemia, recently diagnosed osteomyelitis/discitis on ceftriaxone and ampicillin, scented to the emergency department with complaints of slurred speech.  Patient was recently discharged home on 11/13/2018 on IV antibiotics.  Patient woke up this morning and noted that he had lower abdominal pain and was unable to move.  Per ED, wife noted the patient had slurred speech and she could not understand what he was trying to communicate with her.  Currently in the emergency department, patient's speech does appear to be improving.  He denies any current chest pain, shortness of breath, abdominal pain, nausea or vomiting, diarrhea, dizziness or headache, problems with urination.  He does endorse constipation and has not had a bowel movement in 3 days.  Patient does have back pain and has been using Tylenol as well as hydrocodone, he ran out of his hydrocodone  11/13/2018.   Hospital Course:  Transient Dysarthria, TIA -Patient presented with slurred speech  -MRI brain: Small acute infarct right parietal white matter -Neurology consulted and appreciated. Discussed this with Dr. Cheral Marker, who does not feel this is an Acute CVA. Have asked for a formal consult. Possibly TIA? -Discussed with radiology, Dr. Maree Erie, who had addended the MRI report: No acute infarction -Patient had echocardiogram 09/26/2018: EF 55-60% -LDL 63, Hemoglobin A1c 5.5 -Carotid doppler: B/L ICA 1-39% stenosis.  Vertebral artery flow antegrade -Recently had PT consult on 11/13/2018, recommended HH PT -OT recommended HH -Speech therapy evaluated patient, no further therapy needed -Continue Aspirin, statin -CTA head and neck: no intracranial hemorrhage.  Occlusion of proximal V4 segment of the nondominant left vertebral artery.  Chronicity unknown.  Normal enhancement of the left PICA.  Atherosclerosis of carotid bifurcations and skull base segments of both ICA without hemodynamically significant (greater than 50%) stenosis. -Echocardiogram shows an EF>65%.  LV diastolic Doppler parameters consistent with impaired relaxation.  Hemic obstruction with mid LV gradient of 13 mmHg.  Intra-atrial septum not assessed. -Discussed findings with neurology, Dr. Cheral Marker, recommended continuing aspirin, full dose daily. 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 -Patient was recently discharged on 11/13/2018 and was to continue for additional weeks of IV antibiotics with ampicillin and ceftriaxone. -PICC line was placed on 11/12/2018 -Continue pain control  Chronic normocytic anemia -Hemoglobin stable  Essential  hypertension -Even the possibility of acute CVA, allow for permissive hypertension.  Hold antihypertensive medications- resume on discharge  Hyperlipidemia -Continue statin  Coronary artery disease -History of CABG -Continue aspirin, statin, hold metoprolol -Currently chest pain-free  Procedures: Carotid doppler Echocardiogram  Consultations: Neurology   Discharge Exam: Vitals:   11/15/18 1229 11/15/18 1500  BP: (!) 170/69 138/73  Pulse: 100 97  Resp: 17 17  Temp: 97.6 F (36.4 C) 98.4 F (36.9 C)  SpO2: 97% 98%     General: Well developed, well nourished, NAD  HEENT: NCAT, mucous membranes moist.  Neck: Supple  Cardiovascular: S1 S2 auscultated, RRR, no murmur  Respiratory: Clear to auscultation bilaterally   Abdomen: Soft, nontender, nondistended, + bowel sounds  Extremities: warm dry without cyanosis clubbing or edema  Neuro: AAOx3, nonfocal  Psych: Normal affect and demeanor, pleasant   Discharge Instructions Discharge Instructions    Ambulatory referral to Neurology   Complete by:  As directed    An appointment is requested in approximately: 4 weeks   Diet - low sodium heart healthy   Complete by:  As directed    Discharge instructions   Complete by:  As directed    Follow up with your PCP. Follow up with neurology. Continue home health.   Home infusion instructions [facility] May follow [place] Dosing Protocol; May administer Cathflo as needed to maintain patency of vascular access device.; Flushing of vascular access device: per [facility] Protocol: 0.9% NaCl pre/post medica...   Complete by:  As directed    Instructions:  May follow [place] Dosing Protocol   Instructions:  May administer Cathflo as needed to maintain patency of vascular access device.   Instructions:  Flushing of vascular access device: per [facility] Protocol: 0.9% NaCl pre/post medication administration and prn patency; Heparin 100 u/ml, 51m for implanted ports and  Heparin 10u/ml, 565mfor all other central venous catheters.   Instructions:  May follow [facility] Anaphylaxis Protocol for First Dose Administration in the home: 0.9% NaCl at 25-50 ml/hr to maintain IV access for protocol meds. Epinephrine 0.3 ml IV/IM PRN and Benadryl 25-50 IV/IM PRN s/s of anaphylaxis.   Instructions:  [facility] Coordinator (RN) to assist per patient IV care needs in the home PRN.     Allergies as of 11/15/2018   No Known Allergies     Medication List    TAKE these medications   amLODipine 10 MG tablet Commonly known as:  NORVASC Take 1 tablet (10 mg total) by mouth daily.   ampicillin  IVPB Inject 2 g into the vein every 8 (eight) hours for 69 doses. Indication:  Discitis Last Day of Therapy:  12/05/2018 Labs - Once weekly:  CBC/D and BMP, Labs - Every other week:  ESR and CRP What changed:  Another medication with the same name was added. Make sure you understand how and when to take each.   ampicillin  IVPB Inject 2 g into the vein every 8 (eight) hours. Indication:  Osteomyelitis, discitis Last Day of Therapy: 12/05/18 Labs - Once weekly:  CBC/D and BMP, Labs - Every other week:  ESR and CRP What changed:  You were already taking a medication with the same name, and this prescription was added. Make sure you understand how and when to take each.   aspirin EC 325 MG tablet Take 1 tablet (325 mg total) by mouth daily for 30 days. What changed:    medication strength  how much to take   atorvastatin 40 MG tablet Commonly known as:  LIPITOR Take 1 tablet (40 mg total) by mouth  daily.   CALCIUM 600 PO Take 600 mg by mouth daily.   cefTRIAXone  IVPB Commonly known as:  ROCEPHIN Inject 2 g into the vein every 12 (twelve) hours for 46 doses. Indication:  Discitis Last Day of Therapy:  12/05/2018 Labs - Once weekly:  CBC/D and BMP, Labs - Every other week:  ESR and CRP What changed:  Another medication with the same name was added. Make sure  you understand how and when to take each.   cefTRIAXone  IVPB Commonly known as:  ROCEPHIN Inject 2 g into the vein every 12 (twelve) hours. Indication:  Osteomyelitis, discitis Last Day of Therapy:  12/05/18 Labs - Once weekly:  CBC/D and BMP, Labs - Every other week:  ESR and CRP What changed:  You were already taking a medication with the same name, and this prescription was added. Make sure you understand how and when to take each.   folic acid 768 MCG tablet Commonly known as:  FOLVITE Take 400 mcg by mouth 2 (two) times a week.   GLUCOSAMINE PO Take 1 tablet by mouth daily.   HYDROcodone-acetaminophen 5-325 MG tablet Commonly known as:  NORCO/VICODIN Take 1-2 tablets by mouth every 4 (four) hours as needed for moderate pain.   lisinopril 40 MG tablet Commonly known as:  ZESTRIL Take 20 mg by mouth 2 (two) times a day.   metoprolol succinate 50 MG 24 hr tablet Commonly known as:  TOPROL-XL TAKE 1 TABLET BY MOUTH DAILY. DUE FOR FOLLOW UP APPOINTMENT. KEEP OFFICE VISIT What changed:  See the new instructions.   THERAGRAN PO Take 1 tablet by mouth daily.   timolol 0.5 % ophthalmic solution Commonly known as:  TIMOPTIC Place 1 drop into the right eye 2 (two) times daily.   TYLENOL 500 MG tablet Generic drug:  acetaminophen Take 1,000 mg by mouth every 6 (six) hours as needed (for pain).   vitamin C 500 MG tablet Commonly known as:  ASCORBIC ACID Take 500 mg by mouth daily.   VITAMIN D-3 PO Take 1 capsule by mouth daily.            Home Infusion Instuctions  (From admission, onward)         Start     Ordered   11/15/18 0000  Home infusion instructions [facility] May follow [place] Dosing Protocol; May administer Cathflo as needed to maintain patency of vascular access device.; Flushing of vascular access device: per [facility] Protocol: 0.9% NaCl pre/post medica...    Question Answer Comment  Instructions May follow [place] Dosing Protocol    Instructions May administer Cathflo as needed to maintain patency of vascular access device.   Instructions Flushing of vascular access device: per [facility] Protocol: 0.9% NaCl pre/post medication administration and prn patency; Heparin 100 u/ml, 8m for implanted ports and Heparin 10u/ml, 540mfor all other central venous catheters.   Instructions May follow [facility] Anaphylaxis Protocol for First Dose Administration in the home: 0.9% NaCl at 25-50 ml/hr to maintain IV access for protocol meds. Epinephrine 0.3 ml IV/IM PRN and Benadryl 25-50 IV/IM PRN s/s of anaphylaxis.   Instructions [facility] Infusion Coordinator (RN) to assist per patient IV care needs in the home PRN.      11/15/18 1752         No Known Allergies Follow-up Information    Little, KeLennette BihariMD. Schedule an appointment as soon as possible for a visit.   Specialty:  Family Medicine Why:  hospital follow up  Contact information: [address] [place] [address] [phone]            The results of significant diagnostics from this hospitalization (including imaging, microbiology, ancillary and laboratory) are listed below for reference.    Significant Diagnostic Studies: Ct Angio Head W Or Wo Contrast  Result Date: 11/15/2018 CLINICAL DATA:  Slurred speech and aphasia. EXAM: CT ANGIOGRAPHY HEAD AND NECK TECHNIQUE: Multidetector CT imaging of the head and neck was performed using the standard protocol during bolus administration of intravenous contrast. Multiplanar CT image reconstructions and MIPs were obtained to evaluate the vascular anatomy. Carotid stenosis measurements (when applicable) are obtained utilizing NASCET criteria, using the distal internal carotid diameter as the denominator. CONTRAST:  27m OMNIPAQUE IOHEXOL 350 MG/ML SOLN COMPARISON:  MRI brain 11/14/2018 FINDINGS: CT HEAD FINDINGS Brain: There is no mass, hemorrhage or extra-axial collection. There is generalized atrophy without lobar  predilection. Ischemic the brain parenchyma is normal. Skull: The visualized skull base, calvarium and extracranial soft tissues are normal. Sinuses/Orbits: No fluid levels or advanced mucosal thickening of the visualized paranasal sinuses. No mastoid or middle ear effusion. The orbits are normal. CTA NECK FINDINGS SKELETON: There is no bony spinal canal stenosis. No lytic or blastic lesion. OTHER NECK: Normal pharynx, larynx and major salivary glands. No cervical lymphadenopathy. Unremarkable thyroid gland. Incidentally noted torus mandibularis. UPPER CHEST: No pneumothorax or pleural effusion. No nodules or masses. AORTIC ARCH: There is mild calcific atherosclerosis of the aortic arch. There is no aneurysm, dissection or hemodynamically significant stenosis of the visualized ascending aorta and aortic arch. Conventional 3 vessel aortic branching pattern. The visualized proximal subclavian arteries are widely patent. RIGHT CAROTID SYSTEM: --Common carotid artery: Widely patent origin without common carotid artery dissection or aneurysm. --Internal carotid artery: Normal without aneurysm, dissection or stenosis. --External carotid artery: No acute abnormality. LEFT CAROTID SYSTEM: --Common carotid artery: Widely patent origin without common carotid artery dissection or aneurysm. --Internal carotid artery: No dissection, occlusion or aneurysm. There is mixed density atherosclerosis extending into the proximal ICA, resulting in less than 50% stenosis. --External carotid artery: No acute abnormality. VERTEBRAL ARTERIES: Right dominant configuration. Both origins are normal. No dissection, occlusion or flow-limiting stenosis to the vertebrobasilar confluence. CTA HEAD FINDINGS POSTERIOR CIRCULATION: --Vertebral arteries: Proximal left V4 segment is occluded. Right vertebral artery is normal. --Posterior inferior cerebellar arteries (PICA): Both PICAs are patent and originate from the ipsilateral vertebral arteries.  --Anterior inferior cerebellar arteries (AICA): Normal right AICA origin. Left AICA not visualized. --Basilar artery: Normal. --Superior cerebellar arteries: Normal. --Posterior cerebral arteries (PCA): Normal. The left PCA is predominantly supplied by the posterior communicating artery. ANTERIOR CIRCULATION: --Intracranial internal carotid arteries: Atherosclerotic calcification of the internal carotid arteries at the skull base without hemodynamically significant stenosis. --Anterior cerebral arteries (ACA): Normal. Both A1 segments are present. Patent anterior communicating artery (a-comm). --Middle cerebral arteries (MCA): Normal. VENOUS SINUSES: As permitted by contrast timing, patent. ANATOMIC VARIANTS: Fetal origin of the left PCA. Review of the MIP images confirms the above findings. IMPRESSION: 1. No intracranial hemorrhage. 2. Occlusion of the proximal V4 segment of the non dominant left vertebral artery. Chronicity is unknown. Normal enhancement of the left PICA. 3. Atherosclerosis of both carotid bifurcations and the skull base segments of both internal carotid arteries without hemodynamically significant (greater than 50%) stenosis. 4. Aortic atherosclerosis (ICD10-I70.0). Electronically Signed   By: KUlyses JarredM.D.   On: 11/15/2018 13:59   Dg Chest 2 View  Result  Date: 11/14/2018 CLINICAL DATA:  Shortness of breath today. EXAM: CHEST - 2 VIEW COMPARISON:  PA and lateral chest 08/12/2004. Single-view of the chest 11/09/2018. FINDINGS: A few punctate calcified granulomata are seen in the left chest. Lungs otherwise clear. The patient is status post CABG and aortic valve replacement. Heart size is normal. No pneumothorax or pleural fluid. Remote right rib fractures noted. IMPRESSION: No acute disease. Electronically Signed   By: Inge Rise M.D.   On: 11/14/2018 19:50   Ct Angio Neck W Or Wo Contrast  Result Date: 11/15/2018 CLINICAL DATA:  Slurred speech and aphasia. EXAM: CT ANGIOGRAPHY  HEAD AND NECK TECHNIQUE: Multidetector CT imaging of the head and neck was performed using the standard protocol during bolus administration of intravenous contrast. Multiplanar CT image reconstructions and MIPs were obtained to evaluate the vascular anatomy. Carotid stenosis measurements (when applicable) are obtained utilizing NASCET criteria, using the distal internal carotid diameter as the denominator. CONTRAST:  107m OMNIPAQUE IOHEXOL 350 MG/ML SOLN COMPARISON:  MRI brain 11/14/2018 FINDINGS: CT HEAD FINDINGS Brain: There is no mass, hemorrhage or extra-axial collection. There is generalized atrophy without lobar predilection. Ischemic the brain parenchyma is normal. Skull: The visualized skull base, calvarium and extracranial soft tissues are normal. Sinuses/Orbits: No fluid levels or advanced mucosal thickening of the visualized paranasal sinuses. No mastoid or middle ear effusion. The orbits are normal. CTA NECK FINDINGS SKELETON: There is no bony spinal canal stenosis. No lytic or blastic lesion. OTHER NECK: Normal pharynx, larynx and major salivary glands. No cervical lymphadenopathy. Unremarkable thyroid gland. Incidentally noted torus mandibularis. UPPER CHEST: No pneumothorax or pleural effusion. No nodules or masses. AORTIC ARCH: There is mild calcific atherosclerosis of the aortic arch. There is no aneurysm, dissection or hemodynamically significant stenosis of the visualized ascending aorta and aortic arch. Conventional 3 vessel aortic branching pattern. The visualized proximal subclavian arteries are widely patent. RIGHT CAROTID SYSTEM: --Common carotid artery: Widely patent origin without common carotid artery dissection or aneurysm. --Internal carotid artery: Normal without aneurysm, dissection or stenosis. --External carotid artery: No acute abnormality. LEFT CAROTID SYSTEM: --Common carotid artery: Widely patent origin without common carotid artery dissection or aneurysm. --Internal carotid  artery: No dissection, occlusion or aneurysm. There is mixed density atherosclerosis extending into the proximal ICA, resulting in less than 50% stenosis. --External carotid artery: No acute abnormality. VERTEBRAL ARTERIES: Right dominant configuration. Both origins are normal. No dissection, occlusion or flow-limiting stenosis to the vertebrobasilar confluence. CTA HEAD FINDINGS POSTERIOR CIRCULATION: --Vertebral arteries: Proximal left V4 segment is occluded. Right vertebral artery is normal. --Posterior inferior cerebellar arteries (PICA): Both PICAs are patent and originate from the ipsilateral vertebral arteries. --Anterior inferior cerebellar arteries (AICA): Normal right AICA origin. Left AICA not visualized. --Basilar artery: Normal. --Superior cerebellar arteries: Normal. --Posterior cerebral arteries (PCA): Normal. The left PCA is predominantly supplied by the posterior communicating artery. ANTERIOR CIRCULATION: --Intracranial internal carotid arteries: Atherosclerotic calcification of the internal carotid arteries at the skull base without hemodynamically significant stenosis. --Anterior cerebral arteries (ACA): Normal. Both A1 segments are present. Patent anterior communicating artery (a-comm). --Middle cerebral arteries (MCA): Normal. VENOUS SINUSES: As permitted by contrast timing, patent. ANATOMIC VARIANTS: Fetal origin of the left PCA. Review of the MIP images confirms the above findings. IMPRESSION: 1. No intracranial hemorrhage. 2. Occlusion of the proximal V4 segment of the non dominant left vertebral artery. Chronicity is unknown. Normal enhancement of the left PICA. 3. Atherosclerosis of both carotid bifurcations and the skull base segments of both internal  carotid arteries without hemodynamically significant (greater than 50%) stenosis. 4. Aortic atherosclerosis (ICD10-I70.0). Electronically Signed   By: Ulyses Jarred M.D.   On: 11/15/2018 13:59   Mr Brain Wo Contrast  Addendum Date:  11/14/2018   ADDENDUM REPORT: 11/14/2018 18:03 ADDENDUM: Dr. Ree Kida called to discuss this case. The punctate white matter focus in the right posterior frontal region does not represent an acute infarction as there is no restricted diffusion on the ADC maps. I do not see an acute insult affecting this brain which shows extensive chronic microvascular change diffusely. Electronically Signed   By: Nelson Chimes M.D.   On: 11/14/2018 18:03   Result Date: 11/14/2018 CLINICAL DATA:  Speech abnormality rule out stroke EXAM: MRI HEAD WITHOUT CONTRAST TECHNIQUE: Multiplanar, multiecho pulse sequences of the brain and surrounding structures were obtained without intravenous contrast. COMPARISON:  None. FINDINGS: Brain: Small area of diffusion hyperintensity right parietal white matter measuring approximately 3 mm. Probable acute infarct. No other areas of acute infarction. Moderate atrophy. Moderate chronic microvascular ischemic changes throughout the white matter. Chronic infarct right cerebellum. Negative for hemorrhage mass or edema. Vascular: Normal arterial flow voids Skull and upper cervical spine: Negative Sinuses/Orbits: Mild mucosal edema in the paranasal sinuses. Probable chronic medial orbital fracture on the left. Bilateral cataract surgery Other: None IMPRESSION: Small acute infarct right parietal white matter Atrophy and moderate chronic ischemic change. Electronically Signed: By: Franchot Gallo M.D. On: 11/14/2018 16:43   Mr Lumbar Spine W Wo Contrast  Result Date: 11/10/2018 CLINICAL DATA:  85 y/o M; history of Enterococcus bacteremia and concern for discitis and/or possible prosthetic valve endocarditis. Severe pain of the lower abdomen and lower back. EXAM: MRI LUMBAR SPINE WITHOUT AND WITH CONTRAST TECHNIQUE: Multiplanar and multiecho pulse sequences of the lumbar spine were obtained without and with intravenous contrast. CONTRAST:  6 cc Gadavist COMPARISON:  11/09/2018 CT the abdomen and pelvis.  09/26/2018 lumbar spine MRI. FINDINGS: Segmentation:  Standard. Alignment:  Physiologic. Vertebrae: L2-3 diffuse vertebral body enhancement, rim enhancing collection within the intervertebral disc space, and opposing endplate erosive changes compatible with acute discitis osteomyelitis. Mild anterior loss of the L2 vertebral body height. The disc abscess extends into the right psoas muscle with there is an 11 mm collection (series 11, image 16). There is anterior epidural thickening and enhancement extending from L2-L4. There is decreased T1 signal and enhancement within the dorsal aspect of the L4 vertebral body likely representing developing osteomyelitis from contiguous spread. Conus medullaris and cauda equina: Conus extends to the L1 level. Conus and cauda equina appear normal. Paraspinal and other soft tissues: Extensive paravertebral edema at the level of discitis osteomyelitis. Diffuse enhancement of the right greater than left psoas muscles as well as the left-greater-than-right paraspinal muscles compatible with myositis. Disc levels: Stable multilevel disc and facet degenerative changes greatest at the L4-5 level with there is moderate multifactorial spinal canal stenosis and at the L5-S1 level with there is moderate bilateral neural foraminal stenosis. Increases spinal canal stenosis at the L2-3 and L3-4 levels. Spinal canal stenosis is mild L2-3 and moderate L3-4. IMPRESSION: 1. L2-3 acute discitis osteomyelitis. Erosive changes of opposing endplates and mild anterior loss of height of L2 vertebral body. 2. Anterior epidural inflammation from L2-L4 with soft tissue thickening, no discrete abscess at this time. Enhancement within dorsal L4 vertebral body likely represents developing osteomyelitis from epidural spread. 3. Small right psoas muscle abscess. Extensive inflammation of bilateral psoas muscles and lumbar paraspinal muscles likely representing a combination of  myositis and muscle strain. 4. Mild  L2-3 and moderate L3-4 spinal canal stenosis due to thickening of the anterior epidural soft tissues combined with lumbar spondylosis. 5. Stable background of lumbar spondylosis greatest at the L4-5 and L5-S1 levels. Electronically Signed   By: Kristine Garbe M.D.   On: 11/10/2018 01:30   Ct Abdomen Pelvis W Contrast  Result Date: 11/09/2018 CLINICAL DATA:  Pelvic pain, low back pain EXAM: CT ABDOMEN AND PELVIS WITH CONTRAST TECHNIQUE: Multidetector CT imaging of the abdomen and pelvis was performed using the standard protocol following bolus administration of intravenous contrast. CONTRAST:  38m OMNIPAQUE IOHEXOL 300 MG/ML  SOLN COMPARISON:  09/25/2018, 06/24/2012 FINDINGS: Lower chest: No acute abnormality. Redemonstrated lobulated 1.1 cm nodule of the right lower lobe, unchanged from recent prior examination and very slightly enlarged compared to remote prior examination dated 06/24/2012. Extensive 3 vessel coronary artery calcifications and/or stents, status post aortic valve replacement. Mild bibasilar fibrotic change. Hepatobiliary: No focal liver abnormality is seen. The gallbladder is distended with sludge. No gallbladder wall thickening, or biliary dilatation. Pancreas: Unremarkable. No pancreatic ductal dilatation or surrounding inflammatory changes. Spleen: Normal in size without focal abnormality. Adrenals/Urinary Tract: Adrenal glands are unremarkable. Kidneys are normal, without renal calculi, focal lesion, or hydronephrosis. Bladder is unremarkable. Stomach/Bowel: Stomach is within normal limits. Appendix appears normal. No evidence of bowel wall thickening, distention, or inflammatory changes. Moderate burden of stool in the left and right colon. Sigmoid diverticulosis. Vascular/Lymphatic: Severe calcific atherosclerosis. No enlarged abdominal or pelvic lymph nodes. Reproductive: Status post prostatectomy. Other: No abdominal wall hernia or abnormality. No abdominopelvic ascites.  Musculoskeletal: There is new erosion and sclerosis of the L2-L3 disc space and adjacent endplates with mild adjacent perivertebral fat stranding (series 6, image 61, series 7, image 23). IMPRESSION: 1. There is new erosion and sclerosis of the L2-L3 disc space and adjacent endplates with mild adjacent perivertebral fat stranding (series 6, image 61, series 7, image 23), consistent with discitis osteomyelitis. Consider contrast enhanced MRI to more sensitively evaluate for disc space and epidural abscess. 2. Redemonstrated lobulated 1.1 cm nodule of the right lower lobe, unchanged from recent prior examination and very slightly enlarged compared to remote prior examination dated 06/24/2012. As on prior examination, if clinically appropriate consider follow-up CT in 3-6 months to ensure stability. 3. Other chronic, incidental, and postoperative findings as detailed above. Electronically Signed   By: AEddie CandleM.D.   On: 11/09/2018 14:48   Dg Chest Port 1 View  Result Date: 11/09/2018 CLINICAL DATA:  Abdominal pain with concern for sepsis EXAM: PORTABLE CHEST 1 VIEW COMPARISON:  September 12, 2004 FINDINGS: There is a small calcified granuloma in the left base. There is no edema or consolidation. Heart size and pulmonary vascularity are normal. Patient is status post coronary artery bypass grafting. There is aortic atherosclerosis. No adenopathy. There old healed rib fractures on the right. IMPRESSION: Small calcified granuloma left lower lobe. No edema or consolidation. Heart size within normal limits. Postoperative changes noted. Aortic Atherosclerosis (ICD10-I70.0). Electronically Signed   By: WLowella GripIII M.D.   On: 11/09/2018 12:44   Vas [place] (at [place]  Result Date: 11/15/2018 Carotid Arterial Duplex Study  Examination Guidelines: A complete evaluation includes B-mode imaging, spectral Doppler, color Doppler, and power Doppler as needed of all accessible portions of each vessel.  Bilateral testing is considered an integral part of a complete examination. Limited examinations for reoccurring indications may be performed as noted.  Right  Carotid Findings: +----------+--------+--------+--------+-----------+--------+             PSV cm/s EDV cm/s Stenosis Describe    Comments  +----------+--------+--------+--------+-----------+--------+  CCA Prox   64       12                homogeneous           +----------+--------+--------+--------+-----------+--------+  CCA Distal 51       9                 homogeneous           +----------+--------+--------+--------+-----------+--------+  ICA Prox   56       16       1-39%    homogeneous           +----------+--------+--------+--------+-----------+--------+  ICA Distal 47       13                                      +----------+--------+--------+--------+-----------+--------+  ECA        70                                               +----------+--------+--------+--------+-----------+--------+ +----------+--------+-------+--------+-------------------+             PSV cm/s EDV cms Describe Arm Pressure (mmHG)  +----------+--------+-------+--------+-------------------+  Subclavian 103                                            +----------+--------+-------+--------+-------------------+ +---------+--------+--+--------+--+---------+  Vertebral PSV cm/s 47 EDV cm/s 12 Antegrade  +---------+--------+--+--------+--+---------+  Left Carotid Findings: +----------+--------+--------+--------+-----------+--------+             PSV cm/s EDV cm/s Stenosis Describe    Comments  +----------+--------+--------+--------+-----------+--------+  CCA Prox   114      13                homogeneous           +----------+--------+--------+--------+-----------+--------+  CCA Distal 79       13                homogeneous           +----------+--------+--------+--------+-----------+--------+  ICA Prox   97       26       1-39%    homogeneous            +----------+--------+--------+--------+-----------+--------+  ICA Distal 76       17                                      +----------+--------+--------+--------+-----------+--------+  ECA        92                                               +----------+--------+--------+--------+-----------+--------+ +----------+--------+--------+--------+-------------------+  Subclavian PSV cm/s EDV cm/s Describe Arm Pressure (mmHG)  +----------+--------+--------+--------+-------------------+  130                                             +----------+--------+--------+--------+-------------------+ +---------+--------+--+--------+--+---------+  Vertebral PSV cm/s 52 EDV cm/s 15 Antegrade  +---------+--------+--+--------+--+---------+  Summary: Right Carotid: Velocities in the right ICA are consistent with a 1-39% stenosis. Left Carotid: Velocities in the left ICA are consistent with a 1-39% stenosis. Vertebrals: Bilateral vertebral arteries demonstrate antegrade flow. *See table(s) above for measurements and observations.  Electronically signed by Antony Contras MD on 11/15/2018 at 1:29:00 PM.    Final    [place] Ekg Site Rite  Result Date: 11/12/2018 If Site Rite image not attached, placement could not be confirmed due to current cardiac rhythm.  [place] Ekg Site Rite  Result Date: 11/12/2018 If [facility] image not attached, placement could not be confirmed due to current cardiac rhythm.  Ir Lumbar Disc Aspiration W/img Guide  Result Date: 11/10/2018 INDICATION: Suspected discitis/osteomyelitis lumbar spine centered at L2-3. EXAM: LUMBAR L2-3 DISC ASPIRATION UNDER FLUOROSCOPY MEDICATIONS: Lidocaine 1% subcutaneous ANESTHESIA/SEDATION: Intravenous Fentanyl 42mg and Versed 1.556mwere administered as conscious sedation during continuous monitoring of the patient's level of consciousness and physiological / cardiorespiratory status by the radiology RN, with a total moderate sedation time of 10 minutes. PROCEDURE:  Informed written consent was obtained from the patient after a thorough discussion of the procedural risks, benefits and alternatives. All questions were addressed. Maximal Sterile Barrier Technique was utilized including caps, mask, sterile gowns, sterile gloves, sterile drape, hand hygiene and skin antiseptic. A timeout was performed prior to the initiation of the procedure. The appropriate interspace was identified under fluoroscopy, corresponding to previous cross-sectional imaging. An appropriate skin entry site was determined. After local infiltration with 1% lidocaine, a 17 gauge trocar needle was advanced into the interspace from right posterolateral extraforaminal approach. Needle tip position within the interspace confirmed on biplane images. 2 mL thin blood-tinged fluid were aspirated, sent for the requested laboratory studies. The patient tolerated the procedure well. FLUOROSCOPY TIME:  42 seconds; 42 mGy COMPLICATIONS: None immediate. IMPRESSION: Technically successful lumbar L2-3 disc aspiration under fluoroscopy. Electronically Signed   By: D Lucrezia Europe.D.   On: 11/10/2018 12:59    Microbiology: Recent Results (from the past 240 hour(s))  Blood Culture (routine x 2)     Status: None   Collection Time: 11/09/18 12:30 PM  Result Value Ref Range Status   Specimen Description   Final    BLOOD RIGHT ARM Performed at [facility] [place]  Special Requests   Final    BOTTLES DRAWN AEROBIC AND ANAEROBIC Blood Culture adequate volume Performed at [facility] [place]  Culture   Final    NO GROWTH 5 DAYS Performed at [facility] [place] [address]  Report Status 11/14/2018 FINAL  Final  Blood Culture (routine x 2)     Status: None   Collection Time: 11/09/18 12:45 PM  Result Value Ref Range Status   Specimen Description   Final    BLOOD LEFT ARM Performed  at [facility] [place]  Special Requests   Final    BOTTLES DRAWN AEROBIC AND ANAEROBIC Blood Culture adequate  volume Performed at [facility], [address], [place], [place] [address]    Culture   Final    NO GROWTH 5 DAYS Performed at [facility], [place] [address], [place], [place] [address]    Report Status 11/14/2018 FINAL  Final  SARS Coronavirus 2 (Hosp order,Performed in [facility] lab via [employer] [facility])     Status: None   Collection Time: 11/09/18 12:49 PM  Result Value Ref Range Status   SARS Coronavirus 2 ([employer] Now) NEGATIVE NEGATIVE Final    Comment: (NOTE) Interpretive Result Comment(s): COVID 19 Positive SARS CoV 2 target nucleic acids are DETECTED. The SARS CoV 2 RNA is generally detectable in upper and lower respiratory specimens during the acute phase of infection.  Positive results are indicative of active infection with SARS CoV 2.  Clinical correlation with patient history and other diagnostic information is necessary to determine patient infection status.  Positive results do not rule out bacterial infection or coinfection with other viruses. The expected result is Negative. COVID 19 Negative SARS CoV 2 target nucleic acids are NOT DETECTED. The SARS CoV 2 RNA is generally detectable in upper and lower respiratory specimens during the acute phase of infection.  Negative results do not preclude SARS CoV 2 infection, do not rule out coinfections with other pathogens, and should not be used as the sole basis for treatment or other patient management decisions.  Negative results must be combined with clinical  observations, patient history, and epidemiological information. The expected result is Negative. Invalid Presence or absence of SARS CoV 2 nucleic acids cannot be determined. Repeat testing was performed on the submitted specimen and repeated Invalid results were obtained.  If  clinically indicated, additional testing on a new specimen with an alternate test methodology [phone]) is advised.  The SARS CoV 2 RNA is generally detectable in upper and lower respiratory specimens during the acute phase of infection. The expected result is Negative. Fact Sheet for Patients:  GolfingFamily.no Fact Sheet for Healthcare Providers: https://www.hernandez-brewer.com/ This test is not yet approved or cleared by the [place] FDA and has been authorized for detection and/or diagnosis of SARS CoV 2 by FDA under an Emergency Use Authorization (EUA).  This EUA will remain in effect (meaning this test can be used) for the duration of the COVID19 d eclaration under Section 564(b)(1) of the Act, 21 U.S.C. section [phone] 3(b)(1), unless the authorization is terminated or revoked sooner. Performed at [facility], [address] [place], [place] [address]   Urine culture     Status: None   Collection Time: 11/09/18  2:42 PM  Result Value Ref Range Status   Specimen Description   Final    URINE, RANDOM Performed at [facility], [address] [place], [place] [address]    Special Requests   Final    NONE Performed at [facility], [address] [place], [place] [address]    Culture   Final    NO GROWTH Performed at [facility], [place] [address], [place], [place] [address]    Report Status 11/11/2018 FINAL  Final  Surgical PCR screen     Status: None   Collection Time: 11/10/18  5:58 AM  Result Value Ref Range Status   MRSA, PCR NEGATIVE NEGATIVE Final   Staphylococcus aureus NEGATIVE NEGATIVE Final    Comment: (NOTE) The Xpert SA Assay (FDA approved for NASAL specimens  in patients 54 years of age and older), is one component of a comprehensive surveillance program. It is not intended to diagnose infection nor to guide or monitor treatment. Performed at [facility], [place] [address], [place], [place] [address]   Aerobic/Anaerobic Culture (surgical/deep wound)     Status: None   Collection Time: 11/10/18 12:40 PM  Result Value Ref Range Status   Specimen Description NEEDLE ASPIRATE DISC  Final   Special Requests NONE  Final   Gram Stain   Final    RARE WBC PRESENT, PREDOMINANTLY PMN NO ORGANISMS SEEN    Culture   Final    No growth aerobically or anaerobically. Performed at [facility], [place] [address] [place], [place] [address]    Report Status 11/15/2018 FINAL  Final  SARS Coronavirus 2 (CEPHEID - Performed in [place] hospital lab), Hosp Order     Status: None   Collection Time: 11/14/18 12:56 PM  Result Value Ref Range Status   SARS Coronavirus 2 NEGATIVE NEGATIVE Final    Comment: (NOTE) If result is NEGATIVE SARS-CoV-2 target nucleic acids are NOT DETECTED. The SARS-CoV-2 RNA is generally detectable in upper and lower  respiratory specimens during the acute phase of infection. The lowest  concentration of SARS-CoV-2 viral copies this assay can detect is 250  copies / mL. A negative result does not preclude SARS-CoV-2 infection  and should not be used as the sole basis for treatment or other  patient management decisions.  A negative result may occur with  improper specimen collection / handling, submission of specimen other  than nasopharyngeal swab, presence of viral mutation(s) within the  areas targeted by this assay, and inadequate number of viral copies  (<250 copies / mL). A negative result must be combined with clinical  observations, patient history, and epidemiological information. If result is POSITIVE SARS-CoV-2 target nucleic acids are DETECTED. The SARS-CoV-2 RNA is generally detectable in upper and lower  respiratory specimens dur ing the acute phase of infection.  Positive  results are indicative of active infection with SARS-CoV-2.  Clinical  correlation with patient history and other diagnostic information is  necessary to  determine patient infection status.  Positive results do  not rule out bacterial infection or co-infection with other viruses. If result is PRESUMPTIVE POSTIVE SARS-CoV-2 nucleic acids MAY BE PRESENT.   A presumptive positive result was obtained on the submitted specimen  and confirmed on repeat testing.  While 2019 novel coronavirus  (SARS-CoV-2) nucleic acids may be present in the submitted sample  additional confirmatory testing may be necessary for epidemiological  and / or clinical management purposes  to differentiate between  SARS-CoV-2 and other Sarbecovirus currently known to infect humans.  If clinically indicated additional testing with an alternate test  methodology [phone]) is advised. The SARS-CoV-2 RNA is generally  detectable in upper and lower respiratory sp ecimens during the acute  phase of infection. The expected result is Negative. Fact Sheet for Patients:  StrictlyIdeas.no Fact Sheet for Healthcare Providers: BankingDealers.co.za This test is not yet approved or cleared by the [place] FDA and has been authorized for detection and/or diagnosis of SARS-CoV-2 by FDA under an Emergency Use Authorization (EUA).  This EUA will remain in effect (meaning this test can be used) for the duration of the COVID-19 declaration under Section 564(b)(1) of the Act, 21 U.S.C. section 360bbb-3(b)(1), unless the authorization is terminated or revoked sooner. Performed at [facility], [place]  [address], [place], [place] [address]      Labs: Basic Metabolic Panel: Recent Labs  Lab 11/10/18 0341 11/11/18 0415 11/12/18 0313 11/14/18 1244  NA 136 138 136 137  K 3.3* 3.6 3.8 3.8  CL 103 103 100 99  CO2 _0 GLUCOSE 89 104* 115* 117*  BUN _1 CREATININE 1.16 1.11 1.01 1.45*  CALCIUM 8.0* 8.3* 8.4* 8.9   Liver Function Tests: Recent Labs  Lab 11/11/18 0415  AST 27  ALT 17  ALKPHOS 98  BILITOT  0.4  PROT 5.3*  ALBUMIN 2.3*   No results for input(s): LIPASE, AMYLASE in the last 168 hours. No results for input(s): AMMONIA in the last 168 hours. CBC: Recent Labs  Lab 11/10/18 0341 11/11/18 0415 11/12/18 0313 11/14/18 1244  WBC 6.5 6.9 8.1 11.6*  NEUTROABS  --   --   --  9.3*  HGB 7.7* 8.3* 8.5* 8.6*  HCT 24.1* 25.9* 26.7* 27.9*  MCV 87.6 87.8 87.5 89.1  PLT 245 238 256 283   Cardiac Enzymes: No results for input(s): CKTOTAL, CKMB, CKMBINDEX, TROPONINI in the last 168 hours. BNP: BNP (last 3 results) No results for input(s): BNP in the last 8760 hours.  ProBNP (last 3 results) No results for input(s): PROBNP in the last 8760 hours.  CBG: No results for input(s): GLUCAP in the last 168 hours.     Signed:  Cristal Ford  [facility] 11/16/2018, 1:37 PM

## 2018-11-15 NOTE — Progress Notes (Signed)
Subjective: No complaints.   Objective: Current vital signs: BP (!) 111/51 (BP Location: Left Arm)   Pulse 83   Temp 98 F (36.7 C) (Axillary)   Resp 17   Ht 5\' 7"  (1.702 m)   Wt 83.5 kg   SpO2 98%   BMI 28.83 kg/m  Vital signs in last 24 hours: Temp:  [97.9 F (36.6 C)-99.1 F (37.3 C)] 98 F (36.7 C) (05/29 0319) Pulse Rate:  [59-111] 83 (05/29 0319) Resp:  [16-30] 17 (05/29 0319) BP: (98-180)/(51-94) 111/51 (05/29 0319) SpO2:  [95 %-100 %] 98 % (05/29 0319) Weight:  [83.5 kg] 83.5 kg (05/28 1452)  Intake/Output from previous day: 05/28 0701 - 05/29 0700 In: 550 [I.V.:350; IV Piggyback:200] Out: 320 [Urine:320] Intake/Output this shift: No intake/output data recorded. Nutritional status:  Diet Order            Diet Heart Room service appropriate? Yes; Fluid consistency: Thin  Diet effective now             HEENT: Hydro/AT Lungs: Respirations unlabored Ext: Warm and well-perfused  Neuro: Mental Status: Patient is awake, alert, oriented to person, place, month, year, and situation. Patient is able to give a clear and coherent history. No signs of aphasia or neglect Cranial Nerves: II: Visual Fields are full. Pupils are equal, round, and reactive to light.   III,IV, VI: EOMI without ptosis or diploplia.  V: Facial sensation is symmetric to temperature VII: Facial movement is symmetric.  VIII: hearing is intact to voice X: No hypophonia XI: Symmetric. XII: No lingual dysarthria Motor: Tone is normal. Bulk is normal. 5/5 in all four extremities.  Cerebellar: No ataxia noted   Lab Results: Results for orders placed or performed during the hospital encounter of 11/14/18 (from the past 48 hour(s))  Basic metabolic panel     Status: Abnormal   Collection Time: 11/14/18 12:44 PM  Result Value Ref Range   Sodium 137 135 - 145 mmol/L   Potassium 3.8 3.5 - 5.1 mmol/L   Chloride 99 98 - 111 mmol/L   CO2 26 22 - 32 mmol/L   Glucose, Bld 117 (H) 70 - 99 mg/dL    BUN 17 8 - 23 mg/dL   Creatinine, Ser 1.45 (H) 0.61 - 1.24 mg/dL   Calcium 8.9 8.9 - 10.3 mg/dL   GFR calc non Af Amer 43 (L) >60 mL/min   GFR calc Af Amer 49 (L) >60 mL/min   Anion gap 12 5 - 15    Comment: Performed at [facility] [address], [place], [place] [address]  CBC with Differential     Status: Abnormal   Collection Time: 11/14/18 12:44 PM  Result Value Ref Range   WBC 11.6 (H) 4.0 - 10.5 K/uL   RBC 3.13 (L) 4.22 - 5.81 MIL/uL   Hemoglobin 8.6 (L) 13.0 - 17.0 g/dL   HCT 27.9 (L) 39.0 - 52.0 %   MCV 89.1 80.0 - 100.0 fL   MCH 27.5 26.0 - 34.0 pg   MCHC 30.8 30.0 - 36.0 g/dL   RDW 14.2 11.5 - 15.5 %   Platelets 283 150 - 400 K/uL   nRBC 0.0 0.0 - 0.2 %   Neutrophils Relative % 81 %   Neutro Abs 9.3 (H) 1.7 - 7.7 K/uL   Lymphocytes Relative 12 %   Lymphs Abs 1.4 0.7 - 4.0 K/uL   Monocytes Relative 7 %   Monocytes Absolute 0.8 0.1 - 1.0 K/uL  Eosinophils Relative 0 %   Eosinophils Absolute 0.0 0.0 - 0.5 K/uL   Basophils Relative 0 %   Basophils Absolute 0.0 0.0 - 0.1 K/uL   Immature Granulocytes 0 %   Abs Immature Granulocytes 0.04 0.00 - 0.07 K/uL    Comment: Performed at [facility] [address], [place], [place] [address]  Protime-INR     Status: None   Collection Time: 11/14/18 12:44 PM  Result Value Ref Range   Prothrombin Time 14.6 11.4 - 15.2 seconds   INR 1.2 0.8 - 1.2    Comment: (NOTE) INR goal varies based on device and disease states. Performed at [facility], [place] [address], [place], [place] [address]   SARS Coronavirus 2 (CEPHEID - Performed in [facility] hospital lab), Hosp Order     Status: None   Collection Time: 11/14/18 12:56 PM  Result Value Ref Range   SARS Coronavirus 2 NEGATIVE NEGATIVE    Comment: (NOTE) If result is NEGATIVE SARS-CoV-2 target nucleic acids are NOT DETECTED. The SARS-CoV-2 RNA is generally detectable in upper and lower  respiratory specimens during the acute phase of infection. The  lowest  concentration of SARS-CoV-2 viral copies this assay can detect is 250  copies / mL. A negative result does not preclude SARS-CoV-2 infection  and should not be used as the sole basis for treatment or other  patient management decisions.  A negative result may occur with  improper specimen collection / handling, submission of specimen other  than nasopharyngeal swab, presence of viral mutation(s) within the  areas targeted by this assay, and inadequate number of viral copies  (<250 copies / mL). A negative result must be combined with clinical  observations, patient history, and epidemiological information. If result is POSITIVE SARS-CoV-2 target nucleic acids are DETECTED. The SARS-CoV-2 RNA is generally detectable in upper and lower  respiratory specimens dur ing the acute phase of infection.  Positive  results are indicative of active infection with SARS-CoV-2.  Clinical  correlation with patient history and other diagnostic information is  necessary to determine patient infection status.  Positive results do  not rule out bacterial infection or co-infection with other viruses. If result is PRESUMPTIVE POSTIVE SARS-CoV-2 nucleic acids MAY BE PRESENT.   A presumptive positive result was obtained on the submitted specimen  and confirmed on repeat testing.  While 2019 novel coronavirus  (SARS-CoV-2) nucleic acids may be present in the submitted sample  additional confirmatory testing may be necessary for epidemiological  and / or clinical management purposes  to differentiate between  SARS-CoV-2 and other Sarbecovirus currently known to infect humans.  If clinically indicated additional testing with an alternate test  methodology [phone]) is advised. The SARS-CoV-2 RNA is generally  detectable in upper and lower respiratory sp ecimens during the acute  phase of infection. The expected result is Negative. Fact Sheet for Patients:   StrictlyIdeas.no Fact Sheet for Healthcare Providers: BankingDealers.co.za This test is not yet approved or cleared by the [place] FDA and has been authorized for detection and/or diagnosis of SARS-CoV-2 by FDA under an Emergency Use Authorization (EUA).  This EUA will remain in effect (meaning this test can be used) for the duration of the COVID-19 declaration under Section 564(b)(1) of the Act, 21 U.S.C. section 360bbb-3(b)(1), unless the authorization is terminated or revoked sooner. Performed at [facility], [place] [address], [place],  [address]   Hemoglobin A1c     Status: None  Collection Time: 11/15/18  4:30 AM  Result Value Ref Range   Hgb A1c MFr Bld 5.5 4.8 - 5.6 %    Comment: (NOTE) Pre diabetes:          5.7%-6.4% Diabetes:              >6.4% Glycemic control for   <7.0% adults with diabetes    Mean Plasma Glucose 111.15 mg/dL    Comment: Performed at [facility] [address] [place], [place] [address]  Lipid panel     Status: Abnormal   Collection Time: 11/15/18  4:30 AM  Result Value Ref Range   Cholesterol 116 0 - 200 mg/dL   Triglycerides 127 <150 mg/dL   HDL 28 (L) >40 mg/dL   Total CHOL/HDL Ratio 4.1 RATIO   VLDL 25 0 - 40 mg/dL   LDL Cholesterol 63 0 - 99 mg/dL    Comment:        Total Cholesterol/HDL:CHD Risk Coronary Heart Disease Risk Table                     Men   Women  1/2 Average Risk   3.4   3.3  Average Risk       5.0   4.4  2 X Average Risk   9.6   7.1  3 X Average Risk  23.4   11.0        Use the calculated Patient Ratio above and the CHD Risk Table to determine the patient's CHD Risk.        ATP III CLASSIFICATION (LDL):  <100     mg/dL   Optimal  100-129  mg/dL   Near or Above                    Optimal  130-159  mg/dL   Borderline  160-189  mg/dL   High  >190     mg/dL   Very High Performed at [facility] [address] [place], [place]  [address]     Recent Results (from the past 240 hour(s))  Blood Culture (routine x 2)     Status: None   Collection Time: 11/09/18 12:30 PM  Result Value Ref Range Status   Specimen Description   Final    BLOOD RIGHT ARM Performed at [facility], [place] [place], [place] [address]    Special Requests   Final    BOTTLES DRAWN AEROBIC AND ANAEROBIC Blood Culture adequate volume Performed at [facility], [place] [place], [place] [address]    Culture   Final    NO GROWTH 5 DAYS Performed at [facility], [place] [address] [place], [place] [address]    Report Status 11/14/2018 FINAL  Final  Blood Culture (routine x 2)     Status: None   Collection Time: 11/09/18 12:45 PM  Result Value Ref Range Status   Specimen Description   Final    BLOOD LEFT ARM Performed at [facility], [address] [address] [place], [place] [address]    Special Requests   Final    BOTTLES DRAWN AEROBIC AND ANAEROBIC Blood Culture adequate volume Performed at [facility], [place] [address] [place], [place] [address]    Culture   Final    NO GROWTH 5 DAYS Performed at [facility], [place] [address], [place], [place]  [address]    Report Status 11/14/2018 FINAL  Final  SARS Coronavirus 2 (Hosp order,Performed in [facility] lab via [employer] ID)     Status: None   Collection Time: 11/09/18 12:49 PM  Result Value Ref Range Status   SARS Coronavirus 2 ([employer] ID Now) NEGATIVE NEGATIVE Final    Comment: (NOTE) Interpretive Result Comment(s): COVID 19 Positive SARS CoV 2 target nucleic acids are DETECTED. The SARS CoV 2 RNA is generally detectable in upper and lower respiratory specimens during the acute phase of infection.  Positive results are indicative of active infection with SARS CoV 2.  Clinical correlation with patient history and other diagnostic information is necessary to determine patient infection status.  Positive results do not rule out  bacterial infection or coinfection with other viruses. The expected result is Negative. COVID 19 Negative SARS CoV 2 target nucleic acids are NOT DETECTED. The SARS CoV 2 RNA is generally detectable in upper and lower respiratory specimens during the acute phase of infection.  Negative results do not preclude SARS CoV 2 infection, do not rule out coinfections with other pathogens, and should not be used as the sole basis for treatment or other patient management decisions.  Negative results must be combined with clinical  observations, patient history, and epidemiological information. The expected result is Negative. Invalid Presence or absence of SARS CoV 2 nucleic acids cannot be determined. Repeat testing was performed on the submitted specimen and repeated Invalid results were obtained.  If clinically indicated, additional testing on a new specimen with an alternate test methodology [phone]) is advised.  The SARS CoV 2 RNA is generally detectable in upper and lower respiratory specimens during the acute phase of infection. The expected result is Negative. Fact Sheet for Patients:  GolfingFamily.no Fact Sheet for Healthcare Providers: https://www.hernandez-brewer.com/ This test is not yet approved or cleared by the [place] FDA and has been authorized for detection and/or diagnosis of SARS CoV 2 by FDA under an Emergency Use Authorization (EUA).  This EUA will remain in effect (meaning this test can be used) for the duration of the COVID19 d eclaration under Section 564(b)(1) of the Act, 21 U.S.C. section [phone] 3(b)(1), unless the authorization is terminated or revoked sooner. Performed at [facility], [address] [place], [place] [address]   Urine culture     Status: None   Collection Time: 11/09/18  2:42 PM  Result Value Ref Range Status   Specimen Description   Final    URINE, RANDOM Performed at [facility], [place] [place], [place] [address]    Special Requests   Final    NONE Performed at [facility], [address] [place], [place] [address]    Culture   Final    NO GROWTH Performed at [facility], [place] [address] [place], [place] [address]    Report Status 11/11/2018 FINAL  Final  Surgical PCR screen     Status: None   Collection Time: 11/10/18  5:58 AM  Result Value Ref Range Status   MRSA, PCR NEGATIVE NEGATIVE Final   Staphylococcus aureus NEGATIVE NEGATIVE Final    Comment: (NOTE) The Xpert SA Assay (FDA approved for NASAL specimens in patients 75 years of age and older), is one component of a comprehensive surveillance program. It is not intended to diagnose infection nor to guide or monitor treatment. Performed at [facility], [place],  [place] [address]   Aerobic/Anaerobic Culture (surgical/deep wound)     Status: None (Preliminary result)   Collection Time: 11/10/18 12:40 PM  Result Value Ref Range Status   Specimen Description NEEDLE ASPIRATE DISC  Final   Special Requests NONE  Final   Gram Stain   Final    RARE WBC PRESENT, PREDOMINANTLY PMN NO ORGANISMS SEEN    Culture   Final    NO GROWTH 4 DAYS NO ANAEROBES ISOLATED; CULTURE IN PROGRESS FOR 5 DAYS Performed at [facility], [place] [address], [place], [place] [address]    Report Status PENDING  Incomplete  SARS Coronavirus 2 (CEPHEID - Performed in [facility] hospital lab), Hosp Order     Status: None   Collection Time: 11/14/18 12:56 PM  Result Value Ref Range Status   SARS Coronavirus 2 NEGATIVE NEGATIVE Final    Comment: (NOTE) If result is NEGATIVE SARS-CoV-2 target nucleic acids are NOT DETECTED. The SARS-CoV-2 RNA is generally detectable in upper and lower  respiratory specimens during the acute phase of infection. The lowest  concentration of SARS-CoV-2 viral copies this assay can detect is 250  copies / mL. A negative result does not  preclude SARS-CoV-2 infection  and should not be used as the sole basis for treatment or other  patient management decisions.  A negative result may occur with  improper specimen collection / handling, submission of specimen other  than nasopharyngeal swab, presence of viral mutation(s) within the  areas targeted by this assay, and inadequate number of viral copies  (<250 copies / mL). A negative result must be combined with clinical  observations, patient history, and epidemiological information. If result is POSITIVE SARS-CoV-2 target nucleic acids are DETECTED. The SARS-CoV-2 RNA is generally detectable in upper and lower  respiratory specimens dur ing the acute phase of infection.  Positive  results are indicative of active infection with SARS-CoV-2.  Clinical  correlation with patient history and other diagnostic information is  necessary to determine patient infection status.  Positive results do  not rule out bacterial infection or co-infection with other viruses. If result is PRESUMPTIVE POSTIVE SARS-CoV-2 nucleic acids MAY BE PRESENT.   A presumptive positive result was obtained on the submitted specimen  and confirmed on repeat testing.  While 2019 novel coronavirus  (SARS-CoV-2) nucleic acids may be present in the submitted sample  additional confirmatory testing may be necessary for epidemiological  and / or clinical management purposes  to differentiate between  SARS-CoV-2 and other Sarbecovirus currently known to infect humans.  If clinically indicated additional testing with an alternate test  methodology [phone] is advised. The SARS-CoV-2 RNA is generally  detectable in upper and lower respiratory sp ecimens during the acute  phase of infection. The expected result is Negative. Fact Sheet for Patients:  StrictlyIdeas.no Fact Sheet for Healthcare Providers: BankingDealers.co.za This test is not yet approved or cleared by  the [place] FDA and has been authorized for detection and/or diagnosis of SARS-CoV-2 by FDA under an Emergency Use Authorization (EUA).  This EUA will remain in effect (meaning this test can be used) for the duration of the COVID-19 declaration under Section 564(b)(1) of the Act, 21 U.S.C. section 360bbb-3(b)(1), unless the authorization is terminated or revoked sooner. Performed at [facility], [place] [address] [place], [place] [address]     Lipid Panel Recent Labs    11/15/18 0430  CHOL 116  TRIG 127  HDL 28*  CHOLHDL 4.1  VLDL  25  LDLCALC 63    Studies/Results: Dg Chest 2 View  Result Date: 11/14/2018 CLINICAL DATA:  Shortness of breath today. EXAM: CHEST - 2 VIEW COMPARISON:  PA and lateral chest 08/12/2004. Single-view of the chest 11/09/2018. FINDINGS: A few punctate calcified granulomata are seen in the left chest. Lungs otherwise clear. The patient is status post CABG and aortic valve replacement. Heart size is normal. No pneumothorax or pleural fluid. Remote right rib fractures noted. IMPRESSION: No acute disease. Electronically Signed   By: Inge Rise M.D.   On: 11/14/2018 19:50   Mr Brain Wo Contrast  Addendum Date: 11/14/2018   ADDENDUM REPORT: 11/14/2018 18:03 ADDENDUM: Dr. Ree Kida called to discuss this case. The punctate white matter focus in the right posterior frontal region does not represent an acute infarction as there is no restricted diffusion on the ADC maps. I do not see an acute insult affecting this brain which shows extensive chronic microvascular change diffusely. Electronically Signed   By: Nelson Chimes M.D.   On: 11/14/2018 18:03   Result Date: 11/14/2018 CLINICAL DATA:  Speech abnormality rule out stroke EXAM: MRI HEAD WITHOUT CONTRAST TECHNIQUE: Multiplanar, multiecho pulse sequences of the brain and surrounding structures were obtained without intravenous contrast. COMPARISON:  None. FINDINGS: Brain: Small area of diffusion  hyperintensity right parietal white matter measuring approximately 3 mm. Probable acute infarct. No other areas of acute infarction. Moderate atrophy. Moderate chronic microvascular ischemic changes throughout the white matter. Chronic infarct right cerebellum. Negative for hemorrhage mass or edema. Vascular: Normal arterial flow voids Skull and upper cervical spine: Negative Sinuses/Orbits: Mild mucosal edema in the paranasal sinuses. Probable chronic medial orbital fracture on the left. Bilateral cataract surgery Other: None IMPRESSION: Small acute infarct right parietal white matter Atrophy and moderate chronic ischemic change. Electronically Signed: By: Franchot Gallo M.D. On: 11/14/2018 16:43    Medications:  Scheduled: . aspirin EC  81 mg Oral Daily  . atorvastatin  40 mg Oral Daily  . calcium-vitamin D  2 tablet Oral Daily  . cholecalciferol  2,000 Units Oral Daily  . cyclobenzaprine  5 mg Oral BID  . enoxaparin (LOVENOX) injection  30 mg Subcutaneous Q12H  . folic acid  0.5 mg Oral Daily  . multivitamin with minerals  1 tablet Oral Daily  . timolol  1 drop Right Eye BID  . vitamin C  500 mg Oral Daily   Continuous: . sodium chloride 50 mL/hr at 11/14/18 2109  . ampicillin (OMNIPEN) IV 2 g (11/15/18 0553)  . cefTRIAXone (ROCEPHIN)  IV 2 g (11/14/18 2112)   Carotid ultrasound preliminary result: Right Carotid: Velocities in the right ICA are consistent with a 1-39% stenosis. Left Carotid: Velocities in the left ICA are consistent with a 1-39% stenosis. Vertebrals: Bilateral vertebral arteries demonstrate antegrade flow  Assessment: 85 year old male with transient dysarthria of unclear etiology.  MRI showed no acute infarction, with a punctate hyperintensity in the right frontal lobe representing T2-shine-through artifact. Although dysarthria is relatively nonspecific, this severity with otherwise clear sensorium is concerning for possible transient ischemic attack and he has multiple  risk factors for such.  It is reasonable to treated as such at this time. 1. TIA.  2. Vertebral osteomyelitis. He is on IV antibiotics for this.   Recommendations: - HgbA1c, fasting lipid panel - Frequent neuro checks - Echocardiogram pending - CTA of head and neck has been ordered. The latter is to assess for possible more proximal or distal stenosis (relative to the carotid  bulb) which may be missed on ultrasound - Prophylactic therapy- Aspirin - dose 325mg  PO or 300mg  PR - Risk factor modification - Telemetry monitoring - PT consult, OT consult, Speech consult    LOS: 1 day   @Electronically  signed: Dr. Kerney Elbe 11/15/2018  8:01 AM

## 2018-11-15 NOTE — TOC Initial Note (Addendum)
Transition of Care [facility]) - Initial/Assessment Note    Patient Details  Name: Randy Chen MRN: [id number] Date of Birth: 06-Oct-1927  Transition of Care [facility]) CM/SW Contact:    Marilu Favre, RN Phone Number: 11/15/2018, 10:55 AM  Clinical Narrative:                  Patient from [place]. Recently discharged back to his apartment with wife. On IV ABX at home.   Home Health agency is [facility] ( [place] ) Candi Leash with same aware of patient's admission. Mrs Marcelline Deist will notify Emmit Pomfret with [place] of admission.   Await PT/OT evaluations . If patient returns to [place] living at discharge will need resumption of care orders and OPAT script.  PT recommendations home health. Left Peggy at [place] a message.   Hoyle Sauer with [facility] aware possible discharge today.   Emmit Pomfret with [place] aware possible discharge today . She will need OPAT script , texted MD. Patient has antibiotics at home already.  Peggy with [facility] confirmed patient can go back to independent living  Expected Discharge Plan: [place] Barriers to Discharge: Continued Medical Work up   Patient Goals and CMS Choice Patient states their goals for this hospitalization and ongoing recovery are:: to return home  CMS Medicare.gov Compare Post Acute Care list provided to:: Patient Choice offered to / list presented to : Patient  Expected Discharge Plan and Services Expected Discharge Plan: [place]       Living arrangements for the past 2 months: Apartment Expected Discharge Date: 11/14/18                                    Prior Living Arrangements/Services Living arrangements for the past 2 months: Apartment Lives with:: Spouse                   Activities of Daily Living Home Assistive Devices/Equipment: None ADL  Screening (condition at time of admission) Patient's cognitive ability adequate to safely complete daily activities?: Yes Is the patient deaf or have difficulty hearing?: No Does the patient have difficulty seeing, even when wearing glasses/contacts?: No Does the patient have difficulty concentrating, remembering, or making decisions?: No Patient able to express need for assistance with ADLs?: Yes Does the patient have difficulty dressing or bathing?: No Independently performs ADLs?: Yes (appropriate for developmental age) Does the patient have difficulty walking or climbing stairs?: No Weakness of Legs: None Weakness of Arms/Hands: None  Permission Sought/Granted                  Emotional Assessment              Admission diagnosis:  Aphasia [R47.01] Acute ischemic stroke [facility]) [I63.9] Patient Active Problem List   Diagnosis Date Noted  . Acute CVA (cerebrovascular accident) ([place]) 11/14/2018  . CKD (chronic kidney disease), stage III ([place]) 11/14/2018  . Discitis 11/09/2018  . Osteomyelitis ([place]) 11/09/2018  . Acute lower UTI 09/26/2018  . Bacteremia due to Enterococcus   . AKI (acute kidney injury) ([place]) 09/25/2018  . Hypertension 09/25/2018  . History of atherosclerotic cardiovascular disease 09/25/2018  . Bladder retention 09/25/2018  . Chronic anemia 09/25/2018  . Hyponatremia 09/25/2018  . Pulmonary nodule 09/25/2018  . PAD (peripheral artery disease) ([place])  06/06/2017  . Fatigue 04/26/2017  . Claudication of both lower extremities ([place]) 04/26/2017  . Cellulitis of left lower leg 01/08/2015  . Cellulitis 01/08/2015  . S/P AVR 01/08/2015  . Chest discomfort 05/13/2014  . Chest pain 05/04/2014  . Diverticulosis 05/13/2013  . Vertigo 05/13/2013  . Colitis 06/23/2012  . Hx of CABG 11/18/2010  . History of prosthetic aortic valve 11/18/2010  . Benign hypertensive heart disease without heart failure 11/18/2010  . Hypercholesterolemia 11/18/2010  . History of  prostate cancer 11/18/2010   PCP:  [name], MD Pharmacy:   [facility] [place], [place] [address] Phone: [phone] Fax: [phone]  [id number] - [place], [place] - [address] AT [address] [place] [address] Phone: [phone] Fax: [phone]  [facility] [place], [place] [address] Phone: [phone] Fax: [phone]     Social Determinants of Health ([place]) Interventions    Readmission Risk Interventions Readmission Risk Prevention Plan 09/28/2018  Transportation Screening Complete  PCP or Specialist Appt within 5-7 Days Complete  Home Care Screening Complete  Medication Review (RN CM) Complete  Some recent data might be hidden

## 2018-11-15 NOTE — Progress Notes (Signed)
11/15/18 1152  PT Visit Information  Last PT Received On 11/15/18  Assistance Needed +1  History of Present Illness Pt is an 85 y/o male admitted secondary to transient dysarthria of unclear etiology. MRI negative for acute infarct, but showed hyperintensity at R parietal lobe. Thought to be possible TIA. Pt with recent admission for L2-3 osteomyelitis. PMH includes CAD s/p CABG, HTN, CKD, and PAD.   Precautions  Precautions Fall  Restrictions  Weight Bearing Restrictions No  Home Living  Family/patient expects to be discharged to: Private residence  Living Arrangements Spouse/significant other  Available Help at Discharge Available 24 hours/day  Type of Home Independent living facility [facility])  Home Access Level entry  Home Layout One level  Bathroom Shower/Tub Walk-in shower  [place] - 2 wheels  Prior Function  Level of [place] with assistive device(s)  Comments Uses RW for ambulation outside of home   Communication  Communication No difficulties  Pain Assessment  Pain Assessment 0-10  Pain Score 5  Pain Location back  Pain Descriptors / Indicators Sore  Pain Intervention(s) Limited activity within patient's tolerance;Monitored during session;Repositioned  Cognition  Arousal/Alertness Awake/alert  Behavior During Therapy WFL for tasks assessed/performed  Overall Cognitive Status Within Functional Limits for tasks assessed  Upper Extremity Assessment  Upper Extremity Assessment Defer to OT evaluation  Lower Extremity Assessment  Lower Extremity Assessment Generalized weakness  Cervical / Trunk Assessment  Cervical / Trunk Assessment Kyphotic  Bed Mobility  General bed mobility comments In recliner upon entry.   Transfers  Overall transfer level Needs assistance  Equipment used Rolling walker (2 wheeled)  Transfers Sit to/from Stand  Sit to Stand Supervision  General transfer comment Supervision  for safety.   Ambulation/Gait  Ambulation/Gait assistance Min guard  Gait Distance (Feet) 100 Feet  Assistive device Rolling walker (2 wheeled)  Gait Pattern/deviations Step-through pattern;Trunk flexed;Decreased stride length  General Gait Details Slow, overall steady gait with use of RW. Pt with flexed posture throughout secondary to back pain. Min guard for safety.   Gait velocity decreased  Balance  Overall balance assessment Needs assistance  Sitting-balance support No upper extremity supported;Feet supported  Sitting balance-Leahy Scale Good  Standing balance support Bilateral upper extremity supported;During functional activity  Standing balance-Leahy Scale Poor  Standing balance comment Reliant on UE support   PT - End of Session  Equipment Utilized During Treatment Gait belt  Activity Tolerance Patient tolerated treatment well  Patient left in chair;with call bell/phone within reach;with chair alarm set  Nurse Communication Mobility status  PT Assessment  PT Recommendation/Assessment Patient needs continued PT services  PT Visit Diagnosis Difficulty in walking, not elsewhere classified (R26.2);Pain  Pain - part of body  (back)  PT Problem List Decreased strength;Decreased balance;Pain;Decreased mobility;Decreased activity tolerance  PT Plan  PT Frequency (ACUTE ONLY) Min 3X/week  PT Treatment/Interventions (ACUTE ONLY) Functional mobility training;Balance training;Patient/family education;Gait training;Therapeutic activities;Therapeutic exercise;DME instruction  AM-PAC PT "6 Clicks" Mobility Outcome Measure (Version 2)  Help needed turning from your back to your side while in a flat bed without using bedrails? 3  Help needed moving from lying on your back to sitting on the side of a flat bed without using bedrails? 3  Help needed moving to and from a bed to a chair (including a wheelchair)? 3  Help needed standing up from a chair using your arms (e.g., wheelchair or bedside  chair)? 3  Help needed to walk in hospital room?  3  Help needed climbing 3-5 steps with a railing?  3  6 Click Score 18  Consider Recommendation of Discharge To: Home with [facility]  PT Recommendation  Follow Up Recommendations Home health PT;Supervision for mobility/OOB  PT equipment None recommended by PT  Individuals Consulted  Consulted and Agree with Results and Recommendations Patient  Acute Rehab PT Goals  Patient Stated Goal Return home to see wife today  PT Goal Formulation With patient  Time For Goal Achievement 11/29/18  Potential to Achieve Goals Good  PT Time Calculation  PT Start Time (ACUTE ONLY) 1149  PT Stop Time (ACUTE ONLY) 1204  PT Time Calculation (min) (ACUTE ONLY) 15 min  PT General Charges  $$ ACUTE PT VISIT 1 Visit  PT Evaluation  $PT Eval Low Complexity 1 Low   Pt admitted secondary to problem above with deficits below. Pt tolerated gait training well requiring min guard A with use of RW. Pt reports he had been working with [place] prior to admission; recommend continuation of HHPT at d/c. Reports wife can assist as needed. Will continue to follow acutely to maximize functional mobility independence and safety.   Leighton Ruff, PT, DPT  Acute Rehabilitation Services  Pager: [phone] Office: [phone]

## 2018-11-15 NOTE — Evaluation (Signed)
Occupational Therapy Evaluation Patient Details Name: Randy Chen MRN: [id number] DOB: 07/15/1927 Today's Date: 11/15/2018    History of Present Illness Pt is an 85 yo male s/p transient dysarthria, with MRI revealing small R parietal acute ischemic infarct. Pmhx: HLD, HTN, PVD, CAD, CABGx5 in '87.   Clinical Impression   PTA: Lives with spouse in [place]. Pt was receiving HH therapy. Pt presents with some weakness and incoordination of LLE, but pt with chronic back pain making movement more sore. Pt Performing ADL functional mobility in room with RW with minguardA. Pt supervisionA for grooming at sink. Pt seated for LB dress/bathe- pt with increased difficulty donning/doffing socks on LLE. Pt overall minA for ADL. OT to follow acutely for ADL retraining and energy conservation. Recommend: HHOT with 24/7 care.     Follow Up Recommendations  Home health OT;Supervision/Assistance - 24 hour    Equipment Recommendations  None recommended by OT    Recommendations for Other Services       Precautions / Restrictions Precautions Precautions: Fall Restrictions Weight Bearing Restrictions: No      Mobility Bed Mobility Overal bed mobility: Needs Assistance Bed Mobility: Supine to Sit     Supine to sit: Supervision     General bed mobility comments: supervisionA for safety  Transfers Overall transfer level: Needs assistance Equipment used: Rolling walker (2 wheeled) Transfers: Sit to/from Stand Sit to Stand: Supervision         General transfer comment: supervision from recliner, no physical assist needed    Balance Overall balance assessment: Needs assistance   Sitting balance-Leahy Scale: Good       Standing balance-Leahy Scale: Fair                             ADL either performed or assessed with clinical judgement   ADL Overall ADL's : Needs assistance/impaired Eating/Feeding: Independent   Grooming: Set up;Standing Grooming Details (indicate  cue type and reason): stood for grooming task Upper Body Bathing: Independent   Lower Body Bathing: Minimal assistance;Sitting/lateral leans;Sit to/from stand   Upper Body Dressing : Independent   Lower Body Dressing: Minimal assistance;Sitting/lateral leans;Sit to/from stand   Toilet Transfer: Min guard;Grab bars;Comfort height toilet   Toileting- Clothing Manipulation and Hygiene: Minimal assistance;Sitting/lateral lean;Sit to/from stand       Functional mobility during ADLs: Min guard;Rolling walker General ADL Comments: Pt reports being I with ADLs. Pt demonstrated MinA with LB dressing with increased time due to back pain in seated position.      Vision Baseline Vision/History: No visual deficits Vision Assessment?: No apparent visual deficits     Perception     Praxis      Pertinent Vitals/Pain Pain Assessment: 0-10 Pain Score: 5  Pain Location: back Pain Descriptors / Indicators: Sore Pain Intervention(s): Limited activity within patient's tolerance;Monitored during session;Repositioned     Hand Dominance Right   Extremity/Trunk Assessment Upper Extremity Assessment Upper Extremity Assessment: Overall WFL for tasks assessed   Lower Extremity Assessment Lower Extremity Assessment: Defer to PT evaluation;LLE deficits/detail LLE Deficits / Details: mild incoordination and weakness, could be low back pain related   Cervical / Trunk Assessment Cervical / Trunk Assessment: Kyphotic   Communication Communication Communication: No difficulties   Cognition Arousal/Alertness: Awake/alert Behavior During Therapy: WFL for tasks assessed/performed Overall Cognitive Status: Within Functional Limits for tasks assessed  General Comments       Exercises     Shoulder Instructions      Home Living Family/patient expects to be discharged to:: Private residence Living Arrangements: Spouse/significant  other Available Help at Discharge: Available 24 hours/day Type of Home: Independent living facility([place]) Home Access: Level entry     Home Layout: One level     Bathroom Shower/Tub: Occupational psychologist: Handicapped height     Home Equipment: Environmental consultant - 2 wheels   Additional Comments: walk in shower with handicapped height  Lives With: Spouse    Prior Functioning/Environment Level of Independence: Independent with assistive device(s)        Comments: Uses RW for ambulation outside of home         OT Problem List: Impaired balance (sitting and/or standing);Pain;Decreased activity tolerance;Decreased knowledge of use of DME or AE      OT Treatment/Interventions: Self-care/ADL training;Therapeutic exercise;DME and/or AE instruction;Therapeutic activities;Patient/family education;Balance training    OT Goals(Current goals can be found in the care plan section) Acute Rehab OT Goals Patient Stated Goal: Return home to see wife today OT Goal Formulation: With patient Time For Goal Achievement: 12/01/18 Potential to Achieve Goals: Good ADL Goals Pt Will Perform Lower Body Dressing: with modified independence;sitting/lateral leans;sit to/from stand;with adaptive equipment Pt Will Perform Toileting - Clothing Manipulation and hygiene: with modified independence;sitting/lateral leans;sit to/from stand Additional ADL Goal #1: Pt will perform ADL tasks at sink x10 mins with no LOB episodes exhibiting fair balance and good safety awareness with AE as needed  OT Frequency: Min 2X/week   Barriers to D/C:            Co-evaluation              AM-PAC OT "6 Clicks" Daily Activity     Outcome Measure Help from another person eating meals?: None Help from another person taking care of personal grooming?: None Help from another person toileting, which includes using toliet, bedpan, or urinal?: A Little Help from another person bathing (including washing,  rinsing, drying)?: A Little Help from another person to put on and taking off regular upper body clothing?: None Help from another person to put on and taking off regular lower body clothing?: A Little 6 Click Score: 21   End of Session Equipment Utilized During Treatment: Gait belt;Rolling walker Nurse Communication: Mobility status  Activity Tolerance: Patient tolerated treatment well Patient left: in chair;with call bell/phone within reach;with chair alarm set  OT Visit Diagnosis: Unsteadiness on feet (R26.81);Pain;Muscle weakness (generalized) (M62.81)                Time: 0254-2706 OT Time Calculation (min): 32 min Charges:  OT General Charges $OT Visit: 1 Visit OT Evaluation $OT Eval Moderate Complexity: 1 Mod OT Treatments $Self Care/Home Management : 8-22 mins  Ebony Hail Harold Hedge) Marsa Aris OTR/L Acute Rehabilitation Services Pager: [phone] Office: [place] 11/15/2018, 12:23 PM

## 2018-11-15 NOTE — Progress Notes (Signed)
  Echocardiogram 2D Echocardiogram has been performed.  Randy Chen 11/15/2018, 4:24 PM

## 2018-11-15 NOTE — Progress Notes (Signed)
patient d/c with single lumen picc in rt upper arm for long term abx / spinal bone infection

## 2018-11-15 NOTE — Plan of Care (Signed)
  Problem: Education: Goal: Knowledge of disease or condition will improve Outcome: Progressing Goal: Knowledge of secondary prevention will improve Outcome: Progressing Goal: Knowledge of patient specific risk factors addressed and post discharge goals established will improve Outcome: Progressing Goal: Individualized Educational Video(s) Outcome: Progressing   Problem: Self-Care: Goal: Ability to participate in self-care as condition permits will improve Outcome: Progressing Goal: Verbalization of feelings and concerns over difficulty with self-care will improve Outcome: Progressing Goal: Ability to communicate needs accurately will improve Outcome: Progressing   Problem: Nutrition: Goal: Dietary intake will improve Outcome: Progressing   Problem: Intracerebral Hemorrhage Tissue Perfusion: Goal: Complications of Intracerebral Hemorrhage will be minimized Outcome: Progressing   Problem: Ischemic Stroke/TIA Tissue Perfusion: Goal: Complications of ischemic stroke/TIA will be minimized Outcome: Progressing   Problem: Spontaneous Subarachnoid Hemorrhage Tissue Perfusion: Goal: Complications of Spontaneous Subarachnoid Hemorrhage will be minimized Outcome: Progressing

## 2018-11-15 NOTE — Consult Note (Addendum)
   [facility] CM Inpatient Consult   11/15/2018  ARJAY JASKIEWICZ Oct 17, 1927 [id number]    Patient evaluated forreadmission within 7 days and 30 days with 21% medium risk score for unplanned readmission and has 3hospitalizations in the past 6 months; andto check if potential [place] [facility] services are needed with his [employer] benefits.  Per MD's history and physical on5/28/20 reveals as follows:  NICKHOLAS GOLDSTON is an 85 y.o. male with a medical history of essential hypertension, coronary artery disease with history of CABG, hyperlipidemia, recently diagnosed osteomyelitis/discitis on ceftriaxone and ampicillin,  presented to the emergency department with complaints of slurred speech. Patient was recently discharged home on 11/13/2018 on IV antibiotics. (Acute CVA/ TIA,Vertebral osteomyelitis- on IV antibiotics for this)  Per chart review, patient is from [place] and lives with his wife.    Per transition of care CM note reviewed, patient will be going back to independent living with [facility] per PT/ OT recommendation. Home Health agency is [facility] ([place] [place] for home antibiotic treatment.    Called and spoke with patient over the hospital phone (HIPAA verified) to identify further discharge needs and he reports waiting to be discharge today. He indicated having no needs or issues with medication, pharmacy, transportation (wife drives). Wife Benjamine Mola) supports and assists with his needs.  However, patient agreed that he could benefit from [facility] calls to follow-up with his continued recovery.  Referral made for [facility] calls after discharge.  Patient's primary care provider isDr.Kevin Little with [place] [facility], listed as providing transition of care follow-up.   For questions and additional information, please call:   Elliett Guarisco A. Vidyuth Belsito, BSN, RN-BC [facility] Liaison Cell: [phone]

## 2018-11-15 NOTE — Plan of Care (Signed)
Adequate for discharge.

## 2018-11-15 NOTE — Evaluation (Signed)
Speech Language Pathology Evaluation Patient Details Name: Randy Chen MRN: [id number] DOB: 1928/04/07 Today's Date: 11/15/2018 Time: 9163-8466 SLP Time Calculation (min) (ACUTE ONLY): 10 min  Problem List:  Patient Active Problem List   Diagnosis Date Noted  . Acute CVA (cerebrovascular accident) ([place]) 11/14/2018  . CKD (chronic kidney disease), stage III ([place]) 11/14/2018  . Discitis 11/09/2018  . Osteomyelitis ([place]) 11/09/2018  . Acute lower UTI 09/26/2018  . Bacteremia due to Enterococcus   . AKI (acute kidney injury) ([place]) 09/25/2018  . Hypertension 09/25/2018  . History of atherosclerotic cardiovascular disease 09/25/2018  . Bladder retention 09/25/2018  . Chronic anemia 09/25/2018  . Hyponatremia 09/25/2018  . Pulmonary nodule 09/25/2018  . PAD (peripheral artery disease) ([place]) 06/06/2017  . Fatigue 04/26/2017  . Claudication of both lower extremities ([place]) 04/26/2017  . Cellulitis of left lower leg 01/08/2015  . Cellulitis 01/08/2015  . S/P AVR 01/08/2015  . Chest discomfort 05/13/2014  . Chest pain 05/04/2014  . Diverticulosis 05/13/2013  . Vertigo 05/13/2013  . Colitis 06/23/2012  . Hx of CABG 11/18/2010  . History of prosthetic aortic valve 11/18/2010  . Benign hypertensive heart disease without heart failure 11/18/2010  . Hypercholesterolemia 11/18/2010  . History of prostate cancer 11/18/2010   Past Medical History:  Past Medical History:  Diagnosis Date  . Aortic stenosis   . Cancer ([place])   . Coronary artery disease    CABG x5 - 1987  . History of atherosclerotic cardiovascular disease   . History of prostatectomy   . Hyperlipidemia   . Hypertension   . PAD (peripheral artery disease) ([place]) 06/06/2017   Past Surgical History:  Past Surgical History:  Procedure Laterality Date  . CARDIAC CATHETERIZATION     EF of 66%  . CARDIAC VALVE REPLACEMENT  2006  . CORONARY ARTERY BYPASS GRAFT  1987   x5  . I&D EXTREMITY Left 01/10/2015   Procedure:  DEBRIDEMENT ANKLE PLACEMENT ANTIBIOTIC BEADS AND WOUND VAC;  Surgeon: [name], MD;  Location: [place] [facility];  Service: Orthopedics;  Laterality: Left;  . IR LUMBAR DISC ASPIRATION W/IMG GUIDE  11/10/2018   HPI:  85 y.o. male with a medical history of essential hypertension, coronary artery disease with history of CABG, hyperlipidemia, recently diagnosed osteomyelitis/discitis, admitted with dysarthria.  MRI did not show acute infarct per second radiologist reading.   Assessment / Plan / Recommendation Clinical Impression  Pt presents with speech clarity that is approaching baseline - he describes his speech as 95% of normal.  Expressive and receptive language are WNL.  Expression is fluent, with occasional hesitations, deliberative output.  Cognition WNL with good insight.  No SLP f/u is recommended.  Our service will sign off.     SLP Assessment  SLP Recommendation/Assessment: Patient does not need any further Speech Lanaguage Pathology Services SLP Visit Diagnosis: Cognitive communication deficit (R41.841)    Follow Up Recommendations     n/a  Frequency and Duration           SLP Evaluation Cognition  Overall Cognitive Status: Within Functional Limits for tasks assessed Arousal/Alertness: Awake/alert Orientation Level: Oriented X4 Attention: Selective Selective Attention: Appears intact Memory: Appears intact       Comprehension  Auditory Comprehension Overall Auditory Comprehension: Appears within functional limits for tasks assessed Visual Recognition/Discrimination Discrimination: Within Function Limits Reading Comprehension Reading Status: Within funtional limits    Expression Expression Primary Mode of Expression: Verbal Verbal Expression Overall Verbal Expression: Appears within functional limits for tasks assessed  Oral / Motor  Oral Motor/Sensory Function Overall Oral Motor/Sensory Function: Within functional limits Motor Speech Overall Motor Speech: Appears  within functional limits for tasks assessed   GO                    Randy Chen 11/15/2018, 9:58 AM

## 2018-11-15 NOTE — Care Management Obs Status (Signed)
[place] NOTIFICATION   Patient Details  Name: Randy Chen MRN: [id number] Date of Birth: Oct 26, 1927   Medicare Observation Status Notification Given:  Yes    Marilu Favre, RN 11/15/2018, 2:35 PM

## 2018-11-15 NOTE — Progress Notes (Signed)
PHARMACY CONSULT NOTE FOR:  OUTPATIENT  PARENTERAL ANTIBIOTIC THERAPY (OPAT)  Indication: osteomyelitis, discitis Regimen: ampicillin 2gm IV q8h; rocephin 2gm IV q12h End date: 12/15/18  IV antibiotic discharge orders are pended. To discharging provider:  please sign these orders via discharge navigator,  Select New Orders & click on the button choice - Manage This Unsigned Work.     Thank you for allowing pharmacy to be a part of this patient's care.  Hildred Laser, PharmD Clinical Pharmacist **Pharmacist phone directory can now be found on [email or web address] ([email or web address]).  Listed under [place].

## 2018-11-15 NOTE — Discharge Instructions (Signed)
Transient Ischemic Attack  A transient ischemic attack (TIA) is a "warning stroke" that causes stroke-like symptoms that go away quickly. A TIA does not cause lasting damage to the brain. But having a TIA is a sign that you may be at risk for a stroke. Lifestyle changes and medical treatments can help prevent a stroke.  It is important to know the symptoms of a TIA and what to do. Get help right away, even if your symptoms go away. The symptoms of a TIA are the same as those of a stroke. They can happen fast, and they usually go away within minutes or hours. They can include:  · Weakness or loss of feeling in your face, arm, or leg. This often happens on one side of your body.  · Trouble walking.  · Trouble moving your arms or legs.  · Trouble talking or understanding what people are saying.  · Trouble seeing.  · Seeing two of one object (double vision).  · Feeling dizzy.  · Feeling confused.  · Loss of balance or coordination.  · Feeling sick to your stomach (nauseous) and throwing up (vomiting).  · A very bad headache for no reason.    What increases the risk?  Certain things may make you more likely to have a TIA. Some of these are things that you can change, such as:  · Being very overweight (obese).  · Using products that contain nicotine or tobacco, such as cigarettes and e-cigarettes.  · Taking birth control pills.  · Not being active.  · Drinking too much alcohol.  · Using drugs.  Other risk factors include:  · Having an irregular heartbeat (atrial fibrillation).  · Being African American or Hispanic.  · Having had blood clots, stroke, TIA, or heart attack in the past.  · Being a woman with a history of high blood pressure in pregnancy (preeclampsia).  · Being over the age of 60.  · Being male.  · Having family history of stroke.  · Having the following diseases or conditions:  ? High blood pressure.  ? High cholesterol.  ? Diabetes.  ? Heart disease.  ? Sickle cell disease.  ? Sleep apnea.  ? Migraine  headache.  ? Long-term (chronic) diseases that cause soreness and swelling (inflammation).  ? Disorders that affect how your blood clots.  Follow these instructions at home:  Medicines    · Take over-the-counter and prescription medicines only as told by your doctor.  · If you were told to take aspirin or another medicine to thin your blood, take it exactly as told by your doctor.  ? Taking too much of the medicine can cause bleeding.  ? Taking too little of the medicine may not work to treat the problem.  Eating and drinking    · Eat 5 or more servings of fruits and vegetables each day.  · Follow instructions from your doctor about your diet. You may need to follow a certain diet to help lower your risk of having a stroke. You may need to:  ? Eat a diet that is low in fat and salt.  ? Eat foods that contain a lot of fiber.  ? Limit the amount of carbohydrates and sugar in your diet.  · Limit alcohol intake to 1 drink a day for nonpregnant women and 2 drinks a day for men. One drink equals 12 oz of beer, 5 oz of wine, or 1½ oz of hard liquor.  General instructions  ·   Keep a healthy weight.  · Stay active. Try to get at least 30 minutes of activity on all or most days.  · Find out if you have a condition called sleep apnea. Get treatment if needed.  · Do not use any products that contain nicotine or tobacco, such as cigarettes and e-cigarettes. If you need help quitting, ask your doctor.  · Do not abuse drugs.  · Keep all follow-up visits as told by your doctor. This is important.  Get help right away if:  · You have any signs of stroke. "BE FAST" is an easy way to remember the main warning signs:  ? B - Balance. Signs are dizziness, sudden trouble walking, or loss of balance.  ? E - Eyes. Signs are trouble seeing or a sudden change in how you see.  ? F - Face. Signs are sudden weakness or loss of feeling of the face, or the face or eyelid drooping on one side.  ? A - Arms. Signs are weakness or loss of feeling in an  arm. This happens suddenly and usually on one side of the body.  ? S - Speech. Signs are sudden trouble speaking, slurred speech, or trouble understanding what people say.  ? T - Time. Time to call emergency services. Write down what time symptoms started.  · You have other signs of stroke, such as:  ? A sudden, very bad headache with no known cause.  ? Feeling sick to your stomach (nausea).  ? Throwing up (vomiting).  ? Jerky movements that you cannot control (seizure).  These symptoms may be an emergency. Do not wait to see if the symptoms will go away. Get medical help right away. Call your local emergency services (911 in the [place] Do not drive yourself to the hospital.  Summary  · A transient ischemic attack (TIA) is a "warning stroke" that causes stroke-like symptoms that go away quickly.  · A TIA is a medical emergency. Get help right away, even if your symptoms go away.  · A TIA does not cause lasting damage to the brain.  · Having a TIA is a sign that you may be at risk for a stroke. Lifestyle changes and medical treatments can help prevent a stroke.  This information is not intended to replace advice given to you by your health care provider. Make sure you discuss any questions you have with your health care provider.  Document Released: 03/14/2008 Document Revised: 12/11/2017 Document Reviewed: 09/06/2016  Elsevier Interactive Patient Education © 2019 Elsevier Inc.

## 2018-11-15 NOTE — Progress Notes (Signed)
Carotid duplex has been completed.   Preliminary results in CV Proc.   Abram Sander 11/15/2018 10:55 AM

## 2018-11-15 NOTE — Consult Note (Signed)
Neurology Consultation Reason for Consult: Dysarthria Referring Physician: Araceli Bouche  CC: Dysarthria  History is obtained from: Patient  HPI: Randy Chen is a 85 y.o. male with a history of hyperlipidemia, hypertension, peripheral artery disease, coronary artery disease who presents with transient dysarthria.  He states that he went to bed normal on 5/27, and then when he woke up on 5/28 he was very difficult to understand.  He had no numbness, no no weakness, or any other findings.  He is being treated for osteomyelitis with ceftriaxone and ampicillin.  This morning on arousing, he was very dysarthric and an MRI was performed which was initially interpreted as a small acute ischemic infarct.  By that time, his symptoms had almost completely cleared, but is admitted for presumed CVA.   LKW: Evening 527 tpa given?: no, outside of window    ROS: A 14 point ROS was performed and is negative except as noted in the HPI.  Past Medical History:  Diagnosis Date  . Aortic stenosis   . Cancer ([place])   . Coronary artery disease    CABG x5 - 1987  . History of atherosclerotic cardiovascular disease   . History of prostatectomy   . Hyperlipidemia   . Hypertension   . PAD (peripheral artery disease) ([place]) 06/06/2017     Family History  Problem Relation Age of Onset  . Heart disease Father   . Heart failure Mother   . Heart attack Brother   . Congestive Heart Failure Sister   . Congestive Heart Failure Sister      Social History:  reports that he quit smoking about 63 years ago. He has never used smokeless tobacco. He reports that he does not drink alcohol or use drugs.   Exam: Current vital signs: BP (!) 127/56 (BP Location: Left Arm)   Pulse 81   Temp 98.8 F (37.1 C) (Oral)   Resp 20   Ht 5\' 7"  (1.702 m)   Wt 83.5 kg   SpO2 96%   BMI 28.83 kg/m  Vital signs in last 24 hours: Temp:  [97.9 F (36.6 C)-99.1 F (37.3 C)] 98.8 F (37.1 C) (05/29 0022) Pulse Rate:   [59-111] 81 (05/29 0022) Resp:  [16-30] 20 (05/29 0022) BP: (98-180)/(51-94) 127/56 (05/29 0022) SpO2:  [95 %-100 %] 96 % (05/29 0022) Weight:  [83.5 kg] 83.5 kg (05/28 1452)   Physical Exam  Constitutional: Appears well-developed and well-nourished.  Psych: Affect appropriate to situation Eyes: No scleral injection HENT: No OP obstrucion Head: Normocephalic.  Cardiovascular: Normal rate and regular rhythm.  Respiratory: Effort normal, non-labored breathing GI: Soft.  No distension. There is no tenderness.  Skin: WDI  Neuro: Mental Status: Patient is awake, alert, oriented to person, place, month, year, and situation. Patient is able to give a clear and coherent history. No signs of aphasia or neglect Cranial Nerves: II: Visual Fields are full. Pupils are equal, round, and reactive to light.   III,IV, VI: EOMI without ptosis or diploplia.  V: Facial sensation is symmetric to temperature VII: Facial movement is symmetric.  VIII: hearing is intact to voice X: Uvula elevates symmetrically XI: Shoulder shrug is symmetric. XII: tongue is midline without atrophy or fasciculations.  Motor: Tone is normal. Bulk is normal. 5/5 strength was present in all four extremities.  Sensory: Sensation is symmetric to light touch and temperature in the arms and legs. Cerebellar: FNF and HKS are intact bilaterally  I have reviewed labs in epic and the results  pertinent to this consultation are: BMP-unremarkable  I have reviewed the images obtained: MRI brain-punctate area concerning for diffusion positivity in the right  Impression: 85 year old male with transient dysarthria of unclear etiology.  Though dysarthria is relatively nonspecific, this severity with otherwise clear sensorium is concerning for possible transient ischemic attack and he has multiple risk factors for such.  I do think it be reasonable to treated as such at this time.  Recommendations: - HgbA1c, fasting lipid panel -  Frequent neuro checks - Echocardiogram - Carotid dopplers - Prophylactic therapy-Antiplatelet med: Aspirin - dose 325mg  PO or 300mg  PR - Risk factor modification - Telemetry monitoring - PT consult, OT consult, Speech consult - Stroke team to follow  Roland Rack, MD [facility] [phone]  If 7pm- 7am, please page neurology on call as listed in [place].

## 2018-11-18 ENCOUNTER — Other Ambulatory Visit: Payer: Self-pay | Admitting: *Deleted

## 2018-11-18 DIAGNOSIS — G459 Transient cerebral ischemic attack, unspecified: Secondary | ICD-10-CM

## 2018-11-19 ENCOUNTER — Encounter: Payer: Self-pay | Admitting: Infectious Disease

## 2018-11-19 DIAGNOSIS — R7881 Bacteremia: Secondary | ICD-10-CM | POA: Diagnosis not present

## 2018-11-19 DIAGNOSIS — B952 Enterococcus as the cause of diseases classified elsewhere: Secondary | ICD-10-CM | POA: Diagnosis not present

## 2018-11-20 ENCOUNTER — Ambulatory Visit: Payer: Medicare Other | Admitting: Infectious Disease

## 2018-11-20 DIAGNOSIS — M4626 Osteomyelitis of vertebra, lumbar region: Secondary | ICD-10-CM | POA: Diagnosis not present

## 2018-11-20 DIAGNOSIS — Z952 Presence of prosthetic heart valve: Secondary | ICD-10-CM | POA: Diagnosis not present

## 2018-11-20 DIAGNOSIS — R911 Solitary pulmonary nodule: Secondary | ICD-10-CM | POA: Diagnosis not present

## 2018-11-20 DIAGNOSIS — E785 Hyperlipidemia, unspecified: Secondary | ICD-10-CM | POA: Diagnosis not present

## 2018-11-20 DIAGNOSIS — N183 Chronic kidney disease, stage 3 (moderate): Secondary | ICD-10-CM | POA: Diagnosis not present

## 2018-11-20 DIAGNOSIS — R7881 Bacteremia: Secondary | ICD-10-CM | POA: Diagnosis not present

## 2018-11-20 DIAGNOSIS — I739 Peripheral vascular disease, unspecified: Secondary | ICD-10-CM | POA: Diagnosis not present

## 2018-11-20 DIAGNOSIS — Z792 Long term (current) use of antibiotics: Secondary | ICD-10-CM | POA: Diagnosis not present

## 2018-11-20 DIAGNOSIS — B952 Enterococcus as the cause of diseases classified elsewhere: Secondary | ICD-10-CM | POA: Diagnosis not present

## 2018-11-20 DIAGNOSIS — M4640 Discitis, unspecified, site unspecified: Secondary | ICD-10-CM | POA: Diagnosis not present

## 2018-11-20 DIAGNOSIS — Z5181 Encounter for therapeutic drug level monitoring: Secondary | ICD-10-CM | POA: Diagnosis not present

## 2018-11-20 DIAGNOSIS — Z452 Encounter for adjustment and management of vascular access device: Secondary | ICD-10-CM | POA: Diagnosis not present

## 2018-11-20 DIAGNOSIS — I129 Hypertensive chronic kidney disease with stage 1 through stage 4 chronic kidney disease, or unspecified chronic kidney disease: Secondary | ICD-10-CM | POA: Diagnosis not present

## 2018-11-20 DIAGNOSIS — D649 Anemia, unspecified: Secondary | ICD-10-CM | POA: Diagnosis not present

## 2018-11-21 DIAGNOSIS — N390 Urinary tract infection, site not specified: Secondary | ICD-10-CM | POA: Diagnosis not present

## 2018-11-22 DIAGNOSIS — M4626 Osteomyelitis of vertebra, lumbar region: Secondary | ICD-10-CM | POA: Diagnosis not present

## 2018-11-25 ENCOUNTER — Other Ambulatory Visit: Payer: Self-pay

## 2018-11-25 DIAGNOSIS — M4626 Osteomyelitis of vertebra, lumbar region: Secondary | ICD-10-CM | POA: Diagnosis not present

## 2018-11-25 NOTE — Patient Outreach (Signed)
[facility]) Care Management  11/25/2018  Randy Chen 04/05/1928 [id number]  EMMI: general dischage Referral date: 11/25/18 Referral reason: wounds healing well: no Insurance: [employer] [place] [employer] Day # 1  Telephone call to patient regarding EMMI general discharge red alert. HIPAA verified with patient.  RNCM introduced herself and explained reason for call. Patient states he does not have wounds. Patient states he has a PICC line that is being attended to by the home infusion nurse. He reports his pick line is changed 1 time per week and his wife give him his antibiotics daily. Patient states the home health nurse is scheduled to come out today. Patient states he has his medications and takes them as prescribed.  He reports he has a follow up appointment with his primary MD on 11/27/18 and has a follow up scheduled with Dr. Tommy Chen and his cardiologist in 1 week. RNCM informed patient he is to have follow up with neurologist post discharge.  Patient verbalized understanding.   Patient states he has transportation to his doctors appointment.  Patient denies any residual from the recent TIA.  Patient reports his issues with constipation has resolved.  Patient denies any new symptoms since discharge from the hospital.  Patient states he lives at [facility].  He states the facility has a clinic on site.  Patient denies any further needs or concerns at this time. RNCM  Offered to mail patient information regarding [facility] care management. Patient verbally agreed.  Patient verbally agreed to ongoing EMMI automated calls.   RNCM discussed signs/ symptoms of stroke. Advised patient that 911 should be called for stroke like symptoms. Patient verbalized understanding.  RNCM advised patient to notify MD of any changes in condition prior to scheduled appointment. RNCM provided contact name and number: [phone] or main office number [phone] and 24 hour nurse advise  line [phone] by mail.  RNCM verified patient aware of 911 services for urgent/ emergent needs.  PLAN; RNCM will close due to patient being assessed and having no further needs. RNCM will send patient [facility] care management brochure / magnet   Quinn Plowman RN,BSN,CCM [facility] Telephonic  [phone]      PLAN:

## 2018-11-27 DIAGNOSIS — M4646 Discitis, unspecified, lumbar region: Secondary | ICD-10-CM | POA: Diagnosis not present

## 2018-11-27 DIAGNOSIS — D649 Anemia, unspecified: Secondary | ICD-10-CM | POA: Diagnosis not present

## 2018-11-27 DIAGNOSIS — I330 Acute and subacute infective endocarditis: Secondary | ICD-10-CM | POA: Diagnosis not present

## 2018-11-27 DIAGNOSIS — N390 Urinary tract infection, site not specified: Secondary | ICD-10-CM | POA: Diagnosis not present

## 2018-11-27 DIAGNOSIS — G459 Transient cerebral ischemic attack, unspecified: Secondary | ICD-10-CM | POA: Diagnosis not present

## 2018-12-02 ENCOUNTER — Telehealth: Payer: Self-pay | Admitting: Cardiovascular Disease

## 2018-12-02 DIAGNOSIS — N390 Urinary tract infection, site not specified: Secondary | ICD-10-CM | POA: Diagnosis not present

## 2018-12-02 NOTE — Telephone Encounter (Signed)
New message   Per Severiano Gilbert states that the patient refused physical therapy as of June 5th, 2020.

## 2018-12-02 NOTE — Telephone Encounter (Signed)
Noted...will forward to Penny Pia, MD.. OV 12/10/18 with Dr. Stanford Breed.

## 2018-12-03 ENCOUNTER — Encounter: Payer: Self-pay | Admitting: Infectious Disease

## 2018-12-03 ENCOUNTER — Ambulatory Visit: Payer: Medicare Other | Admitting: Infectious Disease

## 2018-12-03 ENCOUNTER — Other Ambulatory Visit: Payer: Self-pay

## 2018-12-03 VITALS — Wt 175.0 lb

## 2018-12-03 DIAGNOSIS — Z952 Presence of prosthetic heart valve: Secondary | ICD-10-CM | POA: Diagnosis not present

## 2018-12-03 DIAGNOSIS — R7881 Bacteremia: Secondary | ICD-10-CM

## 2018-12-03 DIAGNOSIS — M4647 Discitis, unspecified, lumbosacral region: Secondary | ICD-10-CM

## 2018-12-03 DIAGNOSIS — N183 Chronic kidney disease, stage 3 unspecified: Secondary | ICD-10-CM

## 2018-12-03 DIAGNOSIS — M4620 Osteomyelitis of vertebra, site unspecified: Secondary | ICD-10-CM

## 2018-12-03 DIAGNOSIS — G459 Transient cerebral ischemic attack, unspecified: Secondary | ICD-10-CM

## 2018-12-03 DIAGNOSIS — B952 Enterococcus as the cause of diseases classified elsewhere: Secondary | ICD-10-CM

## 2018-12-03 NOTE — Progress Notes (Signed)
RN called Sharrie Rothman at [facility] to relay verbal orders per Dr Tommy Medal.  Patient's IV ceftriaxone should stop today, IV ampicillin with new end date of 7/16.  Need to continue PICC care and labs with ampicillin.  Patient scheduled for MRI per DR Tommy Medal on 7/2, prior authorization obtained by [facility]. Patient aware of MRI appointment and location.  Sharrie Rothman will be able to access orders via EPIC if documented in office note. Patient scheduled to follow up at [facility] 7/15. Landis Gandy, RN

## 2018-12-03 NOTE — Progress Notes (Signed)
Subjective:   Chief complaint worsening back pain   Patient ID: Randy Chen, male    DOB: [birth year], 85 y.o.   MRN: [id number]  HPI  Randy Chen is a 85 y.o. male history of a prosthetic aortic valve who was admitted with ampicillin sensitive enterococcal bacteremia and severe low back pain.  He had a transthoracic echocardiogram while inpatient which did not show evidence of endocarditis. We did not pursue a transesophageal echocardiogram but instead pursued empiric treatment for bacterial endocarditis.  Note when he initially presented back in April he did have severe back pain that was being blamed on a urinary tract infection.  At the time I was highly skeptical that the amount of pain he had was due to urinary tract infection and we imaged his back with an MRI of his thoracic and lumbosacral area.  We found no pathology on imaging.  That being said I still had a high suspicion that he might have infection at a microscopic level that we could not seen with imaging.  Because of this and because we never excluded endocarditis of his prosthetic valve we had opted for 6-week course of dual beta-lactam therapy with ampicillin and ceftriaxone.  When I had seen the patient in clinic and gone back over his microbiology I was disconcerted to find that he had had repeatedly positive blood cultures with enterococcus prior to placement of his PICC line.  I saw him in clinic in follow-up and we had planned on completing a course of therapy.  However in the interim helped severe spasms of lower back pain precipitated when he had to go up to the bathroom at night.  He also was having pubic pain as well.  He was evaluated at [facility] and had a CT scan that did not show any evidence of intra-abdominal pathology but showed evidence of discitis and vertebral osteomyelitis.  He has been admitted to [facility] and undergone MRI which now shows   1. L2-3 acute  discitis osteomyelitis. Erosive changes of opposing endplates and mild anterior loss of height of L2 vertebral body. 2. Anterior epidural inflammation from L2-L4 with soft tissue thickening, no discrete abscess at this time. Enhancement within dorsal L4 vertebral body likely represents developing osteomyelitis from epidural spread. 3. Small right psoas muscle abscess. Extensive inflammation of bilateral psoas muscles and lumbar paraspinal muscles likely representing a combination of myositis and muscle strain. 4. Mild L2-3 and moderate L3-4 spinal canal stenosis due to thickening of the anterior epidural soft tissues combined with lumbar spondylosis. 5. Stable background of lumbar spondylosis greatest at the L4-5 and L5-S1 levels  Radiology have aspirated the disc space and sent for culture.  He was continued on dual beta-lactam therapy  He was originally told to complete his therapy for empiric treatment for prosthetic valve endocarditis today.  I saw him as an inpatient at [facility].  I thought that his ceftriaxone has been discontinued since we have achieved 6 weeks of antimicrobial therapy targeted at endocarditis but ultimately it seems that he was discharged on both ampicillin and ceftriaxone.  My partner Dr. Megan Salon had planned on giving him a total of 10 weeks of therapy for 6 weeks he had before +4 additional weeks.  Today however he is reporting worsening back pain versus how he was feeling a few days and weeks ago.  This is disconcerting given the findings found on MRI recently.  Therefore I will extend his ampicillin for an  additional month and obtain an MRI of his lumbosacral spine.    Past Medical History:  Diagnosis Date  . Aortic stenosis   . Cancer ([place])   . Coronary artery disease    CABG x5 - 1987  . History of atherosclerotic cardiovascular disease   . History of prostatectomy   . Hyperlipidemia   . Hypertension   . PAD (peripheral artery  disease) ([place]) 06/06/2017    Past Surgical History:  Procedure Laterality Date  . CARDIAC CATHETERIZATION     EF of 66%  . CARDIAC VALVE REPLACEMENT  2006  . CORONARY ARTERY BYPASS GRAFT  1987   x5  . I&D EXTREMITY Left 01/10/2015   Procedure: DEBRIDEMENT ANKLE PLACEMENT ANTIBIOTIC BEADS AND WOUND VAC;  Surgeon: [name], MD;  Location: [facility];  Service: Orthopedics;  Laterality: Left;  . IR LUMBAR DISC ASPIRATION W/IMG GUIDE  11/10/2018    Family History  Problem Relation Age of Onset  . Heart disease Father   . Heart failure Mother   . Heart attack Brother   . Congestive Heart Failure Sister   . Congestive Heart Failure Sister       Social History   Socioeconomic History  . Marital status: Married    Spouse name: Not on file  . Number of children: Not on file  . Years of education: Not on file  . Highest education level: Not on file  Occupational History  . Not on file  Social Needs  . Financial resource strain: Not on file  . Food insecurity    Worry: Not on file    Inability: Not on file  . Transportation needs    Medical: Not on file    Non-medical: Not on file  Tobacco Use  . Smoking status: Former Smoker    Quit date: 11/18/1955    Years since quitting: 63.0  . Smokeless tobacco: Never Used  Substance and Sexual Activity  . Alcohol use: No  . Drug use: No  . Sexual activity: Not on file  Lifestyle  . Physical activity    Days per week: Not on file    Minutes per session: Not on file  . Stress: Not on file  Relationships  . Social Herbalist on phone: Not on file    Gets together: Not on file    Attends religious service: Not on file    Active member of club or organization: Not on file    Attends meetings of clubs or organizations: Not on file    Relationship status: Not on file  Other Topics Concern  . Not on file  Social History Narrative  . Not on file    No Known Allergies   Current Outpatient Medications:  .   acetaminophen (TYLENOL) 500 MG tablet, Take 1,000 mg by mouth every 6 (six) hours as needed (for pain)., Disp: , Rfl:  .  amLODipine (NORVASC) 10 MG tablet, Take 1 tablet (10 mg total) by mouth daily., Disp: 30 tablet, Rfl: 0 .  ampicillin IVPB, Inject 2 g into the vein every 8 (eight) hours for 69 doses. Indication:  Discitis Last Day of Therapy:  12/05/2018 Labs - Once weekly:  CBC/D and BMP, Labs - Every other week:  ESR and CRP, Disp: 69 Units, Rfl: 0 .  ampicillin IVPB, Inject 2 g into the vein every 8 (eight) hours. Indication:  Osteomyelitis, discitis Last Day of Therapy: 12/05/18 Labs - Once weekly:  CBC/D and BMP, Labs -  Every other week:  ESR and CRP, Disp: 30 Units, Rfl: 0 .  aspirin EC 325 MG tablet, Take 1 tablet (325 mg total) by mouth daily for 30 days., Disp: 30 tablet, Rfl: 0 .  atorvastatin (LIPITOR) 40 MG tablet, Take 1 tablet (40 mg total) by mouth daily., Disp: 30 tablet, Rfl: 0 .  Calcium Carbonate (CALCIUM 600 PO), Take 600 mg by mouth daily., Disp: , Rfl:  .  Cholecalciferol (VITAMIN D-3 PO), Take 1 capsule by mouth daily., Disp: , Rfl:  .  folic acid (FOLVITE) 270 MCG tablet, Take 400 mcg by mouth 2 (two) times a week. , Disp: , Rfl:  .  Glucosamine HCl (GLUCOSAMINE PO), Take 1 tablet by mouth daily., Disp: , Rfl:  .  HYDROcodone-acetaminophen (NORCO/VICODIN) 5-325 MG tablet, Take 1-2 tablets by mouth every 4 (four) hours as needed for moderate pain., Disp: 30 tablet, Rfl: 0 .  lisinopril (ZESTRIL) 40 MG tablet, Take 20 mg by mouth 2 (two) times a day., Disp: , Rfl:  .  metoprolol succinate (TOPROL-XL) 50 MG 24 hr tablet, TAKE 1 TABLET BY MOUTH DAILY. DUE FOR FOLLOW UP APPOINTMENT. KEEP OFFICE VISIT (Patient taking differently: Take 50 mg by mouth daily. ), Disp: 90 tablet, Rfl: 3 .  Multiple Vitamin (THERAGRAN PO), Take 1 tablet by mouth daily., Disp: , Rfl:  .  timolol (TIMOPTIC) 0.5 % ophthalmic solution, Place 1 drop into the right eye 2 (two) times daily. , Disp: , Rfl: 3 .   vitamin C (ASCORBIC ACID) 500 MG tablet, Take 500 mg by mouth daily., Disp: , Rfl:     Review of Systems  Constitutional: Negative for activity change, appetite change, chills, diaphoresis, fatigue, fever and unexpected weight change.  HENT: Negative for congestion, rhinorrhea, sinus pressure, sneezing, sore throat and trouble swallowing.   Eyes: Negative for photophobia and visual disturbance.  Respiratory: Negative for cough, chest tightness, shortness of breath, wheezing and stridor.   Cardiovascular: Negative for chest pain, palpitations and leg swelling.  Gastrointestinal: Negative for abdominal distention, abdominal pain, anal bleeding, blood in stool, constipation, diarrhea, nausea and vomiting.  Genitourinary: Negative for difficulty urinating, dysuria, flank pain and hematuria.  Musculoskeletal: Positive for back pain. Negative for arthralgias, gait problem, joint swelling and myalgias.  Skin: Negative for color change, pallor, rash and wound.  Neurological: Negative for dizziness, tremors, weakness and light-headedness.  Hematological: Negative for adenopathy. Does not bruise/bleed easily.  Psychiatric/Behavioral: Negative for agitation, behavioral problems, confusion, decreased concentration, dysphoric mood and sleep disturbance.       Objective:   Physical Exam Constitutional:      Appearance: He is well-developed.  HENT:     Head: Normocephalic and atraumatic.  Eyes:     Conjunctiva/sclera: Conjunctivae normal.  Neck:     Musculoskeletal: Normal range of motion and neck supple.  Cardiovascular:     Rate and Rhythm: Normal rate and regular rhythm.     Heart sounds: No murmur. No friction rub. No gallop.   Pulmonary:     Effort: Pulmonary effort is normal. No respiratory distress.     Breath sounds: No wheezing.  Abdominal:     General: There is no distension.     Palpations: Abdomen is soft.  Musculoskeletal: Normal range of motion.        General: No tenderness.   Skin:    General: Skin is warm and dry.     Coloration: Skin is not pale.     Findings: No erythema or rash.  Neurological:     Mental Status: He is alert and oriented to person, place, and time.  Psychiatric:        Mood and Affect: Mood normal.        Behavior: Behavior normal.        Thought Content: Thought content normal.        Judgment: Judgment normal.    He does not seem to have much limitation in terms of movement of his hip joint no pain with internal or external rotation.  He does show me an area though in his lower back where he has pain particularly with standing and and sitting.       Assessment & Plan:   Ampicillin sensitive enterococcal bacteremia status post 6 weeks of IV antibiotics targeted at possible endocarditis given the presence of a prosthetic valve and the fact that he did not ever have a transesophageal echocardiogram during his initial admission.  He did have severe back pain which I suspected was due to discitis or vertebral osteomyelitis.  However initial MRI imaging did not show this.  He subsequently is been found to have vertebral osteomyelitis and discitis with potential beginnings of an epidural infection.  He has been on ampicillin and ceftriaxone though the latter was not intended by me.  I am concerned that he is having worsening back pain and I am worried that he may have developed an epidural abscess or psoas muscle abscess.  I will continue his antibiotics for additional month and obtain MRI of the lumbosacral spine in the interim.  I spent greater than 25 minutes with the patient including greater than 50% of time in face to face counsel of the patient re the management of vertebral osteomyelitis and diskitis  and in coordination of his care.

## 2018-12-04 ENCOUNTER — Telehealth: Payer: Self-pay | Admitting: Infectious Disease

## 2018-12-04 ENCOUNTER — Ambulatory Visit: Payer: Medicare Other | Admitting: Cardiology

## 2018-12-04 DIAGNOSIS — N390 Urinary tract infection, site not specified: Secondary | ICD-10-CM | POA: Diagnosis not present

## 2018-12-04 NOTE — Telephone Encounter (Signed)
Did this work?

## 2018-12-04 NOTE — Telephone Encounter (Signed)
[place], Daryll Drown, RN  Phone Number: [phone]        I pulled the office note and sent to pharmacy. I will send to [facility] home health as well and see if they are able to accept from the office note... if not, pharmacy can take the verbal and relay to them.. I know all home healths are different on what they will accept for orders but we will be sure to handle it.

## 2018-12-06 ENCOUNTER — Ambulatory Visit: Payer: Medicare Other | Admitting: Cardiology

## 2018-12-06 NOTE — Progress Notes (Signed)
HPI: Follow-up coronary artery disease and prior aortic valve replacement.  Patient is status post coronary artery bypass and graft in 1987 and had aortic valve replacement with a bioprosthetic valve in 2006.  Nuclear study December 2015 showed ejection fraction 70%.  Per Dr. Mare Ferrari there was no ischemia and he was treated medically. Patient has known peripheral vascular disease and was evaluated by Dr. Gwenlyn Found and medical therapy recommended.  Had enterococcal bacteremia April 2020.  Has been treated with antibiotics.  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  Had discitis May 2020.  Also noted to have right lower lobe nodule and follow-up recommended 3 to 6 months.  Echocardiogram April 2020 showed normal LV function, moderate left atrial enlargement, bioprosthetic aortic valve with no obvious vegetation and normal mean gradient.  Follow-up study May 2020 unchanged.  Carotid Dopplers May 2020 showed 1 to 39% bilateral stenosis.  CTA May 2020 showed occlusion of left vertebral artery and atherosclerosis but no significant obstruction of carotids.  Had TIA May 2020.  Patient continues on antibiotics at the direction of [name].  He has complained of continuing back pain and MRI is scheduled.  Since he was last seen there is no chest pain.  He does note some increased dyspnea on exertion.  No orthopnea or PND.  Minimal pedal edema.  He does have low back pain.  Current Outpatient Medications  Medication Sig Dispense Refill  . acetaminophen (TYLENOL) 500 MG tablet Take 1,000 mg by mouth every 6 (six) hours as needed (for pain).    [name] amLODipine (NORVASC) 10 MG tablet Take 1 tablet (10 mg total) by mouth daily. 30 tablet 0  . ampicillin IVPB Inject 2 g into the vein every 8 (eight) hours. Indication:  Osteomyelitis, discitis Last Day of Therapy: 12/05/18 Labs - Once weekly:  CBC/D and BMP, Labs - Every other week:  ESR and CRP 30 Units 0  . aspirin EC 325 MG tablet Take 1 tablet (325 mg  total) by mouth daily for 30 days. 30 tablet 0  . atorvastatin (LIPITOR) 40 MG tablet Take 1 tablet (40 mg total) by mouth daily. (Patient taking differently: Take 40 mg by mouth 2 (two) times a day. ) 30 tablet 0  . Calcium Carbonate (CALCIUM 600 PO) Take 600 mg by mouth daily.    . Cholecalciferol (VITAMIN D-3 PO) Take 1 capsule by mouth daily.    . folic acid (FOLVITE) 347 MCG tablet Take 400 mcg by mouth 2 (two) times a week.     . Glucosamine HCl (GLUCOSAMINE PO) Take 1 tablet by mouth daily.    [name] HYDROcodone-acetaminophen (NORCO/VICODIN) 5-325 MG tablet Take 1-2 tablets by mouth every 4 (four) hours as needed for moderate pain. 30 tablet 0  . lisinopril (ZESTRIL) 40 MG tablet Take 20 mg by mouth 2 (two) times a day.    . metoprolol succinate (TOPROL-XL) 50 MG 24 hr tablet TAKE 1 TABLET BY MOUTH DAILY. DUE FOR FOLLOW UP APPOINTMENT. KEEP OFFICE VISIT (Patient taking differently: Take 50 mg by mouth daily. ) 90 tablet 3  . Multiple Vitamin (THERAGRAN PO) Take 1 tablet by mouth daily.    . timolol (TIMOPTIC) 0.5 % ophthalmic solution Place 1 drop into the right eye 2 (two) times daily.   3  . vitamin C (ASCORBIC ACID) 500 MG tablet Take 500 mg by mouth daily.     No current facility-administered medications for this visit.      Past  Medical History:  Diagnosis Date  . Aortic stenosis   . Cancer ([place])   . Coronary artery disease    CABG x5 - 1987  . History of atherosclerotic cardiovascular disease   . History of prostatectomy   . Hyperlipidemia   . Hypertension   . PAD (peripheral artery disease) ([place]) 06/06/2017    Past Surgical History:  Procedure Laterality Date  . CARDIAC CATHETERIZATION     EF of 66%  . CARDIAC VALVE REPLACEMENT  2006  . CORONARY ARTERY BYPASS GRAFT  1987   x5  . I&D EXTREMITY Left 01/10/2015   Procedure: DEBRIDEMENT ANKLE PLACEMENT ANTIBIOTIC BEADS AND WOUND VAC;  Surgeon: [name], MD;  Location: [facility];  Service: Orthopedics;  Laterality: Left;  .  IR LUMBAR [place] W/IMG GUIDE  11/10/2018    Social History   Socioeconomic History  . Marital status: Married    Spouse name: Not on file  . Number of children: Not on file  . Years of education: Not on file  . Highest education level: Not on file  Occupational History  . Not on file  Social Needs  . Financial resource strain: Not on file  . Food insecurity    Worry: Not on file    Inability: Not on file  . Transportation needs    Medical: Not on file    Non-medical: Not on file  Tobacco Use  . Smoking status: Former Smoker    Quit date: 11/18/1955    Years since quitting: 63.1  . Smokeless tobacco: Never Used  Substance and Sexual Activity  . Alcohol use: No  . Drug use: No  . Sexual activity: Not on file  Lifestyle  . Physical activity    Days per week: Not on file    Minutes per session: Not on file  . Stress: Not on file  Relationships  . Social Herbalist on phone: Not on file    Gets together: Not on file    Attends religious service: Not on file    Active member of club or organization: Not on file    Attends meetings of clubs or organizations: Not on file    Relationship status: Not on file  . Intimate partner violence    Fear of current or ex partner: Not on file    Emotionally abused: Not on file    Physically abused: Not on file    Forced sexual activity: Not on file  Other Topics Concern  . Not on file  Social History Narrative  . Not on file    Family History  Problem Relation Age of Onset  . Heart disease Father   . Heart failure Mother   . Heart attack Brother   . Congestive Heart Failure Sister   . Congestive Heart Failure Sister     ROS: Low back pain but no fevers or chills, productive cough, hemoptysis, dysphasia, odynophagia, melena, hematochezia, dysuria, hematuria, rash, seizure activity, orthopnea, PND, pedal edema, claudication. Remaining systems are negative.  Physical Exam: Well-developed well-nourished in no  acute distress.  Skin is warm and dry.  HEENT is normal.  Neck is supple.  Chest is clear to auscultation with normal expansion.  Cardiovascular exam is regular rate and rhythm.  Abdominal exam nontender or distended. No masses palpated. Extremities show no edema. neuro grossly intact  Nov 13, 2017 electrocardiogram-sinus rhythm with PACs and PVC, nonspecific ST changes, personally reviewed.  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  He is followed by infectious disease.  I will arrange a transesophageal echocardiogram to assess his prosthetic aortic valve and also to rule out involvement of other valves.  He continues to complain of back pain and and has been diagnosed with discitis.  Follow-up MRI scheduled by infectious disease.  2 coronary artery disease status post coronary artery bypass graft-patient has not had chest pain.  Continue medical therapy with aspirin and statin.  3 hyperlipidemia-continue statin.  4 hypertension-patient's blood pressure is controlled.  Continue present medications and follow.  5 peripheral vascular disease-continue aspirin and statin.  6 lung nodule-needs noncontrast follow-up chest CT November.  Kirk Ruths, MD

## 2018-12-06 NOTE — H&P (View-Only) (Signed)
HPI: Follow-up coronary artery disease and prior aortic valve replacement.  Patient is status post coronary artery bypass and graft in 1987 and had aortic valve replacement with a bioprosthetic valve in 2006.  Nuclear study December 2015 showed ejection fraction 70%.  Per Dr. Mare Ferrari there was no ischemia and he was treated medically. Patient has known peripheral vascular disease and was evaluated by Dr. Gwenlyn Found and medical therapy recommended.  Had enterococcal bacteremia April 2020.  Has been treated with antibiotics.  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  Had discitis May 2020.  Also noted to have right lower lobe nodule and follow-up recommended 3 to 6 months.  Echocardiogram April 2020 showed normal LV function, moderate left atrial enlargement, bioprosthetic aortic valve with no obvious vegetation and normal mean gradient.  Follow-up study May 2020 unchanged.  Carotid Dopplers May 2020 showed 1 to 39% bilateral stenosis.  CTA May 2020 showed occlusion of left vertebral artery and atherosclerosis but no significant obstruction of carotids.  Had TIA May 2020.  Patient continues on antibiotics at the direction of [name].  He has complained of continuing back pain and MRI is scheduled.  Since he was last seen there is no chest pain.  He does note some increased dyspnea on exertion.  No orthopnea or PND.  Minimal pedal edema.  He does have low back pain.  Current Outpatient Medications  Medication Sig Dispense Refill  . acetaminophen (TYLENOL) 500 MG tablet Take 1,000 mg by mouth every 6 (six) hours as needed (for pain).    [name] amLODipine (NORVASC) 10 MG tablet Take 1 tablet (10 mg total) by mouth daily. 30 tablet 0  . ampicillin IVPB Inject 2 g into the vein every 8 (eight) hours. Indication:  Osteomyelitis, discitis Last Day of Therapy: 12/05/18 Labs - Once weekly:  CBC/D and BMP, Labs - Every other week:  ESR and CRP 30 Units 0  . aspirin EC 325 MG tablet Take 1 tablet (325 mg  total) by mouth daily for 30 days. 30 tablet 0  . atorvastatin (LIPITOR) 40 MG tablet Take 1 tablet (40 mg total) by mouth daily. (Patient taking differently: Take 40 mg by mouth 2 (two) times a day. ) 30 tablet 0  . Calcium Carbonate (CALCIUM 600 PO) Take 600 mg by mouth daily.    . Cholecalciferol (VITAMIN D-3 PO) Take 1 capsule by mouth daily.    . folic acid (FOLVITE) 289 MCG tablet Take 400 mcg by mouth 2 (two) times a week.     . Glucosamine HCl (GLUCOSAMINE PO) Take 1 tablet by mouth daily.    [name] HYDROcodone-acetaminophen (NORCO/VICODIN) 5-325 MG tablet Take 1-2 tablets by mouth every 4 (four) hours as needed for moderate pain. 30 tablet 0  . lisinopril (ZESTRIL) 40 MG tablet Take 20 mg by mouth 2 (two) times a day.    . metoprolol succinate (TOPROL-XL) 50 MG 24 hr tablet TAKE 1 TABLET BY MOUTH DAILY. DUE FOR FOLLOW UP APPOINTMENT. KEEP OFFICE VISIT (Patient taking differently: Take 50 mg by mouth daily. ) 90 tablet 3  . Multiple Vitamin (THERAGRAN PO) Take 1 tablet by mouth daily.    . timolol (TIMOPTIC) 0.5 % ophthalmic solution Place 1 drop into the right eye 2 (two) times daily.   3  . vitamin C (ASCORBIC ACID) 500 MG tablet Take 500 mg by mouth daily.     No current facility-administered medications for this visit.      Past  Medical History:  Diagnosis Date  . Aortic stenosis   . Cancer ([place])   . Coronary artery disease    CABG x5 - 1987  . History of atherosclerotic cardiovascular disease   . History of prostatectomy   . Hyperlipidemia   . Hypertension   . PAD (peripheral artery disease) ([place]) 06/06/2017    Past Surgical History:  Procedure Laterality Date  . CARDIAC CATHETERIZATION     EF of 66%  . CARDIAC VALVE REPLACEMENT  2006  . CORONARY ARTERY BYPASS GRAFT  1987   x5  . I&D EXTREMITY Left 01/10/2015   Procedure: DEBRIDEMENT ANKLE PLACEMENT ANTIBIOTIC BEADS AND WOUND VAC;  Surgeon: [name], MD;  Location: [facility];  Service: Orthopedics;  Laterality: Left;  .  IR LUMBAR [place] W/IMG GUIDE  11/10/2018    Social History   Socioeconomic History  . Marital status: Married    Spouse name: Not on file  . Number of children: Not on file  . Years of education: Not on file  . Highest education level: Not on file  Occupational History  . Not on file  Social Needs  . Financial resource strain: Not on file  . Food insecurity    Worry: Not on file    Inability: Not on file  . Transportation needs    Medical: Not on file    Non-medical: Not on file  Tobacco Use  . Smoking status: Former Smoker    Quit date: 11/18/1955    Years since quitting: 63.1  . Smokeless tobacco: Never Used  Substance and Sexual Activity  . Alcohol use: No  . Drug use: No  . Sexual activity: Not on file  Lifestyle  . Physical activity    Days per week: Not on file    Minutes per session: Not on file  . Stress: Not on file  Relationships  . Social Herbalist on phone: Not on file    Gets together: Not on file    Attends religious service: Not on file    Active member of club or organization: Not on file    Attends meetings of clubs or organizations: Not on file    Relationship status: Not on file  . Intimate partner violence    Fear of current or ex partner: Not on file    Emotionally abused: Not on file    Physically abused: Not on file    Forced sexual activity: Not on file  Other Topics Concern  . Not on file  Social History Narrative  . Not on file    Family History  Problem Relation Age of Onset  . Heart disease Father   . Heart failure Mother   . Heart attack Brother   . Congestive Heart Failure Sister   . Congestive Heart Failure Sister     ROS: Low back pain but no fevers or chills, productive cough, hemoptysis, dysphasia, odynophagia, melena, hematochezia, dysuria, hematuria, rash, seizure activity, orthopnea, PND, pedal edema, claudication. Remaining systems are negative.  Physical Exam: Well-developed well-nourished in no  acute distress.  Skin is warm and dry.  HEENT is normal.  Neck is supple.  Chest is clear to auscultation with normal expansion.  Cardiovascular exam is regular rate and rhythm.  Abdominal exam nontender or distended. No masses palpated. Extremities show no edema. neuro grossly intact  Nov 13, 2017 electrocardiogram-sinus rhythm with PACs and PVC, nonspecific ST changes, personally reviewed.  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  He is followed by infectious disease.  I will arrange a transesophageal echocardiogram to assess his prosthetic aortic valve and also to rule out involvement of other valves.  He continues to complain of back pain and and has been diagnosed with discitis.  Follow-up MRI scheduled by infectious disease.  2 coronary artery disease status post coronary artery bypass graft-patient has not had chest pain.  Continue medical therapy with aspirin and statin.  3 hyperlipidemia-continue statin.  4 hypertension-patient's blood pressure is controlled.  Continue present medications and follow.  5 peripheral vascular disease-continue aspirin and statin.  6 lung nodule-needs noncontrast follow-up chest CT November.  Kirk Ruths, MD

## 2018-12-09 DIAGNOSIS — R7881 Bacteremia: Secondary | ICD-10-CM | POA: Diagnosis not present

## 2018-12-10 ENCOUNTER — Encounter: Payer: Self-pay | Admitting: Cardiology

## 2018-12-10 ENCOUNTER — Ambulatory Visit (INDEPENDENT_AMBULATORY_CARE_PROVIDER_SITE_OTHER): Payer: Medicare Other | Admitting: Cardiology

## 2018-12-10 ENCOUNTER — Other Ambulatory Visit: Payer: Self-pay

## 2018-12-10 VITALS — BP 136/74 | HR 89 | Temp 97.7°F | Ht 67.0 in | Wt 168.0 lb

## 2018-12-10 DIAGNOSIS — I38 Endocarditis, valve unspecified: Secondary | ICD-10-CM | POA: Diagnosis not present

## 2018-12-10 MED ORDER — HYDROCODONE-ACETAMINOPHEN 5-325 MG PO TABS: 1.0000 | ORAL_TABLET | ORAL | 0 refills | Status: DC | PRN

## 2018-12-10 NOTE — Patient Instructions (Signed)
Medication Instructions:  NO CHANGE If you need a refill on your cardiac medications before your next appointment, please call your pharmacy.   Lab work: If you have labs (blood work) drawn today and your tests are completely normal, you will receive your results only by: [name] MyChart Message (if you have MyChart) OR . A paper copy in the mail If you have any lab test that is abnormal or we need to change your treatment, we will call you to review the results.  Testing/Procedures:  You are scheduled for a TEE on Wednesday 12-18-2018 with Dr. Marlou Porch.  Please arrive at the [facility] (Main Entrance A) at [facility]: [address] [place], [place] [address] at 10:30 am. (1 hour prior to procedure unless lab work is needed; if lab work is needed arrive 1.5 hours ahead)  DIET: Nothing to eat or drink after midnight except a sip of water with medications (see medication instructions below)  Medication Instructions: Do not take pain medication the morning of the procedure  Take all other medications with enough water to get them down  [place] Friday 12-13-2018 [facility] must have a responsible person to drive you home and stay in the waiting area during your procedure. Failure to do so could result in cancellation.  Bring your insurance cards.  *Special Note: Every effort is made to have your procedure done on time. Occasionally there are emergencies that occur at the hospital that may cause delays. Please be patient if a delay does occur.   Follow-Up: At [facility], you and your health needs are our priority.  As part of our continuing mission to provide you with exceptional heart care, we have created designated Provider Care Teams.  These Care Teams include your primary Cardiologist (physician) and Advanced Practice Providers (APPs -  Physician Assistants and Nurse Practitioners) who all work  together to provide you with the care you need, when you need it. . Your physician recommends that you schedule a follow-up appointment in:  3 months with dr Stanford Breed

## 2018-12-11 DIAGNOSIS — N390 Urinary tract infection, site not specified: Secondary | ICD-10-CM | POA: Diagnosis not present

## 2018-12-12 DIAGNOSIS — D649 Anemia, unspecified: Secondary | ICD-10-CM | POA: Diagnosis not present

## 2018-12-12 NOTE — Telephone Encounter (Signed)
Per Kari's last note, there was no issue. I sent her another message asking her to let us know.

## 2018-12-13 ENCOUNTER — Inpatient Hospital Stay (HOSPITAL_COMMUNITY): Admission: RE | Admit: 2018-12-13 | Payer: Medicare Other | Source: Ambulatory Visit

## 2018-12-13 NOTE — Progress Notes (Signed)
Mr. Thompson Covid 19 screen changed to 12/14/18.

## 2018-12-14 ENCOUNTER — Other Ambulatory Visit (HOSPITAL_COMMUNITY)
Admission: RE | Admit: 2018-12-14 | Discharge: 2018-12-14 | Disposition: A | Discharge status: Home / Self Care | Payer: Medicare Other | Source: Ambulatory Visit | Attending: Cardiology | Admitting: Cardiology

## 2018-12-14 DIAGNOSIS — Z1159 Encounter for screening for other viral diseases: Secondary | ICD-10-CM | POA: Diagnosis not present

## 2018-12-15 LAB — SARS CORONAVIRUS 2 (TAT 6-24 HRS): SARS Coronavirus 2: NEGATIVE

## 2018-12-16 ENCOUNTER — Telehealth: Payer: Self-pay | Admitting: *Deleted

## 2018-12-16 NOTE — Telephone Encounter (Signed)
Pt has been notified of negative Covid-19 test. Pt thanked me for the good news. Pt verified with me what time he is to arrive at [facility] for procedure . I stated to him reading the notes from the ov , he is to arrive 10:30 AM. Pt asked what medications was ok to take and not take. I read to the instructions he was given at 6/23 appt he is NOT to take any pain meds, however his other medications he may take with some water. Pt states he also does an IV infusion 3 x daily of Ampicillin. I advised the pt that I will have Dr. Jacalyn Lefevre nurse Hilda Blades call and advise about the ampicillin. Pt thanked me for the call and all of my help. I told him it was my pleasure.

## 2018-12-16 NOTE — Telephone Encounter (Signed)
Spoke with pt, aware infusion is fine.

## 2018-12-16 NOTE — Telephone Encounter (Signed)
Home health RN left message in triage that patient's home health and IV antibiotics orders have ended.  Per chart, [employer] was notified 6/16 to continue care through 7/16.  RN contacted Emmit Pomfret at [facility], left message asking her to please have end date corrected to 7/16 per Dr Derek Mound note. Landis Gandy, RN

## 2018-12-16 NOTE — Telephone Encounter (Signed)
-----   Message from Nuala Alpha, LPN sent at [id number]  2:20 PM EDT -----  ----- Message ----- From: Jerline Pain, MD Sent: 12/16/2018   2:17 PM EDT To: Rebeca Alert [address] Triage  Negative COVID-19 test.  Candee Furbish, MD

## 2018-12-18 ENCOUNTER — Other Ambulatory Visit: Payer: Self-pay

## 2018-12-18 ENCOUNTER — Encounter (HOSPITAL_COMMUNITY): Payer: Self-pay

## 2018-12-18 ENCOUNTER — Ambulatory Visit (HOSPITAL_COMMUNITY)
Admission: RE | Admit: 2018-12-18 | Discharge: 2018-12-18 | Disposition: A | Discharge status: Home / Self Care | Payer: Medicare Other | Attending: Cardiology | Admitting: Cardiology

## 2018-12-18 ENCOUNTER — Inpatient Hospital Stay (HOSPITAL_BASED_OUTPATIENT_CLINIC_OR_DEPARTMENT_OTHER)
Admission: RE | Admit: 2018-12-18 | Discharge: 2018-12-18 | Disposition: A | Discharge status: Home / Self Care | Payer: Medicare Other | Source: Ambulatory Visit | Attending: Cardiology | Admitting: Cardiology

## 2018-12-18 ENCOUNTER — Encounter (HOSPITAL_COMMUNITY)
Admission: RE | Disposition: A | Discharge status: Home / Self Care | Payer: Medicare Other | Source: Home / Self Care | Attending: Cardiology

## 2018-12-18 DIAGNOSIS — I340 Nonrheumatic mitral (valve) insufficiency: Secondary | ICD-10-CM | POA: Diagnosis not present

## 2018-12-18 DIAGNOSIS — I38 Endocarditis, valve unspecified: Secondary | ICD-10-CM

## 2018-12-18 DIAGNOSIS — R0609 Other forms of dyspnea: Secondary | ICD-10-CM | POA: Diagnosis not present

## 2018-12-18 DIAGNOSIS — Z951 Presence of aortocoronary bypass graft: Secondary | ICD-10-CM | POA: Diagnosis not present

## 2018-12-18 DIAGNOSIS — M545 Low back pain: Secondary | ICD-10-CM | POA: Diagnosis not present

## 2018-12-18 DIAGNOSIS — Z8673 Personal history of transient ischemic attack (TIA), and cerebral infarction without residual deficits: Secondary | ICD-10-CM | POA: Diagnosis not present

## 2018-12-18 DIAGNOSIS — I739 Peripheral vascular disease, unspecified: Secondary | ICD-10-CM | POA: Diagnosis not present

## 2018-12-18 DIAGNOSIS — Z87891 Personal history of nicotine dependence: Secondary | ICD-10-CM | POA: Diagnosis not present

## 2018-12-18 DIAGNOSIS — I2510 Atherosclerotic heart disease of native coronary artery without angina pectoris: Secondary | ICD-10-CM | POA: Insufficient documentation

## 2018-12-18 DIAGNOSIS — I10 Essential (primary) hypertension: Secondary | ICD-10-CM | POA: Insufficient documentation

## 2018-12-18 DIAGNOSIS — I350 Nonrheumatic aortic (valve) stenosis: Secondary | ICD-10-CM | POA: Insufficient documentation

## 2018-12-18 DIAGNOSIS — E785 Hyperlipidemia, unspecified: Secondary | ICD-10-CM | POA: Insufficient documentation

## 2018-12-18 DIAGNOSIS — N390 Urinary tract infection, site not specified: Secondary | ICD-10-CM | POA: Diagnosis not present

## 2018-12-18 DIAGNOSIS — I330 Acute and subacute infective endocarditis: Secondary | ICD-10-CM | POA: Insufficient documentation

## 2018-12-18 HISTORY — PX: TEE WITHOUT CARDIOVERSION: SHX5443

## 2018-12-18 SURGERY — ECHOCARDIOGRAM, TRANSESOPHAGEAL
Anesthesia: Moderate Sedation

## 2018-12-18 MED ORDER — BUTAMBEN-TETRACAINE-BENZOCAINE 2-2-14 % EX AERO
INHALATION_SPRAY | CUTANEOUS | Status: DC | PRN
  Administered 2018-12-18: 2 via TOPICAL

## 2018-12-18 MED ORDER — FENTANYL CITRATE (PF) 100 MCG/2ML IJ SOLN
INTRAMUSCULAR | Status: DC | PRN
  Administered 2018-12-18 (×2): 25 ug via INTRAVENOUS

## 2018-12-18 MED ORDER — FENTANYL CITRATE (PF) 100 MCG/2ML IJ SOLN
INTRAMUSCULAR | Status: AC
  Filled 2018-12-18: qty 2

## 2018-12-18 MED ORDER — MIDAZOLAM HCL (PF) 5 MG/ML IJ SOLN
INTRAMUSCULAR | Status: AC
  Filled 2018-12-18: qty 2

## 2018-12-18 MED ORDER — SODIUM CHLORIDE 0.9 % IV SOLN
INTRAVENOUS | Status: DC
  Administered 2018-12-18: 12:00:00 via INTRAVENOUS

## 2018-12-18 MED ORDER — MIDAZOLAM HCL (PF) 10 MG/2ML IJ SOLN
INTRAMUSCULAR | Status: DC | PRN
  Administered 2018-12-18 (×2): 2 mg via INTRAVENOUS

## 2018-12-18 NOTE — Interval H&P Note (Signed)
History and Physical Interval Note:  12/18/2018 11:05 AM  Randy Chen  has presented today for surgery, with the diagnosis of ENDOCARDITIS.  The various methods of treatment have been discussed with the patient and family. After consideration of risks, benefits and other options for treatment, the patient has consented to  Procedure(s): TRANSESOPHAGEAL ECHOCARDIOGRAM (TEE) (N/A) as a surgical intervention.  The patient's history has been reviewed, patient examined, no change in status, stable for surgery.  I have reviewed the patient's chart and labs.  Questions were answered to the patient's satisfaction.     [employer]

## 2018-12-18 NOTE — Discharge Instructions (Signed)

## 2018-12-18 NOTE — CV Procedure (Signed)
   Transesophageal Echocardiogram  Indications: Bacteremia  Time out performed  During this procedure the patient is administered a total of Versed 4 mg and Fentanyl 50 mcg to achieve and maintain moderate conscious sedation.  The patient's heart rate, blood pressure, and oxygen saturation are monitored continuously during the procedure. The period of conscious sedation is 20 minutes, of which I was present face-to-face 100% of this time.  Findings:  Left Ventricle: Normal EF 55%  Mitral Valve: Mild to moderate MR  Aortic Valve: bioprosthetic AVR - normal function  Tricuspid Valve: mild TR  Left Atrium: No LA thrombus  IMPRESSION: no endocarditis  Candee Furbish, MD

## 2018-12-19 DIAGNOSIS — M4626 Osteomyelitis of vertebra, lumbar region: Secondary | ICD-10-CM | POA: Diagnosis not present

## 2018-12-19 DIAGNOSIS — M48061 Spinal stenosis, lumbar region without neurogenic claudication: Secondary | ICD-10-CM | POA: Diagnosis not present

## 2018-12-19 DIAGNOSIS — R7881 Bacteremia: Secondary | ICD-10-CM | POA: Diagnosis not present

## 2018-12-19 DIAGNOSIS — M5126 Other intervertebral disc displacement, lumbar region: Secondary | ICD-10-CM | POA: Diagnosis not present

## 2018-12-23 ENCOUNTER — Telehealth: Payer: Self-pay

## 2018-12-23 ENCOUNTER — Encounter: Payer: Self-pay | Admitting: Infectious Disease

## 2018-12-23 DIAGNOSIS — Z952 Presence of prosthetic heart valve: Secondary | ICD-10-CM | POA: Diagnosis not present

## 2018-12-23 DIAGNOSIS — B952 Enterococcus as the cause of diseases classified elsewhere: Secondary | ICD-10-CM | POA: Diagnosis not present

## 2018-12-23 DIAGNOSIS — M4640 Discitis, unspecified, site unspecified: Secondary | ICD-10-CM | POA: Diagnosis not present

## 2018-12-23 DIAGNOSIS — N183 Chronic kidney disease, stage 3 (moderate): Secondary | ICD-10-CM | POA: Diagnosis not present

## 2018-12-23 DIAGNOSIS — Z792 Long term (current) use of antibiotics: Secondary | ICD-10-CM | POA: Diagnosis not present

## 2018-12-23 DIAGNOSIS — Z452 Encounter for adjustment and management of vascular access device: Secondary | ICD-10-CM | POA: Diagnosis not present

## 2018-12-23 DIAGNOSIS — D649 Anemia, unspecified: Secondary | ICD-10-CM | POA: Diagnosis not present

## 2018-12-23 DIAGNOSIS — I129 Hypertensive chronic kidney disease with stage 1 through stage 4 chronic kidney disease, or unspecified chronic kidney disease: Secondary | ICD-10-CM | POA: Diagnosis not present

## 2018-12-23 DIAGNOSIS — M4626 Osteomyelitis of vertebra, lumbar region: Secondary | ICD-10-CM | POA: Diagnosis not present

## 2018-12-23 DIAGNOSIS — Z5181 Encounter for therapeutic drug level monitoring: Secondary | ICD-10-CM | POA: Diagnosis not present

## 2018-12-23 DIAGNOSIS — Z79899 Other long term (current) drug therapy: Secondary | ICD-10-CM | POA: Diagnosis not present

## 2018-12-23 DIAGNOSIS — I739 Peripheral vascular disease, unspecified: Secondary | ICD-10-CM | POA: Diagnosis not present

## 2018-12-23 DIAGNOSIS — R7881 Bacteremia: Secondary | ICD-10-CM | POA: Diagnosis not present

## 2018-12-23 NOTE — Telephone Encounter (Signed)
Received call from Dr. Eddie Dibbles office requesting recent labs. Last labs [facility] has on file is 6/15 with Hgb of 7.2. Spoke with Cassie who states the Hgb drop is not related to antibiotic therapy. Labs drawn today at Dr. Eddie Dibbles office and PCP will fax to [place]. Routing to MD to make aware.  Eugenia Mcalpine, LPN

## 2018-12-24 NOTE — Telephone Encounter (Signed)
Thank you! He has a history of low hemoglobin as well. Highly likely not due to ampicillin.

## 2018-12-25 DIAGNOSIS — N390 Urinary tract infection, site not specified: Secondary | ICD-10-CM | POA: Diagnosis not present

## 2018-12-26 ENCOUNTER — Telehealth: Payer: Self-pay | Admitting: *Deleted

## 2018-12-26 NOTE — Telephone Encounter (Signed)
Thank you for all of your work on this.  :)  Hope you enjoyed your trip. I got a fax in triage asking for prior authorization for his medication/care.  Was this already done and we received a courtesy copy? Thank you,  Sharyn Lull ===View-only below this line=== ----- Message ----- From: Emmit Pomfret Sent: 12/18/2018   7:44 AM EDT To: Landis Gandy, RN Subject: RE: update on patient's medicaitons fo rhome*  Darlyn Chamber!  Just quick update but wanted to assure you we worked everything out with [facility] for Mr. Kilfoyle.  We had the information needed to continue his care and simply needed to be faxed to [facility].  It was verbally shared but they hadn't received our fax.  All necessary information has been shared with them and a process established to assure going forward- if they are lacking something, to call my cell phone or pharmacy cell phone.  I apologize if caused frustration to you and/or the patient.   I shared with all HH yesterday but please add this number to our information on file as well.  Claire Shown is our Director of Pharmacy- orders can always be put in EPIC and I can pull them.  If MD hasn't entered yet into EPIC, verbals can be accepted through [facility] at [phone].  Whichever is more efficient for your office works.  Thanks again and call me if need anything else   ----- Message ----- From: Landis Gandy, RN Sent: 12/09/2018   9:07 AM EDT To: Emmit Pomfret Subject: update on patient's medicaitons fo rhome hea*  Just wanted to check in and see if anything else needed sent anywhere for Mr. Settlemire?  THanks, Sharrie Rothman!  Sharyn Lull  ----- Message ----- From: Emmit Pomfret Sent: 12/03/2018  11:04 PM EDT To: Landis Gandy, RN  I pulled the office note and sent to pharmacy.  I will send to [facility] health as well and see if they are able to accept from the office note... if not, pharmacy can take the verbal and relay to them.. I know all home healths are different on  what they will accept for orders but we will be sure to handle it.

## 2018-12-30 DIAGNOSIS — R7881 Bacteremia: Secondary | ICD-10-CM | POA: Diagnosis not present

## 2018-12-30 DIAGNOSIS — M4626 Osteomyelitis of vertebra, lumbar region: Secondary | ICD-10-CM | POA: Diagnosis not present

## 2018-12-30 DIAGNOSIS — M4640 Discitis, unspecified, site unspecified: Secondary | ICD-10-CM | POA: Diagnosis not present

## 2018-12-31 ENCOUNTER — Telehealth: Payer: Self-pay | Admitting: Infectious Disease

## 2018-12-31 NOTE — Progress Notes (Signed)
Randy Chen had an MRI done but unfortunately not in our healthcare system but rather at [facility].  I am not sure if they were able to really compare the scan to the ones he has had done here at [facility].  They found evidence of L2-L3 discitis and osteomyelitis with myositis in the psoas muscles some severe spinal canal stenosis he also sought found some ventral epidural enhancement at the L2 L4 levels and dorsal epidural enhancement at L3-L4 that may indicate a phlegmon versus epidural abscess.  Is Randy Chen still on intravenous antibiotics however they have been discontinued.  If he has finished them I would like to switch him over to oral antibiotics and reassess him in the clinic in person.  I really wish this can be done with our own healthcare system to facilitate proper comparisons between different imaging.  Can someone check on whether he is on IV antibiotics now and how he is doing as far as his back pain and whether he has any weakness in his lower extremities

## 2018-12-31 NOTE — Telephone Encounter (Signed)
Per Randy Chen called the patient and he advised he is still on IV medication and he is having back pain but no weakness in his lower extremities.   Patient has a follow up visit here tomorrow 01/01/19.

## 2019-01-01 ENCOUNTER — Other Ambulatory Visit: Payer: Self-pay

## 2019-01-01 ENCOUNTER — Encounter: Payer: Self-pay | Admitting: Infectious Disease

## 2019-01-01 ENCOUNTER — Telehealth: Payer: Self-pay

## 2019-01-01 ENCOUNTER — Ambulatory Visit: Payer: Medicare Other | Admitting: Infectious Disease

## 2019-01-01 VITALS — BP 155/85 | HR 88 | Temp 98.2°F | Wt 167.0 lb

## 2019-01-01 DIAGNOSIS — Z952 Presence of prosthetic heart valve: Secondary | ICD-10-CM

## 2019-01-01 DIAGNOSIS — R7881 Bacteremia: Secondary | ICD-10-CM

## 2019-01-01 DIAGNOSIS — M4646 Discitis, unspecified, lumbar region: Secondary | ICD-10-CM | POA: Diagnosis not present

## 2019-01-01 DIAGNOSIS — M4620 Osteomyelitis of vertebra, site unspecified: Secondary | ICD-10-CM | POA: Diagnosis not present

## 2019-01-01 DIAGNOSIS — N183 Chronic kidney disease, stage 3 unspecified: Secondary | ICD-10-CM

## 2019-01-01 DIAGNOSIS — B952 Enterococcus as the cause of diseases classified elsewhere: Secondary | ICD-10-CM

## 2019-01-01 MED ORDER — AMOXICILLIN 500 MG PO CAPS: 500.0000 mg | ORAL_CAPSULE | Freq: Three times a day (TID) | ORAL | 3 refills | Status: DC

## 2019-01-01 NOTE — Progress Notes (Signed)
Subjective:   Chief complaint back pain at night   Patient ID: Randy Chen, male    DOB: 1928/06/02, 85 y.o.   MRN: [id number]  HPI  Randy Chen is a 85 y.o. male history of a prosthetic aortic valve who was admitted with ampicillin sensitive enterococcal bacteremia and severe low back pain.  He had a transthoracic echocardiogram while inpatient which did not show evidence of endocarditis. We did not pursue a transesophageal echocardiogram but instead pursued empiric treatment for bacterial endocarditis.  Note when he initially presented back in April he did have severe back pain that was being blamed on a urinary tract infection.  At the time I was highly skeptical that the amount of pain he had was due to urinary tract infection and we imaged his back with an MRI of his thoracic and lumbosacral area.  We found no pathology on imaging.  That being said I still had a high suspicion that he might have infection at a microscopic level that we could not seen with imaging.  Because of this and because we never excluded endocarditis of his prosthetic valve we had opted for 6-week course of dual beta-lactam therapy with ampicillin and ceftriaxone.  When I had seen the patient in clinic and gone back over his microbiology I was disconcerted to find that he had had repeatedly positive blood cultures with enterococcus prior to placement of his PICC line.  I saw him in clinic in follow-up and we had planned on completing a course of therapy.  However in the interim helped severe spasms of lower back pain precipitated when he had to go up to the bathroom at night.  He also was having pubic pain as well.  He was evaluated at [facility] and had a CT scan that did not show any evidence of intra-abdominal pathology but showed evidence of discitis and vertebral osteomyelitis.  He has been admitted to [facility] and undergone MRI which now shows   1. L2-3 acute  discitis osteomyelitis. Erosive changes of opposing endplates and mild anterior loss of height of L2 vertebral body. 2. Anterior epidural inflammation from L2-L4 with soft tissue thickening, no discrete abscess at this time. Enhancement within dorsal L4 vertebral body likely represents developing osteomyelitis from epidural spread. 3. Small right psoas muscle abscess. Extensive inflammation of bilateral psoas muscles and lumbar paraspinal muscles likely representing a combination of myositis and muscle strain. 4. Mild L2-3 and moderate L3-4 spinal canal stenosis due to thickening of the anterior epidural soft tissues combined with lumbar spondylosis. 5. Stable background of lumbar spondylosis greatest at the L4-5 and L5-S1 levels  Radiology have aspirated the disc space and sent for culture.  He was continued on dual beta-lactam therapy  He was originally told to complete his therapy for empiric treatment for prosthetic valve endocarditis today.  I saw him as an inpatient at [facility].  I thought that his ceftriaxone has been discontinued since we have achieved 6 weeks of antimicrobial therapy targeted at endocarditis but ultimately it seems that he was discharged on both ampicillin and ceftriaxone.  My partner Dr. Megan Salon had planned on giving him a total of 10 weeks of therapy for 6 weeks he had before +4 additional weeks.  When I last saw him in June he was having worsening back pain versus how he was feeling a few days and weeks prior  This is disconcerting given the findings found on MRI recently.  Therefore I  extended  his ampicillin for an additional month and obtained an MRI of his lumbosacral spine.  MRI was done at [facility] facility on July 2 and compared to an April scan at [facility] that did not show facet pathology, it is unfortunate this was not compared to a May scan which did show the known discitis and osteomyelitis including a psoas muscle abscess.   Its difficult for me to interpret the findings of this MRI since is not compared to the one in May.   IMPRESSION: 1. L2-L3 discitis-osteomyelitis with myositis of the adjacent psoas muscles. Severe spinal canal stenosis and bilateral neural foraminal stenosis secondary to bulging of the disc space. 2. Ventral epidural enhancement at the L2-4 levels and dorsal epidural enhancement at the L3-4 level may indicate phlegmon/developing epidural abscess. It is possible that the ventral epidural abnormality is due to engorgement of the lumbar venous plexus. 3. Moderate bilateral L4 neural foraminal stenosis.  The patient himself feels better.  His wife accompanies him states that he is walking more during the day eating more and his strength is improved.  He has less back pain during the day.  However at night he does have back pain that goes up to a 7 out of 10 in severity and makes it difficult for him to sleep.  He has some numbness in his legs and restless legs associated with this pain.  He is able to sleep if he takes an opioid pain medication at night.  Pain is certainly not worse than a month ago but still continues.  He does not have any lower extremity weakness whatsoever and as mentioned is actually improving in strength  Past Medical History:  Diagnosis Date  . Aortic stenosis   . Cancer ([place])   . Coronary artery disease    CABG x5 - 1987  . History of atherosclerotic cardiovascular disease   . History of prostatectomy   . Hyperlipidemia   . Hypertension   . PAD (peripheral artery disease) ([place]) 06/06/2017    Past Surgical History:  Procedure Laterality Date  . CARDIAC CATHETERIZATION     EF of 66%  . CARDIAC VALVE REPLACEMENT  2006  . CORONARY ARTERY BYPASS GRAFT  1987   x5  . I&D EXTREMITY Left 01/10/2015   Procedure: DEBRIDEMENT ANKLE PLACEMENT ANTIBIOTIC BEADS AND WOUND VAC;  Surgeon: [name], MD;  Location: [facility];  Service: Orthopedics;  Laterality: Left;   . IR LUMBAR [place] W/IMG GUIDE  11/10/2018  . TEE WITHOUT CARDIOVERSION N/A 12/18/2018   Procedure: TRANSESOPHAGEAL ECHOCARDIOGRAM (TEE);  Surgeon: Jerline Pain, MD;  Location: [facility] ENDOSCOPY;  Service: Cardiovascular;  Laterality: N/A;    Family History  Problem Relation Age of Onset  . Heart disease Father   . Heart failure Mother   . Heart attack Brother   . Congestive Heart Failure Sister   . Congestive Heart Failure Sister       Social History   Socioeconomic History  . Marital status: Married    Spouse name: Not on file  . Number of children: Not on file  . Years of education: Not on file  . Highest education level: Not on file  Occupational History  . Not on file  Social Needs  . Financial resource strain: Not on file  . Food insecurity    Worry: Not on file    Inability: Not on file  . Transportation needs    Medical: Not on file  Non-medical: Not on file  Tobacco Use  . Smoking status: Former Smoker    Quit date: 11/18/1955    Years since quitting: 63.1  . Smokeless tobacco: Never Used  Substance and Sexual Activity  . Alcohol use: No  . Drug use: No  . Sexual activity: Not on file  Lifestyle  . Physical activity    Days per week: Not on file    Minutes per session: Not on file  . Stress: Not on file  Relationships  . Social Herbalist on phone: Not on file    Gets together: Not on file    Attends religious service: Not on file    Active member of club or organization: Not on file    Attends meetings of clubs or organizations: Not on file    Relationship status: Not on file  Other Topics Concern  . Not on file  Social History Narrative  . Not on file    No Known Allergies   Current Outpatient Medications:  .  acetaminophen (TYLENOL) 500 MG tablet, Take 1,000 mg by mouth every 6 (six) hours as needed (for pain)., Disp: , Rfl:  .  amLODipine (NORVASC) 5 MG tablet, Take 5 mg by mouth daily., Disp: , Rfl:  .  ampicillin IVPB,  Inject 2 g into the vein every 8 (eight) hours. Indication:  Osteomyelitis, discitis Last Day of Therapy: 12/05/18 Labs - Once weekly:  CBC/D and BMP, Labs - Every other week:  ESR and CRP, Disp: 30 Units, Rfl: 0 .  aspirin EC 81 MG tablet, Take 81 mg by mouth daily., Disp: , Rfl:  .  Calcium Carbonate (CALCIUM 600 PO), Take 600 mg by mouth daily., Disp: , Rfl:  .  Cholecalciferol (VITAMIN D-3 PO), Take 1 capsule by mouth daily., Disp: , Rfl:  .  folic acid (FOLVITE) 518 MCG tablet, Take 400 mcg by mouth 2 (two) times a week. , Disp: , Rfl:  .  Glucosamine HCl (GLUCOSAMINE PO), Take 1 tablet by mouth daily., Disp: , Rfl:  .  HYDROcodone-acetaminophen (NORCO/VICODIN) 5-325 MG tablet, Take 1-2 tablets by mouth every 4 (four) hours as needed for moderate pain., Disp: 30 tablet, Rfl: 0 .  lisinopril (ZESTRIL) 40 MG tablet, Take 20 mg by mouth 2 (two) times a day., Disp: , Rfl:  .  metoprolol succinate (TOPROL-XL) 25 MG 24 hr tablet, Take 25 mg by mouth daily., Disp: , Rfl:  .  Multiple Vitamin (THERAGRAN PO), Take 1 tablet by mouth daily., Disp: , Rfl:  .  timolol (TIMOPTIC) 0.5 % ophthalmic solution, Place 1 drop into the right eye 2 (two) times daily. , Disp: , Rfl: 3 .  vitamin C (ASCORBIC ACID) 500 MG tablet, Take 500 mg by mouth daily., Disp: , Rfl:  .  atorvastatin (LIPITOR) 80 MG tablet, Take 40 mg by mouth daily at 6 PM. , Disp: , Rfl:     Review of Systems  Constitutional: Negative for activity change, appetite change, chills, diaphoresis, fatigue, fever and unexpected weight change.  HENT: Negative for congestion, rhinorrhea, sinus pressure, sneezing, sore throat and trouble swallowing.   Eyes: Negative for photophobia and visual disturbance.  Respiratory: Negative for cough, chest tightness, shortness of breath, wheezing and stridor.   Cardiovascular: Negative for chest pain, palpitations and leg swelling.  Gastrointestinal: Negative for abdominal distention, abdominal pain, anal  bleeding, blood in stool, constipation, diarrhea, nausea and vomiting.  Genitourinary: Negative for difficulty urinating, dysuria, flank pain and hematuria.  Musculoskeletal: Positive for back pain. Negative for arthralgias, gait problem, joint swelling and myalgias.  Skin: Negative for color change, pallor, rash and wound.  Neurological: Positive for numbness. Negative for dizziness, tremors, weakness and light-headedness.  Hematological: Negative for adenopathy. Does not bruise/bleed easily.  Psychiatric/Behavioral: Positive for sleep disturbance. Negative for agitation, behavioral problems, confusion, decreased concentration and dysphoric mood.       Objective:   Physical Exam Constitutional:      Appearance: He is well-developed.  HENT:     Head: Normocephalic and atraumatic.  Eyes:     Conjunctiva/sclera: Conjunctivae normal.  Neck:     Musculoskeletal: Normal range of motion and neck supple.  Cardiovascular:     Rate and Rhythm: Normal rate and regular rhythm.     Heart sounds: No murmur. No friction rub. No gallop.   Pulmonary:     Effort: Pulmonary effort is normal. No respiratory distress.     Breath sounds: No wheezing.  Abdominal:     General: There is no distension.     Palpations: Abdomen is soft.  Musculoskeletal: Normal range of motion.        General: No tenderness.  Skin:    General: Skin is warm and dry.     Coloration: Skin is not pale.     Findings: No erythema or rash.  Neurological:     General: No focal deficit present.     Mental Status: He is alert and oriented to person, place, and time.     Motor: Motor function is intact. No tremor, atrophy or abnormal muscle tone.  Psychiatric:        Mood and Affect: Mood normal.        Behavior: Behavior normal.        Thought Content: Thought content normal.        Judgment: Judgment normal.    PICC line is clean dry and intact       Assessment & Plan:   Ampicillin sensitive enterococcal bacteremia  status post 6 weeks of IV antibiotics targeted at possible endocarditis given the presence of a prosthetic valve and the fact that he did not ever have a transesophageal echocardiogram during his initial admission.  He did have severe back pain which I suspected was due to discitis or vertebral osteomyelitis.  However initial MRI imaging did not show this.  He subsequently is been found to have vertebral osteomyelitis and discitis with potential beginnings of an epidural infection.  He was on a protracted course of ampicillin and ceftriaxone combined, I then changed him to ampicillin alone and he is continued on therapy with this.  While the MRI at [facility] continues to show significant pathology it does not appear much different than the imaging done here at [facility].  Furthermore he seems to be getting better clinically.  I do want to continue him on antimicrobials.  I will have him finish his IV ampicillin and the PICC line can then be removed.  He can then start on amoxicillin 500 mg three  times daily.  We will plan on seeing him back in 2 months time.  I spent greater than 25 minutes with the patient including greater than 50% of time in face to face counsel of the patient and his wife regarding danger signs involving disc infection, reviewing his MRIs and in coordination of his care.

## 2019-01-01 NOTE — Telephone Encounter (Signed)
Called Christie ( home health nurse) to relay orders per [name] to stop IV antibiotics and have PICC line removed by home health after last dose.    Attempted to call with No answer will attempt again later.   Lenore Cordia, [place]

## 2019-01-01 NOTE — Telephone Encounter (Signed)
Thank you :)

## 2019-01-01 NOTE — Telephone Encounter (Signed)
Christie returned call. I relayed new orders Per [name] to pull PICC line after last dose of IV antibiotics. Xaviar is to start oral antibiotics the following day.     Adonis Brook confirmed new orders with feedback, and had no further questions or concerns.   Gurveer and his wife were made aware of new orders and that I will be communicating with Adonis Brook as well.    Randy Chen, [place]

## 2019-01-02 DIAGNOSIS — Z0000 Encounter for general adult medical examination without abnormal findings: Secondary | ICD-10-CM | POA: Diagnosis not present

## 2019-01-02 DIAGNOSIS — I10 Essential (primary) hypertension: Secondary | ICD-10-CM | POA: Diagnosis not present

## 2019-01-02 DIAGNOSIS — M4646 Discitis, unspecified, lumbar region: Secondary | ICD-10-CM | POA: Diagnosis not present

## 2019-01-02 DIAGNOSIS — I330 Acute and subacute infective endocarditis: Secondary | ICD-10-CM | POA: Diagnosis not present

## 2019-01-04 ENCOUNTER — Inpatient Hospital Stay (HOSPITAL_BASED_OUTPATIENT_CLINIC_OR_DEPARTMENT_OTHER)
Admission: EM | Admit: 2019-01-04 | Discharge: 2019-01-07 | DRG: 313 | Disposition: A | Discharge status: Home / Self Care | Payer: Medicare Other | Attending: Student | Admitting: Student

## 2019-01-04 ENCOUNTER — Other Ambulatory Visit: Payer: Self-pay

## 2019-01-04 ENCOUNTER — Encounter (HOSPITAL_BASED_OUTPATIENT_CLINIC_OR_DEPARTMENT_OTHER): Payer: Self-pay | Admitting: Emergency Medicine

## 2019-01-04 ENCOUNTER — Emergency Department (HOSPITAL_BASED_OUTPATIENT_CLINIC_OR_DEPARTMENT_OTHER): Payer: Medicare Other

## 2019-01-04 DIAGNOSIS — M4640 Discitis, unspecified, site unspecified: Secondary | ICD-10-CM | POA: Diagnosis not present

## 2019-01-04 DIAGNOSIS — Z79899 Other long term (current) drug therapy: Secondary | ICD-10-CM | POA: Diagnosis not present

## 2019-01-04 DIAGNOSIS — I2510 Atherosclerotic heart disease of native coronary artery without angina pectoris: Secondary | ICD-10-CM | POA: Diagnosis not present

## 2019-01-04 DIAGNOSIS — R29701 NIHSS score 1: Secondary | ICD-10-CM | POA: Diagnosis not present

## 2019-01-04 DIAGNOSIS — E785 Hyperlipidemia, unspecified: Secondary | ICD-10-CM | POA: Diagnosis present

## 2019-01-04 DIAGNOSIS — I25119 Atherosclerotic heart disease of native coronary artery with unspecified angina pectoris: Secondary | ICD-10-CM | POA: Diagnosis not present

## 2019-01-04 DIAGNOSIS — I10 Essential (primary) hypertension: Secondary | ICD-10-CM | POA: Diagnosis not present

## 2019-01-04 DIAGNOSIS — I480 Paroxysmal atrial fibrillation: Secondary | ICD-10-CM | POA: Diagnosis not present

## 2019-01-04 DIAGNOSIS — Z952 Presence of prosthetic heart valve: Secondary | ICD-10-CM | POA: Diagnosis not present

## 2019-01-04 DIAGNOSIS — I491 Atrial premature depolarization: Secondary | ICD-10-CM | POA: Diagnosis not present

## 2019-01-04 DIAGNOSIS — Z87891 Personal history of nicotine dependence: Secondary | ICD-10-CM | POA: Diagnosis not present

## 2019-01-04 DIAGNOSIS — Z953 Presence of xenogenic heart valve: Secondary | ICD-10-CM | POA: Diagnosis not present

## 2019-01-04 DIAGNOSIS — Z8679 Personal history of other diseases of the circulatory system: Secondary | ICD-10-CM | POA: Diagnosis not present

## 2019-01-04 DIAGNOSIS — G459 Transient cerebral ischemic attack, unspecified: Secondary | ICD-10-CM

## 2019-01-04 DIAGNOSIS — Z9079 Acquired absence of other genital organ(s): Secondary | ICD-10-CM

## 2019-01-04 DIAGNOSIS — Z8673 Personal history of transient ischemic attack (TIA), and cerebral infarction without residual deficits: Secondary | ICD-10-CM | POA: Diagnosis not present

## 2019-01-04 DIAGNOSIS — I739 Peripheral vascular disease, unspecified: Secondary | ICD-10-CM | POA: Diagnosis present

## 2019-01-04 DIAGNOSIS — B952 Enterococcus as the cause of diseases classified elsewhere: Secondary | ICD-10-CM | POA: Diagnosis present

## 2019-01-04 DIAGNOSIS — Z66 Do not resuscitate: Secondary | ICD-10-CM | POA: Diagnosis present

## 2019-01-04 DIAGNOSIS — R7881 Bacteremia: Secondary | ICD-10-CM | POA: Diagnosis not present

## 2019-01-04 DIAGNOSIS — Z8249 Family history of ischemic heart disease and other diseases of the circulatory system: Secondary | ICD-10-CM | POA: Diagnosis not present

## 2019-01-04 DIAGNOSIS — Z20828 Contact with and (suspected) exposure to other viral communicable diseases: Secondary | ICD-10-CM | POA: Diagnosis not present

## 2019-01-04 DIAGNOSIS — R4701 Aphasia: Secondary | ICD-10-CM | POA: Diagnosis not present

## 2019-01-04 DIAGNOSIS — Z951 Presence of aortocoronary bypass graft: Secondary | ICD-10-CM | POA: Diagnosis not present

## 2019-01-04 DIAGNOSIS — I639 Cerebral infarction, unspecified: Secondary | ICD-10-CM | POA: Diagnosis not present

## 2019-01-04 DIAGNOSIS — R079 Chest pain, unspecified: Secondary | ICD-10-CM | POA: Diagnosis present

## 2019-01-04 DIAGNOSIS — I330 Acute and subacute infective endocarditis: Secondary | ICD-10-CM | POA: Diagnosis not present

## 2019-01-04 DIAGNOSIS — R0789 Other chest pain: Secondary | ICD-10-CM | POA: Diagnosis not present

## 2019-01-04 DIAGNOSIS — Z7982 Long term (current) use of aspirin: Secondary | ICD-10-CM

## 2019-01-04 DIAGNOSIS — I700 Atherosclerosis of aorta: Secondary | ICD-10-CM | POA: Diagnosis not present

## 2019-01-04 HISTORY — DX: Disorder of arteries and arterioles, unspecified: I77.9

## 2019-01-04 HISTORY — DX: Occlusion and stenosis of left vertebral artery: I65.02

## 2019-01-04 HISTORY — DX: Transient cerebral ischemic attack, unspecified: G45.9

## 2019-01-04 HISTORY — DX: Endocarditis, valve unspecified: I38

## 2019-01-04 HISTORY — DX: Discitis, unspecified, lumbar region: M46.46

## 2019-01-04 HISTORY — DX: Spinal stenosis, lumbar region without neurogenic claudication: M48.061

## 2019-01-04 LAB — TROPONIN I (HIGH SENSITIVITY)
Troponin I (High Sensitivity): 29 ng/L — ABNORMAL HIGH (ref <18)
Troponin I (High Sensitivity): 23 ng/L — ABNORMAL HIGH (ref <18)

## 2019-01-04 LAB — CBC
HCT: 34.4 % — ABNORMAL LOW (ref 39.0–52.0)
Hemoglobin: 10.9 g/dL — ABNORMAL LOW (ref 13.0–17.0)
MCH: 28.5 pg (ref 26.0–34.0)
MCHC: 31.7 g/dL (ref 30.0–36.0)
MCV: 89.8 fL (ref 80.0–100.0)
Platelets: 238 10*3/uL (ref 150–400)
RBC: 3.83 MIL/uL — ABNORMAL LOW (ref 4.22–5.81)
RDW: 14.8 % (ref 11.5–15.5)
WBC: 9.5 10*3/uL (ref 4.0–10.5)
nRBC: 0 % (ref 0.0–0.2)

## 2019-01-04 LAB — BASIC METABOLIC PANEL
Anion gap: 13 (ref 5–15)
BUN: 23 mg/dL (ref 8–23)
CO2: 23 mmol/L (ref 22–32)
Calcium: 9.5 mg/dL (ref 8.9–10.3)
Chloride: 102 mmol/L (ref 98–111)
Creatinine, Ser: 1.3 mg/dL — ABNORMAL HIGH (ref 0.61–1.24)
GFR calc Af Amer: 56 mL/min — ABNORMAL LOW (ref >60)
GFR calc non Af Amer: 49 mL/min — ABNORMAL LOW (ref >60)
Glucose, Bld: 138 mg/dL — ABNORMAL HIGH (ref 70–99)
Potassium: 4.3 mmol/L (ref 3.5–5.1)
Sodium: 138 mmol/L (ref 135–145)

## 2019-01-04 LAB — LIPASE, BLOOD: Lipase: 28 U/L (ref 11–51)

## 2019-01-04 LAB — SARS CORONAVIRUS 2 BY RT PCR (HOSPITAL ORDER, PERFORMED IN ~~LOC~~ HOSPITAL LAB): SARS Coronavirus 2: NEGATIVE

## 2019-01-04 MED ORDER — HYDROCODONE-ACETAMINOPHEN 5-325 MG PO TABS: 1.0000 | ORAL_TABLET | ORAL | Status: DC | PRN

## 2019-01-04 MED ORDER — ACETAMINOPHEN 325 MG PO TABS: 650.0000 mg | ORAL_TABLET | Freq: Four times a day (QID) | ORAL | Status: DC | PRN

## 2019-01-04 MED ORDER — ONDANSETRON HCL 4 MG PO TABS: 4.0000 mg | ORAL_TABLET | Freq: Four times a day (QID) | ORAL | Status: DC | PRN

## 2019-01-04 MED ORDER — METOPROLOL SUCCINATE ER 25 MG PO TB24
25.0000 mg | ORAL_TABLET | Freq: Every day | ORAL | Status: DC
  Administered 2019-01-04 – 2019-01-05 (×2): 25 mg via ORAL
  Filled 2019-01-04 (×2): qty 1

## 2019-01-04 MED ORDER — FOLIC ACID 400 MCG PO TABS: 400.0000 ug | ORAL_TABLET | ORAL | Status: DC

## 2019-01-04 MED ORDER — AMLODIPINE BESYLATE 5 MG PO TABS
5.0000 mg | ORAL_TABLET | Freq: Every day | ORAL | Status: DC
  Administered 2019-01-04 – 2019-01-07 (×4): 5 mg via ORAL
  Filled 2019-01-04 (×4): qty 1

## 2019-01-04 MED ORDER — LIDOCAINE VISCOUS HCL 2 % MT SOLN
15.0000 mL | Freq: Once | OROMUCOSAL | Status: AC
  Administered 2019-01-04: 15 mL via ORAL
  Filled 2019-01-04: qty 15

## 2019-01-04 MED ORDER — MORPHINE SULFATE (PF) 2 MG/ML IV SOLN
2.0000 mg | INTRAVENOUS | Status: DC | PRN
  Administered 2019-01-04: 18:00:00 2 mg via INTRAVENOUS
  Filled 2019-01-04: qty 1

## 2019-01-04 MED ORDER — FAMOTIDINE IN NACL 20-0.9 MG/50ML-% IV SOLN
20.0000 mg | Freq: Once | INTRAVENOUS | Status: AC
  Administered 2019-01-04: 11:00:00 20 mg via INTRAVENOUS
  Filled 2019-01-04: qty 50

## 2019-01-04 MED ORDER — ASPIRIN 81 MG PO CHEW
324.0000 mg | CHEWABLE_TABLET | Freq: Once | ORAL | Status: AC
  Administered 2019-01-04: 14:00:00 324 mg via ORAL
  Filled 2019-01-04: qty 4

## 2019-01-04 MED ORDER — ZOLPIDEM TARTRATE 5 MG PO TABS: 5.0000 mg | ORAL_TABLET | Freq: Every evening | ORAL | Status: DC | PRN

## 2019-01-04 MED ORDER — ASPIRIN EC 81 MG PO TBEC
81.0000 mg | DELAYED_RELEASE_TABLET | Freq: Every day | ORAL | Status: DC
  Administered 2019-01-04 – 2019-01-06 (×3): 81 mg via ORAL
  Filled 2019-01-04 (×3): qty 1

## 2019-01-04 MED ORDER — ENOXAPARIN SODIUM 40 MG/0.4ML ~~LOC~~ SOLN
40.0000 mg | SUBCUTANEOUS | Status: DC
  Administered 2019-01-04 – 2019-01-05 (×2): 40 mg via SUBCUTANEOUS
  Filled 2019-01-04 (×2): qty 0.4

## 2019-01-04 MED ORDER — NITROGLYCERIN 2 % TD OINT
0.5000 [in_us] | TOPICAL_OINTMENT | Freq: Three times a day (TID) | TRANSDERMAL | Status: DC
  Administered 2019-01-04 – 2019-01-07 (×8): 0.5 [in_us] via TOPICAL
  Filled 2019-01-04: qty 30

## 2019-01-04 MED ORDER — LISINOPRIL 20 MG PO TABS
20.0000 mg | ORAL_TABLET | Freq: Two times a day (BID) | ORAL | Status: DC
  Administered 2019-01-04 – 2019-01-07 (×6): 20 mg via ORAL
  Filled 2019-01-04 (×6): qty 1

## 2019-01-04 MED ORDER — AMOXICILLIN 500 MG PO CAPS
500.0000 mg | ORAL_CAPSULE | Freq: Three times a day (TID) | ORAL | Status: DC
  Administered 2019-01-04 – 2019-01-07 (×8): 500 mg via ORAL
  Filled 2019-01-04 (×11): qty 1

## 2019-01-04 MED ORDER — ACETAMINOPHEN 650 MG RE SUPP: 650.0000 mg | Freq: Four times a day (QID) | RECTAL | Status: DC | PRN

## 2019-01-04 MED ORDER — ALUM & MAG HYDROXIDE-SIMETH 200-200-20 MG/5ML PO SUSP
30.0000 mL | Freq: Once | ORAL | Status: AC
  Administered 2019-01-04: 30 mL via ORAL
  Filled 2019-01-04: qty 30

## 2019-01-04 MED ORDER — ONDANSETRON HCL 4 MG/2ML IJ SOLN: 4.0000 mg | Freq: Four times a day (QID) | INTRAMUSCULAR | Status: DC | PRN

## 2019-01-04 MED ORDER — TIMOLOL MALEATE 0.5 % OP SOLN
1.0000 [drp] | Freq: Two times a day (BID) | OPHTHALMIC | Status: DC
  Administered 2019-01-04 – 2019-01-07 (×6): 1 [drp] via OPHTHALMIC
  Filled 2019-01-04: qty 5

## 2019-01-04 MED ORDER — ACETAMINOPHEN 500 MG PO TABS: 1000.0000 mg | ORAL_TABLET | Freq: Four times a day (QID) | ORAL | Status: DC | PRN

## 2019-01-04 NOTE — ED Triage Notes (Signed)
C/o "heaviness in his chest" that kept him up all night. Endorses SOB

## 2019-01-04 NOTE — ED Notes (Signed)
ED Provider at bedside. 

## 2019-01-04 NOTE — Progress Notes (Signed)
85 year old male with past medical history significant for coronary artery disease status post CABG in the past and no stress test lately presenting with 2 days history of chest pain/epigastric pain.  EKG shows ?ST depression in inferior leads his .HS troponin was 29-23.  Admitted for observation for rule out MI/telemetry /. ACS .

## 2019-01-04 NOTE — Progress Notes (Signed)
Pt arrived to unit, on going chest pain 4/10. Paged admitting Md

## 2019-01-04 NOTE — ED Notes (Signed)
Report given to Willow Ora, Therapist, sports at [facility].

## 2019-01-04 NOTE — ED Provider Notes (Signed)
[facility] Emergency Department Provider Note MRN:  [id number]  Arrival date & time: 01/04/19     Chief Complaint   Chest Pain   History of Present Illness   Randy Chen is a 85 y.o. year-old male with a history of CAD presenting to the ED with chief complaint of chest pain.  Pain is located in the epigastrium and lower chest, sudden onset yesterday evening, constant since that time, described as a tightness or pressure, associated with mild shortness of breath.  Denies dizziness or diaphoresis, no nausea or vomiting.  No recent leg pain or swelling.  Symptoms constant, no exacerbating or alleviating factors.  Review of Systems  A complete 10 system review of systems was obtained and all systems are negative except as noted in the HPI and PMH.   Patient's Health History    Past Medical History:  Diagnosis Date  . Aortic stenosis   . Cancer ([place])   . Coronary artery disease    CABG x5 - 1987  . History of atherosclerotic cardiovascular disease   . History of prostatectomy   . Hyperlipidemia   . Hypertension   . PAD (peripheral artery disease) ([place]) 06/06/2017    Past Surgical History:  Procedure Laterality Date  . CARDIAC CATHETERIZATION     EF of 66%  . CARDIAC VALVE REPLACEMENT  2006  . CORONARY ARTERY BYPASS GRAFT  1987   x5  . I&D EXTREMITY Left 01/10/2015   Procedure: DEBRIDEMENT ANKLE PLACEMENT ANTIBIOTIC BEADS AND WOUND VAC;  Surgeon: [name], MD;  Location: [place];  Service: Orthopedics;  Laterality: Left;  . IR LUMBAR [place] W/IMG GUIDE  11/10/2018  . TEE WITHOUT CARDIOVERSION N/A 12/18/2018   Procedure: TRANSESOPHAGEAL ECHOCARDIOGRAM (TEE);  Surgeon: Jerline Pain, MD;  Location: [facility] ENDOSCOPY;  Service: Cardiovascular;  Laterality: N/A;    Family History  Problem Relation Age of Onset  . Heart disease Father   . Heart failure Mother   . Heart attack Brother   . Congestive Heart Failure Sister   . Congestive  Heart Failure Sister     Social History   Socioeconomic History  . Marital status: Married    Spouse name: Not on file  . Number of children: Not on file  . Years of education: Not on file  . Highest education level: Not on file  Occupational History  . Not on file  Social Needs  . Financial resource strain: Not on file  . Food insecurity    Worry: Not on file    Inability: Not on file  . Transportation needs    Medical: Not on file    Non-medical: Not on file  Tobacco Use  . Smoking status: Former Smoker    Quit date: 11/18/1955    Years since quitting: 63.1  . Smokeless tobacco: Never Used  Substance and Sexual Activity  . Alcohol use: No  . Drug use: No  . Sexual activity: Not on file  Lifestyle  . Physical activity    Days per week: Not on file    Minutes per session: Not on file  . Stress: Not on file  Relationships  . Social Herbalist on phone: Not on file    Gets together: Not on file    Attends religious service: Not on file    Active member of club or organization: Not on file    Attends meetings of clubs or organizations: Not on file  Relationship status: Not on file  . Intimate partner violence    Fear of current or ex partner: Not on file    Emotionally abused: Not on file    Physically abused: Not on file    Forced sexual activity: Not on file  Other Topics Concern  . Not on file  Social History Narrative  . Not on file     Physical Exam  Vital Signs and Nursing Notes reviewed Vitals:   01/04/19 1443 01/04/19 1446  BP: (!) 178/102   Pulse: 99   Resp: 18 18  Temp:    SpO2: 100%     CONSTITUTIONAL: Well-appearing, NAD NEURO:  Alert and oriented x 3, no focal deficits EYES:  eyes equal and reactive ENT/NECK:  no LAD, no JVD CARDIO: Regular rate, well-perfused, normal S1 and S2 PULM:  CTAB no wheezing or rhonchi GI/GU:  normal bowel sounds, non-distended, non-tender MSK/SPINE:  No gross deformities, no edema SKIN:  no rash,  atraumatic PSYCH:  Appropriate speech and behavior  Diagnostic and Interventional Summary    EKG Interpretation  Date/Time:  Saturday January 04 2019 10:18:21 EDT Ventricular Rate:  88 PR Interval:    QRS Duration: 97 QT Interval:  350 QTC Calculation: 424 R Axis:   70 Text Interpretation:  Sinus rhythm Minimal ST depression, inferior leads Confirmed by Gerlene Fee [phone]) on 01/04/2019 10:31:38 AM Also confirmed by Gerlene Fee [phone]), editor Philomena Doheny [phone])  on 01/04/2019 2:19:01 PM      Labs Reviewed  BASIC METABOLIC PANEL - Abnormal; Notable for the following components:      Result Value   Glucose, Bld 138 (*)    Creatinine, Ser 1.30 (*)    GFR calc non Af Amer 49 (*)    GFR calc Af Amer 56 (*)    All other components within normal limits  CBC - Abnormal; Notable for the following components:   RBC 3.83 (*)    Hemoglobin 10.9 (*)    HCT 34.4 (*)    All other components within normal limits  TROPONIN I (HIGH SENSITIVITY) - Abnormal; Notable for the following components:   Troponin I (High Sensitivity) 29 (*)    All other components within normal limits  TROPONIN I (HIGH SENSITIVITY) - Abnormal; Notable for the following components:   Troponin I (High Sensitivity) 23 (*)    All other components within normal limits  SARS CORONAVIRUS 2 (HOSPITAL ORDER, PERFORMED [place] [facility])  LIPASE, BLOOD    DG Chest 2 View  Final Result      Medications  alum & mag hydroxide-simeth (MAALOX/MYLANTA) 200-200-20 MG/5ML suspension 30 mL (30 mLs Oral Given 01/04/19 1109)    And  lidocaine (XYLOCAINE) 2 % viscous mouth solution 15 mL (15 mLs Oral Given 01/04/19 1109)  famotidine (PEPCID) IVPB 20 mg premix (0 mg Intravenous Stopped 01/04/19 1159)  aspirin chewable tablet 324 mg (324 mg Oral Given 01/04/19 1341)     Procedures Critical Care  ED Course and Medical Decision Making  I have reviewed the triage vital signs and the nursing notes.  Pertinent labs &  imaging results that were available during my care of the patient were reviewed by me and considered in my medical decision making (see below for details).  Considering ACS in this 85 year old male with history of CAD, CABG.  Patient's pain location is atypical, more in the epigastrium, will attempt treatment for GERD related pain, waiting troponin.  EKG is without ischemic features.  No  tachycardia, no evidence of DVT, little to no concern for PE.  Work-up reveals high-sensitivity troponins between 20 and 30.  Patient's pain is somewhat improved after GI cocktail and Pepcid but is not resolved.  Cardiac etiology thought to less likely than GI etiology but still possible, and per hour high-sensitivity troponin protocol, based on patient's heart score, he will be admitted to the hospitalist service for observation admission.  Barth Kirks. Sedonia Small, MD [place] [email or web address]  Final Clinical Impressions(s) / ED Diagnoses     ICD-10-CM   1. Chest pain, unspecified type  R07.9     ED Discharge Orders    None         Maudie Flakes, MD 01/04/19 1527

## 2019-01-04 NOTE — H&P (Signed)
[facility]                                                                                    Patient Demographics  Randy Chen, is a 85 y.o. male  CSN: [id number]  MRN: [id number]  DOB - [birth year]  Admit Date - 01/04/2019  Outpatient Primary MD for the patient is [name], MD   With History of -  Past Medical History:  Diagnosis Date  . Aortic stenosis   . Cancer ([place])   . Coronary artery disease    CABG x5 - 1987  . History of atherosclerotic cardiovascular disease   . History of prostatectomy   . Hyperlipidemia   . Hypertension   . PAD (peripheral artery disease) ([place]) 06/06/2017      Past Surgical History:  Procedure Laterality Date  . CARDIAC CATHETERIZATION     EF of 66%  . CARDIAC VALVE REPLACEMENT  2006  . CORONARY ARTERY BYPASS GRAFT  1987   x5  . I&D EXTREMITY Left 01/10/2015   Procedure: DEBRIDEMENT ANKLE PLACEMENT ANTIBIOTIC BEADS AND WOUND VAC;  Surgeon: [name], MD;  Location: [place];  Service: Orthopedics;  Laterality: Left;  . IR LUMBAR [place] W/IMG GUIDE  11/10/2018  . TEE WITHOUT CARDIOVERSION N/A 12/18/2018   Procedure: TRANSESOPHAGEAL ECHOCARDIOGRAM (TEE);  Surgeon: Jerline Pain, MD;  Location: [facility] ENDOSCOPY;  Service: Cardiovascular;  Laterality: N/A;    in for   Chief Complaint  Patient presents with  . Chest Pain     HPI  Randy Chen  is a 85 y.o. male, with past medical history significant for CAD status post CABG in the past/normal stress test lately presenting with 2 days history of chest pain/epigastric pain associated with mild shortness of breath, continuous, nonradiating with no relieving and exacerbating factors.  The pain started yesterday at night and continued and now it is still at 4.  The pain improved with GI cocktail but still present.  No history of cold sweats, nausea, dizziness or loss of consciousness. EKG work-up showed nonspecific T waves with troponins 29-23. Patient is on p.o.  amoxicillin for discitis/enterococcal bacteremia followed by [name] .     Review of Systems    In addition to the HPI above,  No Fever-chills, mild Headache, No changes with Vision or hearing, No problems swallowing food or Liquids, No No Nausea or Vommitting, Bowel movements are regular, No Blood in stool or Urine, No dysuria, No new skin rashes or bruises, No new joints pains-aches,  No new weakness, tingling, numbness in any extremity, No recent weight gain or loss, No polyuria, polydypsia or polyphagia, No significant Mental Stressors.  A full 10 point Review of Systems was done, except as stated above, all other Review of Systems were negative.   Social History Social History   Tobacco Use  . Smoking status: Former Smoker    Quit date: 11/18/1955    Years since quitting: 63.1  . Smokeless tobacco: Never Used  Substance Use Topics  . Alcohol use: No     Family History Family History  Problem Relation Age of Onset  . Heart disease Father   .  Heart failure Mother   . Heart attack Brother   . Congestive Heart Failure Sister   . Congestive Heart Failure Sister      Prior to Admission medications   Medication Sig Start Date End Date Taking? Authorizing Provider  amLODipine (NORVASC) 5 MG tablet Take 5 mg by mouth daily.   Yes [provider]  amoxicillin (AMOXIL) 500 MG capsule Take 1 capsule (500 mg total) by mouth 3 (three) times daily. 01/01/19  Yes Tommy Medal, [name], MD  ampicillin IVPB Inject 2 g into the vein every 8 (eight) hours. Indication:  Osteomyelitis, discitis Last Day of Therapy: 12/05/18 Labs - Once weekly:  CBC/D and BMP, Labs - Every other week:  ESR and CRP 11/15/18  Yes Mikhail, Cleona, DO  aspirin EC 81 MG tablet Take 81 mg by mouth daily.   Yes [provider]  Calcium Carbonate (CALCIUM 600 PO) Take 600 mg by mouth daily.   Yes [provider]  Cholecalciferol (VITAMIN D-3 PO) Take 1 capsule by mouth daily.    Yes [provider]  folic acid (FOLVITE) 856 MCG tablet Take 400 mcg by mouth 2 (two) times a week.    Yes [provider]  Glucosamine HCl (GLUCOSAMINE PO) Take 1 tablet by mouth daily.   Yes [provider]  HYDROcodone-acetaminophen (NORCO/VICODIN) 5-325 MG tablet Take 1-2 tablets by mouth every 4 (four) hours as needed for moderate pain. 12/10/18  Yes Lelon Perla, MD  lisinopril (ZESTRIL) 40 MG tablet Take 20 mg by mouth 2 (two) times a day.   Yes [provider]  metoprolol succinate (TOPROL-XL) 25 MG 24 hr tablet Take 25 mg by mouth daily.   Yes [provider]  Multiple Vitamin (THERAGRAN PO) Take 1 tablet by mouth daily.   Yes [provider]  timolol (TIMOPTIC) 0.5 % ophthalmic solution Place 1 drop into the right eye 2 (two) times daily.  11/07/17  Yes [provider]  vitamin C (ASCORBIC ACID) 500 MG tablet Take 500 mg by mouth daily.   Yes [provider]  acetaminophen (TYLENOL) 500 MG tablet Take 1,000 mg by mouth every 6 (six) hours as needed (for pain).    [provider]    No Known Allergies  Physical Exam  Vitals  Blood pressure (!) 162/88, pulse 93, temperature 98 F (36.7 C), temperature source Oral, resp. rate (!) 21, height _0  (1.702 m), weight 72.6 kg, SpO2 100 %.  General appearance: Anxious elderly male, extremely pleasant looks tired HEENT no jaundice or pallor, no facial deviation or oral thrush Neck supple, no neck vein distention noted Chest clear and resonant Heart normal S1-S2, irregular at times Abdomen soft, nontender, bowel sounds are present Extremities no clubbing cyanosis or edema Neuro gross nonfocal, patient moving all extremities alert awake oriented x3 Skin no rashes or ulcers  Data Review  CBC Recent Labs  Lab 01/04/19 1017  WBC 9.5  HGB 10.9*  HCT 34.4*  PLT 238  MCV 89.8  MCH 28.5  MCHC 31.7  RDW 14.8    ------------------------------------------------------------------------------------------------------------------  Chemistries  Recent Labs  Lab 01/04/19 1017  NA 138  K 4.3  CL 102  CO2 23  GLUCOSE 138*  BUN 23  CREATININE 1.30*  CALCIUM 9.5   ------------------------------------------------------------------------------------------------------------------ estimated creatinine clearance is 36.7 mL/min (A) (by C-G formula based on SCr of 1.3 mg/dL (H)). ------------------------------------------------------------------------------------------------------------------ No results for input(s): TSH, T4TOTAL, T3FREE, THYROIDAB in the last 72 hours.  Invalid  input(s): FREET3   Coagulation profile No results for input(s): INR, PROTIME in the last 168 hours. ------------------------------------------------------------------------------------------------------------------- No results for input(s): DDIMER in the last 72 hours. -------------------------------------------------------------------------------------------------------------------  Cardiac Enzymes No results for input(s): CKMB, TROPONINI, MYOGLOBIN in the last 168 hours.  Invalid input(s): CK ------------------------------------------------------------------------------------------------------------------ Invalid input(s): POCBNP   ---------------------------------------------------------------------------------------------------------------  Urinalysis    Component Value Date/Time   COLORURINE YELLOW 11/09/2018 [place] 11/09/2018 1442   LABSPEC 1.015 11/09/2018 1442   PHURINE 7.0 11/09/2018 1442   GLUCOSEU NEGATIVE 11/09/2018 1442   HGBUR NEGATIVE 11/09/2018 1442   BILIRUBINUR NEGATIVE 11/09/2018 1442   KETONESUR NEGATIVE 11/09/2018 1442   PROTEINUR NEGATIVE 11/09/2018 1442   UROBILINOGEN 0.2 01/09/2015 1853   NITRITE NEGATIVE 11/09/2018 1442   LEUKOCYTESUR NEGATIVE 11/09/2018 1442     ----------------------------------------------------------------------------------------------------------------  Imaging results:   Dg Chest 2 View  Result Date: 01/04/2019 CLINICAL DATA:  Chest pain and epigastric abdominal pain that began acutely last night. EXAM: CHEST - 2 VIEW COMPARISON:  11/14/2018 and earlier. FINDINGS: Sternotomy for CABG and aortic valve replacement. Cardiac silhouette normal in size, unchanged. Thoracic aorta tortuous and atherosclerotic, unchanged. Hilar and mediastinal contours otherwise unremarkable. Lungs clear. Bronchovascular markings normal. Pulmonary vascularity normal. No visible pleural effusions. No pneumothorax. Degenerative changes and DISH involving the thoracic spine. Remote healed RIGHT rib fractures. IMPRESSION: No acute cardiopulmonary disease. Electronically Signed   By: Evangeline Dakin M.D.   On: 01/04/2019 11:03    My personal review of EKG: Sinus arrhythmia at 98 bpm with nonspecific ST changes  Assessment & Plan  Chest pain/epigastric pain Etiology unclear at this time but EKG does not show any significant acute changes and troponins are 29-23 Patient continues to have the chest pain/epigastric pain although treated by GI cocktail. We will place nitro paste and continue with aspirin. May need a stress test in a.m. Patient is status post transesophageal echocardiogram recently with ejection fraction 60 to 65%  Discitis Patient on p.o. amoxicillin  Hypertension continue with Norvasc and metoprolol  DVT Prophylaxis Lovenox  AM Labs Ordered, also please review Full Orders  Family Communication: called and spoke with his wife Benjamine Mola.  Code Status DNR  Disposition Plan: Home  Time spent in minutes : 46 minutes  Condition GUARDED   _0 @

## 2019-01-05 ENCOUNTER — Other Ambulatory Visit: Payer: Self-pay

## 2019-01-05 ENCOUNTER — Observation Stay (HOSPITAL_COMMUNITY): Payer: Medicare Other

## 2019-01-05 ENCOUNTER — Encounter (HOSPITAL_COMMUNITY): Payer: Self-pay | Admitting: Nurse Practitioner

## 2019-01-05 ENCOUNTER — Inpatient Hospital Stay (HOSPITAL_COMMUNITY): Payer: Medicare Other

## 2019-01-05 DIAGNOSIS — Z953 Presence of xenogenic heart valve: Secondary | ICD-10-CM | POA: Diagnosis not present

## 2019-01-05 DIAGNOSIS — Z952 Presence of prosthetic heart valve: Secondary | ICD-10-CM | POA: Diagnosis not present

## 2019-01-05 DIAGNOSIS — I480 Paroxysmal atrial fibrillation: Secondary | ICD-10-CM

## 2019-01-05 DIAGNOSIS — R079 Chest pain, unspecified: Secondary | ICD-10-CM

## 2019-01-05 DIAGNOSIS — Z9079 Acquired absence of other genital organ(s): Secondary | ICD-10-CM | POA: Diagnosis not present

## 2019-01-05 DIAGNOSIS — M4640 Discitis, unspecified, site unspecified: Secondary | ICD-10-CM | POA: Diagnosis present

## 2019-01-05 DIAGNOSIS — Z79899 Other long term (current) drug therapy: Secondary | ICD-10-CM | POA: Diagnosis not present

## 2019-01-05 DIAGNOSIS — Z87891 Personal history of nicotine dependence: Secondary | ICD-10-CM | POA: Diagnosis not present

## 2019-01-05 DIAGNOSIS — I10 Essential (primary) hypertension: Secondary | ICD-10-CM | POA: Diagnosis present

## 2019-01-05 DIAGNOSIS — I25119 Atherosclerotic heart disease of native coronary artery with unspecified angina pectoris: Secondary | ICD-10-CM | POA: Diagnosis not present

## 2019-01-05 DIAGNOSIS — R7881 Bacteremia: Secondary | ICD-10-CM | POA: Diagnosis present

## 2019-01-05 DIAGNOSIS — R4701 Aphasia: Secondary | ICD-10-CM | POA: Diagnosis not present

## 2019-01-05 DIAGNOSIS — I739 Peripheral vascular disease, unspecified: Secondary | ICD-10-CM | POA: Diagnosis present

## 2019-01-05 DIAGNOSIS — E785 Hyperlipidemia, unspecified: Secondary | ICD-10-CM | POA: Diagnosis present

## 2019-01-05 DIAGNOSIS — I639 Cerebral infarction, unspecified: Secondary | ICD-10-CM | POA: Diagnosis not present

## 2019-01-05 DIAGNOSIS — Z7982 Long term (current) use of aspirin: Secondary | ICD-10-CM | POA: Diagnosis not present

## 2019-01-05 DIAGNOSIS — I700 Atherosclerosis of aorta: Secondary | ICD-10-CM | POA: Diagnosis present

## 2019-01-05 DIAGNOSIS — Z66 Do not resuscitate: Secondary | ICD-10-CM | POA: Diagnosis present

## 2019-01-05 DIAGNOSIS — R29701 NIHSS score 1: Secondary | ICD-10-CM | POA: Diagnosis not present

## 2019-01-05 DIAGNOSIS — Z951 Presence of aortocoronary bypass graft: Secondary | ICD-10-CM | POA: Diagnosis not present

## 2019-01-05 DIAGNOSIS — Z20828 Contact with and (suspected) exposure to other viral communicable diseases: Secondary | ICD-10-CM | POA: Diagnosis present

## 2019-01-05 DIAGNOSIS — B952 Enterococcus as the cause of diseases classified elsewhere: Secondary | ICD-10-CM | POA: Diagnosis present

## 2019-01-05 DIAGNOSIS — Z8673 Personal history of transient ischemic attack (TIA), and cerebral infarction without residual deficits: Secondary | ICD-10-CM | POA: Diagnosis not present

## 2019-01-05 DIAGNOSIS — R0789 Other chest pain: Secondary | ICD-10-CM | POA: Diagnosis present

## 2019-01-05 DIAGNOSIS — I2510 Atherosclerotic heart disease of native coronary artery without angina pectoris: Secondary | ICD-10-CM | POA: Diagnosis present

## 2019-01-05 DIAGNOSIS — Z8249 Family history of ischemic heart disease and other diseases of the circulatory system: Secondary | ICD-10-CM | POA: Diagnosis not present

## 2019-01-05 DIAGNOSIS — Z8679 Personal history of other diseases of the circulatory system: Secondary | ICD-10-CM | POA: Diagnosis not present

## 2019-01-05 DIAGNOSIS — I330 Acute and subacute infective endocarditis: Secondary | ICD-10-CM | POA: Diagnosis present

## 2019-01-05 LAB — URINALYSIS, ROUTINE W REFLEX MICROSCOPIC
Bacteria, UA: NONE SEEN
Bilirubin Urine: NEGATIVE
Glucose, UA: NEGATIVE mg/dL
Ketones, ur: NEGATIVE mg/dL
Leukocytes,Ua: NEGATIVE
Nitrite: NEGATIVE
Protein, ur: 30 mg/dL — AB
Specific Gravity, Urine: 1.044 — ABNORMAL HIGH (ref 1.005–1.030)
pH: 6 (ref 5.0–8.0)

## 2019-01-05 LAB — TROPONIN I (HIGH SENSITIVITY): Troponin I (High Sensitivity): 45 ng/L — ABNORMAL HIGH (ref <18)

## 2019-01-05 MED ORDER — APIXABAN 5 MG PO TABS
5.0000 mg | ORAL_TABLET | Freq: Two times a day (BID) | ORAL | Status: DC
  Administered 2019-01-05 – 2019-01-07 (×4): 5 mg via ORAL
  Filled 2019-01-05 (×4): qty 1

## 2019-01-05 MED ORDER — IOHEXOL 350 MG/ML SOLN
50.0000 mL | Freq: Once | INTRAVENOUS | Status: AC | PRN
  Administered 2019-01-05: 50 mL via INTRAVENOUS

## 2019-01-05 MED ORDER — IOHEXOL 350 MG/ML SOLN
50.0000 mL | Freq: Once | INTRAVENOUS | Status: AC | PRN
  Administered 2019-01-05: 12:00:00 50 mL via INTRAVENOUS

## 2019-01-05 MED ORDER — GADOBUTROL 1 MMOL/ML IV SOLN
7.0000 mL | Freq: Once | INTRAVENOUS | Status: AC | PRN
  Administered 2019-01-05: 18:00:00 7 mL via INTRAVENOUS

## 2019-01-05 MED ORDER — METOPROLOL TARTRATE 25 MG PO TABS
25.0000 mg | ORAL_TABLET | Freq: Three times a day (TID) | ORAL | Status: DC
  Administered 2019-01-05 – 2019-01-07 (×5): 25 mg via ORAL
  Filled 2019-01-05 (×5): qty 1

## 2019-01-05 NOTE — Progress Notes (Signed)
PROGRESS NOTE    DELMONT PROSCH  RWE:[id number] DOB: [birth year] DOA: 01/04/2019 PCP: [name], MD  Outpatient Specialists:   Brief Narrative:  Patient is an 85 year old Caucasian male with past medical history significant for coronary artery disease status post CABG, peripheral artery disease, aortic stenosis status post bioprosthetic valve placement, hypertension, hyperlipidemia and history of prostatectomy.  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  Patient is being followed by the infectious disease team.  H&P documents 2-day history of chest pain.  High sensitive troponin has gone from 29-23 and the last troponin done this morning was 45.  Initial troponin revealed sinus tachycardia with PACs and non-specific T wave changes.  Initially he H&P had indicated a possible stress test.  Will consult cardiology.  01/05/2019: Patient seen.  Patient is a very poor historian.  Patient is unable to give any cohesive history.  Patient denies any chest pain currently.  Assessment & Plan:   Active Problems:   Chest pain  Chest pain/epigastric pain: Kindly see above documentation. History of coronary artery disease, PAD and CABG noted. Troponin is noted. EKG is done today. Will consult cardiology team as planned. Currently, patient denies chest pain. Further management will depend on hospital course.  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   Currently on p.o. amoxicillin.   We will continue amoxicillin.    Possible previously undiagnosed cognitive deficit versus acute encephalopathy: Continue to assess and monitor. No leukocytosis. Chest x-ray does not reveal any active cardiopulmonary disease No documented fever. We will check urinalysis. Further management depend on hospital  course.  Hypertension: Continue to optimize.    DVT prophylaxis: Subacute Lovenox Code Status: DNR Family Communication:  Disposition Plan: This will depend on hospital course.  Consultants:   We will consult cardiology as per the H&P.  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   Subjective: No significant history from patient. Denies chest pain.  Objective: Vitals:   01/04/19 1657 01/04/19 2128 01/05/19 0300 01/05/19 0333  BP: (!) 162/88 (!) 137/98  (!) 153/91  Pulse: 93 92  (!) 110  Resp: (!) 21 (!) 21  (!) 21  Temp: 98 F (36.7 C) 99.2 F (37.3 C)  99.6 F (37.6 C)  TempSrc: Oral Oral  Oral  SpO2: 100% 97%  96%  Weight: 72.6 kg  72.8 kg   Height: 5\' 7"  (1.702 m)       Intake/Output Summary (Last 24 hours) at 01/05/2019 1004 Last data filed at 01/05/2019 0524 Gross per 24 hour  Intake 50 ml  Output 580 ml  Net -530 ml   Filed Weights   01/04/19 1013 01/04/19 1657 01/05/19 0300  Weight: 76.6 kg 72.6 kg 72.8 kg    Examination:  General exam: Appears calm and comfortable. Respiratory system: Clear to auscultation.  Cardiovascular system: S1 & S2, tachycardic.  Gastrointestinal system: Abdomen is nondistended, soft and nontender. No organomegaly or masses felt. Normal bowel sounds heard. Central nervous system: Alert and oriented.  Patient moves all extremities. Extremities: Bilateral ankle edema  Data Reviewed: I have personally reviewed following labs and imaging studies  CBC: Recent Labs  Lab 01/04/19 1017  WBC 9.5  HGB 10.9*  HCT 34.4*  MCV 89.8  PLT 619   Basic Metabolic Panel: Recent Labs  Lab 01/04/19 1017  NA 138  K  4.3  CL 102  CO2 23  GLUCOSE 138*  BUN 23  CREATININE 1.30*  CALCIUM 9.5   GFR: Estimated Creatinine Clearance: 36.7 mL/min (A) (by C-G formula based on SCr of 1.3 mg/dL (H)). Liver Function Tests: No results for  input(s): AST, ALT, ALKPHOS, BILITOT, PROT, ALBUMIN in the last 168 hours. Recent Labs  Lab 01/04/19 1017  LIPASE 28   No results for input(s): AMMONIA in the last 168 hours. Coagulation Profile: No results for input(s): INR, PROTIME in the last 168 hours. Cardiac Enzymes: No results for input(s): CKTOTAL, CKMB, CKMBINDEX, TROPONINI in the last 168 hours. BNP (last 3 results) No results for input(s): PROBNP in the last 8760 hours. HbA1C: No results for input(s): HGBA1C in the last 72 hours. CBG: No results for input(s): GLUCAP in the last 168 hours. Lipid Profile: No results for input(s): CHOL, HDL, LDLCALC, TRIG, CHOLHDL, LDLDIRECT in the last 72 hours. Thyroid Function Tests: No results for input(s): TSH, T4TOTAL, FREET4, T3FREE, THYROIDAB in the last 72 hours. Anemia Panel: No results for input(s): VITAMINB12, FOLATE, FERRITIN, TIBC, IRON, RETICCTPCT in the last 72 hours. Urine analysis:    Component Value Date/Time   COLORURINE YELLOW 11/09/2018 1442   APPEARANCEUR CLEAR 11/09/2018 1442   LABSPEC 1.015 11/09/2018 1442   PHURINE 7.0 11/09/2018 1442   GLUCOSEU NEGATIVE 11/09/2018 1442   HGBUR NEGATIVE 11/09/2018 1442   BILIRUBINUR NEGATIVE 11/09/2018 1442   [place] 11/09/2018 1442   PROTEINUR NEGATIVE 11/09/2018 1442   UROBILINOGEN 0.2 01/09/2015 1853   NITRITE NEGATIVE 11/09/2018 1442   LEUKOCYTESUR NEGATIVE 11/09/2018 1442   Sepsis Labs: @LABRCNTIP (procalcitonin:4,lacticidven:4)  ) Recent Results (from the past 240 hour(s))  SARS Coronavirus 2 (Performed in [place] hospital lab)     Status: None   Collection Time: 01/04/19  1:43 PM   Specimen: Nasopharyngeal Swab  Result Value Ref Range Status   SARS Coronavirus 2 NEGATIVE NEGATIVE Final    Comment: (NOTE) If result is NEGATIVE SARS-CoV-2 target nucleic acids are NOT DETECTED. The SARS-CoV-2 RNA is generally detectable in upper and lower  respiratory specimens during the acute phase of  infection. The lowest  concentration of SARS-CoV-2 viral copies this assay can detect is 250  copies / mL. A negative result does not preclude SARS-CoV-2 infection  and should not be used as the sole basis for treatment or other  patient management decisions.  A negative result may occur with  improper specimen collection / handling, submission of specimen other  than nasopharyngeal swab, presence of viral mutation(s) within the  areas targeted by this assay, and inadequate number of viral copies  (<250 copies / mL). A negative result must be combined with clinical  observations, patient history, and epidemiological information. If result is POSITIVE SARS-CoV-2 target nucleic acids are DETECTED. The SARS-CoV-2 RNA is generally detectable in upper and lower  respiratory specimens dur ing the acute phase of infection.  Positive  results are indicative of active infection with SARS-CoV-2.  Clinical  correlation with patient history and other diagnostic information is  necessary to determine patient infection status.  Positive results do  not rule out bacterial infection or co-infection with other viruses. If result is PRESUMPTIVE POSTIVE SARS-CoV-2 nucleic acids MAY BE PRESENT.   A presumptive positive result was obtained on the submitted specimen  and confirmed on repeat testing.  While 2019 novel coronavirus  (SARS-CoV-2) nucleic acids may be present in the submitted sample  additional confirmatory testing may be necessary for epidemiological  and /  or clinical management purposes  to differentiate between  SARS-CoV-2 and other Sarbecovirus currently known to infect humans.  If clinically indicated additional testing with an alternate test  methodology [phone]) is advised. The SARS-CoV-2 RNA is generally  detectable in upper and lower respiratory sp ecimens during the acute  phase of infection. The expected result is Negative. Fact Sheet for Patients:   StrictlyIdeas.no Fact Sheet for Healthcare Providers: BankingDealers.co.za This test is not yet approved or cleared by the [place] FDA and has been authorized for detection and/or diagnosis of SARS-CoV-2 by FDA under an Emergency Use Authorization (EUA).  This EUA will remain in effect (meaning this test can be used) for the duration of the COVID-19 declaration under Section 564(b)(1) of the Act, 21 U.S.C. section 360bbb-3(b)(1), unless the authorization is terminated or revoked sooner. Performed at [facility], [address], [place], [place] [address]          Radiology Studies: Dg Chest 2 View  Result Date: 01/04/2019 CLINICAL DATA:  Chest pain and epigastric abdominal pain that began acutely last night. EXAM: CHEST - 2 VIEW COMPARISON:  11/14/2018 and earlier. FINDINGS: Sternotomy for CABG and aortic valve replacement. Cardiac silhouette normal in size, unchanged. Thoracic aorta tortuous and atherosclerotic, unchanged. Hilar and mediastinal contours otherwise unremarkable. Lungs clear. Bronchovascular markings normal. Pulmonary vascularity normal. No visible pleural effusions. No pneumothorax. Degenerative changes and DISH involving the thoracic spine. Remote healed RIGHT rib fractures. IMPRESSION: No acute cardiopulmonary disease. Electronically Signed   By: Evangeline Dakin M.D.   On: 01/04/2019 11:03        Scheduled Meds:  amLODipine  5 mg Oral Daily   amoxicillin  500 mg Oral TID   aspirin EC  81 mg Oral Daily   enoxaparin (LOVENOX) injection  40 mg Subcutaneous Q24H   lisinopril  20 mg Oral BID   metoprolol succinate  25 mg Oral Daily   nitroGLYCERIN  0.5 inch Topical Q8H   timolol  1 drop Right Eye BID   Continuous Infusions:   LOS: 0 days    Time spent: 35 minutes    Dana Allan, MD  [facility] Pager #: [phone] 7PM-7AM contact night coverage as above

## 2019-01-05 NOTE — Significant Event (Signed)
Rapid Response Event Note  Overview: Time Called: 1107 Arrival Time: 1115 Event Type: Neurologic  Initial Focused Assessment: Per RN patient with an episode of "difficulty finding his words" while MD assessing patient.  When RN assessed patient he was back to normal.  RN called with concern of TIA. Upon my assessment.  He was communicating well.  NIHSS 1 mild aphasia.  He answered questions and followed commands well but when identifying objects and describing picture he had difficulty finding his words.  When asked to identify the "glove" he used 10 different incorrect words. Interventions: Code STroke called  LKW 1104 Dr Rory Percy at bedside to assess patient Stat head CT done CTA/CTP done Returned to 6E30 Symptoms waxing and waining.    Plan of Care (if not transferred): TIA alert  Neuro checks q2  VS q2 Stroke work up  Event Summary:   at    Name of Consulting Physician Notified: Rory Percy at 1135  Outcome: Stayed in room and stabalized  Event End Time: [place]  Raliegh Ip

## 2019-01-05 NOTE — Progress Notes (Signed)
ANTICOAGULATION CONSULT NOTE - Initial Consult  Pharmacy Consult for apixaban Indication: atrial fibrillation  No Known Allergies  Patient Measurements: Height: 5\' 7"  (170.2 cm) Weight: 160 lb 8 oz (72.8 kg) IBW/kg (Calculated) : 66.1  Vital Signs: BP: 146/82 (07/19 1221)  Labs: Recent Labs    01/04/19 1017 01/04/19 1210 01/05/19 0612  HGB 10.9*  --   --   HCT 34.4*  --   --   PLT 238  --   --   CREATININE 1.30*  --   --   TROPONINIHS 29* 23* 45*    Estimated Creatinine Clearance: 36.7 mL/min (A) (by C-G formula based on SCr of 1.3 mg/dL (H)).   Medical History: Past Medical History:  Diagnosis Date  . Aortic stenosis    a. 2006 s/p bioprosthetic AVR; b. 12/2018 TEE: EF 60-65%, Nl AoV fxn w/o dehiscence, regurgitation, perivalvular leak, or abscess.  . Bacteremia due to Enterococcus 09/2018   a. ? discitis vs SBE; b. 12/2018 TEE: No evidence of vegetation, nl fxn'ing bioprosthetic AoV.  . Cancer ([place])   . Carotid arterial disease ([place])    a. 10/2018 Carotid U/S: 1-39% bilat ICA dzs.  . Coronary artery disease    a. 1987 s/p CABG x 5; 05/2014 MV: EF 70%, no significant ischemia.  . Discitis of lumbar region    a. 10/2018 MRI L2-3 acute discitis osteomyelitis.  . Endocarditis    a. 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  [name] History of prostatectomy   . Hyperlipidemia   . Hypertension   . Lumbar spinal stenosis    a. 10/2018 MRI: mild L2-3 and mod L3-4 spinal canal stenosis.  [name] PAD (peripheral artery disease) ([place]) 06/06/2017  . TIA (transient ischemic attack)    a. 10/2018  . Vertebral artery occlusion, left    a. 10/2018 CTA head/neck: Occlusion of prox V4 segment of the nondominant L vertebral artery - unknown chronicity.    Medications:  Scheduled:  . amLODipine  5 mg Oral Daily  . amoxicillin  500 mg Oral TID  . aspirin EC  81 mg Oral Daily  . enoxaparin (LOVENOX) injection  40 mg  Subcutaneous Q24H  . lisinopril  20 mg Oral BID  . metoprolol tartrate  25 mg Oral Q8H  . nitroGLYCERIN  0.5 inch Topical Q8H  . timolol  1 drop Right Eye BID    Assessment: 85 yo male admitted 7/18 with 2 day history of chest pain/epigastric pain. Rapid response called 7/19 for waxing/waning expressive aphasia. NIHSS 1, CT negative for acute stroke. No tPA given. Neurology consulted, strong suspicion that events may be TIA and recommended stroke work-up.   New onset atrial fibrillation on 7/19. Pharmacy consulted for apixaban dosing for Afib. No anticoagulation PTA. CHA2DS2VASc score of 6. Hgb 10.9, plt WNL. No reported bleeding.   Goal of Therapy:  Monitor platelets by anticoagulation protocol: Yes   Plan:  Start apixaban 5 mg BID Discontinue enoxaparin 40mg  for DVT prophylaxis Monitor clinical course, S/S of bleeding and renal function for dose adjustments  Cristela Felt, PharmD PGY1 Pharmacy Resident [employer]: [phone] 01/05/2019,7:29 PM

## 2019-01-05 NOTE — Progress Notes (Addendum)
Pt in afib rhythm, EKG done. [facility] MD notified.

## 2019-01-05 NOTE — Progress Notes (Signed)
RN was told pt had a brief moment of slurred/ incomprehensible speech while cardiology assessing him. RN called chart RN and rapid response to eval pt.

## 2019-01-05 NOTE — Consult Note (Signed)
Cardiology Consultation:   Patient ID: Randy Chen MRN: [id number]; DOB: [birth year]  Admit date: 01/04/2019 Date of Consult: 01/05/2019  Primary Care Provider: [name], MD Primary Cardiologist: Kirk Ruths, MD  Primary Electrophysiologist:  None    Patient Profile:   Randy Chen is a 85 y.o. 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 Midchest and epigastric pain, 4/10 in severity with some associated SOB. Typically occurs at rest. Lasts a few seconds to just a few minutes. Sometimes can occur after a meal, sometimes can occur with laying. Can be better with tums at times, but also better with NG.    K 4.3 Cr 1.3 BUN 23 WBC 9.5 Hgb 10.9 Plt 238 Lipase 28 hstrop 29-->23-->45 COVID 19 neg CXR no acute process EKG SR, chronic inferior ST/T changes   10/2018 TTE: LVEF >70%, grade I diastolic dysfunction with very mild dynamic LVOT obstruction [place] 13 mmHg 12/18/2018 TEE: LVEF 60-65%, mild to mod MR, bioprosthetic AVR with normal function and no vegetation   Heart Pathway Score:     Past Medical History:  Diagnosis Date  . Aortic stenosis    a. 2006 s/p bioprosthetic AVR; b. 12/2018 TEE: EF 60-65%, Nl AoV fxn w/o dehiscence, regurgitation, perivalvular leak, or abscess.  . Bacteremia due to Enterococcus 09/2018   a. ? discitis vs SBE; b. 12/2018  TEE: No evidence of vegetation, nl fxn'ing bioprosthetic AoV.  . Cancer ([place])   . Carotid arterial disease ([place])    a. 10/2018 Carotid U/S: 1-39% bilat ICA dzs.  . Coronary artery disease    a. 1987 s/p CABG x 5; 05/2014 MV: EF 70%, no significant ischemia.  . Discitis of lumbar region    a. 10/2018 MRI L2-3 acute discitis osteomyelitis.  . Endocarditis    a. 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  [name] History of prostatectomy   . Hyperlipidemia   . Hypertension   . Lumbar spinal stenosis    a. 10/2018 MRI: mild L2-3 and mod L3-4 spinal canal stenosis.  [name] PAD (peripheral artery disease) ([place]) 06/06/2017  . TIA (transient ischemic attack)    a. 10/2018  . Vertebral artery occlusion, left    a. 10/2018 CTA head/neck: Occlusion of prox V4 segment of the nondominant L vertebral artery - unknown chronicity.    Past Surgical History:  Procedure Laterality Date  . CARDIAC CATHETERIZATION     EF of 66%  . CARDIAC VALVE REPLACEMENT  2006  . CORONARY ARTERY BYPASS GRAFT  1987   x5  . I&D EXTREMITY Left 01/10/2015   Procedure: DEBRIDEMENT ANKLE PLACEMENT ANTIBIOTIC BEADS AND WOUND VAC;  Surgeon: [name], MD;  Location: [facility];  Service: Orthopedics;  Laterality: Left;  . IR LUMBAR [place] W/IMG GUIDE  11/10/2018  . TEE WITHOUT CARDIOVERSION N/A  12/18/2018   Procedure: TRANSESOPHAGEAL ECHOCARDIOGRAM (TEE);  Surgeon: Jerline Pain, MD;  Location[facility] ENDOSCOPY;  Service: Cardiovascular;  Laterality: N/A;     Inpatient Medications: Scheduled Meds: . amLODipine  5 mg Oral Daily  . amoxicillin  500 mg Oral TID  . aspirin EC  81 mg Oral Daily  . enoxaparin (LOVENOX) injection  40 mg Subcutaneous Q24H  . lisinopril  20 mg Oral BID  . metoprolol succinate  25 mg Oral Daily  . nitroGLYCERIN  0.5 inch Topical Q8H  . timolol  1 drop Right Eye BID   Continuous Infusions:  PRN Meds: acetaminophen **OR**  acetaminophen, HYDROcodone-acetaminophen, morphine injection, ondansetron **OR** ondansetron (ZOFRAN) IV, zolpidem  Allergies:   No Known Allergies  Social History:   Social History   Socioeconomic History  . Marital status: Married    Spouse name: Not on file  . Number of children: Not on file  . Years of education: Not on file  . Highest education level: Not on file  Occupational History  . Not on file  Social Needs  . Financial resource strain: Not on file  . Food insecurity    Worry: Not on file    Inability: Not on file  . Transportation needs    Medical: Not on file    Non-medical: Not on file  Tobacco Use  . Smoking status: Former Smoker    Quit date: 11/18/1955    Years since quitting: 63.1  . Smokeless tobacco: Never Used  Substance and Sexual Activity  . Alcohol use: No  . Drug use: No  . Sexual activity: Not on file  Lifestyle  . Physical activity    Days per week: Not on file    Minutes per session: Not on file  . Stress: Not on file  Relationships  . Social Herbalist on phone: Not on file    Gets together: Not on file    Attends religious service: Not on file    Active member of club or organization: Not on file    Attends meetings of clubs or organizations: Not on file    Relationship status: Not on file  . Intimate partner violence    Fear of current or ex partner: Not on file    Emotionally abused: Not on file    Physically abused: Not on file    Forced sexual activity: Not on file  Other Topics Concern  . Not on file  Social History Narrative  . Not on file    Family History:    Family History  Problem Relation Age of Onset  . Heart disease Father   . Heart failure Mother   . Heart attack Brother   . Congestive Heart Failure Sister   . Congestive Heart Failure Sister      ROS:  Please see the history of present illness.  All other ROS reviewed and negative.     Physical Exam/Data:   Vitals:   01/04/19 1657 01/04/19  2128 01/05/19 0300 01/05/19 0333  BP: (!) 162/88 (!) 137/98  (!) 153/91  Pulse: 93 92  (!) 110  Resp: (!) 21 (!) 21  (!) 21  Temp: 98 F (36.7 C) 99.2 F (37.3 C)  99.6 F (37.6 C)  TempSrc: Oral Oral  Oral  SpO2: 100% 97%  96%  Weight: 72.6 kg  72.8 kg   Height: 5\' 7"  (1.702 m)       Intake/Output Summary (Last 24 hours) at 01/05/2019  1108 Last data filed at 01/05/2019 0524 Gross per 24 hour  Intake 50 ml  Output 580 ml  Net -530 ml   Last 3 Weights 01/05/2019 01/04/2019 01/04/2019  Weight (lbs) 160 lb 8 oz 160 lb 168 lb 12.8 oz  Weight (kg) 72.802 kg 72.576 kg 76.567 kg     Body mass index is 25.14 kg/m.  General:  Elderly male, no distress HEENT: normal Lymph: no adenopathy Neck: no JVD Endocrine:  No thryomegaly Vascular: No carotid bruits; FA pulses 2+ bilaterally without bruits  Cardiac:  normal S1, S2; RRR; no murmur Lungs:  clear to auscultation bilaterally, no wheezing, rhonchi or rales  Abd: soft, nontender, no hepatomegaly  Ext: no edema Musculoskeletal:  No deformities, BUE and BLE strength normal and equal Skin: warm and dry  Neuro:  CNs 2-12 intact, no focal abnormalities noted Psych:  Normal affect     Laboratory Data:  High Sensitivity Troponin:   Recent Labs  Lab 01/04/19 1017 01/04/19 1210 01/05/19 0612  TROPONINIHS 29* 23* 45*     Cardiac EnzymesNo results for input(s): TROPONINI in the last 168 hours. No results for input(s): TROPIPOC in the last 168 hours.  Chemistry Recent Labs  Lab 01/04/19 1017  NA 138  K 4.3  CL 102  CO2 23  GLUCOSE 138*  BUN 23  CREATININE 1.30*  CALCIUM 9.5  GFRNONAA 49*  GFRAA 56*  ANIONGAP 13    No results for input(s): PROT, ALBUMIN, AST, ALT, ALKPHOS, BILITOT in the last 168 hours. Hematology Recent Labs  Lab 01/04/19 1017  WBC 9.5  RBC 3.83*  HGB 10.9*  HCT 34.4*  MCV 89.8  MCH 28.5  MCHC 31.7  RDW 14.8  PLT 238   BNPNo results for input(s): BNP, PROBNP in the last 168 hours.  DDimer No  results for input(s): DDIMER in the last 168 hours.   Radiology/Studies:  Dg Chest 2 View  Result Date: 01/04/2019 CLINICAL DATA:  Chest pain and epigastric abdominal pain that began acutely last night. EXAM: CHEST - 2 VIEW COMPARISON:  11/14/2018 and earlier. FINDINGS: Sternotomy for CABG and aortic valve replacement. Cardiac silhouette normal in size, unchanged. Thoracic aorta tortuous and atherosclerotic, unchanged. Hilar and mediastinal contours otherwise unremarkable. Lungs clear. Bronchovascular markings normal. Pulmonary vascularity normal. No visible pleural effusions. No pneumothorax. Degenerative changes and DISH involving the thoracic spine. Remote healed RIGHT rib fractures. IMPRESSION: No acute cardiopulmonary disease. Electronically Signed   By: Evangeline Dakin M.D.   On: 01/04/2019 11:03    Assessment and Plan:   1. Chest pain - history of CAD with prior CABG - chest pain/epgiastric pain. Mixed collection of symptoms, some supporting GI etiology some suggesting possible cardiac etiology. EKG with chronic ST/T changes, mild trop initially flat with decline then a +delta of 22 - recent echo with normal LVEF  - difficult to discern from available data if there is a cardiac component.  - would plan for lexiscan tomorrow not only to evaluate for ischemia but for risk stratification. I think given his advanced age and comorbidites if mild to moderate ischemia would consider medical therapy alone   2. History of endocarditis - as reported above, has been on long term oral abx - continue current regimen  3. AMS - this admit seems to have some waving and waning of mental state. On my interview he actually can give very clear precise history, but at times trails off into incoherent speech. By stopping him and refocusing him he got  right back on track giving a coherent history. Defer any workup to primary team   4. Sinus tach - no active pain or hypoxia. Mild anemia. No significant  signs of hypovolemia. Presentation and pain not consistent with PE. No over signs of infection, though UA is pending.      For questions or updates, please contact [place] Please consult [email or web address] for contact info under     Signed, Carlyle Dolly, MD  01/05/2019 11:08 AM

## 2019-01-05 NOTE — Consult Note (Addendum)
Referring Physician: Dr. Marthenia Rolling; in house code stroke via rapid response RN    Reason for Consult: In-house Code Stroke  HPI: Randy Chen is an 85 y.o. 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 He was admitted on 7/18 for CP work up. Today, his RN noted several spells of word finding difficulty. This seemed to wax/wane rapidly over an hour or so. Rapid Response RN was called to evaluate the pt and then a code stroke called. Upon emergent bedside evaluation, initially pt was speaking clearly and fluent. However near the end of exam/interview he started again having blatant expressive aphasia. NIHSS 1. This only lasted a few minutes and again returned to normal by the time we arrived to CT for stat code stroke evaluation. CTH showed no acute stroke, Negative CT perfusion study. Atherosclerotic change at both carotid bifurcations. No stenosis on the right. 30% stenosis on the left. Extensive aortic atherosclerosis.    Date last known well: 01/05/19 Time last known well: 1020  tPA Given: no, NIHSS too low (1) with wax/waning symptoms as well as diagnostic uncertinty  Past Medical History Past Medical History:  Diagnosis Date  . Aortic stenosis    a. 2006 s/p bioprosthetic AVR; b. 12/2018 TEE: EF 60-65%, Nl AoV fxn w/o dehiscence, regurgitation, perivalvular leak, or abscess.  . Bacteremia due to Enterococcus 09/2018   a. ? discitis vs SBE; b. 12/2018 TEE: No evidence of vegetation, nl fxn'ing bioprosthetic AoV.  . Cancer ([place])   . Carotid arterial disease ([place])    a. 10/2018 Carotid U/S: 1-39% bilat ICA dzs.  . Coronary artery disease    a. 1987 s/p CABG x 5; 05/2014 MV: EF 70%, no significant ischemia.  . Discitis of lumbar region    a. 10/2018 MRI L2-3 acute discitis osteomyelitis.  . Endocarditis    a. 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  [name] History of prostatectomy   . Hyperlipidemia   . Hypertension   . Lumbar spinal stenosis    a. 10/2018 MRI: mild L2-3 and mod L3-4 spinal canal stenosis.  [name] PAD (peripheral artery disease) ([place]) 06/06/2017  . TIA (transient ischemic attack)    a. 10/2018  . Vertebral artery occlusion, left    a. 10/2018 CTA head/neck: Occlusion of prox V4 segment of the nondominant L vertebral artery - unknown chronicity.    Surgical History Past Surgical History:  Procedure Laterality Date  . CARDIAC CATHETERIZATION     EF of 66%  . CARDIAC VALVE REPLACEMENT  2006  . CORONARY ARTERY BYPASS GRAFT  1987   x5  . I&D EXTREMITY Left 01/10/2015   Procedure: DEBRIDEMENT ANKLE PLACEMENT ANTIBIOTIC BEADS AND WOUND VAC;  Surgeon: [name], MD;  Location: [facility];  Service: Orthopedics;  Laterality: Left;  . IR LUMBAR [place] W/IMG GUIDE  11/10/2018  . TEE WITHOUT CARDIOVERSION N/A 12/18/2018   Procedure: TRANSESOPHAGEAL ECHOCARDIOGRAM (TEE);  Surgeon: Jerline Pain, MD;  Location: [facility] ENDOSCOPY;  Service: Cardiovascular;  Laterality: N/A;    Family History  Family History  Problem Relation Age of Onset  . Heart disease Father   . Heart failure Mother   . Heart attack Brother   . Congestive Heart Failure Sister   . Congestive Heart Failure Sister     Social History:   reports  that he quit smoking about 63 years ago. He has never used smokeless tobacco. He reports that he does not drink alcohol or use drugs.  Allergies:  No Known Allergies  Home Medications:  Medications Prior to Admission  Medication Sig Dispense Refill  . acetaminophen (TYLENOL) 500 MG tablet Take 1,000 mg by mouth every 6 (six) hours as needed (for pain).    [name] amLODipine (NORVASC) 5 MG tablet Take 5 mg by mouth daily.    [name] amoxicillin (AMOXIL) 500 MG capsule Take 1 capsule (500 mg total) by mouth 3 (three) times daily. 90  capsule 3  . ampicillin IVPB Inject 2 g into the vein every 8 (eight) hours. Indication:  Osteomyelitis, discitis Last Day of Therapy: 12/05/18 Labs - Once weekly:  CBC/D and BMP, Labs - Every other week:  ESR and CRP 30 Units 0  . aspirin EC 81 MG tablet Take 81 mg by mouth daily.    . Calcium Carbonate (CALCIUM 600 PO) Take 600 mg by mouth daily.    . Cholecalciferol (VITAMIN D-3 PO) Take 1 capsule by mouth daily.    . folic acid (FOLVITE) 378 MCG tablet Take 400 mcg by mouth 2 (two) times a week.     . Glucosamine HCl (GLUCOSAMINE PO) Take 1 tablet by mouth daily.    [name] HYDROcodone-acetaminophen (NORCO/VICODIN) 5-325 MG tablet Take 1-2 tablets by mouth every 4 (four) hours as needed for moderate pain. 30 tablet 0  . lisinopril (ZESTRIL) 40 MG tablet Take 20 mg by mouth 2 (two) times a day.    . metoprolol succinate (TOPROL-XL) 25 MG 24 hr tablet Take 25 mg by mouth daily.    . Multiple Vitamin (THERAGRAN PO) Take 1 tablet by mouth daily.    . timolol (TIMOPTIC) 0.5 % ophthalmic solution Place 1 drop into the right eye 2 (two) times daily.   3  . vitamin C (ASCORBIC ACID) 500 MG tablet Take 500 mg by mouth daily.      Hospital Medications . amLODipine  5 mg Oral Daily  . amoxicillin  500 mg Oral TID  . aspirin EC  81 mg Oral Daily  . enoxaparin (LOVENOX) injection  40 mg Subcutaneous Q24H  . lisinopril  20 mg Oral BID  . metoprolol succinate  25 mg Oral Daily  . nitroGLYCERIN  0.5 inch Topical Q8H  . timolol  1 drop Right Eye BID    ROS:  History obtained from pt  General ROS: fatigue reported; negative for - chills, fever, night sweats, weight gain or weight loss Psychological ROS: negative for - behavioral disorder, hallucinations, memory difficulties, mood swings or suicidal ideation Ophthalmic ROS: negative for - blurry vision, double vision, eye pain or loss of vision ENT ROS: negative for - epistaxis, nasal discharge, oral lesions, sore throat, tinnitus or vertigo Allergy  and Immunology ROS: negative for - hives or itchy/watery eyes Hematological and Lymphatic ROS: negative for - bleeding problems, bruising or swollen lymph nodes Endocrine ROS: negative for - galactorrhea, hair pattern changes, polydipsia/polyuria or temperature intolerance Respiratory ROS: negative for - cough, hemoptysis, shortness of breath or wheezing Cardiovascular ROS: reports intermittant chest pain. Otherwise negative for - dyspnea on exertion, edema or irregular heartbeat Gastrointestinal ROS: negative for - abdominal pain, diarrhea, hematemesis, nausea/vomiting or stool incontinence Genito-Urinary ROS: negative for - dysuria, hematuria, incontinence or urinary frequency/urgency Musculoskeletal ROS: negative for - joint swelling or muscular weakness Neurological ROS: as noted in HPI Dermatological ROS: negative for rash and skin lesion  changes   Physical Examination:  Vitals:   01/05/19 0943 01/05/19 1013 01/05/19 1043 01/05/19 1221  BP: (!) 147/88 (!) 161/87 (!) 148/91 (!) 146/82  Pulse:      Resp:      Temp:      TempSrc:      SpO2:      Weight:      Height:        General - no acute distress Heart - Regular rate and rhythm - no murmer appreciated Lungs - Clear to auscultation  Abdomen - Soft - non tender Extremities - Distal pulses intact - no edema Skin - Warm and dry   Neurologic Examination:  Mental Status: Alert, oriented, thought content appropriate.  Speech fluent without evidence of aphasia initially, then during the end of our exam/interview he suddenly paused and started saying the wrong words. He could still follow all commands during this spell of expressive aphasia, but could not express or properly name moments after he was able to do so smoothly.  Cranial Nerves: II: Discs not visualized; Visual fields grossly normal, pupils equal, round, reactive to light III,IV, VI: ptosis not present, extra-ocular motions intact bilaterally V,VII: smile symmetric,  facial light touch sensation normal bilaterally VIII: hearing normal bilaterally IX,X: gag reflex present XI: bilateral shoulder shrug XII: midline tongue extension Motor: RUE - 5/5    LUE - 5/5   RLE - 5/5    LLE - 5/5 Tone and bulk:normal tone throughout; no atrophy noted Sensory: Light touch intact throughout, bilaterally Deep Tendon Reflexes: 2+ and symmetric throughout Plantars: Right: downgoing   Left: downgoing Cerebellar: normal finger-to-nose and normal heel-to-shin test Gait: deferred at this time.   NIHSS 1a Level of Conscious:0 1b LOC Questions: 0 1c LOC Commands: 0 2 Best Gaze: 0 3 Visual: 0 4 Facial Palsy: 0 5a Motor Arm - left: 0 5b Motor Arm - Right: 0 6a Motor Leg - Left: 0 6b Motor Leg - Right: 0 7 Limb Ataxia: 0 8 Sensory: 0 9 Best Language: 1 10 Dysarthria:0 11 Extinct. and Inattention:0 TOTAL: 1   LABORATORY STUDIES:  Basic Metabolic Panel: Recent Labs  Lab 01/04/19 1017  NA 138  K 4.3  CL 102  CO2 23  GLUCOSE 138*  BUN 23  CREATININE 1.30*  CALCIUM 9.5    Liver Function Tests: No results for input(s): AST, ALT, ALKPHOS, BILITOT, PROT, ALBUMIN in the last 168 hours. Recent Labs  Lab 01/04/19 1017  LIPASE 28   No results for input(s): AMMONIA in the last 168 hours.  CBC: Recent Labs  Lab 01/04/19 1017  WBC 9.5  HGB 10.9*  HCT 34.4*  MCV 89.8  PLT 238    Cardiac Enzymes: No results for input(s): CKTOTAL, CKMB, CKMBINDEX, TROPONINI in the last 168 hours.  BNP: Invalid input(s): POCBNP  CBG: No results for input(s): GLUCAP in the last 168 hours.  Microbiology:   Coagulation Studies: No results for input(s): LABPROT, INR in the last 72 hours.  Urinalysis: No results for input(s): COLORURINE, LABSPEC, PHURINE, GLUCOSEU, HGBUR, BILIRUBINUR, KETONESUR, PROTEINUR, UROBILINOGEN, NITRITE, LEUKOCYTESUR in the last 168 hours.  Invalid input(s): APPERANCEUR  Lipid Panel:     Component Value Date/Time   CHOL 116  11/15/2018 0430   TRIG 127 11/15/2018 0430   HDL 28 (L) 11/15/2018 0430   CHOLHDL 4.1 11/15/2018 0430   VLDL 25 11/15/2018 0430   LDLCALC 63 11/15/2018 0430    HgbA1C:  Lab Results  Component Value Date  HGBA1C 5.5 11/15/2018    Urine Drug Screen:  No results found for: LABOPIA, COCAINSCRNUR, LABBENZ, AMPHETMU, THCU, LABBARB   Alcohol Level:  No results for input(s): ETH in the last 168 hours.  Miscellaneous labs:  EKG  EKG   IMAGING: Ct Angio Head W Or Wo Contrast  Result Date: 01/05/2019 CLINICAL DATA:  Stroke presentation with aphasia. EXAM: CT ANGIOGRAPHY HEAD AND NECK CT PERFUSION BRAIN TECHNIQUE: Multidetector CT imaging of the head and neck was performed using the standard protocol during bolus administration of intravenous contrast. Multiplanar CT image reconstructions and MIPs were obtained to evaluate the vascular anatomy. Carotid stenosis measurements (when applicable) are obtained utilizing NASCET criteria, using the distal internal carotid diameter as the denominator. Multiphase CT imaging of the brain was performed following IV bolus contrast injection. Subsequent parametric perfusion maps were calculated using RAPID software. CONTRAST:  32m OMNIPAQUE IOHEXOL 350 MG/ML SOLN COMPARISON:  Head CT earlier same day. Previous imaging studies May 2020 FINDINGS: CTA NECK FINDINGS Aortic arch: Aortic atherosclerosis. No aneurysm or dissection. Branching pattern is normal without origin stenosis. Right carotid system: Common carotid artery widely patent to the bifurcation. Calcified plaque at the carotid bifurcation and ICA bulb but no stenosis. Cervical ICA widely patent. Left carotid system: Common carotid artery widely patent to the bifurcation. Calcified plaque at the carotid bifurcation and ICA bulb. Minimal diameter in the ICA bulb is 3.5 mm. Compared to a more distal cervical ICA diameter of 5 mm, this indicates a 30% stenosis. Vertebral arteries: Dominant right vertebral  artery origin is widely patent. Non dominant left vertebral artery origin shows 50% stenosis at its origin calcified plaque in the proximal extent with additional stenoses. The right vertebral artery is widely patent through the cervical region to the foramen magnum. The small left vertebral artery is patent to the level of C1 but not beyond that. Skeleton: Chronic spinal ankylosis.  No acute finding. Other neck: No mass or lymphadenopathy. Upper chest: Negative Review of the MIP images confirms the above findings CTA HEAD FINDINGS Anterior circulation: Both internal carotid arteries are patent through the skull base. There is atherosclerotic calcification in the carotid siphon regions but no stenosis greater than 30% suspected. The anterior and middle cerebral vessels are patent without proximal stenosis, aneurysm or vascular malformation. No large or medium vessel occlusion identified. Posterior circulation: Right vertebral artery is widely patent to the basilar. Left vertebral artery is occluded at the C1 level with no antegrade flow through the foramen magnum or in the V4 segment. I think there is retrograde flow back to left PICA in the severely diseased distal left vertebral artery. Basilar artery shows mild atherosclerotic irregularity but no flow limiting stenosis. Superior cerebellar and posterior cerebral arteries are patent. Large posterior communicating artery on the left. Venous sinuses: Patent and normal. Anatomic variants: None significant. Delayed phase: Not performed. Review of the MIP images confirms the above findings CT Brain Perfusion Findings: ASPECTS: 10 CBF (<30%) Volume: 013mPerfusion (Tmax>6.0s) volume: 65m37mismatch Volume: 65mL665mfarction Location:None IMPRESSION: Negative CT perfusion study. Atherosclerotic change at both carotid bifurcations. No stenosis on the right. 30% stenosis on the left. Extensive aortic atherosclerosis. Non dominant left vertebral artery shows proximal stenoses and  occlusion at the C1 level. Retrograde flow in the distal left vertebral artery back to PICA. No sign of intracranial branch vessel occlusion. These results were communicated to Dr. ArorRory Percy12:27 pmon 7/19/2020by text page via the [facility] system. Electronically Signed   By: MarkElta Guadeloupe  Shogry M.D.   On: 01/05/2019 12:29   Dg Chest 2 View  Result Date: 01/04/2019 CLINICAL DATA:  Chest pain and epigastric abdominal pain that began acutely last night. EXAM: CHEST - 2 VIEW COMPARISON:  11/14/2018 and earlier. FINDINGS: Sternotomy for CABG and aortic valve replacement. Cardiac silhouette normal in size, unchanged. Thoracic aorta tortuous and atherosclerotic, unchanged. Hilar and mediastinal contours otherwise unremarkable. Lungs clear. Bronchovascular markings normal. Pulmonary vascularity normal. No visible pleural effusions. No pneumothorax. Degenerative changes and DISH involving the thoracic spine. Remote healed RIGHT rib fractures. IMPRESSION: No acute cardiopulmonary disease. Electronically Signed   By: Evangeline Dakin M.D.   On: 01/04/2019 11:03   Ct Angio Neck W Or Wo Contrast  Result Date: 01/05/2019 CLINICAL DATA:  Stroke presentation with aphasia. EXAM: CT ANGIOGRAPHY HEAD AND NECK CT PERFUSION BRAIN TECHNIQUE: Multidetector CT imaging of the head and neck was performed using the standard protocol during bolus administration of intravenous contrast. Multiplanar CT image reconstructions and MIPs were obtained to evaluate the vascular anatomy. Carotid stenosis measurements (when applicable) are obtained utilizing NASCET criteria, using the distal internal carotid diameter as the denominator. Multiphase CT imaging of the brain was performed following IV bolus contrast injection. Subsequent parametric perfusion maps were calculated using RAPID software. CONTRAST:  34m OMNIPAQUE IOHEXOL 350 MG/ML SOLN COMPARISON:  Head CT earlier same day. Previous imaging studies May 2020 FINDINGS: CTA NECK FINDINGS  Aortic arch: Aortic atherosclerosis. No aneurysm or dissection. Branching pattern is normal without origin stenosis. Right carotid system: Common carotid artery widely patent to the bifurcation. Calcified plaque at the carotid bifurcation and ICA bulb but no stenosis. Cervical ICA widely patent. Left carotid system: Common carotid artery widely patent to the bifurcation. Calcified plaque at the carotid bifurcation and ICA bulb. Minimal diameter in the ICA bulb is 3.5 mm. Compared to a more distal cervical ICA diameter of 5 mm, this indicates a 30% stenosis. Vertebral arteries: Dominant right vertebral artery origin is widely patent. Non dominant left vertebral artery origin shows 50% stenosis at its origin calcified plaque in the proximal extent with additional stenoses. The right vertebral artery is widely patent through the cervical region to the foramen magnum. The small left vertebral artery is patent to the level of C1 but not beyond that. Skeleton: Chronic spinal ankylosis.  No acute finding. Other neck: No mass or lymphadenopathy. Upper chest: Negative Review of the MIP images confirms the above findings CTA HEAD FINDINGS Anterior circulation: Both internal carotid arteries are patent through the skull base. There is atherosclerotic calcification in the carotid siphon regions but no stenosis greater than 30% suspected. The anterior and middle cerebral vessels are patent without proximal stenosis, aneurysm or vascular malformation. No large or medium vessel occlusion identified. Posterior circulation: Right vertebral artery is widely patent to the basilar. Left vertebral artery is occluded at the C1 level with no antegrade flow through the foramen magnum or in the V4 segment. I think there is retrograde flow back to left PICA in the severely diseased distal left vertebral artery. Basilar artery shows mild atherosclerotic irregularity but no flow limiting stenosis. Superior cerebellar and posterior cerebral  arteries are patent. Large posterior communicating artery on the left. Venous sinuses: Patent and normal. Anatomic variants: None significant. Delayed phase: Not performed. Review of the MIP images confirms the above findings CT Brain Perfusion Findings: ASPECTS: 10 CBF (<30%) Volume: 045mPerfusion (Tmax>6.0s) volume: 48m12mismatch Volume: 48mL148mfarction Location:None IMPRESSION: Negative CT perfusion study. Atherosclerotic change at both carotid bifurcations.  No stenosis on the right. 30% stenosis on the left. Extensive aortic atherosclerosis. Non dominant left vertebral artery shows proximal stenoses and occlusion at the C1 level. Retrograde flow in the distal left vertebral artery back to PICA. No sign of intracranial branch vessel occlusion. These results were communicated to Dr. Rory Percy at 12:27 pmon 7/19/2020by text page via the [facility] messaging system. Electronically Signed   By: Nelson Chimes M.D.   On: 01/05/2019 12:29   Ct Cerebral Perfusion W Contrast  Result Date: 01/05/2019 CLINICAL DATA:  Stroke presentation with aphasia. EXAM: CT ANGIOGRAPHY HEAD AND NECK CT PERFUSION BRAIN TECHNIQUE: Multidetector CT imaging of the head and neck was performed using the standard protocol during bolus administration of intravenous contrast. Multiplanar CT image reconstructions and MIPs were obtained to evaluate the vascular anatomy. Carotid stenosis measurements (when applicable) are obtained utilizing NASCET criteria, using the distal internal carotid diameter as the denominator. Multiphase CT imaging of the brain was performed following IV bolus contrast injection. Subsequent parametric perfusion maps were calculated using RAPID software. CONTRAST:  52m OMNIPAQUE IOHEXOL 350 MG/ML SOLN COMPARISON:  Head CT earlier same day. Previous imaging studies May 2020 FINDINGS: CTA NECK FINDINGS Aortic arch: Aortic atherosclerosis. No aneurysm or dissection. Branching pattern is normal without origin stenosis. Right carotid  system: Common carotid artery widely patent to the bifurcation. Calcified plaque at the carotid bifurcation and ICA bulb but no stenosis. Cervical ICA widely patent. Left carotid system: Common carotid artery widely patent to the bifurcation. Calcified plaque at the carotid bifurcation and ICA bulb. Minimal diameter in the ICA bulb is 3.5 mm. Compared to a more distal cervical ICA diameter of 5 mm, this indicates a 30% stenosis. Vertebral arteries: Dominant right vertebral artery origin is widely patent. Non dominant left vertebral artery origin shows 50% stenosis at its origin calcified plaque in the proximal extent with additional stenoses. The right vertebral artery is widely patent through the cervical region to the foramen magnum. The small left vertebral artery is patent to the level of C1 but not beyond that. Skeleton: Chronic spinal ankylosis.  No acute finding. Other neck: No mass or lymphadenopathy. Upper chest: Negative Review of the MIP images confirms the above findings CTA HEAD FINDINGS Anterior circulation: Both internal carotid arteries are patent through the skull base. There is atherosclerotic calcification in the carotid siphon regions but no stenosis greater than 30% suspected. The anterior and middle cerebral vessels are patent without proximal stenosis, aneurysm or vascular malformation. No large or medium vessel occlusion identified. Posterior circulation: Right vertebral artery is widely patent to the basilar. Left vertebral artery is occluded at the C1 level with no antegrade flow through the foramen magnum or in the V4 segment. I think there is retrograde flow back to left PICA in the severely diseased distal left vertebral artery. Basilar artery shows mild atherosclerotic irregularity but no flow limiting stenosis. Superior cerebellar and posterior cerebral arteries are patent. Large posterior communicating artery on the left. Venous sinuses: Patent and normal. Anatomic variants: None  significant. Delayed phase: Not performed. Review of the MIP images confirms the above findings CT Brain Perfusion Findings: ASPECTS: 10 CBF (<30%) Volume: 048mPerfusion (Tmax>6.0s) volume: 4m84mismatch Volume: 4mL28mfarction Location:None IMPRESSION: Negative CT perfusion study. Atherosclerotic change at both carotid bifurcations. No stenosis on the right. 30% stenosis on the left. Extensive aortic atherosclerosis. Non dominant left vertebral artery shows proximal stenoses and occlusion at the C1 level. Retrograde flow in the distal left vertebral artery back to PICA.  No sign of intracranial branch vessel occlusion. These results were communicated to Dr. Rory Percy at 12:27 pmon 7/19/2020by text page via the [facility] messaging system. Electronically Signed   By: Nelson Chimes M.D.   On: 01/05/2019 12:29   Ct Head Code Stroke Wo Contrast  Result Date: 01/05/2019 CLINICAL DATA:  Code stroke.  Aphasia EXAM: CT HEAD WITHOUT CONTRAST TECHNIQUE: Contiguous axial images were obtained from the base of the skull through the vertex without intravenous contrast. COMPARISON:  CT and MRI studies May of this year. FINDINGS: Brain: Old small vessel infarction in the right cerebellum. Cerebral hemispheres show extensive chronic small-vessel ischemic changes affecting the deep and subcortical white matter. No sign of acute infarction by CT. No mass, hemorrhage, hydrocephalus or extra-axial collection. Vascular: There is atherosclerotic calcification of the major vessels at the base of the brain. Skull: Negative Sinuses/Orbits: Clear/normal Other: None ASPECTS ([place] Stroke Program Early CT Score) - Ganglionic level infarction (caudate, lentiform nuclei, internal capsule, insula, M1-M3 cortex): 7 - Supraganglionic infarction (M4-M6 cortex): 3 Total score (0-10 with 10 being normal): 10 IMPRESSION: 1. No acute finding. Chronic small-vessel ischemic changes of the hemispheric white matter. Old right cerebellar infarction. 2. ASPECTS is  10. 3. These results were communicated to Dr. Rory Percy at 11:56 amon 7/19/2020by text page via the [facility] messaging system. Electronically Signed   By: Nelson Chimes M.D.   On: 01/05/2019 11:57     TEE 7/1: EF 60%, bioprosthesis valve normal, no vegetation noted  Assessment: 85 y.o. male who was admitted for CP work up. Found this am with wax/waning expressive aphasia.  No TPA due to low NIH stroke scale No LVO for endovascular treatment Strong suspicion that these might be TIAs, hence recommending stroke work-up as below. Another differential to consider is seizure.  # Transient speech changes- ddx: TIA, seizures. Will do MRI and EEG   REC's:  EEG  MRI brain  PT consult, OT consult, Speech consult  Echocardiogram not needed; TEE just done per cards.   Continue ASA daily  Allow for permissive hypertension for the first 24-48h - only treat PRN if SBP >220 mmHg. Blood pressures can be gradually normalized to SBP<140 upon discharge  Statin Therapy - LDL goal < 70  Risk factor modification  Telemetry monitoring  Frequent neuro checks  Fall Precautions  DVT prophelaxis    Attending Neurologist's note to follow  Desiree Metzger-Cihelka, ARNP-C, ANVP-BC Pager: [phone]  Attending Neurohospitalist Addendum Patient seen and examined with APP/Resident. Agree with the history and physical as documented above. Agree with the plan as documented, which I helped formulate. I have independently reviewed the chart, obtained history, review of systems and examined the patient.I have personally reviewed pertinent head/neck/spine imaging (CT/MRI). Please feel free to call with any questions. --- Amie Portland, MD [facility] Pager: [phone]  If 7pm to 7am, please call on call as listed on AMION.

## 2019-01-05 NOTE — Progress Notes (Signed)
Received call back from Rolly Pancake, MD, MD will add apixaban and beta blocker. Updated nightshift RN on this rhythm change/ TIA

## 2019-01-06 ENCOUNTER — Inpatient Hospital Stay (HOSPITAL_COMMUNITY): Payer: Medicare Other

## 2019-01-06 DIAGNOSIS — I25119 Atherosclerotic heart disease of native coronary artery with unspecified angina pectoris: Secondary | ICD-10-CM

## 2019-01-06 DIAGNOSIS — I480 Paroxysmal atrial fibrillation: Secondary | ICD-10-CM

## 2019-01-06 DIAGNOSIS — R079 Chest pain, unspecified: Secondary | ICD-10-CM

## 2019-01-06 DIAGNOSIS — Z951 Presence of aortocoronary bypass graft: Secondary | ICD-10-CM

## 2019-01-06 DIAGNOSIS — R0789 Other chest pain: Principal | ICD-10-CM

## 2019-01-06 DIAGNOSIS — I639 Cerebral infarction, unspecified: Secondary | ICD-10-CM

## 2019-01-06 LAB — NM MYOCAR MULTI W/SPECT W/WALL MOTION / EF
Estimated workload: 1 METS
Exercise duration (min): 5 min
Exercise duration (sec): 2 s
Peak HR: 115 {beats}/min
Rest HR: 99 {beats}/min

## 2019-01-06 MED ORDER — REGADENOSON 0.4 MG/5ML IV SOLN
0.4000 mg | Freq: Once | INTRAVENOUS | Status: AC
  Administered 2019-01-06: 13:00:00 0.4 mg via INTRAVENOUS

## 2019-01-06 MED ORDER — LOPERAMIDE HCL 2 MG PO CAPS
2.0000 mg | ORAL_CAPSULE | ORAL | Status: DC | PRN
  Administered 2019-01-06 – 2019-01-07 (×2): 2 mg via ORAL
  Filled 2019-01-06 (×2): qty 1

## 2019-01-06 MED ORDER — TECHNETIUM TC 99M TETROFOSMIN IV KIT
10.0000 | PACK | Freq: Once | INTRAVENOUS | Status: AC | PRN
  Administered 2019-01-06: 10 via INTRAVENOUS

## 2019-01-06 MED ORDER — REGADENOSON 0.4 MG/5ML IV SOLN
INTRAVENOUS | Status: AC
  Administered 2019-01-06: 13:00:00 0.4 mg via INTRAVENOUS
  Filled 2019-01-06: qty 5

## 2019-01-06 MED ORDER — TECHNETIUM TC 99M TETROFOSMIN IV KIT
30.0000 | PACK | Freq: Once | INTRAVENOUS | Status: AC | PRN
  Administered 2019-01-06: 30 via INTRAVENOUS

## 2019-01-06 MED ORDER — ENSURE ENLIVE PO LIQD
237.0000 mL | Freq: Two times a day (BID) | ORAL | Status: DC
  Administered 2019-01-07: 11:00:00 237 mL via ORAL

## 2019-01-06 NOTE — Progress Notes (Signed)
Patient presented for Lexiscan. Tolerated procedure well. Pending final stress imaging result.  

## 2019-01-06 NOTE — Progress Notes (Signed)
Updated patient's daughter over the phone. 

## 2019-01-06 NOTE — Progress Notes (Signed)
PROGRESS NOTE  Randy Chen WFU:[id number] DOB: Feb 11, 1928   PCP: [name], MD  Patient is from: Home  DOA: 01/04/2019 LOS: 1  Brief Narrative / Interim history: 85 year old male with history of CAD/CABG, PAD, bioprosthetic aortic valve, HTN, HLD and prostatectomy presenting with chest pain.  High-sensitivity troponin trended from 29-23-45.  EKG with T wave changes.  Lexiscan on 7/20.   Subjective: Patient had some word finding problem and rapid response was called and cortisol appreciated.  Had CT and MRI which were nonrevealing except for retrograde flow in left vertebral artery.  He went into A. fib with RVR.  Started on p.o. metoprolol and Eliquis. He feels well today.  Denies chest pain, dyspnea, palpitation or dizziness.  Denies GI or GU symptoms.  Does not recall history of atrial fibrillation.  Oriented x4.  Objective: Vitals:   01/06/19 1248 01/06/19 1252 01/06/19 1437 01/06/19 1556  BP: (!) 119/56 (!) 101/58 134/72 134/72  Pulse:   (!) 106 61  Resp: (!) 23 (!) 24  (!) 26  Temp:    98 F (36.7 C)  TempSrc:    Oral  SpO2:    98%  Weight:      Height:        Intake/Output Summary (Last 24 hours) at 01/06/2019 1619 Last data filed at 01/06/2019 0700 Gross per 24 hour  Intake 720 ml  Output 100 ml  Net 620 ml   Filed Weights   01/04/19 1657 01/05/19 0300 01/06/19 0618  Weight: 72.6 kg 72.8 kg 72.3 kg    Examination:  GENERAL: No acute distress.  Appears well.  HEENT: MMM.  Vision and hearing grossly intact.  NECK: Supple.  No apparent JVD.  RESP:  No IWOB. Good air movement bilaterally. CVS: IR.  Normal rate.  Heart sounds normal.  ABD/GI/GU: Bowel sounds present. Soft. Non tender.  MSK/EXT:  Moves extremities. No apparent deformity or edema.  SKIN: no apparent skin lesion or wound NEURO: Awake, alert and oriented appropriately.  No gross deficit.  PSYCH: Calm. Normal affect.  ENDO:   I have personally reviewed the following labs and images:   Radiology Studies: Mr Jeri Cos Wo Contrast  Result Date: 01/05/2019 CLINICAL DATA:  Transient expressive aphasia.  Endocarditis. EXAM: MRI HEAD WITHOUT AND WITH CONTRAST TECHNIQUE: Multiplanar, multiecho pulse sequences of the brain and surrounding structures were obtained without and with intravenous contrast. CONTRAST:  7 mL Gadavist COMPARISON:  Head CT, CTA, and CT perfusion 01/05/2019 and MRI 11/14/2018 FINDINGS: Brain: There is no evidence of acute infarct, mass, midline shift, or extra-axial fluid collection. A few scattered chronic cerebral microhemorrhages are again seen. Patchy and confluent T2 hyperintensities in the cerebral white matter bilaterally are unchanged from the prior MRI and nonspecific but compatible with moderate to severe chronic small vessel ischemic disease. A small chronic right cerebellar infarct is unchanged. Post-contrast images are mildly motion degraded without abnormal intracranial enhancement identified. There is moderate cerebral atrophy. Vascular: Major intracranial vascular flow voids are preserved. Skull and upper cervical spine: Unremarkable bone marrow signal. Sinuses/Orbits: Bilateral cataract extraction. Paranasal sinuses and mastoid air cells are clear. Other: None. IMPRESSION: 1. No acute intracranial abnormality. 2. Moderate to severe chronic small vessel ischemic disease. Electronically Signed   By: Logan Bores M.D.   On: 01/05/2019 17:42   Nm Myocar Multi W/spect W/wall Motion / Ef  Result Date: 01/06/2019  Defect 1: There is a non- reversible medium defect of moderate severity present in the basal inferoseptal, basal  inferior, basal inferolateral, mid anteroseptal, mid inferoseptal, mid inferior and mid inferolateral location.. This could represent scarring  Nuclear stress EF: 52%. The left ventricular ejection fraction is mildly decreased (45-54%).  This is a low risk study. Compared to the previous myoview study in December, 2015, there are no changes.      Microbiology: Recent Results (from the past 240 hour(s))  SARS Coronavirus 2 (Performed in [place] hospital lab)     Status: None   Collection Time: 01/04/19  1:43 PM   Specimen: Nasopharyngeal Swab  Result Value Ref Range Status   SARS Coronavirus 2 NEGATIVE NEGATIVE Final    Comment: (NOTE) If result is NEGATIVE SARS-CoV-2 target nucleic acids are NOT DETECTED. The SARS-CoV-2 RNA is generally detectable in upper and lower  respiratory specimens during the acute phase of infection. The lowest  concentration of SARS-CoV-2 viral copies this assay can detect is 250  copies / mL. A negative result does not preclude SARS-CoV-2 infection  and should not be used as the sole basis for treatment or other  patient management decisions.  A negative result may occur with  improper specimen collection / handling, submission of specimen other  than nasopharyngeal swab, presence of viral mutation(s) within the  areas targeted by this assay, and inadequate number of viral copies  (<250 copies / mL). A negative result must be combined with clinical  observations, patient history, and epidemiological information. If result is POSITIVE SARS-CoV-2 target nucleic acids are DETECTED. The SARS-CoV-2 RNA is generally detectable in upper and lower  respiratory specimens dur ing the acute phase of infection.  Positive  results are indicative of active infection with SARS-CoV-2.  Clinical  correlation with patient history and other diagnostic information is  necessary to determine patient infection status.  Positive results do  not rule out bacterial infection or co-infection with other viruses. If result is PRESUMPTIVE POSTIVE SARS-CoV-2 nucleic acids MAY BE PRESENT.   A presumptive positive result was obtained on the submitted specimen  and confirmed on repeat testing.  While 2019 novel coronavirus  (SARS-CoV-2) nucleic acids may be present in the submitted sample  additional confirmatory testing  may be necessary for epidemiological  and / or clinical management purposes  to differentiate between  SARS-CoV-2 and other Sarbecovirus currently known to infect humans.  If clinically indicated additional testing with an alternate test  methodology [phone]) is advised. The SARS-CoV-2 RNA is generally  detectable in upper and lower respiratory sp ecimens during the acute  phase of infection. The expected result is Negative. Fact Sheet for Patients:  StrictlyIdeas.no Fact Sheet for Healthcare Providers: BankingDealers.co.za This test is not yet approved or cleared by the [place] FDA and has been authorized for detection and/or diagnosis of SARS-CoV-2 by FDA under an Emergency Use Authorization (EUA).  This EUA will remain in effect (meaning this test can be used) for the duration of the COVID-19 declaration under Section 564(b)(1) of the Act, 21 U.S.C. section 360bbb-3(b)(1), unless the authorization is terminated or revoked sooner. Performed at [facility], [address], [place], [place] [address]     Sepsis Labs: Invalid input(s): PROCALCITONIN, LACTICIDVEN  Urine analysis:    Component Value Date/Time   COLORURINE YELLOW 01/05/2019 1340   APPEARANCEUR CLEAR 01/05/2019 1340   LABSPEC 1.044 (H) 01/05/2019 1340   PHURINE 6.0 01/05/2019 1340   GLUCOSEU NEGATIVE 01/05/2019 1340   HGBUR MODERATE (A) 01/05/2019 1340   BILIRUBINUR NEGATIVE 01/05/2019 [place] 01/05/2019  1340   PROTEINUR 30 (A) 01/05/2019 1340   UROBILINOGEN 0.2 01/09/2015 1853   NITRITE NEGATIVE 01/05/2019 1340   LEUKOCYTESUR NEGATIVE 01/05/2019 1340    Anemia Panel: No results for input(s): VITAMINB12, FOLATE, FERRITIN, TIBC, IRON, RETICCTPCT in the last 72 hours.  Thyroid Function Tests: No results for input(s): TSH, T4TOTAL, FREET4, T3FREE, THYROIDAB in the last 72 hours.  Lipid Profile: No results for input(s): CHOL,  HDL, LDLCALC, TRIG, CHOLHDL, LDLDIRECT in the last 72 hours.  CBG: No results for input(s): GLUCAP in the last 168 hours.  HbA1C: No results for input(s): HGBA1C in the last 72 hours.  BNP (last 3 results): No results for input(s): PROBNP in the last 8760 hours.  Cardiac Enzymes: No results for input(s): CKTOTAL, CKMB, CKMBINDEX, TROPONINI in the last 168 hours.  Coagulation Profile: No results for input(s): INR, PROTIME in the last 168 hours.  Liver Function Tests: No results for input(s): AST, ALT, ALKPHOS, BILITOT, PROT, ALBUMIN in the last 168 hours. Recent Labs  Lab 01/04/19 1017  LIPASE 28   No results for input(s): AMMONIA in the last 168 hours.  Basic Metabolic Panel: Recent Labs  Lab 01/04/19 1017  NA 138  K 4.3  CL 102  CO2 23  GLUCOSE 138*  BUN 23  CREATININE 1.30*  CALCIUM 9.5   GFR: Estimated Creatinine Clearance: 36.7 mL/min (A) (by C-G formula based on SCr of 1.3 mg/dL (H)).  CBC: Recent Labs  Lab 01/04/19 1017  WBC 9.5  HGB 10.9*  HCT 34.4*  MCV 89.8  PLT 238    Procedures:  EEG  Microbiology summarized: COVID-19 negative.  Assessment & Plan: Atypical chest pain in patient with CAD/CABG/PAD: Resolved.  TW changes on EKG.  Mild elevation of troponin. -Myoview on 7/20 with nonreversible medium defect of moderate severity considered to be low risk compared to his previous study in 2015 -Continue beta-blocker, lisinopril, Eliquis, aspirin -LDL 63.  New paroxysmal atrial fibrillation: Rate controlled with metoprolol.  Chads 2 vas score 6.  Echo with EF of 60 to 65% with no source of embolus.  AV prosthesis normal. -Continue p.o. 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 -Neuro exam reassuring. -CT head without contrast, CT cerebral perfusion, CTA  head/neck and MRI brain without acute finding but retrograde flow in left vertebral artery. -EEG without epileptiform discharge. -Appreciate neurology input -PT/OT eval  Hypertension: Normotensive -Continue amlodipine, metoprolol and lisinopril  DVT prophylaxis: Eliquis Code Status: DNR/DNI Family Communication: Patient and/or RN. Available if any question.  Disposition Plan: Anticipate discharge in the next 24 hours Consultants: Neurology, cardiology   Antimicrobials: Anti-infectives (From admission, onward)   Start     Dose/Rate Route Frequency Ordered Stop   01/04/19 1800  amoxicillin (AMOXIL) capsule 500 mg     500 mg Oral 3 times daily 01/04/19 1750        Sch Meds:  Scheduled Meds: . amLODipine  5 mg Oral Daily  . amoxicillin  500 mg Oral TID  . apixaban  5 mg Oral BID  . lisinopril  20 mg Oral BID  . metoprolol tartrate  25 mg Oral Q8H  . nitroGLYCERIN  0.5 inch Topical Q8H  . timolol  1 drop Right Eye BID   Continuous Infusions: PRN Meds:.acetaminophen **OR** acetaminophen, HYDROcodone-acetaminophen, loperamide, morphine injection, ondansetron **OR** ondansetron (ZOFRAN) IV, zolpidem  [place]a [address] [place]  If 7PM-7AM,  please contact night-coverage [email or web address] Password [email or web address] 01/06/2019, 4:19 PM

## 2019-01-06 NOTE — Discharge Instructions (Signed)

## 2019-01-06 NOTE — Progress Notes (Signed)
EEG completed, results pending. 

## 2019-01-06 NOTE — Progress Notes (Signed)
Lexiscan portion of test complete. Patient in no distress. 

## 2019-01-06 NOTE — Progress Notes (Signed)
STROKE TEAM PROGRESS NOTE   INTERVAL HISTORY I have personally reviewed history of presenting illness with the patient in details.  He presented with recurrent transient episodes of speech difficulties with word finding difficulties which waxed off and on for an hour and recovered.  He states he had somewhat similar episode a few weeks ago which lasted several days.  Brain imaging has been negative for acute ischemia.  EEG study was normal.  Upon further inquiry patient does admit to mild cognitive difficulties and it is unclear whether speech difficulties may represent early semantic aphasia.  He states he has been compliant with his medication regimen  Vitals:   01/06/19 0944 01/06/19 1230 01/06/19 1248 01/06/19 1252  BP: 115/68 137/64 (!) 119/56 (!) 101/58  Pulse:      Resp:  (!) 25 (!) 23 (!) 24  Temp:      TempSrc:      SpO2:      Weight:      Height:        CBC:  Recent Labs  Lab 01/04/19 1017  WBC 9.5  HGB 10.9*  HCT 34.4*  MCV 89.8  PLT 222    Basic Metabolic Panel:  Recent Labs  Lab 01/04/19 1017  NA 138  K 4.3  CL 102  CO2 23  GLUCOSE 138*  BUN 23  CREATININE 1.30*  CALCIUM 9.5   Lipid Panel:     Component Value Date/Time   CHOL 116 11/15/2018 0430   TRIG 127 11/15/2018 0430   HDL 28 (L) 11/15/2018 0430   CHOLHDL 4.1 11/15/2018 0430   VLDL 25 11/15/2018 0430   LDLCALC 63 11/15/2018 0430   HgbA1c:  Lab Results  Component Value Date   HGBA1C 5.5 11/15/2018     PHYSICAL EXAM Pleasant elderly Caucasian male not in distress. . Afebrile. Head is nontraumatic. Neck is supple without bruit.    Cardiac exam no murmur or gallop. Lungs are clear to auscultation. Distal pulses are well felt. Neurological Exam ;  Awake  Alert oriented x 3.  Mostly fluent l speech and only occasional word hesitancy.  Able to name repeat comprehend quite well.  Diminished attention, registration.  Poor recall 0/3.  Able to name only 5 animals with 4 legs.  Eye movements full  without nystagmus.fundi were not visualized. Vision acuity and fields appear normal. Hearing is normal. Palatal movements are normal. Face symmetric. Tongue midline. Normal strength, tone, reflexes and coordination. Normal sensation. Gait deferred.  ASSESSMENT/PLAN Mr. GEVORK AYYAD is a 85 y.o. 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   SZ vs Degenerative disorder (doubt TIA d/t new AF as this is the second event with the same symptoms)   Resultant expressive aphasia with decreased recall and memory   Code Stroke CT head No acute abnormality. Small vessel disease. Old R cerebellar infarct. ASPECTS 10.     CTA head & neck atherosclerosis bilateral carotid bifurcations.  L ICA 30% stenosis.  Extensive aortic atherosclerosis.  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  CT perfusion negative  MRI no acute abnormality.  Moderate to severe small vessel disease  2D Echo EF 60 to 65% with no source of embolus.  AV prosthesis normal  EEG no seizure  LDL 63  HgbA1c 5.5  Apixaban for VTE prophylaxis  aspirin 81 mg daily prior to admission, now on aspirin 81 mg daily and Eliquis (apixaban) daily.  Agree with continuing both at discharge   Therapy recommendations:  Will get SLP to assess cognition  Disposition:  pending   Paroxysmal atrial Fibrillation, new dx in hospital  Started on lopressor (on metoprolol PTA) and Eliquis (apixaban) daily   CHA2DS2-VASc Score = 6, ?2 oral anticoagulation recommended . Agree with continue Eliquis (apixaban) daily at discharge alone with aspirin    Aortic stenosis status post bioprosthetic AVR in 2006   Continue aspirin given AVR  He is on long-term antibiotics given history of endocarditis  Echo  12/18/2018 normal AV prosthesis  On aspirin 81  History of endocarditis   He is on long-term antibiotics given history of endocarditis  Hypertension   Stable . BP goal normotensive from stroke standpoint  Hyperlipidemia  LDL 63, on no statin PTA, at goal LDL < 70.   No indication for statin  Other Stroke Risk Factors  Advanced age  Former Cigarette smoker, quit 79 yrs ago  Hx stroke/TIA  10/2018 TIA  Coronary artery disease s/p CABG  PAD  Other Active Problems  Chest pain/epigastric pain  Vertebral discitis  Possibly previously undiagnosed cognitive deficit versus acute encephalopathy.  SLP consulted for cognitive assessment   Hospital day # 1  I have personally obtained history,examined this patient, reviewed notes, independently viewed imaging studies, participated in medical decision making and plan of care.ROS completed by me personally and pertinent positives fully documented  I have made any additions or clarifications directly to the above note.  He presented with recurrent transient episodes of expressive language difficulties in the setting of new onset A. fib it would be unusual for recurrent episodes 2 weeks apart to be exactly the same for a TIA but given the fact that he has new onset A. fib I agree with Eliquis for long-term anticoagulation.  Discontinue aspirin as I see no added benefit but increased bleeding risk with it.  Maintain aggressive risk factor modification.  Discussed with Dr. Cyndia Skeeters.  Stroke team will sign off.  Follow-up as an outpatient in the stroke clinic.  Greater than 50% time during this 25-minute visit was spent on counseling and coordination of care about his expressive language difficulties, TIA and atrial fibrillation discussion and answering questions  Antony Contras, MD Medical Director [place] Pager: [phone] 01/06/2019 3:23 PM   To contact Stroke Continuity provider, please refer to [email or web address] After hours,  contact General Neurology

## 2019-01-06 NOTE — Plan of Care (Signed)
  Problem: Clinical Measurements: ?Goal: Cardiovascular complication will be avoided ?Outcome: Progressing ?  ?Problem: Activity: ?Goal: Risk for activity intolerance will decrease ?Outcome: Progressing ?  ?Problem: Nutrition: ?Goal: Adequate nutrition will be maintained ?Outcome: Progressing ?  ?Problem: Safety: ?Goal: Ability to remain free from injury will improve ?Outcome: Progressing ?  ?

## 2019-01-06 NOTE — Progress Notes (Signed)
Progress Note  Patient Name: Randy Chen Date of Encounter: 01/06/2019  Primary Cardiologist: Kirk Ruths, MD   Subjective   Patient went into atrial fibrillation yesterday and was started on Lopressor and Eliquis.   No acute events overnight. He denies any recent chest pain. He is planned for EEG. Stress test ordered today and he is NPO. He does endorse a history of TIA with some wording finding ability in the past.  Inpatient Medications    Scheduled Meds:  amLODipine  5 mg Oral Daily   amoxicillin  500 mg Oral TID   apixaban  5 mg Oral BID   aspirin EC  81 mg Oral Daily   lisinopril  20 mg Oral BID   metoprolol tartrate  25 mg Oral Q8H   nitroGLYCERIN  0.5 inch Topical Q8H   timolol  1 drop Right Eye BID   Continuous Infusions:  PRN Meds: acetaminophen **OR** acetaminophen, HYDROcodone-acetaminophen, morphine injection, ondansetron **OR** ondansetron (ZOFRAN) IV, zolpidem   Vital Signs    Vitals:   01/05/19 2143 01/05/19 2147 01/06/19 0618 01/06/19 0944  BP:  138/64 140/69 115/68  Pulse: (!) 108  (!) 104   Resp:  (!) 25 19   Temp:   98 F (36.7 C)   TempSrc:   Oral   SpO2:   97%   Weight:   72.3 kg   Height:        Intake/Output Summary (Last 24 hours) at 01/06/2019 1009 Last data filed at 01/06/2019 0700 Gross per 24 hour  Intake 780 ml  Output 400 ml  Net 380 ml   Last 3 Weights 01/06/2019 01/05/2019 01/04/2019  Weight (lbs) 159 lb 6.4 oz 160 lb 8 oz 160 lb  Weight (kg) 72.303 kg 72.802 kg 72.576 kg      Telemetry    SR with PACs, some afib, and occasional PVCs - Personally Reviewed  ECG    No new ECG tracing available today. - Personally Reviewed  Physical Exam   Physical Exam per MD:  GEN: No acute distress.  Resting comfortably in bed Neck: Supple. No JVD lying at 30 degrees Cardiac: largely regular but frequent premature beats. 2/6 SEM with preserved S2. DP, PT, and radial pulses palpable Respiratory: Clear to  auscultation bilaterally with R basilar fine crackles that improve with deep inspiration. GI: Soft, non-tender, non-distended. MS: No edema. No deformity. Neuro:  No focal deficits. Psych: Normal affect.  Labs    High Sensitivity Troponin:   Recent Labs  Lab 01/04/19 1017 01/04/19 1210 01/05/19 0612  TROPONINIHS 29* 23* 45*      Cardiac EnzymesNo results for input(s): TROPONINI in the last 168 hours. No results for input(s): TROPIPOC in the last 168 hours.   Chemistry Recent Labs  Lab 01/04/19 1017  NA 138  K 4.3  CL 102  CO2 23  GLUCOSE 138*  BUN 23  CREATININE 1.30*  CALCIUM 9.5  GFRNONAA 49*  GFRAA 56*  ANIONGAP 13     Hematology Recent Labs  Lab 01/04/19 1017  WBC 9.5  RBC 3.83*  HGB 10.9*  HCT 34.4*  MCV 89.8  MCH 28.5  MCHC 31.7  RDW 14.8  PLT 238    BNPNo results for input(s): BNP, PROBNP in the last 168 hours.   DDimer No results for input(s): DDIMER in the last 168 hours.   Radiology    Ct Angio Head W Or Wo Contrast  Result Date: 01/05/2019 CLINICAL DATA:  Stroke presentation with aphasia.  EXAM: CT ANGIOGRAPHY HEAD AND NECK CT PERFUSION BRAIN TECHNIQUE: Multidetector CT imaging of the head and neck was performed using the standard protocol during bolus administration of intravenous contrast. Multiplanar CT image reconstructions and MIPs were obtained to evaluate the vascular anatomy. Carotid stenosis measurements (when applicable) are obtained utilizing NASCET criteria, using the distal internal carotid diameter as the denominator. Multiphase CT imaging of the brain was performed following IV bolus contrast injection. Subsequent parametric perfusion maps were calculated using RAPID software. CONTRAST:  55mL OMNIPAQUE IOHEXOL 350 MG/ML SOLN COMPARISON:  Head CT earlier same day. Previous imaging studies May 2020 FINDINGS: CTA NECK FINDINGS Aortic arch: Aortic atherosclerosis. No aneurysm or dissection. Branching pattern is normal without origin  stenosis. Right carotid system: Common carotid artery widely patent to the bifurcation. Calcified plaque at the carotid bifurcation and ICA bulb but no stenosis. Cervical ICA widely patent. Left carotid system: Common carotid artery widely patent to the bifurcation. Calcified plaque at the carotid bifurcation and ICA bulb. Minimal diameter in the ICA bulb is 3.5 mm. Compared to a more distal cervical ICA diameter of 5 mm, this indicates a 30% stenosis. Vertebral arteries: Dominant right vertebral artery origin is widely patent. Non dominant left vertebral artery origin shows 50% stenosis at its origin calcified plaque in the proximal extent with additional stenoses. The right vertebral artery is widely patent through the cervical region to the foramen magnum. The small left vertebral artery is patent to the level of C1 but not beyond that. Skeleton: Chronic spinal ankylosis.  No acute finding. Other neck: No mass or lymphadenopathy. Upper chest: Negative Review of the MIP images confirms the above findings CTA HEAD FINDINGS Anterior circulation: Both internal carotid arteries are patent through the skull base. There is atherosclerotic calcification in the carotid siphon regions but no stenosis greater than 30% suspected. The anterior and middle cerebral vessels are patent without proximal stenosis, aneurysm or vascular malformation. No large or medium vessel occlusion identified. Posterior circulation: Right vertebral artery is widely patent to the basilar. Left vertebral artery is occluded at the C1 level with no antegrade flow through the foramen magnum or in the V4 segment. I think there is retrograde flow back to left PICA in the severely diseased distal left vertebral artery. Basilar artery shows mild atherosclerotic irregularity but no flow limiting stenosis. Superior cerebellar and posterior cerebral arteries are patent. Large posterior communicating artery on the left. Venous sinuses: Patent and normal.  Anatomic variants: None significant. Delayed phase: Not performed. Review of the MIP images confirms the above findings CT Brain Perfusion Findings: ASPECTS: 10 CBF (<30%) Volume: 65mL Perfusion (Tmax>6.0s) volume: 34mL Mismatch Volume: 93mL Infarction Location:None IMPRESSION: Negative CT perfusion study. Atherosclerotic change at both carotid bifurcations. No stenosis on the right. 30% stenosis on the left. Extensive aortic atherosclerosis. Non dominant left vertebral artery shows proximal stenoses and occlusion at the C1 level. Retrograde flow in the distal left vertebral artery back to PICA. No sign of intracranial branch vessel occlusion. These results were communicated to Dr. Rory Percy at 12:27 pmon 7/19/2020by text page via the [facility] messaging system. Electronically Signed   By: Nelson Chimes M.D.   On: 01/05/2019 12:29   Dg Chest 2 View  Result Date: 01/04/2019 CLINICAL DATA:  Chest pain and epigastric abdominal pain that began acutely last night. EXAM: CHEST - 2 VIEW COMPARISON:  11/14/2018 and earlier. FINDINGS: Sternotomy for CABG and aortic valve replacement. Cardiac silhouette normal in size, unchanged. Thoracic aorta tortuous and atherosclerotic, unchanged. Hilar and  mediastinal contours otherwise unremarkable. Lungs clear. Bronchovascular markings normal. Pulmonary vascularity normal. No visible pleural effusions. No pneumothorax. Degenerative changes and DISH involving the thoracic spine. Remote healed RIGHT rib fractures. IMPRESSION: No acute cardiopulmonary disease. Electronically Signed   By: Evangeline Dakin M.D.   On: 01/04/2019 11:03   Ct Angio Neck W Or Wo Contrast  Result Date: 01/05/2019 CLINICAL DATA:  Stroke presentation with aphasia. EXAM: CT ANGIOGRAPHY HEAD AND NECK CT PERFUSION BRAIN TECHNIQUE: Multidetector CT imaging of the head and neck was performed using the standard protocol during bolus administration of intravenous contrast. Multiplanar CT image reconstructions and MIPs were  obtained to evaluate the vascular anatomy. Carotid stenosis measurements (when applicable) are obtained utilizing NASCET criteria, using the distal internal carotid diameter as the denominator. Multiphase CT imaging of the brain was performed following IV bolus contrast injection. Subsequent parametric perfusion maps were calculated using RAPID software. CONTRAST:  71mL OMNIPAQUE IOHEXOL 350 MG/ML SOLN COMPARISON:  Head CT earlier same day. Previous imaging studies May 2020 FINDINGS: CTA NECK FINDINGS Aortic arch: Aortic atherosclerosis. No aneurysm or dissection. Branching pattern is normal without origin stenosis. Right carotid system: Common carotid artery widely patent to the bifurcation. Calcified plaque at the carotid bifurcation and ICA bulb but no stenosis. Cervical ICA widely patent. Left carotid system: Common carotid artery widely patent to the bifurcation. Calcified plaque at the carotid bifurcation and ICA bulb. Minimal diameter in the ICA bulb is 3.5 mm. Compared to a more distal cervical ICA diameter of 5 mm, this indicates a 30% stenosis. Vertebral arteries: Dominant right vertebral artery origin is widely patent. Non dominant left vertebral artery origin shows 50% stenosis at its origin calcified plaque in the proximal extent with additional stenoses. The right vertebral artery is widely patent through the cervical region to the foramen magnum. The small left vertebral artery is patent to the level of C1 but not beyond that. Skeleton: Chronic spinal ankylosis.  No acute finding. Other neck: No mass or lymphadenopathy. Upper chest: Negative Review of the MIP images confirms the above findings CTA HEAD FINDINGS Anterior circulation: Both internal carotid arteries are patent through the skull base. There is atherosclerotic calcification in the carotid siphon regions but no stenosis greater than 30% suspected. The anterior and middle cerebral vessels are patent without proximal stenosis, aneurysm or  vascular malformation. No large or medium vessel occlusion identified. Posterior circulation: Right vertebral artery is widely patent to the basilar. Left vertebral artery is occluded at the C1 level with no antegrade flow through the foramen magnum or in the V4 segment. I think there is retrograde flow back to left PICA in the severely diseased distal left vertebral artery. Basilar artery shows mild atherosclerotic irregularity but no flow limiting stenosis. Superior cerebellar and posterior cerebral arteries are patent. Large posterior communicating artery on the left. Venous sinuses: Patent and normal. Anatomic variants: None significant. Delayed phase: Not performed. Review of the MIP images confirms the above findings CT Brain Perfusion Findings: ASPECTS: 10 CBF (<30%) Volume: 22mL Perfusion (Tmax>6.0s) volume: 55mL Mismatch Volume: 102mL Infarction Location:None IMPRESSION: Negative CT perfusion study. Atherosclerotic change at both carotid bifurcations. No stenosis on the right. 30% stenosis on the left. Extensive aortic atherosclerosis. Non dominant left vertebral artery shows proximal stenoses and occlusion at the C1 level. Retrograde flow in the distal left vertebral artery back to PICA. No sign of intracranial branch vessel occlusion. These results were communicated to Dr. Rory Percy at 12:27 pmon 7/19/2020by text page via the [facility] messaging system.  Electronically Signed   By: Nelson Chimes M.D.   On: 01/05/2019 12:29   Mr Jeri Cos HG Contrast  Result Date: 01/05/2019 CLINICAL DATA:  Transient expressive aphasia.  Endocarditis. EXAM: MRI HEAD WITHOUT AND WITH CONTRAST TECHNIQUE: Multiplanar, multiecho pulse sequences of the brain and surrounding structures were obtained without and with intravenous contrast. CONTRAST:  7 mL Gadavist COMPARISON:  Head CT, CTA, and CT perfusion 01/05/2019 and MRI 11/14/2018 FINDINGS: Brain: There is no evidence of acute infarct, mass, midline shift, or extra-axial fluid  collection. A few scattered chronic cerebral microhemorrhages are again seen. Patchy and confluent T2 hyperintensities in the cerebral white matter bilaterally are unchanged from the prior MRI and nonspecific but compatible with moderate to severe chronic small vessel ischemic disease. A small chronic right cerebellar infarct is unchanged. Post-contrast images are mildly motion degraded without abnormal intracranial enhancement identified. There is moderate cerebral atrophy. Vascular: Major intracranial vascular flow voids are preserved. Skull and upper cervical spine: Unremarkable bone marrow signal. Sinuses/Orbits: Bilateral cataract extraction. Paranasal sinuses and mastoid air cells are clear. Other: None. IMPRESSION: 1. No acute intracranial abnormality. 2. Moderate to severe chronic small vessel ischemic disease. Electronically Signed   By: Logan Bores M.D.   On: 01/05/2019 17:42   Ct Cerebral Perfusion W Contrast  Result Date: 01/05/2019 CLINICAL DATA:  Stroke presentation with aphasia. EXAM: CT ANGIOGRAPHY HEAD AND NECK CT PERFUSION BRAIN TECHNIQUE: Multidetector CT imaging of the head and neck was performed using the standard protocol during bolus administration of intravenous contrast. Multiplanar CT image reconstructions and MIPs were obtained to evaluate the vascular anatomy. Carotid stenosis measurements (when applicable) are obtained utilizing NASCET criteria, using the distal internal carotid diameter as the denominator. Multiphase CT imaging of the brain was performed following IV bolus contrast injection. Subsequent parametric perfusion maps were calculated using RAPID software. CONTRAST:  86mL OMNIPAQUE IOHEXOL 350 MG/ML SOLN COMPARISON:  Head CT earlier same day. Previous imaging studies May 2020 FINDINGS: CTA NECK FINDINGS Aortic arch: Aortic atherosclerosis. No aneurysm or dissection. Branching pattern is normal without origin stenosis. Right carotid system: Common carotid artery widely  patent to the bifurcation. Calcified plaque at the carotid bifurcation and ICA bulb but no stenosis. Cervical ICA widely patent. Left carotid system: Common carotid artery widely patent to the bifurcation. Calcified plaque at the carotid bifurcation and ICA bulb. Minimal diameter in the ICA bulb is 3.5 mm. Compared to a more distal cervical ICA diameter of 5 mm, this indicates a 30% stenosis. Vertebral arteries: Dominant right vertebral artery origin is widely patent. Non dominant left vertebral artery origin shows 50% stenosis at its origin calcified plaque in the proximal extent with additional stenoses. The right vertebral artery is widely patent through the cervical region to the foramen magnum. The small left vertebral artery is patent to the level of C1 but not beyond that. Skeleton: Chronic spinal ankylosis.  No acute finding. Other neck: No mass or lymphadenopathy. Upper chest: Negative Review of the MIP images confirms the above findings CTA HEAD FINDINGS Anterior circulation: Both internal carotid arteries are patent through the skull base. There is atherosclerotic calcification in the carotid siphon regions but no stenosis greater than 30% suspected. The anterior and middle cerebral vessels are patent without proximal stenosis, aneurysm or vascular malformation. No large or medium vessel occlusion identified. Posterior circulation: Right vertebral artery is widely patent to the basilar. Left vertebral artery is occluded at the C1 level with no antegrade flow through the foramen magnum or in  the V4 segment. I think there is retrograde flow back to left PICA in the severely diseased distal left vertebral artery. Basilar artery shows mild atherosclerotic irregularity but no flow limiting stenosis. Superior cerebellar and posterior cerebral arteries are patent. Large posterior communicating artery on the left. Venous sinuses: Patent and normal. Anatomic variants: None significant. Delayed phase: Not  performed. Review of the MIP images confirms the above findings CT Brain Perfusion Findings: ASPECTS: 10 CBF (<30%) Volume: 25mL Perfusion (Tmax>6.0s) volume: 75mL Mismatch Volume: 74mL Infarction Location:None IMPRESSION: Negative CT perfusion study. Atherosclerotic change at both carotid bifurcations. No stenosis on the right. 30% stenosis on the left. Extensive aortic atherosclerosis. Non dominant left vertebral artery shows proximal stenoses and occlusion at the C1 level. Retrograde flow in the distal left vertebral artery back to PICA. No sign of intracranial branch vessel occlusion. These results were communicated to Dr. Rory Percy at 12:27 pmon 7/19/2020by text page via the [facility] messaging system. Electronically Signed   By: Nelson Chimes M.D.   On: 01/05/2019 12:29   Ct Head Code Stroke Wo Contrast  Result Date: 01/05/2019 CLINICAL DATA:  Code stroke.  Aphasia EXAM: CT HEAD WITHOUT CONTRAST TECHNIQUE: Contiguous axial images were obtained from the base of the skull through the vertex without intravenous contrast. COMPARISON:  CT and MRI studies May of this year. FINDINGS: Brain: Old small vessel infarction in the right cerebellum. Cerebral hemispheres show extensive chronic small-vessel ischemic changes affecting the deep and subcortical white matter. No sign of acute infarction by CT. No mass, hemorrhage, hydrocephalus or extra-axial collection. Vascular: There is atherosclerotic calcification of the major vessels at the base of the brain. Skull: Negative Sinuses/Orbits: Clear/normal Other: None ASPECTS ([place] Stroke Program Early CT Score) - Ganglionic level infarction (caudate, lentiform nuclei, internal capsule, insula, M1-M3 cortex): 7 - Supraganglionic infarction (M4-M6 cortex): 3 Total score (0-10 with 10 being normal): 10 IMPRESSION: 1. No acute finding. Chronic small-vessel ischemic changes of the hemispheric white matter. Old right cerebellar infarction. 2. ASPECTS is 10. 3. These results were  communicated to Dr. Rory Percy at 11:56 amon 7/19/2020by text page via the [facility] messaging system. Electronically Signed   By: Nelson Chimes M.D.   On: 01/05/2019 11:57    Cardiac Studies   Echocardiogram 12/18/2018 (TEE)  1. The left ventricle has normal systolic function, with an ejection fraction of 60-65%. The cavity size was normal. No evidence of left ventricular regional wall motion abnormalities.  2. The right ventricle has normal systolc function. The cavity was normal. There is no increase in right ventricular wall thickness.  3. Mitral valve regurgitation is mild to moderate by color flow Doppler.  4. A bioprosthesis valve is present in the aortic position. Procedure Date: 2006 Normal aortic valve prosthesis. Echo findings shows no evidence of dehiscence, regurgitation, perivalvular leak or abscess of the aortic prosthesis.  5. Aortic valve regurgitation was not assessed by color flow Doppler.  6. There is evidence of mild plaque in the descending aorta.  Patient Profile   Mr. Sangalang is a 85 y.o. male with a history of CAD with remote CABG in 1987, aortic stenosis s/p bioprosthetic AVR in 2006, PAD previously seen by Dr. 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 Cardiology was consulted for further evaluation. Patient describes the pain as mid-chest and epigastric pain for the last 3  months with some shortness of breath that typically occurs at rest. Sometimes occurs after a meal and with laying down. Improves with TUMS at times but also improves with Nitroglycerin  Assessment & Plan    Chest Pain with History of Remote CABG: no episodes overnight - EKG shows chronic ST/T changes. - High-sensitivity troponin mildly elevated at 29 >> 23 >> 45.  - Recent TEE from 12/18/2018  showed LVEF of 60-65% with no regional wall motion abnormalities. - Patient presents with chest pain/epigastric pain with mixed collection of symptoms that are difficult to discern if they are cardiac in origin. Some symptoms support GI etiology while others suggest possible cardiac involvement. Therefore, Dr. Harl Bowie planned for [facility] today for further evaluation. - if myoview shows mild-moderate ischemia, would plan to manage medically over invasive strategy - on aspirin at home, continue given AVR history - not on statin. Patient is 40. LDL 63 and has been in the 60s for >8 years. Given age, would not start for CAD indication unless severe ischemia seen or evidence of CVA.  Paroxysmal Atrial Fibrillation - Patient was noted to go into atrial fibrillation yesterday evening. - Lopressor 25mg  every 8 hours was started. Home dose was metoprolol succinate 25 mg daily.  - CHA2DS-VASc = 6 (CAD/PAD, HTN, CVA x2, age x2). Patient was started on Eliquis 5mg  twice daily.  Aortic Stenosis s/p Bioprosthetic AVR in 2006 - Most recent Echo from 12/18/2018 shows normal aortic valve prothesis with no evidence of dehiscence, regurgitation, perivalvular leak or abscess of the aortic prosthesis. - continue aspirin 81 mg given AVR - on long term ABX in place of PRN prophylaxis given history of endocarditis for now, follows with [name]  History of Endocarditis - Patient on long term oral antibiotics per [name].  Possible TIA - Patient has had recurrent episodes of expressive aphasia this admission. Code Stroke was called yesterday afternoon.  - Head CT showed no acute findings.  - Negative CT perfusion  But atherosclerotic changes were noted at both carotid bifurcations. - MRI showed moderate to severe chronic small vessel ischemic disease but no acute intracranial abnormality. - Neurology following. Per team, planned for EEG  Hypertension - Most recent BP 115/68. - Per Neurology, will  allow for permissive hypertension for 24 to 48 hours.  - on amlodipine 5 mg daily at home, continued here - on lisinopril 20 mg BID, continued here  Hyperlipidemia - Most recent lipid panel from 11/15/2018: Total Cholesterol 116, Triglycerides 127, HDL 28, LDL 63. - LDL goal <70 given CAD. At goal on no statins.  For questions or updates, please contact [place] Please consult [email or web address] for contact info under     Signed, Buford Dresser, MD  01/06/2019, 10:09 AM

## 2019-01-06 NOTE — Procedures (Signed)
History: 85 year old male being evaluated for waxing/waning aphasia  Sedation: None  Technique: This is a 21 channel routine scalp EEG performed at the bedside with bipolar and monopolar montages arranged in accordance to the international 10/20 system of electrode placement. One channel was dedicated to EKG recording.    Background: There is a relatively well-formed posterior dominant rhythm of 8 to 9 Hz.  With drowsiness, there is anterior shifting of the posterior dominant rhythm as well as an increase in slow activity.  Sleep is recorded with relatively symmetric appearing structures.  Photic stimulation: Physiologic driving is present  EEG Abnormalities: None  Clinical Interpretation: This normal EEG is recorded in the waking and sleep state. There was no seizure or seizure predisposition recorded on this study. Please note that lack of epileptiform activity on EEG does not preclude the possibility of epilepsy.   Randy Rack, MD [facility] [phone]  If 7pm- 7am, please page neurology on call as listed in [place].

## 2019-01-07 DIAGNOSIS — I10 Essential (primary) hypertension: Secondary | ICD-10-CM

## 2019-01-07 DIAGNOSIS — I491 Atrial premature depolarization: Secondary | ICD-10-CM

## 2019-01-07 LAB — CBC
HCT: 28.8 % — ABNORMAL LOW (ref 39.0–52.0)
Hemoglobin: 9.2 g/dL — ABNORMAL LOW (ref 13.0–17.0)
MCH: 28.1 pg (ref 26.0–34.0)
MCHC: 31.9 g/dL (ref 30.0–36.0)
MCV: 88.1 fL (ref 80.0–100.0)
Platelets: 188 10*3/uL (ref 150–400)
RBC: 3.27 MIL/uL — ABNORMAL LOW (ref 4.22–5.81)
RDW: 15.1 % (ref 11.5–15.5)
WBC: 13.2 10*3/uL — ABNORMAL HIGH (ref 4.0–10.5)
nRBC: 0 % (ref 0.0–0.2)

## 2019-01-07 LAB — COMPREHENSIVE METABOLIC PANEL
ALT: 18 U/L (ref 0–44)
AST: 23 U/L (ref 15–41)
Albumin: 2.6 g/dL — ABNORMAL LOW (ref 3.5–5.0)
Alkaline Phosphatase: 91 U/L (ref 38–126)
Anion gap: 10 (ref 5–15)
BUN: 33 mg/dL — ABNORMAL HIGH (ref 8–23)
CO2: 24 mmol/L (ref 22–32)
Calcium: 8.7 mg/dL — ABNORMAL LOW (ref 8.9–10.3)
Chloride: 99 mmol/L (ref 98–111)
Creatinine, Ser: 1.5 mg/dL — ABNORMAL HIGH (ref 0.61–1.24)
GFR calc Af Amer: 47 mL/min — ABNORMAL LOW (ref >60)
GFR calc non Af Amer: 41 mL/min — ABNORMAL LOW (ref >60)
Glucose, Bld: 112 mg/dL — ABNORMAL HIGH (ref 70–99)
Potassium: 3.5 mmol/L (ref 3.5–5.1)
Sodium: 133 mmol/L — ABNORMAL LOW (ref 135–145)
Total Bilirubin: 0.8 mg/dL (ref 0.3–1.2)
Total Protein: 5.8 g/dL — ABNORMAL LOW (ref 6.5–8.1)

## 2019-01-07 LAB — MAGNESIUM: Magnesium: 2 mg/dL (ref 1.7–2.4)

## 2019-01-07 MED ORDER — LOPERAMIDE HCL 2 MG PO CAPS: 2.0000 mg | ORAL_CAPSULE | Freq: Four times a day (QID) | ORAL | 0 refills | Status: DC | PRN

## 2019-01-07 MED ORDER — METOPROLOL SUCCINATE ER 100 MG PO TB24: 100.0000 mg | ORAL_TABLET | Freq: Every day | ORAL | 1 refills | Status: DC

## 2019-01-07 MED ORDER — LISINOPRIL 20 MG PO TABS: 20.0000 mg | ORAL_TABLET | Freq: Every day | ORAL | 0 refills | Status: DC

## 2019-01-07 MED ORDER — APIXABAN 5 MG PO TABS: 5.0000 mg | ORAL_TABLET | Freq: Two times a day (BID) | ORAL | 0 refills | Status: DC

## 2019-01-07 MED ORDER — METOPROLOL SUCCINATE ER 100 MG PO TB24
100.0000 mg | ORAL_TABLET | Freq: Every day | ORAL | Status: DC
  Administered 2019-01-07: 100 mg via ORAL
  Filled 2019-01-07: qty 1

## 2019-01-07 MED ORDER — POTASSIUM CHLORIDE CRYS ER 20 MEQ PO TBCR
40.0000 meq | EXTENDED_RELEASE_TABLET | Freq: Once | ORAL | Status: AC
  Administered 2019-01-07: 40 meq via ORAL
  Filled 2019-01-07: qty 2

## 2019-01-07 MED FILL — METOPROLOL SUCCINATE ER 100: 100 | 90 days supply | Qty: 90 | Fill #0

## 2019-01-07 MED FILL — ELIQUIS 5 MG TABLET: 5 | 30 days supply | Qty: 60 | Fill #0

## 2019-01-07 MED FILL — LISINOPRIL 20 MG TABLET: 20 | 90 days supply | Qty: 90 | Fill #0

## 2019-01-07 MED FILL — LOPERAMIDE 2 MG CAPSULE: 2 | 8 days supply | Qty: 30 | Fill #0

## 2019-01-07 NOTE — Evaluation (Signed)
Physical Therapy Evaluation Patient Details Name: Randy Chen MRN: [id number] DOB: [birth year] Today's Date: 01/07/2019   History of Present Illness  85 yo admitted with chest pain with Afib 7/19. Pt also with episode of transient dysarthria with negative MRI with recent episode 1 month ago. PMHx: L2-3 osteomyelitis, CAD, CABG, AVR, HTN, HLD, prostatectomy, PAD  Clinical Impression  Pt pleasant and very willing to mobilize. Pt reports having not moved much for the last few days and demonstrates increased stability and stiffness with initial standing with need for environmental support. Pt with decreased strength and balance who benefits from RW use at all times in standing and encouraged to do so upon D/C. Pt will also benefit from acute therapy to maximize mobility, gait and function to decrease fall risk. Pt reports wife in good health and available assist needed at [place].  HR 130 Afib with gait    Follow Up Recommendations No PT follow up(pt declined HHPT option)    Equipment Recommendations  None recommended by PT    Recommendations for Other Services OT consult     Precautions / Restrictions Precautions Precautions: Fall      Mobility  Bed Mobility Overal bed mobility: Needs Assistance Bed Mobility: Supine to Sit     Supine to sit: Supervision     General bed mobility comments: supervision with increased time to transition to EOB  Transfers Overall transfer level: Needs assistance   Transfers: Sit to/from Stand Sit to Stand: Min guard         General transfer comment: pt standing from bed reaching out for chair and pivoting to recliner despite cues to stand to walk. Pt then stood again and reached out for environmental support with RW provided  Ambulation/Gait Ambulation/Gait assistance: Supervision Gait Distance (Feet): 400 Feet Assistive device: Rolling walker (2 wheeled) Gait Pattern/deviations: Step-through pattern;Decreased stride length;Trunk flexed    Gait velocity interpretation: >2.62 ft/sec, indicative of community ambulatory General Gait Details: cues for posture and proximity to Rw  Stairs            Wheelchair Mobility    Modified Rankin (Stroke Patients Only)       Balance Overall balance assessment: Needs assistance   Sitting balance-Leahy Scale: Good Sitting balance - Comments: EOB without assist   Standing balance support: Single extremity supported;Bilateral upper extremity supported Standing balance-Leahy Scale: Poor Standing balance comment: single and bil UE support in standing                             Pertinent Vitals/Pain Pain Assessment: No/denies pain    Home Living Family/patient expects to be discharged to:: Private residence Living Arrangements: Spouse/significant other Available Help at Discharge: Available 24 hours/day Type of Home: Independent living facility Home Access: Level entry     Home Layout: One level Home Equipment: Walker - 2 wheels;Shower seat - built in      Prior Function Level of Independence: Independent with assistive device(s)         Comments: Uses RW for ambulation outside of home at times. Stain glass is his hobby     Hand Dominance   Dominant Hand: Right    Extremity/Trunk Assessment   Upper Extremity Assessment Upper Extremity Assessment: Generalized weakness    Lower Extremity Assessment Lower Extremity Assessment: Generalized weakness    Cervical / Trunk Assessment Cervical / Trunk Assessment: Kyphotic  Communication   Communication: No difficulties  Cognition Arousal/Alertness: Awake/alert Behavior During  Therapy: WFL for tasks assessed/performed Overall Cognitive Status: Impaired/Different from baseline Area of Impairment: Safety/judgement                         Safety/Judgement: Decreased awareness of deficits            General Comments      Exercises     Assessment/Plan    PT Assessment Patient  needs continued PT services  PT Problem List Decreased activity tolerance;Decreased balance;Decreased knowledge of use of DME;Decreased mobility       PT Treatment Interventions DME instruction;Therapeutic activities;Gait training;Functional mobility training;Balance training;Patient/family education    PT Goals (Current goals can be found in the Care Plan section)  Acute Rehab PT Goals Patient Stated Goal: return home to water aerobics PT Goal Formulation: With patient Time For Goal Achievement: 01/14/19 Potential to Achieve Goals: Good    Frequency Min 3X/week   Barriers to discharge Decreased caregiver support      Co-evaluation               AM-PAC PT "6 Clicks" Mobility  Outcome Measure Help needed turning from your back to your side while in a flat bed without using bedrails?: None Help needed moving from lying on your back to sitting on the side of a flat bed without using bedrails?: None Help needed moving to and from a bed to a chair (including a wheelchair)?: A Little Help needed standing up from a chair using your arms (e.g., wheelchair or bedside chair)?: A Little Help needed to walk in hospital room?: A Little Help needed climbing 3-5 steps with a railing? : A Little 6 Click Score: 20    End of Session Equipment Utilized During Treatment: Gait belt Activity Tolerance: Patient tolerated treatment well Patient left: in chair;with call bell/phone within reach;with chair alarm set Nurse Communication: Mobility status PT Visit Diagnosis: Other abnormalities of gait and mobility (R26.89);Muscle weakness (generalized) (M62.81);Unsteadiness on feet (R26.81)    Time: 7412-8786 PT Time Calculation (min) (ACUTE ONLY): 24 min   Charges:   PT Evaluation $PT Eval Moderate Complexity: 1 Mod PT Treatments $Therapeutic Activity: 8-22 mins        Jahmir Salo Pam Drown, PT Acute Rehabilitation Services Pager: [phone] Office: [phone]   Sandy Salaam  Kaeson Kleinert 01/07/2019, 12:36 PM

## 2019-01-07 NOTE — Evaluation (Signed)
Occupational Therapy Evaluation Patient Details Name: Randy Chen MRN: [id number] DOB: [birth year] Today's Date: 01/07/2019    History of Present Illness 85 yo admitted with chest pain with Afib 7/19. Pt also with episode of transient dysarthria with negative MRI with recent episode 1 month ago. PMHx: L2-3 osteomyelitis, CAD, CABG, AVR, HTN, HLD, prostatectomy, PAD   Clinical Impression   Pt is at set up level with UB ADLs, min guard A with LB ADLs, grooming at sink, toileting and transfers using RW. Pt lives with his wife at [place] in [place] apartment and walks to dining room for meals. Pt declined [place] OT and says that wife will assist him as needed. All education completed and no further acute OT is indicated at this time    Follow Up Recommendations  No OT follow up;Supervision - Intermittent    Equipment Recommendations  None recommended by OT    Recommendations for Other Services       Precautions / Restrictions Precautions Precautions: Fall Restrictions Weight Bearing Restrictions: No      Mobility Bed Mobility Overal bed mobility: Needs Assistance Bed Mobility: Supine to Sit     Supine to sit: Supervision     General bed mobility comments: pt in recliner upon arrival  Transfers Overall transfer level: Needs assistance   Transfers: Sit to/from Stand Sit to Stand: Min guard         General transfer comment: cues for hand placement    Balance Overall balance assessment: Needs assistance   Sitting balance-Leahy Scale: Good Sitting balance - Comments: EOB without assist   Standing balance support: Single extremity supported;Bilateral upper extremity supported;During functional activity Standing balance-Leahy Scale: Poor Standing balance comment: single and bil UE support in standing                           ADL either performed or assessed with clinical judgement   ADL Overall ADL's : Needs assistance/impaired Eating/Feeding:  Independent;Sitting   Grooming: Wash/dry hands;Wash/dry face;Standing;Min guard   Upper Body Bathing: Set up   Lower Body Bathing: Min guard   Upper Body Dressing : Set up   Lower Body Dressing: Min guard   Toilet Transfer: Min guard;Ambulation;Grab bars;Cueing for safety;RW   Toileting- Clothing Manipulation and Hygiene: Min guard;Sit to/from stand   Tub/ Shower Transfer: Min guard;Cueing for safety;Ambulation;Grab bars;Rolling walker           Vision Baseline Vision/History: Wears glasses Wears Glasses: Reading only Patient Visual Report: No change from baseline       Perception     Praxis      Pertinent Vitals/Pain Pain Assessment: No/denies pain     Hand Dominance Right   Extremity/Trunk Assessment Upper Extremity Assessment Upper Extremity Assessment: Generalized weakness   Lower Extremity Assessment Lower Extremity Assessment: Defer to PT evaluation   Cervical / Trunk Assessment Cervical / Trunk Assessment: Kyphotic   Communication Communication Communication: No difficulties   Cognition Arousal/Alertness: Awake/alert Behavior During Therapy: WFL for tasks assessed/performed Overall Cognitive Status: Impaired/Different from baseline Area of Impairment: Safety/judgement                         Safety/Judgement: Decreased awareness of deficits         General Comments       Exercises     Shoulder Instructions      Home Living Family/patient expects to be discharged to:: Private residence Living Arrangements: Spouse/significant  other Available Help at Discharge: Available 24 hours/day Type of Home: Independent living facility Home Access: Level entry     Home Layout: One level     Bathroom Shower/Tub: Occupational psychologist: Handicapped height     Home Equipment: Environmental consultant - 2 wheels;Shower seat - built in   Additional Comments: walk in shower with handicapped height      Prior Functioning/Environment Level  of Independence: Independent with assistive device(s)        Comments: Uses RW for ambulation outside of home at times. Staining  glass is his hobby        OT Problem List: Decreased strength;Decreased activity tolerance      OT Treatment/Interventions:      OT Goals(Current goals can be found in the care plan section) Acute Rehab OT Goals Patient Stated Goal: return home to water aerobics OT Goal Formulation: With patient  OT Frequency:     Barriers to D/C: Other (comment)  no barriers       Co-evaluation              AM-PAC OT "6 Clicks" Daily Activity     Outcome Measure Help from another person eating meals?: None Help from another person taking care of personal grooming?: A Little Help from another person toileting, which includes using toliet, bedpan, or urinal?: A Little Help from another person bathing (including washing, rinsing, drying)?: A Little   Help from another person to put on and taking off regular lower body clothing?: A Little 6 Click Score: 16   End of Session Equipment Utilized During Treatment: Gait belt;Rolling walker  Activity Tolerance: Patient tolerated treatment well Patient left: in chair;with call bell/phone within reach;with chair alarm set  OT Visit Diagnosis: Unsteadiness on feet (R26.81);Muscle weakness (generalized) (M62.81)                Time: 6578-4696 OT Time Calculation (min): 24 min Charges:  OT Evaluation $OT Eval Moderate Complexity: 1 Mod    Britt Bottom 01/07/2019, 12:48 PM

## 2019-01-07 NOTE — Progress Notes (Signed)
Progress Note  Patient Name: Randy Chen Date of Encounter: 01/07/2019  Primary Cardiologist: Kirk Ruths, MD   Subjective   No acute events overnight. He is feeling well, no further chest pain. We reviewed the results of his lexiscan together. He feels like he is at his baseline. Asked about restrictions, none from heart perspective except that he should gradually return to normal activity at home after several days in the hospital. All questions answered.  Inpatient Medications    Scheduled Meds: . amLODipine  5 mg Oral Daily  . amoxicillin  500 mg Oral TID  . apixaban  5 mg Oral BID  . feeding supplement (ENSURE ENLIVE)  237 mL Oral BID BM  . lisinopril  20 mg Oral BID  . metoprolol succinate  100 mg Oral Daily  . nitroGLYCERIN  0.5 inch Topical Q8H  . timolol  1 drop Right Eye BID   Continuous Infusions:  PRN Meds: acetaminophen **OR** acetaminophen, HYDROcodone-acetaminophen, loperamide, morphine injection, ondansetron **OR** ondansetron (ZOFRAN) IV, zolpidem   Vital Signs    Vitals:   01/07/19 0500 01/07/19 0510 01/07/19 0612 01/07/19 0845  BP:  137/70  (!) 118/53  Pulse:  93 96   Resp:  15    Temp:  99.3 F (37.4 C)    TempSrc:  Oral    SpO2:  94%    Weight: 72.2 kg     Height:        Intake/Output Summary (Last 24 hours) at 01/07/2019 0921 Last data filed at 01/07/2019 9562 Gross per 24 hour  Intake 600 ml  Output 175 ml  Net 425 ml   Last 3 Weights 01/07/2019 01/06/2019 01/05/2019  Weight (lbs) 159 lb 1.6 oz 159 lb 6.4 oz 160 lb 8 oz  Weight (kg) 72.167 kg 72.303 kg 72.802 kg      Telemetry    SR with PACs and PVCs - Personally Reviewed  ECG    No new ECG tracing available today. - Personally Reviewed  Physical Exam   Physical Exam:  GEN: Well nourished, well developed in no acute distress HEENT: Normal NECK: No JVD at 45 degrees LYMPHATICS: No lymphadenopathy CARDIAC: mostly regular rhythm with frequent premature beats, normal S1  and S2, no rubs, gallops. 2/5 SEM with preserved S2. Radial and DP pulses 2+ bilaterally. RESPIRATORY:  Clear to auscultation without rales, wheezing or rhonchi  ABDOMEN: Soft, non-tender, non-distended MUSCULOSKELETAL:  No edema; No deformity  SKIN: Warm and dry NEUROLOGIC:  Alert and oriented x 3 PSYCHIATRIC:  Normal affect   Labs    High Sensitivity Troponin:   Recent Labs  Lab 01/04/19 1017 01/04/19 1210 01/05/19 0612  TROPONINIHS 29* 23* 45*      Cardiac EnzymesNo results for input(s): TROPONINI in the last 168 hours. No results for input(s): TROPIPOC in the last 168 hours.   Chemistry Recent Labs  Lab 01/04/19 1017 01/07/19 0455  NA 138 133*  K 4.3 3.5  CL 102 99  CO2 23 24  GLUCOSE 138* 112*  BUN 23 33*  CREATININE 1.30* 1.50*  CALCIUM 9.5 8.7*  PROT  --  5.8*  ALBUMIN  --  2.6*  AST  --  23  ALT  --  18  ALKPHOS  --  91  BILITOT  --  0.8  GFRNONAA 49* 41*  GFRAA 56* 47*  ANIONGAP 13 10     Hematology Recent Labs  Lab 01/04/19 1017 01/07/19 0455  WBC 9.5 13.2*  RBC 3.83* 3.27*  HGB 10.9* 9.2*  HCT 34.4* 28.8*  MCV 89.8 88.1  MCH 28.5 28.1  MCHC 31.7 31.9  RDW 14.8 15.1  PLT 238 188    BNPNo results for input(s): BNP, PROBNP in the last 168 hours.   DDimer No results for input(s): DDIMER in the last 168 hours.   Radiology    Ct Angio Head W Or Wo Contrast  Result Date: 01/05/2019 CLINICAL DATA:  Stroke presentation with aphasia. EXAM: CT ANGIOGRAPHY HEAD AND NECK CT PERFUSION BRAIN TECHNIQUE: Multidetector CT imaging of the head and neck was performed using the standard protocol during bolus administration of intravenous contrast. Multiplanar CT image reconstructions and MIPs were obtained to evaluate the vascular anatomy. Carotid stenosis measurements (when applicable) are obtained utilizing NASCET criteria, using the distal internal carotid diameter as the denominator. Multiphase CT imaging of the brain was performed following IV bolus  contrast injection. Subsequent parametric perfusion maps were calculated using RAPID software. CONTRAST:  70mL OMNIPAQUE IOHEXOL 350 MG/ML SOLN COMPARISON:  Head CT earlier same day. Previous imaging studies May 2020 FINDINGS: CTA NECK FINDINGS Aortic arch: Aortic atherosclerosis. No aneurysm or dissection. Branching pattern is normal without origin stenosis. Right carotid system: Common carotid artery widely patent to the bifurcation. Calcified plaque at the carotid bifurcation and ICA bulb but no stenosis. Cervical ICA widely patent. Left carotid system: Common carotid artery widely patent to the bifurcation. Calcified plaque at the carotid bifurcation and ICA bulb. Minimal diameter in the ICA bulb is 3.5 mm. Compared to a more distal cervical ICA diameter of 5 mm, this indicates a 30% stenosis. Vertebral arteries: Dominant right vertebral artery origin is widely patent. Non dominant left vertebral artery origin shows 50% stenosis at its origin calcified plaque in the proximal extent with additional stenoses. The right vertebral artery is widely patent through the cervical region to the foramen magnum. The small left vertebral artery is patent to the level of C1 but not beyond that. Skeleton: Chronic spinal ankylosis.  No acute finding. Other neck: No mass or lymphadenopathy. Upper chest: Negative Review of the MIP images confirms the above findings CTA HEAD FINDINGS Anterior circulation: Both internal carotid arteries are patent through the skull base. There is atherosclerotic calcification in the carotid siphon regions but no stenosis greater than 30% suspected. The anterior and middle cerebral vessels are patent without proximal stenosis, aneurysm or vascular malformation. No large or medium vessel occlusion identified. Posterior circulation: Right vertebral artery is widely patent to the basilar. Left vertebral artery is occluded at the C1 level with no antegrade flow through the foramen magnum or in the V4  segment. I think there is retrograde flow back to left PICA in the severely diseased distal left vertebral artery. Basilar artery shows mild atherosclerotic irregularity but no flow limiting stenosis. Superior cerebellar and posterior cerebral arteries are patent. Large posterior communicating artery on the left. Venous sinuses: Patent and normal. Anatomic variants: None significant. Delayed phase: Not performed. Review of the MIP images confirms the above findings CT Brain Perfusion Findings: ASPECTS: 10 CBF (<30%) Volume: 55mL Perfusion (Tmax>6.0s) volume: 40mL Mismatch Volume: 40mL Infarction Location:None IMPRESSION: Negative CT perfusion study. Atherosclerotic change at both carotid bifurcations. No stenosis on the right. 30% stenosis on the left. Extensive aortic atherosclerosis. Non dominant left vertebral artery shows proximal stenoses and occlusion at the C1 level. Retrograde flow in the distal left vertebral artery back to PICA. No sign of intracranial branch vessel occlusion. These results were communicated to Dr. Rory Percy at 12:27 pmon  7/19/2020by text page via the [facility] messaging system. Electronically Signed   By: Nelson Chimes M.D.   On: 01/05/2019 12:29   Ct Angio Neck W Or Wo Contrast  Result Date: 01/05/2019 CLINICAL DATA:  Stroke presentation with aphasia. EXAM: CT ANGIOGRAPHY HEAD AND NECK CT PERFUSION BRAIN TECHNIQUE: Multidetector CT imaging of the head and neck was performed using the standard protocol during bolus administration of intravenous contrast. Multiplanar CT image reconstructions and MIPs were obtained to evaluate the vascular anatomy. Carotid stenosis measurements (when applicable) are obtained utilizing NASCET criteria, using the distal internal carotid diameter as the denominator. Multiphase CT imaging of the brain was performed following IV bolus contrast injection. Subsequent parametric perfusion maps were calculated using RAPID software. CONTRAST:  82mL OMNIPAQUE IOHEXOL 350  MG/ML SOLN COMPARISON:  Head CT earlier same day. Previous imaging studies May 2020 FINDINGS: CTA NECK FINDINGS Aortic arch: Aortic atherosclerosis. No aneurysm or dissection. Branching pattern is normal without origin stenosis. Right carotid system: Common carotid artery widely patent to the bifurcation. Calcified plaque at the carotid bifurcation and ICA bulb but no stenosis. Cervical ICA widely patent. Left carotid system: Common carotid artery widely patent to the bifurcation. Calcified plaque at the carotid bifurcation and ICA bulb. Minimal diameter in the ICA bulb is 3.5 mm. Compared to a more distal cervical ICA diameter of 5 mm, this indicates a 30% stenosis. Vertebral arteries: Dominant right vertebral artery origin is widely patent. Non dominant left vertebral artery origin shows 50% stenosis at its origin calcified plaque in the proximal extent with additional stenoses. The right vertebral artery is widely patent through the cervical region to the foramen magnum. The small left vertebral artery is patent to the level of C1 but not beyond that. Skeleton: Chronic spinal ankylosis.  No acute finding. Other neck: No mass or lymphadenopathy. Upper chest: Negative Review of the MIP images confirms the above findings CTA HEAD FINDINGS Anterior circulation: Both internal carotid arteries are patent through the skull base. There is atherosclerotic calcification in the carotid siphon regions but no stenosis greater than 30% suspected. The anterior and middle cerebral vessels are patent without proximal stenosis, aneurysm or vascular malformation. No large or medium vessel occlusion identified. Posterior circulation: Right vertebral artery is widely patent to the basilar. Left vertebral artery is occluded at the C1 level with no antegrade flow through the foramen magnum or in the V4 segment. I think there is retrograde flow back to left PICA in the severely diseased distal left vertebral artery. Basilar artery shows  mild atherosclerotic irregularity but no flow limiting stenosis. Superior cerebellar and posterior cerebral arteries are patent. Large posterior communicating artery on the left. Venous sinuses: Patent and normal. Anatomic variants: None significant. Delayed phase: Not performed. Review of the MIP images confirms the above findings CT Brain Perfusion Findings: ASPECTS: 10 CBF (<30%) Volume: 60mL Perfusion (Tmax>6.0s) volume: 33mL Mismatch Volume: 64mL Infarction Location:None IMPRESSION: Negative CT perfusion study. Atherosclerotic change at both carotid bifurcations. No stenosis on the right. 30% stenosis on the left. Extensive aortic atherosclerosis. Non dominant left vertebral artery shows proximal stenoses and occlusion at the C1 level. Retrograde flow in the distal left vertebral artery back to PICA. No sign of intracranial branch vessel occlusion. These results were communicated to Dr. Rory Percy at 12:27 pmon 7/19/2020by text page via the [facility] messaging system. Electronically Signed   By: Nelson Chimes M.D.   On: 01/05/2019 12:29   Mr Jeri Cos FO Contrast  Result Date: 01/05/2019 CLINICAL DATA:  Transient expressive aphasia.  Endocarditis. EXAM: MRI HEAD WITHOUT AND WITH CONTRAST TECHNIQUE: Multiplanar, multiecho pulse sequences of the brain and surrounding structures were obtained without and with intravenous contrast. CONTRAST:  7 mL Gadavist COMPARISON:  Head CT, CTA, and CT perfusion 01/05/2019 and MRI 11/14/2018 FINDINGS: Brain: There is no evidence of acute infarct, mass, midline shift, or extra-axial fluid collection. A few scattered chronic cerebral microhemorrhages are again seen. Patchy and confluent T2 hyperintensities in the cerebral white matter bilaterally are unchanged from the prior MRI and nonspecific but compatible with moderate to severe chronic small vessel ischemic disease. A small chronic right cerebellar infarct is unchanged. Post-contrast images are mildly motion degraded without abnormal  intracranial enhancement identified. There is moderate cerebral atrophy. Vascular: Major intracranial vascular flow voids are preserved. Skull and upper cervical spine: Unremarkable bone marrow signal. Sinuses/Orbits: Bilateral cataract extraction. Paranasal sinuses and mastoid air cells are clear. Other: None. IMPRESSION: 1. No acute intracranial abnormality. 2. Moderate to severe chronic small vessel ischemic disease. Electronically Signed   By: Logan Bores M.D.   On: 01/05/2019 17:42   Nm Myocar Multi W/spect W/wall Motion / Ef  Result Date: 01/06/2019  Defect 1: There is a non- reversible medium defect of moderate severity present in the basal inferoseptal, basal inferior, basal inferolateral, mid anteroseptal, mid inferoseptal, mid inferior and mid inferolateral location.. This could represent scarring  Nuclear stress EF: 52%. The left ventricular ejection fraction is mildly decreased (45-54%).  This is a low risk study. Compared to the previous myoview study in December, 2015, there are no changes.    Ct Cerebral Perfusion W Contrast  Result Date: 01/05/2019 CLINICAL DATA:  Stroke presentation with aphasia. EXAM: CT ANGIOGRAPHY HEAD AND NECK CT PERFUSION BRAIN TECHNIQUE: Multidetector CT imaging of the head and neck was performed using the standard protocol during bolus administration of intravenous contrast. Multiplanar CT image reconstructions and MIPs were obtained to evaluate the vascular anatomy. Carotid stenosis measurements (when applicable) are obtained utilizing NASCET criteria, using the distal internal carotid diameter as the denominator. Multiphase CT imaging of the brain was performed following IV bolus contrast injection. Subsequent parametric perfusion maps were calculated using RAPID software. CONTRAST:  58mL OMNIPAQUE IOHEXOL 350 MG/ML SOLN COMPARISON:  Head CT earlier same day. Previous imaging studies May 2020 FINDINGS: CTA NECK FINDINGS Aortic arch: Aortic atherosclerosis. No  aneurysm or dissection. Branching pattern is normal without origin stenosis. Right carotid system: Common carotid artery widely patent to the bifurcation. Calcified plaque at the carotid bifurcation and ICA bulb but no stenosis. Cervical ICA widely patent. Left carotid system: Common carotid artery widely patent to the bifurcation. Calcified plaque at the carotid bifurcation and ICA bulb. Minimal diameter in the ICA bulb is 3.5 mm. Compared to a more distal cervical ICA diameter of 5 mm, this indicates a 30% stenosis. Vertebral arteries: Dominant right vertebral artery origin is widely patent. Non dominant left vertebral artery origin shows 50% stenosis at its origin calcified plaque in the proximal extent with additional stenoses. The right vertebral artery is widely patent through the cervical region to the foramen magnum. The small left vertebral artery is patent to the level of C1 but not beyond that. Skeleton: Chronic spinal ankylosis.  No acute finding. Other neck: No mass or lymphadenopathy. Upper chest: Negative Review of the MIP images confirms the above findings CTA HEAD FINDINGS Anterior circulation: Both internal carotid arteries are patent through the skull base. There is atherosclerotic calcification in the carotid siphon regions but no  stenosis greater than 30% suspected. The anterior and middle cerebral vessels are patent without proximal stenosis, aneurysm or vascular malformation. No large or medium vessel occlusion identified. Posterior circulation: Right vertebral artery is widely patent to the basilar. Left vertebral artery is occluded at the C1 level with no antegrade flow through the foramen magnum or in the V4 segment. I think there is retrograde flow back to left PICA in the severely diseased distal left vertebral artery. Basilar artery shows mild atherosclerotic irregularity but no flow limiting stenosis. Superior cerebellar and posterior cerebral arteries are patent. Large posterior  communicating artery on the left. Venous sinuses: Patent and normal. Anatomic variants: None significant. Delayed phase: Not performed. Review of the MIP images confirms the above findings CT Brain Perfusion Findings: ASPECTS: 10 CBF (<30%) Volume: 56mL Perfusion (Tmax>6.0s) volume: 55mL Mismatch Volume: 79mL Infarction Location:None IMPRESSION: Negative CT perfusion study. Atherosclerotic change at both carotid bifurcations. No stenosis on the right. 30% stenosis on the left. Extensive aortic atherosclerosis. Non dominant left vertebral artery shows proximal stenoses and occlusion at the C1 level. Retrograde flow in the distal left vertebral artery back to PICA. No sign of intracranial branch vessel occlusion. These results were communicated to Dr. Rory Percy at 12:27 pmon 7/19/2020by text page via the [facility] messaging system. Electronically Signed   By: Nelson Chimes M.D.   On: 01/05/2019 12:29   Ct Head Code Stroke Wo Contrast  Result Date: 01/05/2019 CLINICAL DATA:  Code stroke.  Aphasia EXAM: CT HEAD WITHOUT CONTRAST TECHNIQUE: Contiguous axial images were obtained from the base of the skull through the vertex without intravenous contrast. COMPARISON:  CT and MRI studies May of this year. FINDINGS: Brain: Old small vessel infarction in the right cerebellum. Cerebral hemispheres show extensive chronic small-vessel ischemic changes affecting the deep and subcortical white matter. No sign of acute infarction by CT. No mass, hemorrhage, hydrocephalus or extra-axial collection. Vascular: There is atherosclerotic calcification of the major vessels at the base of the brain. Skull: Negative Sinuses/Orbits: Clear/normal Other: None ASPECTS ([place] Stroke Program Early CT Score) - Ganglionic level infarction (caudate, lentiform nuclei, internal capsule, insula, M1-M3 cortex): 7 - Supraganglionic infarction (M4-M6 cortex): 3 Total score (0-10 with 10 being normal): 10 IMPRESSION: 1. No acute finding. Chronic small-vessel  ischemic changes of the hemispheric white matter. Old right cerebellar infarction. 2. ASPECTS is 10. 3. These results were communicated to Dr. Rory Percy at 11:56 amon 7/19/2020by text page via the [facility] messaging system. Electronically Signed   By: Nelson Chimes M.D.   On: 01/05/2019 11:57    Cardiac Studies   TEE 12/18/2018:  1. The left ventricle has normal systolic function, with an ejection fraction of 60-65%. The cavity size was normal. No evidence of left ventricular regional wall motion abnormalities.  2. The right ventricle has normal systolc function. The cavity was normal. There is no increase in right ventricular wall thickness.  3. Mitral valve regurgitation is mild to moderate by color flow Doppler.  4. A bioprosthesis valve is present in the aortic position. Procedure Date: 2006 Normal aortic valve prosthesis. Echo findings shows no evidence of dehiscence, regurgitation, perivalvular leak or abscess of the aortic prosthesis.  5. Aortic valve regurgitation was not assessed by color flow Doppler.  6. There is evidence of mild plaque in the descending aorta. _______________  Leane Call 01/06/2019:  Defect 1: There is a non- reversible medium defect of moderate severity present in the basal inferoseptal, basal inferior, basal inferolateral, mid anteroseptal, mid inferoseptal, mid inferior  and mid inferolateral location.. This could represent scarring  Nuclear stress EF: 52%. The left ventricular ejection fraction is mildly decreased (45-54%).  This is a low risk study. Compared to the previous myoview study in December, 2015, there are no changes.  Patient Profile   [name] is a 85 y.o. male with a history of CAD with remote CABG in 1987, aortic stenosis s/p bioprosthetic AVR in 2006, PAD previously seen by Dr. 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 Cardiology was consulted for further evaluation. Patient describes the pain as mid-chest and epigastric pain for the last 3 months with some shortness of breath that typically occurs at rest. Sometimes occurs after a meal and with laying down. Improves with TUMS at times but also improves with Nitroglycerin  Assessment & Plan    Chest Pain with History of Remote CABG: no episodes overnight - EKG shows chronic ST/T changes. - High-sensitivity troponin mildly elevated at 29 >> 23 >> 45.  - Recent TEE from 12/18/2018 showed LVEF of 60-65% with no regional wall motion abnormalities. - Lexiscan Myoview on 7/20 was low risk and showed a non-reversible medium defect that could represent scarring but no findings suggestive of ischemia. Compared to the previous Myoview study in 05/2014, there are no changes. - Continue Aspirin (given AVR history). - LDL is 63 and has been in the 60's for the >8 years. Given age, would not start for CAD indication at this time.  Paroxysmal Atrial Fibrillation - Patient was noted to go into atrial fibrillation the evening of 7/19. On my personal review, this does not look significantly different on telemetry or ECG than the rhythm he has had throughout his stay, which is sinus with frequent PACs. There is a lot of artifact at the noted time on the evening of 7/19. He is tolerating anticoagulation, and with his high Chadsvasc he should be on [facility] if he truly has afib. I recommend an outpatient monitor vs empiric anticoagulation on cardiology follow up. - Currently on Lopressor 25mg  every 8 hours was started. HR still running largely 90s-100. Will consolidate to 100 mg metoprolol succinate daily today. - CHA2DS-VASc = 6 (CAD/PAD, HTN, CVA x2, age x2). Patient was started on Eliquis 5mg  twice daily.  Aortic Stenosis s/p Bioprosthetic AVR in 2006 - Most recent Echo from 12/18/2018 shows normal  aortic valve prothesis with no evidence of dehiscence, regurgitation, perivalvular leak or abscess of the aortic prosthesis. - Continue Aspirin 81mg  given AVR. - On long term antibiotics in place of PRN prophylaxis given history of endocarditis for now - follows with [name].  History of Endocarditis - Patient on long term oral antibiotics per [name].  Possible TIA - Patient has had recurrent episodes of expressive aphasia this admission. Code Stroke was called on 7/19. - Head CT showed no acute findings.  - Negative CT perfusion  But atherosclerotic changes were noted at both carotid bifurcations. - MRI showed moderate to severe chronic small vessel ischemic disease but no acute intracranial abnormality. - EEG showed no seizure or seizure predisposition. - Neurology following.   Hypertension - Most recent BP 137/70. - Currently on home Amlodipine 5mg  daily, home Lisinopril 20mg  twice daily, and Lopressor 25mg  three times daily (on Toprol-XL 25mg  daily at home).  Hyperlipidemia - Most recent lipid panel from 11/15/2018:  Total Cholesterol 116, Triglycerides 127, HDL 28, LDL 63. - LDL goal <70 given CAD. At goal on no statins.  [facility] will sign off.   Medication Recommendations: Continue current medications. Changed metoprolol succinate dose this admission, would continue on higher 100 mg dose at discharge. Started on apixaban this admission.  Other recommendations (labs, testing, etc):  I would consider an outpatient monitor to determine if he has demonstrated afib vs. SR with very frequent PACs. I am not convinced the event he had 7/19 evening was any different than his baseline rhythm base on the current data. Tolerating anticoagulation, continue for now. Follow up as an outpatient:  We will arrange for outpatient follow up.  For questions or updates, please contact [place] Please consult [email or web address] for contact info under     Signed, Buford Dresser,  MD  01/07/2019, 9:21 AM

## 2019-01-07 NOTE — Discharge Summary (Signed)
Physician Discharge Summary  Randy Chen YTK:[id number] DOB: 1927/06/27 DOA: 01/04/2019  PCP: [name], MD  Admit date: 01/04/2019 Discharge date: 01/07/2019  Admitted From: Home Disposition: Home  Recommendations for Outpatient Follow-up:  1. Follow up with PCP and cardiology in 1-2 weeks 2. Please obtain CBC/BMP/Mag at follow up 3. Please follow up on the following pending results: None  Home Health: Patient declined HH Equipment/Devices: None  Discharge Condition: Stable CODE STATUS: Full code  Hospital Course: 85 year old male with history of CAD/CABG, PAD, bioprosthetic aortic valve, HTN, HLD and prostatectomy presenting with chest pain.  High-sensitivity troponin trended from 29-23-45.  EKG with T wave changes.  Lexiscan on 01/06/19 with some nonreversible CAD.  Hospital course complicated by an episode of some word finding problem.  Code stroke activated.  CVA work-up including CT head, CT stroke perfusion scan, CTA head/neck and MRI brain without acute finding.  He was also noted to be in A. fib with RVR.  Started on metoprolol and Eliquis with good control in his heart rate.   Cardiology to arrange outpatient follow-up.  Discharge Diagnoses:  Atypical chest pain in patient with CAD/CABG/PAD: Resolved.  TW changes on EKG.  Mild elevation of troponin. -Myoview on 01/06/19 with nonreversible medium defect of moderate severity considered to be low risk compared to his previous study in 2015.  See detailed results below. -Discharged on metoprolol XL 100 mg daily, Eliquis and aspirin. -Cardiology to arrange outpatient follow-up.  New paroxysmal atrial fibrillation: Rate controlled with metoprolol.  Chads 2 vas score 6.  Echo with EF of 60 to 65% with no source of embolus.  AV prosthesis normal. 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 -Neuro exam reassuring. -CT head, CT cerebral perfusion, CTA head/neck and MRI brain without acute finding -EEG without epileptiform discharge. -Appreciate neurology input  Hypertension: Normotensive -Continue amlodipine, metoprolol and lisinopril  Discharge Instructions  Discharge Instructions    Ambulatory referral to Neurology   Complete by: As directed    Follow up with stroke clinic NP (Jessica Vanschaick or Cecille Rubin, if both not available, consider Dr. Antony Contras, Dr. Bess Harvest, or Dr. Sarina Ill) at [facility] in about 4 weeks.  Unlikely TIA - more likely sz other neuro degenerative dz. Also with cognitive decline Please assign to most appropriate person   Call MD for:  difficulty breathing, headache or visual disturbances   Complete by: As directed    Call MD for:  extreme fatigue   Complete by: As directed    Call MD for:  persistant dizziness or light-headedness   Complete by: As directed    Call MD for:  persistant nausea and vomiting   Complete by: As directed    Call MD for:  severe uncontrolled pain   Complete by: As directed    Call MD for:  temperature >100.4   Complete by: As directed    Diet - low sodium heart healthy   Complete by: As directed    Discharge instructions   Complete by: As directed    It has been a pleasure taking care of you! You were admitted with chest pain and some confusion/word finding problems.  You had a stress test did not reveal any new heart attack other than some prior damage.  We also did a CT of your head and MRI of your  brain that did not reveal new finding.  You were also found to have irregular heartbeat/atrial fibrillation.  We have started you on blood thinner to prevent stroke.  Please avoid any over-the-counter pain medication other than Tylenol while on this blood thinner.   We are discharging you to follow-up with  neurology, cardiology and your primary care doctor. Please review your new medication list and the directions before you take your medications. Please call your primary care office as soon as possible to schedule hospital follow-up visit in 1 to 2 weeks.  Take care,   Increase activity slowly   Complete by: As directed      Allergies as of 01/07/2019   No Known Allergies     Medication List    STOP taking these medications   ampicillin  IVPB     TAKE these medications   amLODipine 5 MG tablet Commonly known as: NORVASC Take 5 mg by mouth daily.   amoxicillin 500 MG capsule Commonly known as: AMOXIL Take 1 capsule (500 mg total) by mouth 3 (three) times daily.   apixaban 5 MG Tabs tablet Commonly known as: ELIQUIS Take 1 tablet (5 mg total) by mouth 2 (two) times daily.   aspirin EC 81 MG tablet Take 81 mg by mouth daily.   CALCIUM 600 PO Take 600 mg by mouth daily.   folic acid 101 MCG tablet Commonly known as: FOLVITE Take 400 mcg by mouth 2 (two) times a week.   GLUCOSAMINE PO Take 1 tablet by mouth daily.   HYDROcodone-acetaminophen 5-325 MG tablet Commonly known as: NORCO/VICODIN Take 1-2 tablets by mouth every 4 (four) hours as needed for moderate pain.   lisinopril 20 MG tablet Commonly known as: ZESTRIL Take 1 tablet (20 mg total) by mouth daily. What changed:   medication strength  when to take this   loperamide 2 MG capsule Commonly known as: IMODIUM Take 1 capsule (2 mg total) by mouth 4 (four) times daily as needed for diarrhea or loose stools.   metoprolol succinate 100 MG 24 hr tablet Commonly known as: TOPROL-XL Take 1 tablet (100 mg total) by mouth daily. Take with or immediately following a meal. Start taking on: January 08, 2019 What changed:   medication strength  how much to take  additional instructions   THERAGRAN PO Take 1 tablet by mouth daily.   timolol 0.5 % ophthalmic solution Commonly known as: TIMOPTIC Place 1 drop  into the right eye 2 (two) times daily.   TYLENOL 500 MG tablet Generic drug: acetaminophen Take 1,000 mg by mouth every 6 (six) hours as needed (for pain).   vitamin C 500 MG tablet Commonly known as: ASCORBIC ACID Take 500 mg by mouth daily.   VITAMIN D-3 PO Take 1 capsule by mouth daily.      Follow-up Information    [name] Follow up in 4 week(s).   Specialty: Neurology Why: Office will call you with appointment date and time Contact information: [address] [place] [phone]       Erlene Quan, PA-C Follow up.   Specialties: Cardiology, Radiology Why: You have a hospital follow-up visit scheduled for 01/30/2019 at 9:45am with [name], one of Dr. Jacalyn Lefevre PAs. Please arrive 15 minutes early for check-in. Contact information: [address] [place] [address] [phone]           Consultations:  Cardiology  Neurology  Procedures/Studies:  Myoview: 7/20  Defect  1: There is a non- reversible medium defect of moderate severity present in the basal inferoseptal, basal inferior, basal inferolateral, mid anteroseptal, mid inferoseptal, mid inferior and mid inferolateral location.. This could represent scarring  Nuclear stress EF: 52%. The left ventricular ejection fraction is mildly decreased (45-54%).  This is a low risk study. Compared to the previous myoview study in December, 2015, there are no changes.   EEG: 7/20 Clinical Interpretation: This normal EEG is recorded in the waking and sleep state. There was no seizure or seizure predisposition recorded on this study. Please note that lack of epileptiform activity on EEG does not preclude the possibility of epilepsy.  Ct Angio Head W Or Wo Contrast  Result Date: 01/05/2019 CLINICAL DATA:  Stroke presentation with aphasia. EXAM: CT ANGIOGRAPHY HEAD AND NECK CT PERFUSION BRAIN TECHNIQUE: Multidetector CT imaging of the  head and neck was performed using the standard protocol during bolus administration of intravenous contrast. Multiplanar CT image reconstructions and MIPs were obtained to evaluate the vascular anatomy. Carotid stenosis measurements (when applicable) are obtained utilizing NASCET criteria, using the distal internal carotid diameter as the denominator. Multiphase CT imaging of the brain was performed following IV bolus contrast injection. Subsequent parametric perfusion maps were calculated using RAPID software. CONTRAST:  54mL OMNIPAQUE IOHEXOL 350 MG/ML SOLN COMPARISON:  Head CT earlier same day. Previous imaging studies May 2020 FINDINGS: CTA NECK FINDINGS Aortic arch: Aortic atherosclerosis. No aneurysm or dissection. Branching pattern is normal without origin stenosis. Right carotid system: Common carotid artery widely patent to the bifurcation. Calcified plaque at the carotid bifurcation and ICA bulb but no stenosis. Cervical ICA widely patent. Left carotid system: Common carotid artery widely patent to the bifurcation. Calcified plaque at the carotid bifurcation and ICA bulb. Minimal diameter in the ICA bulb is 3.5 mm. Compared to a more distal cervical ICA diameter of 5 mm, this indicates a 30% stenosis. Vertebral arteries: Dominant right vertebral artery origin is widely patent. Non dominant left vertebral artery origin shows 50% stenosis at its origin calcified plaque in the proximal extent with additional stenoses. The right vertebral artery is widely patent through the cervical region to the foramen magnum. The small left vertebral artery is patent to the level of C1 but not beyond that. Skeleton: Chronic spinal ankylosis.  No acute finding. Other neck: No mass or lymphadenopathy. Upper chest: Negative Review of the MIP images confirms the above findings CTA HEAD FINDINGS Anterior circulation: Both internal carotid arteries are patent through the skull base. There is atherosclerotic calcification in the  carotid siphon regions but no stenosis greater than 30% suspected. The anterior and middle cerebral vessels are patent without proximal stenosis, aneurysm or vascular malformation. No large or medium vessel occlusion identified. Posterior circulation: Right vertebral artery is widely patent to the basilar. Left vertebral artery is occluded at the C1 level with no antegrade flow through the foramen magnum or in the V4 segment. I think there is retrograde flow back to left PICA in the severely diseased distal left vertebral artery. Basilar artery shows mild atherosclerotic irregularity but no flow limiting stenosis. Superior cerebellar and posterior cerebral arteries are patent. Large posterior communicating artery on the left. Venous sinuses: Patent and normal. Anatomic variants: None significant. Delayed phase: Not performed. Review of the MIP images confirms the above findings CT Brain Perfusion Findings: ASPECTS: 10 CBF (<30%) Volume: 52mL Perfusion (Tmax>6.0s) volume: 68mL Mismatch Volume: 79mL Infarction Location:None IMPRESSION: Negative CT perfusion study. Atherosclerotic change at both carotid bifurcations. No stenosis  on the right. 30% stenosis on the left. Extensive aortic atherosclerosis. Non dominant left vertebral artery shows proximal stenoses and occlusion at the C1 level. Retrograde flow in the distal left vertebral artery back to PICA. No sign of intracranial branch vessel occlusion. These results were communicated to Dr. Rory Percy at 12:27 pmon 7/19/2020by text page via the [facility] messaging system. Electronically Signed   By: Nelson Chimes M.D.   On: 01/05/2019 12:29   Dg Chest 2 View  Result Date: 01/04/2019 CLINICAL DATA:  Chest pain and epigastric abdominal pain that began acutely last night. EXAM: CHEST - 2 VIEW COMPARISON:  11/14/2018 and earlier. FINDINGS: Sternotomy for CABG and aortic valve replacement. Cardiac silhouette normal in size, unchanged. Thoracic aorta tortuous and atherosclerotic,  unchanged. Hilar and mediastinal contours otherwise unremarkable. Lungs clear. Bronchovascular markings normal. Pulmonary vascularity normal. No visible pleural effusions. No pneumothorax. Degenerative changes and DISH involving the thoracic spine. Remote healed RIGHT rib fractures. IMPRESSION: No acute cardiopulmonary disease. Electronically Signed   By: Evangeline Dakin M.D.   On: 01/04/2019 11:03   Ct Angio Neck W Or Wo Contrast  Result Date: 01/05/2019 CLINICAL DATA:  Stroke presentation with aphasia. EXAM: CT ANGIOGRAPHY HEAD AND NECK CT PERFUSION BRAIN TECHNIQUE: Multidetector CT imaging of the head and neck was performed using the standard protocol during bolus administration of intravenous contrast. Multiplanar CT image reconstructions and MIPs were obtained to evaluate the vascular anatomy. Carotid stenosis measurements (when applicable) are obtained utilizing NASCET criteria, using the distal internal carotid diameter as the denominator. Multiphase CT imaging of the brain was performed following IV bolus contrast injection. Subsequent parametric perfusion maps were calculated using RAPID software. CONTRAST:  20mL OMNIPAQUE IOHEXOL 350 MG/ML SOLN COMPARISON:  Head CT earlier same day. Previous imaging studies May 2020 FINDINGS: CTA NECK FINDINGS Aortic arch: Aortic atherosclerosis. No aneurysm or dissection. Branching pattern is normal without origin stenosis. Right carotid system: Common carotid artery widely patent to the bifurcation. Calcified plaque at the carotid bifurcation and ICA bulb but no stenosis. Cervical ICA widely patent. Left carotid system: Common carotid artery widely patent to the bifurcation. Calcified plaque at the carotid bifurcation and ICA bulb. Minimal diameter in the ICA bulb is 3.5 mm. Compared to a more distal cervical ICA diameter of 5 mm, this indicates a 30% stenosis. Vertebral arteries: Dominant right vertebral artery origin is widely patent. Non dominant left vertebral  artery origin shows 50% stenosis at its origin calcified plaque in the proximal extent with additional stenoses. The right vertebral artery is widely patent through the cervical region to the foramen magnum. The small left vertebral artery is patent to the level of C1 but not beyond that. Skeleton: Chronic spinal ankylosis.  No acute finding. Other neck: No mass or lymphadenopathy. Upper chest: Negative Review of the MIP images confirms the above findings CTA HEAD FINDINGS Anterior circulation: Both internal carotid arteries are patent through the skull base. There is atherosclerotic calcification in the carotid siphon regions but no stenosis greater than 30% suspected. The anterior and middle cerebral vessels are patent without proximal stenosis, aneurysm or vascular malformation. No large or medium vessel occlusion identified. Posterior circulation: Right vertebral artery is widely patent to the basilar. Left vertebral artery is occluded at the C1 level with no antegrade flow through the foramen magnum or in the V4 segment. I think there is retrograde flow back to left PICA in the severely diseased distal left vertebral artery. Basilar artery shows mild atherosclerotic irregularity but no flow limiting  stenosis. Superior cerebellar and posterior cerebral arteries are patent. Large posterior communicating artery on the left. Venous sinuses: Patent and normal. Anatomic variants: None significant. Delayed phase: Not performed. Review of the MIP images confirms the above findings CT Brain Perfusion Findings: ASPECTS: 10 CBF (<30%) Volume: 61mL Perfusion (Tmax>6.0s) volume: 18mL Mismatch Volume: 42mL Infarction Location:None IMPRESSION: Negative CT perfusion study. Atherosclerotic change at both carotid bifurcations. No stenosis on the right. 30% stenosis on the left. Extensive aortic atherosclerosis. Non dominant left vertebral artery shows proximal stenoses and occlusion at the C1 level. Retrograde flow in the distal  left vertebral artery back to PICA. No sign of intracranial branch vessel occlusion. These results were communicated to Dr. Rory Percy at 12:27 pmon 7/19/2020by text page via the [facility] messaging system. Electronically Signed   By: Nelson Chimes M.D.   On: 01/05/2019 12:29   Mr Jeri Cos IW Contrast  Result Date: 01/05/2019 CLINICAL DATA:  Transient expressive aphasia.  Endocarditis. EXAM: MRI HEAD WITHOUT AND WITH CONTRAST TECHNIQUE: Multiplanar, multiecho pulse sequences of the brain and surrounding structures were obtained without and with intravenous contrast. CONTRAST:  7 mL Gadavist COMPARISON:  Head CT, CTA, and CT perfusion 01/05/2019 and MRI 11/14/2018 FINDINGS: Brain: There is no evidence of acute infarct, mass, midline shift, or extra-axial fluid collection. A few scattered chronic cerebral microhemorrhages are again seen. Patchy and confluent T2 hyperintensities in the cerebral white matter bilaterally are unchanged from the prior MRI and nonspecific but compatible with moderate to severe chronic small vessel ischemic disease. A small chronic right cerebellar infarct is unchanged. Post-contrast images are mildly motion degraded without abnormal intracranial enhancement identified. There is moderate cerebral atrophy. Vascular: Major intracranial vascular flow voids are preserved. Skull and upper cervical spine: Unremarkable bone marrow signal. Sinuses/Orbits: Bilateral cataract extraction. Paranasal sinuses and mastoid air cells are clear. Other: None. IMPRESSION: 1. No acute intracranial abnormality. 2. Moderate to severe chronic small vessel ischemic disease. Electronically Signed   By: Logan Bores M.D.   On: 01/05/2019 17:42   Nm Myocar Multi W/spect W/wall Motion / Ef  Result Date: 01/06/2019  Defect 1: There is a non- reversible medium defect of moderate severity present in the basal inferoseptal, basal inferior, basal inferolateral, mid anteroseptal, mid inferoseptal, mid inferior and mid  inferolateral location.. This could represent scarring  Nuclear stress EF: 52%. The left ventricular ejection fraction is mildly decreased (45-54%).  This is a low risk study. Compared to the previous myoview study in December, 2015, there are no changes.    Ct Cerebral Perfusion W Contrast  Result Date: 01/05/2019 CLINICAL DATA:  Stroke presentation with aphasia. EXAM: CT ANGIOGRAPHY HEAD AND NECK CT PERFUSION BRAIN TECHNIQUE: Multidetector CT imaging of the head and neck was performed using the standard protocol during bolus administration of intravenous contrast. Multiplanar CT image reconstructions and MIPs were obtained to evaluate the vascular anatomy. Carotid stenosis measurements (when applicable) are obtained utilizing NASCET criteria, using the distal internal carotid diameter as the denominator. Multiphase CT imaging of the brain was performed following IV bolus contrast injection. Subsequent parametric perfusion maps were calculated using RAPID software. CONTRAST:  12mL OMNIPAQUE IOHEXOL 350 MG/ML SOLN COMPARISON:  Head CT earlier same day. Previous imaging studies May 2020 FINDINGS: CTA NECK FINDINGS Aortic arch: Aortic atherosclerosis. No aneurysm or dissection. Branching pattern is normal without origin stenosis. Right carotid system: Common carotid artery widely patent to the bifurcation. Calcified plaque at the carotid bifurcation and ICA bulb but no stenosis. Cervical ICA widely patent.  Left carotid system: Common carotid artery widely patent to the bifurcation. Calcified plaque at the carotid bifurcation and ICA bulb. Minimal diameter in the ICA bulb is 3.5 mm. Compared to a more distal cervical ICA diameter of 5 mm, this indicates a 30% stenosis. Vertebral arteries: Dominant right vertebral artery origin is widely patent. Non dominant left vertebral artery origin shows 50% stenosis at its origin calcified plaque in the proximal extent with additional stenoses. The right vertebral artery is  widely patent through the cervical region to the foramen magnum. The small left vertebral artery is patent to the level of C1 but not beyond that. Skeleton: Chronic spinal ankylosis.  No acute finding. Other neck: No mass or lymphadenopathy. Upper chest: Negative Review of the MIP images confirms the above findings CTA HEAD FINDINGS Anterior circulation: Both internal carotid arteries are patent through the skull base. There is atherosclerotic calcification in the carotid siphon regions but no stenosis greater than 30% suspected. The anterior and middle cerebral vessels are patent without proximal stenosis, aneurysm or vascular malformation. No large or medium vessel occlusion identified. Posterior circulation: Right vertebral artery is widely patent to the basilar. Left vertebral artery is occluded at the C1 level with no antegrade flow through the foramen magnum or in the V4 segment. I think there is retrograde flow back to left PICA in the severely diseased distal left vertebral artery. Basilar artery shows mild atherosclerotic irregularity but no flow limiting stenosis. Superior cerebellar and posterior cerebral arteries are patent. Large posterior communicating artery on the left. Venous sinuses: Patent and normal. Anatomic variants: None significant. Delayed phase: Not performed. Review of the MIP images confirms the above findings CT Brain Perfusion Findings: ASPECTS: 10 CBF (<30%) Volume: 60mL Perfusion (Tmax>6.0s) volume: 61mL Mismatch Volume: 3mL Infarction Location:None IMPRESSION: Negative CT perfusion study. Atherosclerotic change at both carotid bifurcations. No stenosis on the right. 30% stenosis on the left. Extensive aortic atherosclerosis. Non dominant left vertebral artery shows proximal stenoses and occlusion at the C1 level. Retrograde flow in the distal left vertebral artery back to PICA. No sign of intracranial branch vessel occlusion. These results were communicated to Dr. Rory Percy at 12:27 pmon  7/19/2020by text page via the [facility] messaging system. Electronically Signed   By: Nelson Chimes M.D.   On: 01/05/2019 12:29   Ct Head Code Stroke Wo Contrast  Result Date: 01/05/2019 CLINICAL DATA:  Code stroke.  Aphasia EXAM: CT HEAD WITHOUT CONTRAST TECHNIQUE: Contiguous axial images were obtained from the base of the skull through the vertex without intravenous contrast. COMPARISON:  CT and MRI studies May of this year. FINDINGS: Brain: Old small vessel infarction in the right cerebellum. Cerebral hemispheres show extensive chronic small-vessel ischemic changes affecting the deep and subcortical white matter. No sign of acute infarction by CT. No mass, hemorrhage, hydrocephalus or extra-axial collection. Vascular: There is atherosclerotic calcification of the major vessels at the base of the brain. Skull: Negative Sinuses/Orbits: Clear/normal Other: None ASPECTS (Park Stroke Program Early CT Score) - Ganglionic level infarction (caudate, lentiform nuclei, internal capsule, insula, M1-M3 cortex): 7 - Supraganglionic infarction (M4-M6 cortex): 3 Total score (0-10 with 10 being normal): 10 IMPRESSION: 1. No acute finding. Chronic small-vessel ischemic changes of the hemispheric white matter. Old right cerebellar infarction. 2. ASPECTS is 10. 3. These results were communicated to Dr. Rory Percy at 11:56 amon 7/19/2020by text page via the [facility] messaging system. Electronically Signed   By: Nelson Chimes M.D.   On: 01/05/2019 11:57  Subjective: No major events overnight of this morning.  Denies chest pain, dyspnea, palpitation, dizziness, focal numbness, tingling or weakness.  No GI or GU symptoms.  Feels ready to go home.   Discharge Exam: Vitals:   01/07/19 0845 01/07/19 1016  BP: (!) 118/53 (!) 111/56  Pulse:    Resp:  19  Temp:    SpO2:      GENERAL: No acute distress.  Appears well.  HEENT: MMM.  Vision and hearing grossly intact.  NECK: Supple.  No JVD.  LUNGS:  No IWOB. Good air  movement bilaterally. HEART:  RRR. Heart sounds normal.  ABD: Bowel sounds present. Soft. Non tender.  MSK/EXT:  Moves all extremities. No apparent deformity. No edema bilaterally. SKIN: no apparent skin lesion or wound NEURO: Awake, alert and oriented x4.  Speech clear.  No gross deficit.  PSYCH: Calm. Normal affect.     The results of significant diagnostics from this hospitalization (including imaging, microbiology, ancillary and laboratory) are listed below for reference.     Microbiology: Recent Results (from the past 240 hour(s))  SARS Coronavirus 2 (Performed in [place] hospital lab)     Status: None   Collection Time: 01/04/19  1:43 PM   Specimen: Nasopharyngeal Swab  Result Value Ref Range Status   SARS Coronavirus 2 NEGATIVE NEGATIVE Final    Comment: (NOTE) If result is NEGATIVE SARS-CoV-2 target nucleic acids are NOT DETECTED. The SARS-CoV-2 RNA is generally detectable in upper and lower  respiratory specimens during the acute phase of infection. The lowest  concentration of SARS-CoV-2 viral copies this assay can detect is 250  copies / mL. A negative result does not preclude SARS-CoV-2 infection  and should not be used as the sole basis for treatment or other  patient management decisions.  A negative result may occur with  improper specimen collection / handling, submission of specimen other  than nasopharyngeal swab, presence of viral mutation(s) within the  areas targeted by this assay, and inadequate number of viral copies  (<250 copies / mL). A negative result must be combined with clinical  observations, patient history, and epidemiological information. If result is POSITIVE SARS-CoV-2 target nucleic acids are DETECTED. The SARS-CoV-2 RNA is generally detectable in upper and lower  respiratory specimens dur ing the acute phase of infection.  Positive  results are indicative of active infection with SARS-CoV-2.  Clinical  correlation with patient history  and other diagnostic information is  necessary to determine patient infection status.  Positive results do  not rule out bacterial infection or co-infection with other viruses. If result is PRESUMPTIVE POSTIVE SARS-CoV-2 nucleic acids MAY BE PRESENT.   A presumptive positive result was obtained on the submitted specimen  and confirmed on repeat testing.  While 2019 novel coronavirus  (SARS-CoV-2) nucleic acids may be present in the submitted sample  additional confirmatory testing may be necessary for epidemiological  and / or clinical management purposes  to differentiate between  SARS-CoV-2 and other Sarbecovirus currently known to infect humans.  If clinically indicated additional testing with an alternate test  methodology [phone]) is advised. The SARS-CoV-2 RNA is generally  detectable in upper and lower respiratory sp ecimens during the acute  phase of infection. The expected result is Negative. Fact Sheet for Patients:  StrictlyIdeas.no Fact Sheet for Healthcare Providers: BankingDealers.co.za This test is not yet approved or cleared by the [place] FDA and has been authorized for detection and/or diagnosis of SARS-CoV-2 by FDA under an Emergency Use  Authorization (EUA).  This EUA will remain in effect (meaning this test can be used) for the duration of the COVID-19 declaration under Section 564(b)(1) of the Act, 21 U.S.C. section 360bbb-3(b)(1), unless the authorization is terminated or revoked sooner. Performed at [facility], [address] [place], [place] [address]      Labs: BNP (last 3 results) No results for input(s): BNP in the last 8760 hours. Basic Metabolic Panel: Recent Labs  Lab 01/04/19 1017 01/07/19 0455  NA 138 133*  K 4.3 3.5  CL 102 99  CO2 23 24  GLUCOSE 138* 112*  BUN 23 33*  CREATININE 1.30* 1.50*  CALCIUM 9.5 8.7*  MG  --  2.0   Liver Function Tests: Recent Labs  Lab  01/07/19 0455  AST 23  ALT 18  ALKPHOS 91  BILITOT 0.8  PROT 5.8*  ALBUMIN 2.6*   Recent Labs  Lab 01/04/19 1017  LIPASE 28   No results for input(s): AMMONIA in the last 168 hours. CBC: Recent Labs  Lab 01/04/19 1017 01/07/19 0455  WBC 9.5 13.2*  HGB 10.9* 9.2*  HCT 34.4* 28.8*  MCV 89.8 88.1  PLT 238 188   Cardiac Enzymes: No results for input(s): CKTOTAL, CKMB, CKMBINDEX, TROPONINI in the last 168 hours. BNP: Invalid input(s): POCBNP CBG: No results for input(s): GLUCAP in the last 168 hours. D-Dimer No results for input(s): DDIMER in the last 72 hours. Hgb A1c No results for input(s): HGBA1C in the last 72 hours. Lipid Profile No results for input(s): CHOL, HDL, LDLCALC, TRIG, CHOLHDL, LDLDIRECT in the last 72 hours. Thyroid function studies No results for input(s): TSH, T4TOTAL, T3FREE, THYROIDAB in the last 72 hours.  Invalid input(s): FREET3 Anemia work up No results for input(s): VITAMINB12, FOLATE, FERRITIN, TIBC, IRON, RETICCTPCT in the last 72 hours. Urinalysis    Component Value Date/Time   COLORURINE YELLOW 01/05/2019 1340   APPEARANCEUR CLEAR 01/05/2019 1340   LABSPEC 1.044 (H) 01/05/2019 1340   PHURINE 6.0 01/05/2019 1340   GLUCOSEU NEGATIVE 01/05/2019 1340   HGBUR MODERATE (A) 01/05/2019 1340   BILIRUBINUR NEGATIVE 01/05/2019 1340   KETONESUR NEGATIVE 01/05/2019 1340   PROTEINUR 30 (A) 01/05/2019 1340   UROBILINOGEN 0.2 01/09/2015 1853   NITRITE NEGATIVE 01/05/2019 1340   LEUKOCYTESUR NEGATIVE 01/05/2019 1340   Sepsis Labs Invalid input(s): PROCALCITONIN,  WBC,  LACTICIDVEN   Time coordinating discharge: 35 minutes  SIGNED:  Mercy Riding, MD  [facility] 01/07/2019, 10:49 AM  If 7PM-7AM, please contact night-coverage [email or web address] Password [email or web address]

## 2019-01-08 ENCOUNTER — Telehealth: Payer: Self-pay | Admitting: Cardiology

## 2019-01-08 NOTE — Telephone Encounter (Signed)
Spoke with pt wife, confirmed metoprolol dose is to be 100 mg daily.

## 2019-01-08 NOTE — Telephone Encounter (Signed)
Patient's wife called stating patient was recently discharged from hospital, she wants to verify patient's medication change, metoprolol succinate (TOPROL-XL) from 25 mg to 100mg .

## 2019-01-23 ENCOUNTER — Telehealth: Payer: Self-pay | Admitting: Cardiology

## 2019-01-23 NOTE — Telephone Encounter (Signed)
  Pt c/o medication issue:  1. Name of Medication: metoprolol succinate (TOPROL-XL) 100 MG 24 hr tablet  2. How are you currently taking this medication (dosage and times per day)? Take 1 tablet (100 mg total) by mouth daily. Take with or immediately following a meal.  3. Are you having a reaction (difficulty breathing--STAT)? No  4. What is your medication issue? Patient was taking a different dosage before his hospitalization and he wants to know if he is supposed to take the 100 mg now or go back to what he was taking before his hospital visit on 01/04/19

## 2019-01-23 NOTE — Telephone Encounter (Signed)
Called patient, advised that per message from [place], [place] on 07/22 patient was confirmed to continue to take the 100 mg tablets since being discharged.  Patient verbalized understanding.

## 2019-01-30 ENCOUNTER — Ambulatory Visit: Payer: Medicare Other | Admitting: Cardiology

## 2019-02-04 ENCOUNTER — Telehealth: Payer: Self-pay | Admitting: Radiology

## 2019-02-04 ENCOUNTER — Encounter: Payer: Self-pay | Admitting: Physician Assistant

## 2019-02-04 ENCOUNTER — Ambulatory Visit (INDEPENDENT_AMBULATORY_CARE_PROVIDER_SITE_OTHER): Payer: Medicare Other | Admitting: Physician Assistant

## 2019-02-04 ENCOUNTER — Other Ambulatory Visit: Payer: Self-pay

## 2019-02-04 VITALS — BP 157/71 | HR 65 | Temp 97.7°F | Ht 66.5 in | Wt 167.6 lb

## 2019-02-04 DIAGNOSIS — Z952 Presence of prosthetic heart valve: Secondary | ICD-10-CM | POA: Diagnosis not present

## 2019-02-04 DIAGNOSIS — G459 Transient cerebral ischemic attack, unspecified: Secondary | ICD-10-CM

## 2019-02-04 DIAGNOSIS — I38 Endocarditis, valve unspecified: Secondary | ICD-10-CM | POA: Diagnosis not present

## 2019-02-04 DIAGNOSIS — E785 Hyperlipidemia, unspecified: Secondary | ICD-10-CM

## 2019-02-04 DIAGNOSIS — I480 Paroxysmal atrial fibrillation: Secondary | ICD-10-CM

## 2019-02-04 DIAGNOSIS — I25810 Atherosclerosis of coronary artery bypass graft(s) without angina pectoris: Secondary | ICD-10-CM | POA: Diagnosis not present

## 2019-02-04 DIAGNOSIS — R911 Solitary pulmonary nodule: Secondary | ICD-10-CM

## 2019-02-04 DIAGNOSIS — I10 Essential (primary) hypertension: Secondary | ICD-10-CM

## 2019-02-04 MED ORDER — METOPROLOL SUCCINATE ER 100 MG PO TB24: 100.0000 mg | ORAL_TABLET | Freq: Every day | ORAL | 1 refills | Status: DC

## 2019-02-04 MED ORDER — APIXABAN 5 MG PO TABS: 5.0000 mg | ORAL_TABLET | Freq: Two times a day (BID) | ORAL | 5 refills | Status: DC

## 2019-02-04 NOTE — Patient Instructions (Addendum)
Medication Instructions:  Your physician recommends that you continue on your current medications as directed. Please refer to the Current Medication list given to you today.  If you need a refill on your cardiac medications before your next appointment, please call your pharmacy.   Lab work: NONE ordered at this time of appointment   If you have labs (blood work) drawn today and your tests are completely normal, you will receive your results only by: [name] MyChart Message (if you have MyChart) OR . A paper copy in the mail If you have any lab test that is abnormal or we need to change your treatment, we will call you to review the results.  Testing/Procedures: Your physician has requested that you have an echocardiogram. Echocardiography is a painless test that uses sound waves to create images of your heart. It provides your doctor with information about the size and shape of your heart and how well your heart's chambers and valves are working. This procedure takes approximately one hour. There are no restrictions for this procedure.  Your physician has recommended that you wear an event monitor for 30 days. Event monitors are medical devices that record the heart's electrical activity. Doctors most often [place] these monitors to diagnose arrhythmias. Arrhythmias are problems with the speed or rhythm of the heartbeat. The monitor is a small, portable device. You can wear one while you do your normal daily activities. This is usually used to diagnose what is causing palpitations/syncope (passing out).  Follow-Up: At [facility], you and your health needs are our priority.  As part of our continuing mission to provide you with exceptional heart care, we have created designated Provider Care Teams.  These Care Teams include your primary Cardiologist (physician) and Advanced Practice Providers (APPs -  Physician Assistants and Nurse Practitioners) who all work together to provide you with the care you  need, when you need it. . Your physician recommends that you keep your scheduled appointment in September with Denice Bors. Stanford Breed, MD   Any Other Special Instructions Will Be Listed Below (If Applicable).

## 2019-02-04 NOTE — Progress Notes (Signed)
Cardiology Office Note    Date:  02/05/2019   ID:  Randy Chen, DOB 05/15/1928, MRN [id number]  PCP:  [name], MD  Cardiologist: Dr. Stanford Breed  Chief Complaint  Patient presents with   Hospitalization Follow-up    seen for Dr. Stanford Breed.    History of Present Illness:  Randy Chen is a 85 y.o. male with past medical history of CAD s/p CABG x5 in 1987, aortic stenosis s/p bioprosthetic AVR in 2006, history of endocarditis, hypertension, hyperlipidemia, history of prostatectomy, PAD, and TIA.  Nuclear study in December 2015 showed EF 70%, no ischemia.  He had enterococcal bacteremia in April 2020 and was treated with antibiotic.  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  He also had discitis in May 2020.  At that time, CT image showed right lower lobe lung nodule and follow-up imaging recommended in 3 to 6 months.  Echocardiogram obtained in April 2020 showed normal LVEF, moderate LAE, bioprosthetic aortic valve with no obvious vegetation and normal mean gradient.  Carotid Doppler in May 2020 showed mild disease bilaterally.  CT angiogram in May 2020 showed occluded left vertebral artery and no significant obstruction of the carotids.  Patient also had a TIA in May 2020.  Patient was last seen by Dr. Stanford Breed on 12/10/2018, he was still on IV antibiotic at the time managed by infectious disease.  Patient underwent TEE on 12/18/2018 which demonstrated EF 55%, mild to moderate MR, normal bioprosthetic aortic valve function, mild TR, no LAA thrombus or endocarditis was seen.  More recently, patient was admitted in July with chest pain which has been ongoing for 22-month.  During the hospitalization, patient went into new atrial fibrillation with RVR.  Due to elevated CHA2DS2-Vasc score of 6 (CAD, HTN, TIA, age), he was started on Eliquis 5 mg twice daily.  She was placed on rate control therapy using Toprol-XL.  Myoview obtained on 01/06/2019 showed EF 52%, nonreversible  medium defect of moderate severity present in the basal inferoseptal, basal inferior, basal inferolateral, mid anteroseptal, mid inferoseptal, mid inferior and mid inferolateral location consistent with scarring, overall low risk study and unchanged when compared to the previous Myoview in 2015.  Prior to discharge, Dr. Harrell Gave recommended consider outpatient monitor to determine if he has A. fib versus sinus rhythm with very frequent PACs.  Dr. Harrell Gave was not convinced that the possible afib event he had on 7/19 was any different than his baseline rhythm.  He has been instructed to continue outpatient anticoagulation for preventative measure.  Patient was reevaluated by neurology during this hospitalization's he had recurrent episode of expressive aphasia on 7/19.  Code stroke was triggered.  Head CT was negative.  MRI demonstrated moderate to severe chronic small vessel ischemic changes but no acute issue.  EEG obtained during this admission showed no seizure.  Patient presents today for cardiology office visit.  EKG showed normal sinus rhythm with PACs.  T wave inversion in the inferior lead.  He denies any further chest discomfort or shortness of breath.  His initial blood pressure was elevated in the 150s, subsequent manual recheck by myself was 140/68.  He is doing well on the current medication.  We have send in refill for his metoprolol and Eliquis.  I ordered a 30-day event monitor to detect for presence of atrial fibrillation.  He does not have any lower extremity edema, functioning or PND.  He has not had any ipsilateral weakness since recent discharge.  He can  follow-up with Dr. Stanford Breed next month.   Past Medical History:  Diagnosis Date   Aortic stenosis    a. 2006 s/p bioprosthetic AVR; b. 12/2018 TEE: EF 60-65%, Nl AoV fxn w/o dehiscence, regurgitation, perivalvular leak, or abscess.   Bacteremia due to Enterococcus 09/2018   a. ? discitis vs SBE; b. 12/2018 TEE: No evidence of  vegetation, nl fxn'ing bioprosthetic AoV.   Cancer [facility])    Carotid arterial disease ([place])    a. 10/2018 Carotid U/S: 1-39% bilat ICA dzs.   Coronary artery disease    a. 1987 s/p CABG x 5; 05/2014 MV: EF 70%, no significant ischemia.   Discitis of lumbar region    a. 10/2018 MRI L2-3 acute discitis osteomyelitis.   Endocarditis    a. 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   History of prostatectomy    Hyperlipidemia    Hypertension    Lumbar spinal stenosis    a. 10/2018 MRI: mild L2-3 and mod L3-4 spinal canal stenosis.   PAD (peripheral artery disease) ([place]) 06/06/2017   TIA (transient ischemic attack)    a. 10/2018   Vertebral artery occlusion, left    a. 10/2018 CTA head/neck: Occlusion of prox V4 segment of the nondominant L vertebral artery - unknown chronicity.    Past Surgical History:  Procedure Laterality Date   CARDIAC CATHETERIZATION     EF of 66%   CARDIAC VALVE REPLACEMENT  2006   CORONARY ARTERY BYPASS GRAFT  1987   x5   I&D EXTREMITY Left 01/10/2015   Procedure: DEBRIDEMENT ANKLE PLACEMENT ANTIBIOTIC BEADS AND WOUND VAC;  Surgeon: [name], MD;  Location: [facility];  Service: Orthopedics;  Laterality: Left;   IR LUMBAR DISC ASPIRATION W/IMG GUIDE  11/10/2018   TEE WITHOUT CARDIOVERSION N/A 12/18/2018   Procedure: TRANSESOPHAGEAL ECHOCARDIOGRAM (TEE);  Surgeon: Jerline Pain, MD;  Location: [facility] ENDOSCOPY;  Service: Cardiovascular;  Laterality: N/A;    Current Medications: Outpatient Medications Prior to Visit  Medication Sig Dispense Refill   acetaminophen (TYLENOL) 500 MG tablet Take 1,000 mg by mouth every 6 (six) hours as needed (for pain).     amLODipine (NORVASC) 5 MG tablet Take 5 mg by mouth daily.     amoxicillin (AMOXIL) 500 MG capsule Take 1 capsule (500 mg total) by mouth 3 (three) times daily. 90 capsule 3   aspirin EC 81 MG tablet Take 81 mg by mouth daily.       Calcium Carbonate (CALCIUM 600 PO) Take 600 mg by mouth daily.     Cholecalciferol (VITAMIN D-3 PO) Take 1 capsule by mouth daily.     folic acid (FOLVITE) 638 MCG tablet Take 400 mcg by mouth 2 (two) times a week.      Glucosamine HCl (GLUCOSAMINE PO) Take 1 tablet by mouth daily.     HYDROcodone-acetaminophen (NORCO/VICODIN) 5-325 MG tablet Take 1-2 tablets by mouth every 4 (four) hours as needed for moderate pain. 30 tablet 0   lisinopril (ZESTRIL) 20 MG tablet Take 1 tablet (20 mg total) by mouth daily. 90 tablet 0   loperamide (IMODIUM) 2 MG capsule Take 1 capsule (2 mg total) by mouth 4 (four) times daily as needed for diarrhea or loose stools. 30 capsule 0   Multiple Vitamin (THERAGRAN PO) Take 1 tablet by mouth daily.     timolol (TIMOPTIC) 0.5 % ophthalmic solution Place 1 drop into the right eye 2 (two) times daily.   3  vitamin C (ASCORBIC ACID) 500 MG tablet Take 500 mg by mouth daily.     apixaban (ELIQUIS) 5 MG TABS tablet Take 1 tablet (5 mg total) by mouth 2 (two) times daily. 180 tablet 0   metoprolol succinate (TOPROL-XL) 100 MG 24 hr tablet Take 1 tablet (100 mg total) by mouth daily. Take with or immediately following a meal. 90 tablet 1   No facility-administered medications prior to visit.      Allergies:   Patient has no known allergies.   Social History   Socioeconomic History   Marital status: Married    Spouse name: Not on file   Number of children: Not on file   Years of education: Not on file   Highest education level: Not on file  Occupational History   Not on file  Social Needs   Financial resource strain: Not on file   Food insecurity    Worry: Not on file    Inability: Not on file   Transportation needs    Medical: Not on file    Non-medical: Not on file  Tobacco Use   Smoking status: Former Smoker    Quit date: 11/18/1955    Years since quitting: 63.2   Smokeless tobacco: Never Used  Substance and Sexual Activity    Alcohol use: No   Drug use: No   Sexual activity: Not on file  Lifestyle   Physical activity    Days per week: Not on file    Minutes per session: Not on file   Stress: Not on file  Relationships   Social connections    Talks on phone: Not on file    Gets together: Not on file    Attends religious service: Not on file    Active member of club or organization: Not on file    Attends meetings of clubs or organizations: Not on file    Relationship status: Not on file  Other Topics Concern   Not on file  Social History Narrative   Not on file     Family History:  The patient's family history includes Congestive Heart Failure in his sister and sister; Heart attack in his brother; Heart disease in his father; Heart failure in his mother.   ROS:   Please see the history of present illness.    ROS All other systems reviewed and are negative.   PHYSICAL EXAM:   VS:  BP (!) 157/71    Pulse 65    Temp 97.7 F (36.5 C)    Ht 5' 6.5" (1.689 m)    Wt 167 lb 9.6 oz (76 kg)    BMI 26.65 kg/m    GEN: Well nourished, well developed, in no acute distress  HEENT: normal  Neck: no JVD, carotid bruits, or masses Cardiac: RRR; no murmurs, rubs, or gallops,no edema  Respiratory:  clear to auscultation bilaterally, normal work of breathing GI: soft, nontender, nondistended, + BS MS: no deformity or atrophy  Skin: warm and dry, no rash Neuro:  Alert and Oriented x 3, Strength and sensation are intact Psych: euthymic mood, full affect  Wt Readings from Last 3 Encounters:  02/04/19 167 lb 9.6 oz (76 kg)  01/07/19 159 lb 1.6 oz (72.2 kg)  01/01/19 167 lb (75.8 kg)      Studies/Labs Reviewed:   EKG:  EKG is ordered today.  The ekg ordered today demonstrates normal sinus rhythm with PACs, T wave inversion in inferior leads.  Recent Labs: 01/07/2019: ALT  18; BUN 33; Creatinine, Ser 1.50; Hemoglobin 9.2; Magnesium 2.0; Platelets 188; Potassium 3.5; Sodium 133   Lipid Panel      Component Value Date/Time   CHOL 116 11/15/2018 0430   TRIG 127 11/15/2018 0430   HDL 28 (L) 11/15/2018 0430   CHOLHDL 4.1 11/15/2018 0430   VLDL 25 11/15/2018 0430   LDLCALC 63 11/15/2018 0430    Additional studies/ records that were reviewed today include:   TEE 12/18/2018 IMPRESSIONS    1. The left ventricle has normal systolic function, with an ejection fraction of 60-65%. The cavity size was normal. No evidence of left ventricular regional wall motion abnormalities.  2. The right ventricle has normal systolc function. The cavity was normal. There is no increase in right ventricular wall thickness.  3. Mitral valve regurgitation is mild to moderate by color flow Doppler.  4. A bioprosthesis valve is present in the aortic position. Procedure Date: 2006 Normal aortic valve prosthesis. Echo findings shows no evidence of dehiscence, regurgitation, perivalvular leak or abscess of the aortic prosthesis.  5. Aortic valve regurgitation was not assessed by color flow Doppler.  6. There is evidence of mild plaque in the descending aorta.   Myoview 01/06/2019  Defect 1: There is a non- reversible medium defect of moderate severity present in the basal inferoseptal, basal inferior, basal inferolateral, mid anteroseptal, mid inferoseptal, mid inferior and mid inferolateral location.. This could represent scarring  Nuclear stress EF: 52%. The left ventricular ejection fraction is mildly decreased (45-54%).  This is a low risk study. Compared to the previous myoview study in December, 2015, there are no changes.   ASSESSMENT:    1. PAF (paroxysmal atrial fibrillation) ([place])   2. Coronary artery disease involving coronary bypass graft of native heart without angina pectoris   3. S/P AVR (aortic valve replacement)   4. Endocarditis, unspecified chronicity, unspecified endocarditis type   5. Essential hypertension   6. Hyperlipidemia LDL goal <70   7. Transient ischemic attack (TIA)   8.  Lung nodule      PLAN:  In order of problems listed above:  1. PAF: On Eliquis and metoprolol.  Maintaining sinus rhythm.  Patient was recently seen by Dr. Harrell Gave during the hospitalization, she question interval either he truly had A. fib event versus sinus tachycardia with PACs.  She recommended outpatient 30-day event monitor, I will arrange.  2. CAD s/p CABG: Recent Myoview showed scar but no ischemia.   3. History of AVR: Stable on recent TEE  4. Hypertension: Continue current blood pressure medication  5. Hyperlipidemia: Most recent lipid panel obtained in May 2020 showed cholesterol at goal on no statins  6. History of TIA: 2 episodes of TIA since last year.  Neurology work-up negative.  Patient has been started on Eliquis for possible A. fib since then.  7. History of lung nodule: Seen on previous CT of the chest in May 2020, consider repeat CT by primary care provider.  Recommended screening.  3 to 6 months after last CT.  8. History of endocarditis: Finished a course of IV antibiotic.  Treated by infectious disease.   Medication Adjustments/Labs and Tests Ordered: Current medicines are reviewed at length with the patient today.  Concerns regarding medicines are outlined above.  Medication changes, Labs and Tests ordered today are listed in the Patient Instructions below. Patient Instructions  Medication Instructions:  Your physician recommends that you continue on your current medications as directed. Please refer to the Current Medication list  given to you today.  If you need a refill on your cardiac medications before your next appointment, please call your pharmacy.   Lab work: NONE ordered at this time of appointment   If you have labs (blood work) drawn today and your tests are completely normal, you will receive your results only by:  [place] (if you have MyChart) OR  A paper copy in the mail If you have any lab test that is abnormal or we need to  change your treatment, we will call you to review the results.  Testing/Procedures: Your physician has requested that you have an echocardiogram. Echocardiography is a painless test that uses sound waves to create images of your heart. It provides your doctor with information about the size and shape of your heart and how well your hearts chambers and valves are working. This procedure takes approximately one hour. There are no restrictions for this procedure.  Your physician has recommended that you wear an event monitor for 30 days. Event monitors are medical devices that record the hearts electrical activity. Doctors most often [place] these monitors to diagnose arrhythmias. Arrhythmias are problems with the speed or rhythm of the heartbeat. The monitor is a small, portable device. You can wear one while you do your normal daily activities. This is usually used to diagnose what is causing palpitations/syncope (passing out).  Follow-Up: At [facility], you and your health needs are our priority.  As part of our continuing mission to provide you with exceptional heart care, we have created designated Provider Care Teams.  These Care Teams include your primary Cardiologist (physician) and Advanced Practice Providers (APPs -  Physician Assistants and Nurse Practitioners) who all work together to provide you with the care you need, when you need it.  Your physician recommends that you keep your scheduled appointment in September with Denice Bors. Stanford Breed, MD   Any Other Special Instructions Will Be Listed Below (If Applicable).       Randy Chen, [place]  02/05/2019 11:24 PM    [facility] [address], [place], [place]  [address] Phone: [phone]; Fax: [phone]

## 2019-02-04 NOTE — Telephone Encounter (Signed)
Enrolled patient for a 30 day [employer] Event monitor to be mailed. Brief instructions were left on voicemail per DPR and patient knows to expect the monitor to arrive in 3-4 days.

## 2019-02-05 ENCOUNTER — Encounter: Payer: Self-pay | Admitting: Physician Assistant

## 2019-02-07 ENCOUNTER — Ambulatory Visit (INDEPENDENT_AMBULATORY_CARE_PROVIDER_SITE_OTHER): Payer: Medicare Other

## 2019-02-07 DIAGNOSIS — I480 Paroxysmal atrial fibrillation: Secondary | ICD-10-CM | POA: Diagnosis not present

## 2019-03-04 ENCOUNTER — Encounter: Payer: Self-pay | Admitting: Infectious Disease

## 2019-03-04 ENCOUNTER — Other Ambulatory Visit: Payer: Self-pay

## 2019-03-04 ENCOUNTER — Ambulatory Visit: Payer: Medicare Other | Admitting: Infectious Disease

## 2019-03-04 VITALS — BP 149/71 | HR 55 | Temp 97.6°F

## 2019-03-04 DIAGNOSIS — M4620 Osteomyelitis of vertebra, site unspecified: Secondary | ICD-10-CM | POA: Diagnosis not present

## 2019-03-04 DIAGNOSIS — N183 Chronic kidney disease, stage 3 unspecified: Secondary | ICD-10-CM

## 2019-03-04 DIAGNOSIS — R7881 Bacteremia: Secondary | ICD-10-CM

## 2019-03-04 DIAGNOSIS — B952 Enterococcus as the cause of diseases classified elsewhere: Secondary | ICD-10-CM

## 2019-03-04 DIAGNOSIS — Z952 Presence of prosthetic heart valve: Secondary | ICD-10-CM | POA: Diagnosis not present

## 2019-03-04 NOTE — Progress Notes (Signed)
Subjective:   Chief complaint    Patient ID: Randy Chen, male    DOB: 1927/10/04, 85 y.o.   MRN: [id number]  HPI  [name] is a 85 y.o. male history of a prosthetic aortic valve who was admitted with ampicillin sensitive enterococcal bacteremia and severe low back pain.  He had a transthoracic echocardiogram while inpatient which did not show evidence of endocarditis. We did not pursue a transesophageal echocardiogram at that time instead pursued empiric treatment for bacterial endocarditis.  Note when he initially presented back in April he did have severe back pain that was being blamed on a urinary tract infection.  At the time I was highly skeptical that the amount of pain he had was due to urinary tract infection and we imaged his back with an MRI of his thoracic and lumbosacral area.  We found no pathology on imaging.  That being said I still had a high suspicion that he might have infection at a microscopic level that we could not seen with imaging.  Because of this and because we at that time had not excluded endocarditis of his prosthetic valve we had opted for 6-week course of dual beta-lactam therapy with ampicillin and ceftriaxone.  Instead he also did have a transesophageal echocardiogram that did not show evidence of vegetations  When I had seen the patient in clinic and gone back over his microbiology I was disconcerted to find that he had had repeatedly positive blood cultures with enterococcus prior to placement of his PICC line.  I saw him in clinic in follow-up and we had planned on completing a course of therapy.  However in the interim helped severe spasms of lower back pain precipitated when he had to go up to the bathroom at night.  He also was having pubic pain as well.  He was evaluated at [facility] and had a CT scan that did not show any evidence of intra-abdominal pathology but showed evidence of discitis and vertebral  osteomyelitis.  He has been admitted to [facility] and undergone MRI which showed 1. L2-3 acute discitis osteomyelitis. Erosive changes of opposing endplates and mild anterior loss of height of L2 vertebral body. 2. Anterior epidural inflammation from L2-L4 with soft tissue thickening, no discrete abscess at this time. Enhancement within dorsal L4 vertebral body likely represents developing osteomyelitis from epidural spread. 3. Small right psoas muscle abscess. Extensive inflammation of bilateral psoas muscles and lumbar paraspinal muscles likely representing a combination of myositis and muscle strain. 4. Mild L2-3 and moderate L3-4 spinal canal stenosis due to thickening of the anterior epidural soft tissues combined with lumbar spondylosis. 5. Stable background of lumbar spondylosis greatest at the L4-5 and L5-S1 levels  Radiology  aspirated the disc space and sent for culture.  He was continued on dual beta-lactam therapy  He was originally told to complete his therapy for empiric treatment for prosthetic valve endocarditis today.  I saw him as an inpatient at [facility].  I thought that his ceftriaxone has been discontinued since we have achieved 6 weeks of antimicrobial therapy targeted at endocarditis but ultimately it seems that he was discharged on both ampicillin and ceftriaxone.  My partner Dr. Megan Salon had planned on giving him a total of 10 weeks of therapy for 6 weeks he had before +4 additional weeks.  When I saw him in June he was having worsening back pain versus how he was feeling a few days and weeks  prior  This is disconcerting given the findings found on MRI recently.  Therefore I extended  his ampicillin for an additional month and obtained an MRI of his lumbosacral spine.  MRI was done at [facility] facility on July 2 and compared to an April scan at [facility] that did not show facet pathology, it is unfortunate this was not compared to a May  scan which did show the known discitis and osteomyelitis including a psoas muscle abscess.  It was difficult for me to interpret the findings of this MRI since is not compared to the one in May.   IMPRESSION: 1. L2-L3 discitis-osteomyelitis with myositis of the adjacent psoas muscles. Severe spinal canal stenosis and bilateral neural foraminal stenosis secondary to bulging of the disc space. 2. Ventral epidural enhancement at the L2-4 levels and dorsal epidural enhancement at the L3-4 level may indicate phlegmon/developing epidural abscess. It is possible that the ventral epidural abnormality is due to engorgement of the lumbar venous plexus. 3. Moderate bilateral L4 neural foraminal stenosis.  The patient himself feels better.  His wife accompanies him states that he is walking more during the day eating more and his strength is improved.  He has less back pain during the day.  However at night he does have back pain that goes up to a 7 out of 10 in severity and makes it difficult for him to sleep.  He has some numbness in his legs and restless legs associated with this pain.  He was able to sleep if he takes an opioid pain medication at night.  Pain is certainly not worse than a month ago but still continues.  He did not have any lower extremity weakness whatsoever and as mentioned is actually improving in strength  I changed him to 2 months additionally of  oral amoxicillin after he completed his ampicillin.  Since then he has been largely doing better he is had improvement in his lower extremity strength.  He is walking sometimes without a walking in his independent living center.  He did have a spasm of lower back pain with radiation and sciatic type symptoms down both legs.  This had last happened 3 weeks ago and is happening less frequently.    Past Medical History:  Diagnosis Date  . Aortic stenosis    a. 2006 s/p bioprosthetic AVR; b. 12/2018 TEE: EF 60-65%, Nl AoV fxn w/o  dehiscence, regurgitation, perivalvular leak, or abscess.  . Bacteremia due to Enterococcus 09/2018   a. ? discitis vs SBE; b. 12/2018 TEE: No evidence of vegetation, nl fxn'ing bioprosthetic AoV.  . Cancer ([place])   . Carotid arterial disease ([place])    a. 10/2018 Carotid U/S: 1-39% bilat ICA dzs.  . Coronary artery disease    a. 1987 s/p CABG x 5; 05/2014 MV: EF 70%, no significant ischemia.  . Discitis of lumbar region    a. 10/2018 MRI L2-3 acute discitis osteomyelitis.  . Endocarditis    a. 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  [name] History of prostatectomy   . Hyperlipidemia   . Hypertension   . Lumbar spinal stenosis    a. 10/2018 MRI: mild L2-3 and mod L3-4 spinal canal stenosis.  [name] PAD (peripheral artery disease) ([place]) 06/06/2017  . TIA (transient ischemic attack)    a. 10/2018  . Vertebral artery occlusion, left    a. 10/2018 CTA head/neck: Occlusion of prox V4 segment of the nondominant L vertebral artery -  unknown chronicity.    Past Surgical History:  Procedure Laterality Date  . CARDIAC CATHETERIZATION     EF of 66%  . CARDIAC VALVE REPLACEMENT  2006  . CORONARY ARTERY BYPASS GRAFT  1987   x5  . I&D EXTREMITY Left 01/10/2015   Procedure: DEBRIDEMENT ANKLE PLACEMENT ANTIBIOTIC BEADS AND WOUND VAC;  Surgeon: [name], MD;  Location: [place];  Service: Orthopedics;  Laterality: Left;  . IR LUMBAR [place] W/IMG GUIDE  11/10/2018  . TEE WITHOUT CARDIOVERSION N/A 12/18/2018   Procedure: TRANSESOPHAGEAL ECHOCARDIOGRAM (TEE);  Surgeon: Jerline Pain, MD;  Location: [facility]COPY;  Service: Cardiovascular;  Laterality: N/A;    Family History  Problem Relation Age of Onset  . Heart disease Father   . Heart failure Mother   . Heart attack Brother   . Congestive Heart Failure Sister   . Congestive Heart Failure Sister       Social History   Socioeconomic History  . Marital status: Married     Spouse name: Not on file  . Number of children: Not on file  . Years of education: Not on file  . Highest education level: Not on file  Occupational History  . Not on file  Social Needs  . Financial resource strain: Not on file  . Food insecurity    Worry: Not on file    Inability: Not on file  . Transportation needs    Medical: Not on file    Non-medical: Not on file  Tobacco Use  . Smoking status: Former Smoker    Quit date: 11/18/1955    Years since quitting: 63.3  . Smokeless tobacco: Never Used  Substance and Sexual Activity  . Alcohol use: No  . Drug use: No  . Sexual activity: Not on file  Lifestyle  . Physical activity    Days per week: Not on file    Minutes per session: Not on file  . Stress: Not on file  Relationships  . Social Herbalist on phone: Not on file    Gets together: Not on file    Attends religious service: Not on file    Active member of club or organization: Not on file    Attends meetings of clubs or organizations: Not on file    Relationship status: Not on file  Other Topics Concern  . Not on file  Social History Narrative  . Not on file    No Known Allergies   Current Outpatient Medications:  .  amoxicillin (AMOXIL) 500 MG capsule, Take 1 capsule (500 mg total) by mouth 3 (three) times daily., Disp: 90 capsule, Rfl: 3 .  acetaminophen (TYLENOL) 500 MG tablet, Take 1,000 mg by mouth every 6 (six) hours as needed (for pain)., Disp: , Rfl:  .  amLODipine (NORVASC) 5 MG tablet, Take 5 mg by mouth daily., Disp: , Rfl:  .  apixaban (ELIQUIS) 5 MG TABS tablet, Take 1 tablet (5 mg total) by mouth 2 (two) times daily., Disp: 60 tablet, Rfl: 5 .  aspirin EC 81 MG tablet, Take 81 mg by mouth daily., Disp: , Rfl:  .  Calcium Carbonate (CALCIUM 600 PO), Take 600 mg by mouth daily., Disp: , Rfl:  .  Cholecalciferol (VITAMIN D-3 PO), Take 1 capsule by mouth daily., Disp: , Rfl:  .  folic acid (FOLVITE) A999333 MCG tablet, Take 400 mcg by mouth 2  (two) times a week. , Disp: , Rfl:  .  Glucosamine HCl (GLUCOSAMINE PO), Take 1 tablet by mouth daily., Disp: , Rfl:  .  HYDROcodone-acetaminophen (NORCO/VICODIN) 5-325 MG tablet, Take 1-2 tablets by mouth every 4 (four) hours as needed for moderate pain., Disp: 30 tablet, Rfl: 0 .  lisinopril (ZESTRIL) 20 MG tablet, Take 1 tablet (20 mg total) by mouth daily., Disp: 90 tablet, Rfl: 0 .  loperamide (IMODIUM) 2 MG capsule, Take 1 capsule (2 mg total) by mouth 4 (four) times daily as needed for diarrhea or loose stools., Disp: 30 capsule, Rfl: 0 .  metoprolol succinate (TOPROL-XL) 100 MG 24 hr tablet, Take 1 tablet (100 mg total) by mouth daily. Take with or immediately following a meal., Disp: 90 tablet, Rfl: 1 .  Multiple Vitamin (THERAGRAN PO), Take 1 tablet by mouth daily., Disp: , Rfl:  .  timolol (TIMOPTIC) 0.5 % ophthalmic solution, Place 1 drop into the right eye 2 (two) times daily. , Disp: , Rfl: 3 .  vitamin C (ASCORBIC ACID) 500 MG tablet, Take 500 mg by mouth daily., Disp: , Rfl:     Review of Systems  Constitutional: Negative for activity change, appetite change, chills, diaphoresis, fatigue, fever and unexpected weight change.  HENT: Negative for congestion, rhinorrhea, sinus pressure, sneezing, sore throat and trouble swallowing.   Eyes: Negative for photophobia and visual disturbance.  Respiratory: Negative for cough, chest tightness, shortness of breath, wheezing and stridor.   Cardiovascular: Negative for chest pain, palpitations and leg swelling.  Gastrointestinal: Negative for abdominal distention, abdominal pain, anal bleeding, blood in stool, constipation, diarrhea, nausea and vomiting.  Genitourinary: Negative for difficulty urinating, dysuria, flank pain and hematuria.  Musculoskeletal: Positive for arthralgias and back pain. Negative for gait problem, joint swelling and myalgias.  Skin: Negative for color change, pallor, rash and wound.  Neurological: Positive for  numbness. Negative for dizziness, tremors, weakness and light-headedness.  Hematological: Negative for adenopathy. Does not bruise/bleed easily.  Psychiatric/Behavioral: Negative for agitation, behavioral problems, confusion, decreased concentration and dysphoric mood.       Objective:   Physical Exam Constitutional:      Appearance: He is well-developed.  HENT:     Head: Normocephalic and atraumatic.  Eyes:     Conjunctiva/sclera: Conjunctivae normal.  Neck:     Musculoskeletal: Normal range of motion and neck supple.  Cardiovascular:     Rate and Rhythm: Normal rate and regular rhythm.     Heart sounds: No murmur. No friction rub. No gallop.   Pulmonary:     Effort: Pulmonary effort is normal. No respiratory distress.     Breath sounds: No wheezing.  Abdominal:     General: There is no distension.     Palpations: Abdomen is soft.  Musculoskeletal: Normal range of motion.        General: No tenderness.  Skin:    General: Skin is warm and dry.     Coloration: Skin is not pale.     Findings: No erythema or rash.  Neurological:     General: No focal deficit present.     Mental Status: He is alert and oriented to person, place, and time.     Motor: Motor function is intact. No tremor, atrophy or abnormal muscle tone.  Psychiatric:        Mood and Affect: Mood normal.        Behavior: Behavior normal.        Thought Content: Thought content normal.        Judgment: Judgment normal.  PICC line is clean dry and intact       Assessment & Plan:   Ampicillin sensitive enterococcal bacteremia status post 6 weeks of IV antibiotics targeted at possible endocarditis given the presence of a prosthetic valve and the fact that he did not ever have a transesophageal echocardiogram during his initial admission.  He did have severe back pain which I suspected was due to discitis or vertebral osteomyelitis.  However initial MRI imaging did not show this.  He subsequently is been found  to have vertebral osteomyelitis and discitis with potential beginnings of an epidural infection.  He was on a protracted course of ampicillin and ceftriaxone combined, I then changed him to ampicillin alone and he is continued on therapy with this.  While the MRI at [facility] continues to show significant pathology it does not appear much different than the imaging done here at [facility].  Today he continues to seem improved its bothersome that he had such a bad episode of pain last night but I do not think we should overreact to the specific episode  We will recheck inflammatory markers today and if they are encouraging I will have him stop his amoxicillin and see [place] back in 2 months time.  Need for foot flu vaccine he will have his flu vaccine given at the independent living center where he resides

## 2019-03-05 ENCOUNTER — Telehealth: Payer: Self-pay

## 2019-03-05 ENCOUNTER — Ambulatory Visit (INDEPENDENT_AMBULATORY_CARE_PROVIDER_SITE_OTHER): Payer: Medicare Other | Admitting: Adult Health

## 2019-03-05 ENCOUNTER — Encounter: Payer: Self-pay | Admitting: Adult Health

## 2019-03-05 VITALS — BP 123/57 | HR 56 | Temp 97.8°F | Ht 66.5 in | Wt 174.4 lb

## 2019-03-05 DIAGNOSIS — R479 Unspecified speech disturbances: Secondary | ICD-10-CM

## 2019-03-05 DIAGNOSIS — I10 Essential (primary) hypertension: Secondary | ICD-10-CM | POA: Diagnosis not present

## 2019-03-05 DIAGNOSIS — I480 Paroxysmal atrial fibrillation: Secondary | ICD-10-CM

## 2019-03-05 DIAGNOSIS — E785 Hyperlipidemia, unspecified: Secondary | ICD-10-CM

## 2019-03-05 LAB — CBC WITH DIFFERENTIAL/PLATELET
Absolute Monocytes: 672 cells/uL (ref 200–950)
Basophils Absolute: 16 cells/uL (ref 0–200)
Basophils Relative: 0.2 %
Eosinophils Absolute: 122 cells/uL (ref 15–500)
Eosinophils Relative: 1.5 %
HCT: 33.5 % — ABNORMAL LOW (ref 38.5–50.0)
Hemoglobin: 10.6 g/dL — ABNORMAL LOW (ref 13.2–17.1)
Lymphs Abs: 2957 cells/uL (ref 850–3900)
MCH: 29 pg (ref 27.0–33.0)
MCHC: 31.6 g/dL — ABNORMAL LOW (ref 32.0–36.0)
MCV: 91.5 fL (ref 80.0–100.0)
MPV: 10.7 fL (ref 7.5–12.5)
Monocytes Relative: 8.3 %
Neutro Abs: 4334 cells/uL (ref 1500–7800)
Neutrophils Relative %: 53.5 %
Platelets: 195 10*3/uL (ref 140–400)
RBC: 3.66 10*6/uL — ABNORMAL LOW (ref 4.20–5.80)
RDW: 14.5 % (ref 11.0–15.0)
Total Lymphocyte: 36.5 %
WBC: 8.1 10*3/uL (ref 3.8–10.8)

## 2019-03-05 LAB — BASIC METABOLIC PANEL WITH GFR
BUN/Creatinine Ratio: 22 (calc) (ref 6–22)
BUN: 35 mg/dL — ABNORMAL HIGH (ref 7–25)
CO2: 28 mmol/L (ref 20–32)
Calcium: 9.7 mg/dL (ref 8.6–10.3)
Chloride: 104 mmol/L (ref 98–110)
Creat: 1.59 mg/dL — ABNORMAL HIGH (ref 0.70–1.11)
GFR, Est African American: 44 mL/min/{1.73_m2} — ABNORMAL LOW (ref >=60)
GFR, Est Non African American: 38 mL/min/{1.73_m2} — ABNORMAL LOW (ref >=60)
Glucose, Bld: 103 mg/dL — ABNORMAL HIGH (ref 65–99)
Potassium: 5.1 mmol/L (ref 3.5–5.3)
Sodium: 138 mmol/L (ref 135–146)

## 2019-03-05 LAB — C-REACTIVE PROTEIN: CRP: 0.7 mg/L (ref <8.0)

## 2019-03-05 LAB — SEDIMENTATION RATE: Sed Rate: 17 mm/h (ref 0–20)

## 2019-03-05 NOTE — Telephone Encounter (Signed)
-----   Message from Truman Hayward, MD sent at 03/05/2019  9:01 AM EDT ----- His inflammatory markers are improved and he can discontinue his amoxicillin.  His serum creatinine is worsened at 1.59 and this should be followed up by his primary care physician this week

## 2019-03-05 NOTE — Telephone Encounter (Signed)
Patients labs faxed to Dr [name] with [facility].    Laverle Patter, RN

## 2019-03-05 NOTE — Progress Notes (Signed)
I agree with the above plan 

## 2019-03-05 NOTE — Progress Notes (Signed)
[name] [address] [place]. [place] [address] [phone]       HOSPITAL FOLLOW UP NOTE  Mr. Randy Chen Date of Birth:  [birth year] Medical Record Number:  [id number]   Reason for Referral:  hospital follow up    CHIEF COMPLAINT:  Chief Complaint  Patient presents with   Hospitalization Follow-up    Wife present. Rm 9. No new concerns at this time.     HPI: Randy Folwell Hoffmanis being seen today for in office hospital follow-up regarding seizure versus degenerative disorder on 01/05/2027.  History obtained from patient, wife and chart review. Reviewed all radiology images and labs personally.  Randy Chen is a 85 y.o. 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  Similar episode few weeks that lasted several days.  Neurology consulted who felt symptoms likely related to seizure versus degenerative disorder with low suspicion of TIA due to new AF is is is a second event with the same symptoms.  CT head unremarkable for acute abnormality but did show old right cerebellar infarct.  CTA head/neck showed arthrosclerosis bilateral carotid bifurcations, left ICA 30% stenosis, extensive aortic arthrosclerosis and left VA occlusion at C1 level.  CT perfusion negative.  MRI negative for acute abnormality with evidence of moderate to severe small vessel disease.  2D echo normal EF without cardiac source of this identified an AV prosthesis normal.  EEG negative for seizure activity.  LDL 63 and A1c 5.9.  New diagnosis of atrial fibrillation during admission and recommended initiating Eliquis for secondary stroke prevention along with continuation of aspirin 81 mg daily for history of AVR.  HTN stable.  LDL 63  without indication of initiating statin.  Other stroke risk factors include advanced age, former tobacco use, history of TIA 10/2018, CAD status post CABG and PAD.  Residual deficit of cognitive decline with potential underlying cognitive difficulties  Randy Chen is being seen today for hospital follow-up accompanied by his wife.  He has been doing well from a neurological standpoint without recurrent speech difficulties. Cognition has greatly improved where he has no residual difficulties with memory or cognition.  He continues to live with his wife in independent living facility.  Wife also endorses improvement of appetite, sleeping and ambulation.  He has continued on Eliquis and aspirin 81 mg without bleeding or bruising.  Recently evaluated by cardiology and recommended undergoing 30-day cardiac event monitor to assess for atrial fibrillation burden.  He will complete monitor in a couple days.  Blood pressure today 123/57.  Denies new or worsening stroke/TIA symptoms or seizure-like activity.    ROS:   14 system review of systems performed and negative with exception of no complaints  PMH:  Past Medical History:  Diagnosis Date   Aortic stenosis    a. 2006 s/p bioprosthetic AVR; b. 12/2018 TEE: EF 60-65%, Nl AoV fxn w/o dehiscence, regurgitation, perivalvular leak, or abscess.   Bacteremia due to Enterococcus 09/2018   a. ? discitis vs SBE; b. 12/2018 TEE: No evidence of vegetation, nl fxn'ing bioprosthetic AoV.   Cancer [facility])    Carotid arterial disease ([place])    a. 10/2018 Carotid U/S: 1-39% bilat ICA dzs.   Coronary artery disease    a. 1987 s/p CABG x 5; 05/2014 MV: EF  70%, no significant ischemia.   Discitis of lumbar region    a. 10/2018 MRI L2-3 acute discitis osteomyelitis.   Endocarditis    a. 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   History of prostatectomy    Hyperlipidemia    Hypertension     Lumbar spinal stenosis    a. 10/2018 MRI: mild L2-3 and mod L3-4 spinal canal stenosis.   PAD (peripheral artery disease) ([place]) 06/06/2017   TIA (transient ischemic attack)    a. 10/2018   Vertebral artery occlusion, left    a. 10/2018 CTA head/neck: Occlusion of prox V4 segment of the nondominant L vertebral artery - unknown chronicity.    PSH:  Past Surgical History:  Procedure Laterality Date   CARDIAC CATHETERIZATION     EF of 66%   CARDIAC VALVE REPLACEMENT  2006   CORONARY ARTERY BYPASS GRAFT  1987   x5   I&D EXTREMITY Left 01/10/2015   Procedure: DEBRIDEMENT ANKLE PLACEMENT ANTIBIOTIC BEADS AND WOUND VAC;  Surgeon: [name], MD;  Location: [place];  Service: Orthopedics;  Laterality: Left;   IR LUMBAR DISC ASPIRATION W/IMG GUIDE  11/10/2018   TEE WITHOUT CARDIOVERSION N/A 12/18/2018   Procedure: TRANSESOPHAGEAL ECHOCARDIOGRAM (TEE);  Surgeon: Jerline Pain, MD;  Location: [facility] ENDOSCOPY;  Service: Cardiovascular;  Laterality: N/A;    Social History:  Social History   Socioeconomic History   Marital status: Married    Spouse name: Not on file   Number of children: Not on file   Years of education: Not on file   Highest education level: Not on file  Occupational History   Not on file  Social Needs   Financial resource strain: Not on file   Food insecurity    Worry: Not on file    Inability: Not on file   Transportation needs    Medical: Not on file    Non-medical: Not on file  Tobacco Use   Smoking status: Former Smoker    Quit date: 11/18/1955    Years since quitting: 63.3   Smokeless tobacco: Never Used  Substance and Sexual Activity   Alcohol use: No   Drug use: No   Sexual activity: Not on file  Lifestyle   Physical activity    Days per week: Not on file    Minutes per session: Not on file   Stress: Not on file  Relationships   Social connections    Talks on phone: Not on file    Gets together: Not on file    Attends religious  service: Not on file    Active member of club or organization: Not on file    Attends meetings of clubs or organizations: Not on file    Relationship status: Not on file   Intimate partner violence    Fear of current or ex partner: Not on file    Emotionally abused: Not on file    Physically abused: Not on file    Forced sexual activity: Not on file  Other Topics Concern   Not on file  Social History Narrative   Not on file    Family History:  Family History  Problem Relation Age of Onset   Heart disease Father    Heart failure Mother    Heart attack Brother    Congestive Heart Failure Sister    Congestive Heart Failure Sister     Medications:   Current Outpatient Medications on File Prior  to Visit  Medication Sig Dispense Refill   acetaminophen (TYLENOL) 500 MG tablet Take 1,000 mg by mouth at bedtime.      amLODipine (NORVASC) 5 MG tablet Take 5 mg by mouth daily.     apixaban (ELIQUIS) 5 MG TABS tablet Take 1 tablet (5 mg total) by mouth 2 (two) times daily. 60 tablet 5   aspirin EC 81 MG tablet Take 81 mg by mouth daily.     atorvastatin (LIPITOR) 40 MG tablet Take 40 mg by mouth daily.     Calcium Carbonate (CALCIUM 600 PO) Take 600 mg by mouth daily.     Cholecalciferol (VITAMIN D-3 PO) Take 1 capsule by mouth daily.     folic acid (FOLVITE) A999333 MCG tablet Take 400 mcg by mouth 2 (two) times a week.      Glucosamine HCl (GLUCOSAMINE PO) Take 1 tablet by mouth daily.     HYDROcodone-acetaminophen (NORCO/VICODIN) 5-325 MG tablet Take 1-2 tablets by mouth every 4 (four) hours as needed for moderate pain. (Patient taking differently: Take 1 tablet by mouth as needed for moderate pain. ) 30 tablet 0   lisinopril (ZESTRIL) 20 MG tablet Take 1 tablet (20 mg total) by mouth daily. 90 tablet 0   loperamide (IMODIUM) 2 MG capsule Take 1 capsule (2 mg total) by mouth 4 (four) times daily as needed for diarrhea or loose stools. 30 capsule 0   metoprolol  succinate (TOPROL-XL) 100 MG 24 hr tablet Take 1 tablet (100 mg total) by mouth daily. Take with or immediately following a meal. 90 tablet 1   Multiple Vitamin (THERAGRAN PO) Take 1 tablet by mouth daily.     nitroGLYCERIN (NITROSTAT) 0.4 MG SL tablet Place 0.4 mg under the tongue every 5 (five) minutes as needed for chest pain.     timolol (TIMOPTIC) 0.5 % ophthalmic solution Place 1 drop into the right eye 2 (two) times daily.   3   vitamin C (ASCORBIC ACID) 500 MG tablet Take 500 mg by mouth daily.     No current facility-administered medications on file prior to visit.     Allergies:  No Known Allergies   Physical Exam  Vitals:   03/05/19 1252  BP: (!) 123/57  Pulse: (!) 56  Temp: 97.8 F (36.6 C)  TempSrc: Oral  Weight: 174 lb 6.4 oz (79.1 kg)  Height: 5' 6.5" (1.689 m)   Body mass index is 27.73 kg/m. No exam data present  General: well developed, well nourished,  pleasant elderly Caucasian male, seated, in no evident distress Head: head normocephalic and atraumatic.   Neck: supple with no carotid or supraclavicular bruits Cardiovascular: regular rate and rhythm, no murmurs Musculoskeletal: no deformity Skin:  no rash/petichiae Vascular:  Normal pulses all extremities   Neurologic Exam Mental Status: Awake and fully alert. Oriented to place and time. Recent and remote memory intact. Attention span, concentration and fund of knowledge appropriate. Mood and affect appropriate.  Cranial Nerves: Fundoscopic exam reveals sharp disc margins. Pupils equal, briskly reactive to light. Extraocular movements full without nystagmus. Visual fields full to confrontation. Hearing intact. Facial sensation intact. Face, tongue, palate moves normally and symmetrically.  Motor: Normal bulk and tone. Normal strength in all tested extremity muscles. Sensory.: intact to touch , pinprick , position and vibratory sensation.  Coordination: Rapid alternating movements normal in all  extremities. Finger-to-nose and heel-to-shin performed accurately bilaterally. Gait and Station: Arises from chair without difficulty. Stance is normal. Gait demonstrates normal stride length and  balance with assistance of rolling walker Reflexes: 1+ and symmetric. Toes downgoing.      Diagnostic Data (Labs, Imaging, Testing)   Code Stroke CT head No acute abnormality. Small vessel disease. Old R cerebellar infarct. ASPECTS 10.     CTA head & neck atherosclerosis bilateral carotid bifurcations.  L ICA 30% stenosis.  Extensive aortic atherosclerosis.  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  CT perfusion negative  MRI no acute abnormality.  Moderate to severe small vessel disease  2D Echo EF 60 to 65% with no source of embolus.  AV prosthesis normal  EEG no seizure  LDL 63  HgbA1c 5.5    ASSESSMENT: Randy Chen is a 85 y.o. year old male presented with transient word finding difficulty on 01/05/2019 during admission for chest pain on 01/04/2019.  Neurology consulted who felt symptoms likely related to seizure versus degenerative disorder.  Did not feel symptoms related to TIA with new diagnosis of A. fib and second event with the same symptoms. Vascular risk factors include history of TIAs, new diagnosis of atrial fibrillation, CAD status post CABG, aortic stenosis s/p AVR, PAD, HTN and HLD.  He has been doing well since discharge with improvement of his cognition without reoccurring transient speech difficulty or any other stroke/TIA/seizure symptoms.    PLAN:  1. Transient word finding difficulty: Discussion regarding possibly related to seizure activity versus degenerative changes.  He also endorses being in severe amount of pain at the time due to infection.  Advised to continue to monitor for now but if symptoms recur, may consider initiation of low-dose AED for potential simple partial seizure symptoms.  Continue Eliquis (apixaban) daily  and atorvastatin for stroke prevention.  He will  also continue aspirin 81 mg at this time but advised him that this is not indicated from a stroke prevention standpoint and to follow-up with cardiology for ongoing use.  Maintain strict control of hypertension with blood pressure goal below 130/90, diabetes with hemoglobin A1c goal below 6.5% and cholesterol with LDL cholesterol (bad cholesterol) goal below 70 mg/dL.  I also advised the patient to eat a healthy diet with plenty of whole grains, cereals, fruits and vegetables, exercise regularly with at least 30 minutes of continuous activity daily and maintain ideal body weight. 2. Atrial fibrillation: Continue Eliquis and ongoing follow-up with cardiology 3. HTN: Advised to continue current treatment regimen.  Advised to continue to monitor at home along with continued follow-up with PCP for management 4. HLD: Advised to continue current treatment regimen along with continued follow-up with PCP for future prescribing and monitoring of lipid panel   He request follow-up as needed as he has been doing well without recurrent episodes.  He was advised to call office with any questions, concerns or recurrent symptoms   Greater than 50% of time during this 45 minute visit was spent on counseling, explanation of diagnosis of seizure versus degenerative changes transient word finding difficulty, reviewing stroke prevention with management of atrial fibrillation, HTN and HLD planning of further management along with potential future management, and discussion with patient and family answering all questions.    Frann Rider, AGNP-BC  [facility] [address] [place], [place] [address]  Phone [phone] Fax [phone] Note: This document was prepared with digital dictation and possible smart phrase technology. Any transcriptional errors that result from this process are unintentional.

## 2019-03-05 NOTE — Patient Instructions (Addendum)
Continue to monitor for any recurrent symptoms - please notify us with any recurrent symptoms  Continue Eliquis (apixaban) daily and Lipitor for secondary stroke prevention  Continue aspirin 81mg  from a cardiac perspective - not indicated for stroke prevention - follow up with cardiology in regards to ongoing use  Continue to follow up with PCP regarding cholesterol and hypertension management   Continue to monitor blood pressure at home  Maintain strict control of hypertension with blood pressure goal below 130/90, diabetes with hemoglobin A1c goal below 6.5% and cholesterol with LDL cholesterol (bad cholesterol) goal below 70 mg/dL. I also advised the patient to eat a healthy diet with plenty of whole grains, cereals, fruits and vegetables, exercise regularly and maintain ideal body weight.         Thank you for coming to see [place] at [facility]. I hope we have been able to provide you high quality care today.  You may receive a patient satisfaction survey over the next few weeks. We would appreciate your feedback and comments so that we may continue to improve ourselves and the health of our patients.

## 2019-03-06 ENCOUNTER — Telehealth: Payer: Self-pay | Admitting: *Deleted

## 2019-03-06 NOTE — Telephone Encounter (Signed)
-----   Message from Truman Hayward, MD sent at 03/05/2019  9:01 AM EDT ----- His inflammatory markers are improved and he can discontinue his amoxicillin.  His serum creatinine is worsened at 1.59 and this should be followed up by his primary care physician this week

## 2019-03-06 NOTE — Telephone Encounter (Signed)
RN relayed results to patient, advice to follow up with his PCP regarding creatinine.  Patient will stop amoxicillin, will call PCP.  RN will send labs to PCP as well ( Dr [name] at [facility] - [phone]). Landis Gandy, RN

## 2019-03-06 NOTE — Telephone Encounter (Signed)
Thanks so much Michelle 

## 2019-03-10 NOTE — Progress Notes (Signed)
HPI: Follow-up coronary artery disease and prior aortic valve replacement. Patient is status post coronary artery bypass and graft in 1987 and had aortic valve replacement with a bioprosthetic valve in 2006. Patient has known peripheral vascular disease and was evaluated by Dr. Gwenlyn Found and medical therapy recommended. Had enterococcal bacteremia April 2020.  Has been treated with antibiotics.  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  Had discitis May 2020.  Also noted to have right lower lobe nodule and follow-up recommended 3 to 6 months.  Echocardiogram April 2020 showed normal LV function, moderate left atrial enlargement, bioprosthetic aortic valve with no obvious vegetation and normal mean gradient.  Follow-up study May 2020 unchanged.  Carotid Dopplers May 2020 showed 1 to 39% bilateral stenosis.  CTA May 2020 showed occlusion of left vertebral artery and atherosclerosis but no significant obstruction of carotids.  Had TIA May 2020.   Transesophageal echocardiogram July 2020 showed normal LV function, mild to moderate mitral regurgitation, bioprosthetic aortic valve with no significant vegetation noted.  CTA July 2020 showed 30% stenosis on the left, occluded left vertebral.  Nuclear study July 2020 showed ejection fraction 52%, inferior defect suggestive of scar versus diaphragmatic attenuation but no ischemia.  Also with question of atrial fibrillation during recent hospitalization.  Since last seen patient denies dyspnea, chest pain, palpitations or syncope.  Current Outpatient Medications  Medication Sig Dispense Refill  . acetaminophen (TYLENOL) 500 MG tablet Take 1,000 mg by mouth at bedtime.     [name] amLODipine (NORVASC) 5 MG tablet Take 5 mg by mouth daily.    [name] apixaban (ELIQUIS) 5 MG TABS tablet Take 1 tablet (5 mg total) by mouth 2 (two) times daily. 60 tablet 5  . aspirin EC 81 MG tablet Take 81 mg by mouth daily.    [name] atorvastatin (LIPITOR) 40 MG tablet Take 40 mg by mouth  daily.    . Calcium Carbonate (CALCIUM 600 PO) Take 600 mg by mouth daily.    . Cholecalciferol (VITAMIN D-3 PO) Take 1 capsule by mouth daily.    . folic acid (FOLVITE) A999333 MCG tablet Take 400 mcg by mouth 2 (two) times a week.     . Glucosamine HCl (GLUCOSAMINE PO) Take 1 tablet by mouth daily.    [name] HYDROcodone-acetaminophen (NORCO/VICODIN) 5-325 MG tablet Take 1-2 tablets by mouth every 4 (four) hours as needed for moderate pain. (Patient taking differently: Take 1 tablet by mouth as needed for moderate pain. ) 30 tablet 0  . lisinopril (ZESTRIL) 20 MG tablet Take 1 tablet (20 mg total) by mouth daily. 90 tablet 0  . loperamide (IMODIUM) 2 MG capsule Take 1 capsule (2 mg total) by mouth 4 (four) times daily as needed for diarrhea or loose stools. 30 capsule 0  . metoprolol succinate (TOPROL-XL) 100 MG 24 hr tablet Take 1 tablet (100 mg total) by mouth daily. Take with or immediately following a meal. 90 tablet 1  . Multiple Vitamin (THERAGRAN PO) Take 1 tablet by mouth daily.    . nitroGLYCERIN (NITROSTAT) 0.4 MG SL tablet Place 0.4 mg under the tongue every 5 (five) minutes as needed for chest pain.    [name] timolol (TIMOPTIC) 0.5 % ophthalmic solution Place 1 drop into the right eye 2 (two) times daily.   3  . vitamin C (ASCORBIC ACID) 500 MG tablet Take 500 mg by mouth daily.     No current facility-administered medications for this visit.      Past  Medical History:  Diagnosis Date  . Aortic stenosis    a. 2006 s/p bioprosthetic AVR; b. 12/2018 TEE: EF 60-65%, Nl AoV fxn w/o dehiscence, regurgitation, perivalvular leak, or abscess.  . Bacteremia due to Enterococcus 09/2018   a. ? discitis vs SBE; b. 12/2018 TEE: No evidence of vegetation, nl fxn'ing bioprosthetic AoV.  . Cancer ([place])   . Carotid arterial disease ([place])    a. 10/2018 Carotid U/S: 1-39% bilat ICA dzs.  . Coronary artery disease    a. 1987 s/p CABG x 5; 05/2014 MV: EF 70%, no significant ischemia.  . Discitis of lumbar region     a. 10/2018 MRI L2-3 acute discitis osteomyelitis.  . Endocarditis    a. 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  [name] History of prostatectomy   . Hyperlipidemia   . Hypertension   . Lumbar spinal stenosis    a. 10/2018 MRI: mild L2-3 and mod L3-4 spinal canal stenosis.  [name] PAD (peripheral artery disease) ([place]) 06/06/2017  . TIA (transient ischemic attack)    a. 10/2018  . Vertebral artery occlusion, left    a. 10/2018 CTA head/neck: Occlusion of prox V4 segment of the nondominant L vertebral artery - unknown chronicity.    Past Surgical History:  Procedure Laterality Date  . CARDIAC CATHETERIZATION     EF of 66%  . CARDIAC VALVE REPLACEMENT  2006  . CORONARY ARTERY BYPASS GRAFT  1987   x5  . I&D EXTREMITY Left 01/10/2015   Procedure: DEBRIDEMENT ANKLE PLACEMENT ANTIBIOTIC BEADS AND WOUND VAC;  Surgeon: [name], MD;  Location: [place];  Service: Orthopedics;  Laterality: Left;  . IR LUMBAR [place] W/IMG GUIDE  11/10/2018  . TEE WITHOUT CARDIOVERSION N/A 12/18/2018   Procedure: TRANSESOPHAGEAL ECHOCARDIOGRAM (TEE);  Surgeon: Jerline Pain, MD;  Location: [facility] ENDOSCOPY;  Service: Cardiovascular;  Laterality: N/A;    Social History   Socioeconomic History  . Marital status: Married    Spouse name: Not on file  . Number of children: Not on file  . Years of education: Not on file  . Highest education level: Not on file  Occupational History  . Not on file  Social Needs  . Financial resource strain: Not on file  . Food insecurity    Worry: Not on file    Inability: Not on file  . Transportation needs    Medical: Not on file    Non-medical: Not on file  Tobacco Use  . Smoking status: Former Smoker    Quit date: 11/18/1955    Years since quitting: 63.3  . Smokeless tobacco: Never Used  Substance and Sexual Activity  . Alcohol use: No  . Drug use: No  . Sexual activity: Not on file  Lifestyle  .  Physical activity    Days per week: Not on file    Minutes per session: Not on file  . Stress: Not on file  Relationships  . Social Herbalist on phone: Not on file    Gets together: Not on file    Attends religious service: Not on file    Active member of club or organization: Not on file    Attends meetings of clubs or organizations: Not on file    Relationship status: Not on file  . Intimate partner violence    Fear of current or ex partner: Not on file    Emotionally abused: Not on file  Physically abused: Not on file    Forced sexual activity: Not on file  Other Topics Concern  . Not on file  Social History Narrative  . Not on file    Family History  Problem Relation Age of Onset  . Heart disease Father   . Heart failure Mother   . Heart attack Brother   . Congestive Heart Failure Sister   . Congestive Heart Failure Sister     ROS: Some residual back pain but improved but no fevers or chills, productive cough, hemoptysis, dysphasia, odynophagia, melena, hematochezia, dysuria, hematuria, rash, seizure activity, orthopnea, PND, pedal edema, claudication. Remaining systems are negative.  Physical Exam: Well-developed well-nourished in no acute distress.  Skin is warm and dry.  HEENT is normal.  Neck is supple.  Chest is clear to auscultation with normal expansion.  Cardiovascular exam is regular rate and rhythm.  2/6 systolic murmur, no diastolic murmur. Abdominal exam nontender or distended. No masses palpated. Extremities show trace edema. 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  Transesophageal echocardiogram showed stable prosthetic aortic valve with no vegetations.  2 coronary artery disease status post coronary artery bypass and graft-recent nuclear study showed no ischemia.  Continue medical therapy with statin.  No aspirin given need for anticoagulation.  3  hyperlipidemia-continue statin.  4 hypertension-blood pressure controlled on he needs a follow-up follow-up noncontrast chest CT in November to follow-up nodule.  Continue present medications.  5 peripheral vascular disease-continue aspirin and statin.  6 lung nodule-patient will need follow-up noncontrast chest CT November 2020.  7 paroxysmal atrial fibrillation-there was a question of atrial fibrillation during recent hospitalization.  I do not have all strips available to review but a monitor was ordered to further assess.  I will continue apixaban and metoprolol for now.  Check renal function.  Apixaban dose may need to be lowered.  We will await monitor.  Note he did have TIAs prior to admission and I may therefore be hesitant to discontinue apixaban.  Kirk Ruths, MD

## 2019-03-13 ENCOUNTER — Other Ambulatory Visit: Payer: Self-pay

## 2019-03-13 ENCOUNTER — Encounter: Payer: Self-pay | Admitting: Cardiology

## 2019-03-13 ENCOUNTER — Ambulatory Visit: Payer: Medicare Other | Admitting: Cardiology

## 2019-03-13 VITALS — BP 126/58 | HR 62 | Temp 97.5°F | Ht 66.5 in | Wt 174.0 lb

## 2019-03-13 DIAGNOSIS — R911 Solitary pulmonary nodule: Secondary | ICD-10-CM | POA: Diagnosis not present

## 2019-03-13 DIAGNOSIS — I25810 Atherosclerosis of coronary artery bypass graft(s) without angina pectoris: Secondary | ICD-10-CM | POA: Diagnosis not present

## 2019-03-13 DIAGNOSIS — I480 Paroxysmal atrial fibrillation: Secondary | ICD-10-CM | POA: Diagnosis not present

## 2019-03-13 DIAGNOSIS — I10 Essential (primary) hypertension: Secondary | ICD-10-CM

## 2019-03-13 DIAGNOSIS — Z952 Presence of prosthetic heart valve: Secondary | ICD-10-CM

## 2019-03-13 DIAGNOSIS — R918 Other nonspecific abnormal finding of lung field: Secondary | ICD-10-CM

## 2019-03-13 LAB — BASIC METABOLIC PANEL
BUN/Creatinine Ratio: 21 (ref 10–24)
BUN: 36 mg/dL — ABNORMAL HIGH (ref 8–27)
CO2: 24 mmol/L (ref 20–29)
Calcium: 9.4 mg/dL (ref 8.6–10.2)
Chloride: 103 mmol/L (ref 96–106)
Creatinine, Ser: 1.75 mg/dL — ABNORMAL HIGH (ref 0.76–1.27)
GFR calc Af Amer: 39 mL/min/{1.73_m2} — ABNORMAL LOW (ref >59)
GFR calc non Af Amer: 34 mL/min/{1.73_m2} — ABNORMAL LOW (ref >59)
Glucose: 105 mg/dL — ABNORMAL HIGH (ref 65–99)
Potassium: 4.9 mmol/L (ref 3.5–5.2)
Sodium: 139 mmol/L (ref 134–144)

## 2019-03-13 MED ORDER — NITROGLYCERIN 0.4 MG SL SUBL: 0.4000 mg | SUBLINGUAL_TABLET | SUBLINGUAL | 11 refills | Status: DC | PRN

## 2019-03-13 MED ORDER — LOPERAMIDE HCL 2 MG PO CAPS: 2.0000 mg | ORAL_CAPSULE | Freq: Four times a day (QID) | ORAL | 0 refills | Status: DC | PRN

## 2019-03-13 NOTE — Patient Instructions (Addendum)
Medication Instructions:  STOP ASPIRIN  If you need a refill on your cardiac medications before your next appointment, please call your pharmacy.   Lab work: Your physician recommends that you HAVE LAB WORK TODAY  If you have labs (blood work) drawn today and your tests are completely normal, you will receive your results only by: [name] MyChart Message (if you have MyChart) OR . A paper copy in the mail If you have any lab test that is abnormal or we need to change your treatment, we will call you to review the results.  Testing/Procedures: CT WITHOUT CONTRAST TO FOLLOW UP ON LUNG NODULE AT THE [place] [facility]ICE-SCHEDULE IN NOVEMBER  Follow-Up: At [facility], you and your health needs are our priority.  As part of our continuing mission to provide you with exceptional heart care, we have created designated Provider Care Teams.  These Care Teams include your primary Cardiologist (physician) and Advanced Practice Providers (APPs -  Physician Assistants and Nurse Practitioners) who all work together to provide you with the care you need, when you need it. Your physician wants you to follow-up in: [place] will receive a reminder letter in the mail two months in advance. If you don't receive a letter, please call our office to schedule the follow-up appointment.

## 2019-03-14 ENCOUNTER — Telehealth: Payer: Self-pay | Admitting: *Deleted

## 2019-03-14 DIAGNOSIS — I480 Paroxysmal atrial fibrillation: Secondary | ICD-10-CM

## 2019-03-14 MED ORDER — APIXABAN 2.5 MG PO TABS: 2.5000 mg | ORAL_TABLET | Freq: Two times a day (BID) | ORAL | 3 refills | Status: DC

## 2019-03-14 NOTE — Telephone Encounter (Addendum)
-----   Message from Lelon Perla, MD sent at 03/13/2019  4:45 PM EDT ----- Change apixaban to 2.5 BID; increase po fluid intake; bmet 2 weeks [place] with pt, Aware of dr Jacalyn Lefevre recommendations. New script sent to the pharmacy and he will cut his current supply of 5 mg tablets in 1/2 until supply is gone. Lab orders mailed to the pt

## 2019-03-24 ENCOUNTER — Other Ambulatory Visit: Payer: Self-pay

## 2019-03-25 ENCOUNTER — Encounter: Payer: Self-pay | Admitting: *Deleted

## 2019-04-09 DIAGNOSIS — I480 Paroxysmal atrial fibrillation: Secondary | ICD-10-CM | POA: Diagnosis not present

## 2019-04-10 ENCOUNTER — Encounter: Payer: Self-pay | Admitting: *Deleted

## 2019-04-10 LAB — BASIC METABOLIC PANEL
BUN/Creatinine Ratio: 22 (ref 10–24)
BUN: 35 mg/dL — ABNORMAL HIGH (ref 8–27)
CO2: 23 mmol/L (ref 20–29)
Calcium: 9.4 mg/dL (ref 8.6–10.2)
Chloride: 103 mmol/L (ref 96–106)
Creatinine, Ser: 1.56 mg/dL — ABNORMAL HIGH (ref 0.76–1.27)
GFR calc Af Amer: 45 mL/min/{1.73_m2} — ABNORMAL LOW (ref >59)
GFR calc non Af Amer: 39 mL/min/{1.73_m2} — ABNORMAL LOW (ref >59)
Glucose: 95 mg/dL (ref 65–99)
Potassium: 5 mmol/L (ref 3.5–5.2)
Sodium: 139 mmol/L (ref 134–144)

## 2019-04-11 ENCOUNTER — Encounter: Payer: Self-pay | Admitting: *Deleted

## 2019-04-11 ENCOUNTER — Telehealth: Payer: Self-pay | Admitting: *Deleted

## 2019-04-11 NOTE — Telephone Encounter (Addendum)
Spoke with pt, Aware of dr Jacalyn Lefevre recommendations. He will cut the 5 mg tablets in 1/2 twice daily.  ----- Message from Lelon Perla, MD sent at 04/11/2019 12:56 PM EDT ----- Change apixaban to 2.5 mg bid [place]

## 2019-04-11 NOTE — Telephone Encounter (Signed)
This encounter was created in error - please disregard.

## 2019-05-05 ENCOUNTER — Other Ambulatory Visit: Payer: Self-pay

## 2019-05-05 ENCOUNTER — Ambulatory Visit: Payer: Medicare Other | Admitting: Infectious Disease

## 2019-05-05 ENCOUNTER — Encounter: Payer: Self-pay | Admitting: Infectious Disease

## 2019-05-05 VITALS — BP 151/74 | HR 57 | Wt 180.0 lb

## 2019-05-05 DIAGNOSIS — Z8679 Personal history of other diseases of the circulatory system: Secondary | ICD-10-CM

## 2019-05-05 DIAGNOSIS — M4620 Osteomyelitis of vertebra, site unspecified: Secondary | ICD-10-CM

## 2019-05-05 DIAGNOSIS — Z952 Presence of prosthetic heart valve: Secondary | ICD-10-CM | POA: Diagnosis not present

## 2019-05-05 NOTE — Progress Notes (Signed)
Subjective:   Chief complaint    Patient ID: Randy Chen, male    DOB: Nov 03, 1927, 85 y.o.   MRN: [id number]  HPI  Randy Chen is a 85 y.o. male history of a prosthetic aortic valve who was admitted with ampicillin sensitive enterococcal bacteremia and severe low back pain.  He had a transthoracic echocardiogram while inpatient which did not show evidence of endocarditis. We did not pursue a transesophageal echocardiogram at that time instead pursued empiric treatment for bacterial endocarditis.  Note when he initially presented back in April he did have severe back pain that was being blamed on a urinary tract infection.  At the time I was highly skeptical that the amount of pain he had was due to urinary tract infection and we imaged his back with an MRI of his thoracic and lumbosacral area.  We found no pathology on imaging.  That being said I still had a high suspicion that he might have infection at a microscopic level that we could not seen with imaging.  Because of this and because we at that time had not excluded endocarditis of his prosthetic valve we had opted for 6-week course of dual beta-lactam therapy with ampicillin and ceftriaxone.  Instead he also did have a transesophageal echocardiogram that did not show evidence of vegetations  When I had seen the patient in clinic and gone back over his microbiology I was disconcerted to find that he had had repeatedly positive blood cultures with enterococcus prior to placement of his PICC line.  I saw him in clinic in follow-up and we had planned on completing a course of therapy.  However in the interim he had helped severe spasms of lower back pain precipitated when he had to go up to the bathroom at night.  He also was having pubic pain as well.  He was evaluated at [facility] and had a CT scan that did not show any evidence of intra-abdominal pathology but showed evidence of discitis and  vertebral osteomyelitis.  He has been admitted to [facility] and undergone MRI which showed 1. L2-3 acute discitis osteomyelitis. Erosive changes of opposing endplates and mild anterior loss of height of L2 vertebral body. 2. Anterior epidural inflammation from L2-L4 with soft tissue thickening, no discrete abscess at this time. Enhancement within dorsal L4 vertebral body likely represents developing osteomyelitis from epidural spread. 3. Small right psoas muscle abscess. Extensive inflammation of bilateral psoas muscles and lumbar paraspinal muscles likely representing a combination of myositis and muscle strain. 4. Mild L2-3 and moderate L3-4 spinal canal stenosis due to thickening of the anterior epidural soft tissues combined with lumbar spondylosis. 5. Stable background of lumbar spondylosis greatest at the L4-5 and L5-S1 levels  Radiology  aspirated the disc space and sent for culture.  He was continued on dual beta-lactam therapy  He was originally told to complete his therapy for empiric treatment for prosthetic valve endocarditis t  I saw him as an inpatient at [facility].  I thought that his ceftriaxone has been discontinued since we have achieved 6 weeks of antimicrobial therapy targeted at endocarditis but ultimately it seems that he was discharged on both ampicillin and ceftriaxone.  My partner Dr. Megan Salon had planned on giving him a total of 10 weeks of therapy for 6 weeks he had before +4 additional weeks.  When I saw him in June 2020 he was having worsening back pain versus how he was feeling a few  days and weeks prior  This is disconcerting given the findings found on MRI recently.  Therefore I extended  his ampicillin for an additional month and obtained an MRI of his lumbosacral spine.  MRI was done at [facility] facility on July 2 and compared to an April scan at [facility] that did not show facet pathology, it is unfortunate this was not compared  to a May scan which did show the known discitis and osteomyelitis including a psoas muscle abscess.  It was difficult for me to interpret the findings of this MRI since is not compared to the one in May.   IMPRESSION: 1. L2-L3 discitis-osteomyelitis with myositis of the adjacent psoas muscles. Severe spinal canal stenosis and bilateral neural foraminal stenosis secondary to bulging of the disc space. 2. Ventral epidural enhancement at the L2-4 levels and dorsal epidural enhancement at the L3-4 level may indicate phlegmon/developing epidural abscess. It is possible that the ventral epidural abnormality is due to engorgement of the lumbar venous plexus. 3. Moderate bilateral L4 neural foraminal stenosis.  The patient himself felt  better.  H He was able to sleep if he takes an opioid pain medication at night.  Pain is certainly not worse than a month ago but still continues.  He did not have any lower extremity weakness whatsoever and as mentioned is actually improving in strength  I changed him to 2 months additionally of  oral amoxicillin after he completed his ampicillin.  I last saw him in September and we discontinued antibiotics and he has been doing well since then still having some back pain at times but all sounds like he has been challenging himself with some floor yoga exercises.  No fevers chills nausea or other systemic symptoms.    Past Medical History:  Diagnosis Date  . Aortic stenosis    a. 2006 s/p bioprosthetic AVR; b. 12/2018 TEE: EF 60-65%, Nl AoV fxn w/o dehiscence, regurgitation, perivalvular leak, or abscess.  . Bacteremia due to Enterococcus 09/2018   a. ? discitis vs SBE; b. 12/2018 TEE: No evidence of vegetation, nl fxn'ing bioprosthetic AoV.  . Cancer ([place])   . Carotid arterial disease ([place])    a. 10/2018 Carotid U/S: 1-39% bilat ICA dzs.  . Coronary artery disease    a. 1987 s/p CABG x 5; 05/2014 MV: EF 70%, no significant ischemia.  . Discitis of  lumbar region    a. 10/2018 MRI L2-3 acute discitis osteomyelitis.  . Endocarditis    a. 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  [name] History of prostatectomy   . Hyperlipidemia   . Hypertension   . Lumbar spinal stenosis    a. 10/2018 MRI: mild L2-3 and mod L3-4 spinal canal stenosis.  [name] PAD (peripheral artery disease) ([place]) 06/06/2017  . TIA (transient ischemic attack)    a. 10/2018  . Vertebral artery occlusion, left    a. 10/2018 CTA head/neck: Occlusion of prox V4 segment of the nondominant L vertebral artery - unknown chronicity.    Past Surgical History:  Procedure Laterality Date  . CARDIAC CATHETERIZATION     EF of 66%  . CARDIAC VALVE REPLACEMENT  2006  . CORONARY ARTERY BYPASS GRAFT  1987   x5  . I&D EXTREMITY Left 01/10/2015   Procedure: DEBRIDEMENT ANKLE PLACEMENT ANTIBIOTIC BEADS AND WOUND VAC;  Surgeon: [name], MD;  Location: [place];  Service: Orthopedics;  Laterality: Left;  . IR LUMBAR DISC ASPIRATION W/IMG  GUIDE  11/10/2018  . TEE WITHOUT CARDIOVERSION N/A 12/18/2018   Procedure: TRANSESOPHAGEAL ECHOCARDIOGRAM (TEE);  Surgeon: Jerline Pain, MD;  Location: [facility] ENDOSCOPY;  Service: Cardiovascular;  Laterality: N/A;    Family History  Problem Relation Age of Onset  . Heart disease Father   . Heart failure Mother   . Heart attack Brother   . Congestive Heart Failure Sister   . Congestive Heart Failure Sister       Social History   Socioeconomic History  . Marital status: Married    Spouse name: Not on file  . Number of children: Not on file  . Years of education: Not on file  . Highest education level: Not on file  Occupational History  . Not on file  Social Needs  . Financial resource strain: Not on file  . Food insecurity    Worry: Not on file    Inability: Not on file  . Transportation needs    Medical: Not on file    Non-medical: Not on file  Tobacco Use  . Smoking  status: Former Smoker    Quit date: 11/18/1955    Years since quitting: 63.5  . Smokeless tobacco: Never Used  Substance and Sexual Activity  . Alcohol use: No  . Drug use: No  . Sexual activity: Not on file  Lifestyle  . Physical activity    Days per week: Not on file    Minutes per session: Not on file  . Stress: Not on file  Relationships  . Social Herbalist on phone: Not on file    Gets together: Not on file    Attends religious service: Not on file    Active member of club or organization: Not on file    Attends meetings of clubs or organizations: Not on file    Relationship status: Not on file  Other Topics Concern  . Not on file  Social History Narrative  . Not on file    No Known Allergies   Current Outpatient Medications:  .  acetaminophen (TYLENOL) 500 MG tablet, Take 1,000 mg by mouth at bedtime. , Disp: , Rfl:  .  amLODipine (NORVASC) 5 MG tablet, Take 5 mg by mouth daily., Disp: , Rfl:  .  apixaban (ELIQUIS) 2.5 MG TABS tablet, Take 1 tablet (2.5 mg total) by mouth 2 (two) times daily., Disp: 180 tablet, Rfl: 3 .  atorvastatin (LIPITOR) 40 MG tablet, Take 40 mg by mouth daily., Disp: , Rfl:  .  Calcium Carbonate (CALCIUM 600 PO), Take 600 mg by mouth daily., Disp: , Rfl:  .  Cholecalciferol (VITAMIN D-3 PO), Take 1 capsule by mouth daily., Disp: , Rfl:  .  folic acid (FOLVITE) A999333 MCG tablet, Take 400 mcg by mouth 2 (two) times a week. , Disp: , Rfl:  .  Glucosamine HCl (GLUCOSAMINE PO), Take 1 tablet by mouth daily., Disp: , Rfl:  .  HYDROcodone-acetaminophen (NORCO/VICODIN) 5-325 MG tablet, Take 1-2 tablets by mouth every 4 (four) hours as needed for moderate pain. (Patient taking differently: Take 1 tablet by mouth as needed for moderate pain. ), Disp: 30 tablet, Rfl: 0 .  lisinopril (ZESTRIL) 20 MG tablet, Take 1 tablet (20 mg total) by mouth daily., Disp: 90 tablet, Rfl: 0 .  loperamide (IMODIUM) 2 MG capsule, Take 1 capsule (2 mg total) by mouth 4  (four) times daily as needed for diarrhea or loose stools., Disp: 30 capsule, Rfl: 0 .  metoprolol succinate (  TOPROL-XL) 100 MG 24 hr tablet, Take 1 tablet (100 mg total) by mouth daily. Take with or immediately following a meal., Disp: 90 tablet, Rfl: 1 .  Multiple Vitamin (THERAGRAN PO), Take 1 tablet by mouth daily., Disp: , Rfl:  .  nitroGLYCERIN (NITROSTAT) 0.4 MG SL tablet, Place 1 tablet (0.4 mg total) under the tongue every 5 (five) minutes as needed for chest pain., Disp: 25 tablet, Rfl: 11 .  timolol (TIMOPTIC) 0.5 % ophthalmic solution, Place 1 drop into the right eye 2 (two) times daily. , Disp: , Rfl: 3 .  vitamin C (ASCORBIC ACID) 500 MG tablet, Take 500 mg by mouth daily., Disp: , Rfl:     Review of Systems  Constitutional: Negative for activity change, appetite change, chills, diaphoresis, fatigue, fever and unexpected weight change.  HENT: Negative for congestion, rhinorrhea, sinus pressure, sneezing, sore throat and trouble swallowing.   Eyes: Negative for photophobia and visual disturbance.  Respiratory: Negative for cough, chest tightness, shortness of breath, wheezing and stridor.   Cardiovascular: Negative for chest pain, palpitations and leg swelling.  Gastrointestinal: Negative for abdominal distention, abdominal pain, anal bleeding, blood in stool, constipation, diarrhea, nausea and vomiting.  Genitourinary: Negative for difficulty urinating, dysuria, flank pain and hematuria.  Musculoskeletal: Positive for arthralgias and back pain. Negative for gait problem, joint swelling and myalgias.  Skin: Negative for color change, pallor, rash and wound.  Neurological: Positive for numbness. Negative for dizziness, tremors, weakness and light-headedness.  Hematological: Negative for adenopathy. Does not bruise/bleed easily.  Psychiatric/Behavioral: Negative for agitation, behavioral problems, confusion, decreased concentration and dysphoric mood.       Objective:   Physical  Exam Constitutional:      Appearance: He is well-developed.  HENT:     Head: Normocephalic and atraumatic.  Eyes:     Extraocular Movements: Extraocular movements intact.     Conjunctiva/sclera: Conjunctivae normal.  Neck:     Musculoskeletal: Normal range of motion and neck supple.  Cardiovascular:     Rate and Rhythm: Normal rate and regular rhythm.     Heart sounds: No murmur. No friction rub. No gallop.   Pulmonary:     Effort: Pulmonary effort is normal. No respiratory distress.     Breath sounds: No wheezing.  Abdominal:     General: There is no distension.     Palpations: Abdomen is soft.  Musculoskeletal: Normal range of motion.        General: No tenderness.  Skin:    General: Skin is warm and dry.     Coloration: Skin is not pale.     Findings: No erythema or rash.  Neurological:     General: No focal deficit present.     Mental Status: He is alert and oriented to person, place, and time.     Motor: Motor function is intact. No tremor, atrophy or abnormal muscle tone.  Psychiatric:        Mood and Affect: Mood normal.        Behavior: Behavior normal.        Thought Content: Thought content normal.        Judgment: Judgment normal.    PICC line is clean dry and intact       Assessment & Plan:   Ampicillin sensitive enterococcal bacteremia status post 6 weeks of IV antibiotics targeted at possible endocarditis given the presence of a prosthetic valve and the fact that he did not ever have a transesophageal echocardiogram during his initial  admission.  He did have severe back pain which I suspected was due to discitis or vertebral osteomyelitis.  However initial MRI imaging did not show this.  He subsequently is been found to have vertebral osteomyelitis and discitis with potential beginnings of an epidural infection.  He was on a protracted course of ampicillin and ceftriaxone combined, I then changed him to ampicillin alone and then augmentin  Recheck  inflammatory markers today and provided they are reassuring continue to observe him off antibiotics and see him back in 4 months time

## 2019-05-06 LAB — BASIC METABOLIC PANEL WITH GFR
BUN/Creatinine Ratio: 18 (calc) (ref 6–22)
BUN: 31 mg/dL — ABNORMAL HIGH (ref 7–25)
CO2: 27 mmol/L (ref 20–32)
Calcium: 9.7 mg/dL (ref 8.6–10.3)
Chloride: 104 mmol/L (ref 98–110)
Creat: 1.77 mg/dL — ABNORMAL HIGH (ref 0.70–1.11)
GFR, Est African American: 39 mL/min/{1.73_m2} — ABNORMAL LOW (ref >=60)
GFR, Est Non African American: 33 mL/min/{1.73_m2} — ABNORMAL LOW (ref >=60)
Glucose, Bld: 102 mg/dL — ABNORMAL HIGH (ref 65–99)
Potassium: 5.3 mmol/L (ref 3.5–5.3)
Sodium: 138 mmol/L (ref 135–146)

## 2019-05-06 LAB — CBC WITH DIFFERENTIAL/PLATELET
Absolute Monocytes: 675 cells/uL (ref 200–950)
Basophils Absolute: 38 cells/uL (ref 0–200)
Basophils Relative: 0.5 %
Eosinophils Absolute: 120 cells/uL (ref 15–500)
Eosinophils Relative: 1.6 %
HCT: 34.9 % — ABNORMAL LOW (ref 38.5–50.0)
Hemoglobin: 11.6 g/dL — ABNORMAL LOW (ref 13.2–17.1)
Lymphs Abs: 2543 cells/uL (ref 850–3900)
MCH: 31.1 pg (ref 27.0–33.0)
MCHC: 33.2 g/dL (ref 32.0–36.0)
MCV: 93.6 fL (ref 80.0–100.0)
MPV: 11 fL (ref 7.5–12.5)
Monocytes Relative: 9 %
Neutro Abs: 4125 cells/uL (ref 1500–7800)
Neutrophils Relative %: 55 %
Platelets: 186 10*3/uL (ref 140–400)
RBC: 3.73 10*6/uL — ABNORMAL LOW (ref 4.20–5.80)
RDW: 12.9 % (ref 11.0–15.0)
Total Lymphocyte: 33.9 %
WBC: 7.5 10*3/uL (ref 3.8–10.8)

## 2019-05-06 LAB — SEDIMENTATION RATE: Sed Rate: 11 mm/h (ref 0–20)

## 2019-05-06 LAB — C-REACTIVE PROTEIN: CRP: 0.4 mg/L (ref <8.0)

## 2019-05-08 ENCOUNTER — Telehealth: Payer: Self-pay

## 2019-05-08 NOTE — Telephone Encounter (Signed)
Randy Chen, [name], MD  P [facility] Triage Nurse Pool    ESR and CRP comging down but his renal fxn is worse again. Can he check in with PCP re this   Attempted to call patient regarding las results. Patient needs to follow up with PCP.

## 2019-05-12 ENCOUNTER — Other Ambulatory Visit: Payer: Self-pay

## 2019-05-12 MED ORDER — AMLODIPINE BESYLATE 5 MG PO TABS: 5.0000 mg | ORAL_TABLET | Freq: Every day | ORAL | 2 refills | Status: DC

## 2019-05-12 MED ORDER — AMLODIPINE BESYLATE 5 MG PO TABS: 5.0000 mg | ORAL_TABLET | Freq: Every day | ORAL | 1 refills | Status: DC

## 2019-05-12 NOTE — Telephone Encounter (Signed)
Attempted to contact patient, left voicemail. Also sent patient a MyChart message regarding renal function and to follow up with PCP Eugenia Mcalpine

## 2019-05-12 NOTE — Telephone Encounter (Signed)
Randy Chen

## 2019-05-20 ENCOUNTER — Encounter: Payer: Self-pay | Admitting: *Deleted

## 2019-06-10 ENCOUNTER — Other Ambulatory Visit: Payer: Self-pay

## 2019-06-10 MED ORDER — AMLODIPINE BESYLATE 5 MG PO TABS: 5.0000 mg | ORAL_TABLET | Freq: Every day | ORAL | 2 refills | Status: DC

## 2019-06-23 ENCOUNTER — Other Ambulatory Visit: Payer: Self-pay

## 2019-06-23 MED ORDER — AMLODIPINE BESYLATE 5 MG PO TABS: 5.0000 mg | ORAL_TABLET | Freq: Every day | ORAL | 2 refills | Status: DC

## 2019-07-21 DIAGNOSIS — H401111 Primary open-angle glaucoma, right eye, mild stage: Secondary | ICD-10-CM | POA: Diagnosis not present

## 2019-07-21 DIAGNOSIS — Z961 Presence of intraocular lens: Secondary | ICD-10-CM | POA: Diagnosis not present

## 2019-09-03 ENCOUNTER — Ambulatory Visit: Payer: Medicare Other | Admitting: Infectious Disease

## 2019-09-08 ENCOUNTER — Other Ambulatory Visit: Payer: Self-pay

## 2019-09-08 ENCOUNTER — Ambulatory Visit: Payer: Medicare Other | Admitting: Infectious Disease

## 2019-09-08 DIAGNOSIS — Z952 Presence of prosthetic heart valve: Secondary | ICD-10-CM | POA: Diagnosis not present

## 2019-09-08 DIAGNOSIS — M4620 Osteomyelitis of vertebra, site unspecified: Secondary | ICD-10-CM | POA: Diagnosis not present

## 2019-09-08 NOTE — Progress Notes (Signed)
Subjective:   Chief complaint follow-up for discitis and prior endocarditis   Patient ID: Randy Chen, male    DOB: 02-22-1928, 86 y.o.   MRN: [id number]  HPI  Randy Chen is a 86 y.o. male history of a prosthetic aortic valve who was admitted with ampicillin sensitive enterococcal bacteremia and severe low back pain.  He had a transthoracic echocardiogram while inpatient which did not show evidence of endocarditis. We did not pursue a transesophageal echocardiogram at that time instead pursued empiric treatment for bacterial endocarditis.  Note when he initially presented back in April he did have severe back pain that was being blamed on a urinary tract infection.  At the time I was highly skeptical that the amount of pain he had was due to urinary tract infection and we imaged his back with an MRI of his thoracic and lumbosacral area.  We found no pathology on imaging.  That being I did still have a  high suspicion that he might have infection at a microscopic level that we could not seen with imaging.  Because of this and because we at that time had not excluded endocarditis of his prosthetic valve we had opted for 6-week course of dual beta-lactam therapy with ampicillin and ceftriaxone.  Instead he also did have a transesophageal echocardiogram that did not show evidence of vegetations  When I had seen the patient in clinic and gone back over his microbiology I was disconcerted to find that he had had repeatedly positive blood cultures with enterococcus prior to placement of his PICC line.  I saw him in clinic in follow-up and we had planned on completing a course of therapy.  However in the interim he had helped severe spasms of lower back pain precipitated when he had to go up to the bathroom at night.  He also was having pubic pain as well.  He was evaluated at [facility] and had a CT scan that did not show any evidence of intra-abdominal  pathology but showed evidence of discitis and vertebral osteomyelitis.  He has been admitted to [facility] and undergone MRI which showed 1. L2-3 acute discitis osteomyelitis. Erosive changes of opposing endplates and mild anterior loss of height of L2 vertebral body. 2. Anterior epidural inflammation from L2-L4 with soft tissue thickening, no discrete abscess at this time. Enhancement within dorsal L4 vertebral body likely represents developing osteomyelitis from epidural spread. 3. Small right psoas muscle abscess. Extensive inflammation of bilateral psoas muscles and lumbar paraspinal muscles likely representing a combination of myositis and muscle strain. 4. Mild L2-3 and moderate L3-4 spinal canal stenosis due to thickening of the anterior epidural soft tissues combined with lumbar spondylosis. 5. Stable background of lumbar spondylosis greatest at the L4-5 and L5-S1 levels  Radiology  aspirated the disc space and sent for culture.  He was continued on dual beta-lactam therapy  He was originally told to complete his therapy for empiric treatment for prosthetic valve endocarditis t  I saw him as an inpatient at [facility].  I thought that his ceftriaxone has been discontinued since we have achieved 6 weeks of antimicrobial therapy targeted at endocarditis but ultimately it seems that he was discharged on both ampicillin and ceftriaxone.  My partner Dr. Megan Salon had planned on giving him a total of 10 weeks of therapy for 6 weeks he had before +4 additional weeks.  When I saw him in June 2020 he was having worsening back pain versus  how he was feeling a few days and weeks prior  This is disconcerting given the findings found on MRI recently.  Therefore I extended  his ampicillin for an additional month and obtained an MRI of his lumbosacral spine.  MRI was done at [facility] facility on July 2 and compared to an April scan at [facility] that did not show facet  pathology, it is unfortunate this was not compared to a May scan which did show the known discitis and osteomyelitis including a psoas muscle abscess.  It was difficult for me to interpret the findings of this MRI since is not compared to the one in May.   IMPRESSION: 1. L2-L3 discitis-osteomyelitis with myositis of the adjacent psoas muscles. Severe spinal canal stenosis and bilateral neural foraminal stenosis secondary to bulging of the disc space. 2. Ventral epidural enhancement at the L2-4 levels and dorsal epidural enhancement at the L3-4 level may indicate phlegmon/developing epidural abscess. It is possible that the ventral epidural abnormality is due to engorgement of the lumbar venous plexus. 3. Moderate bilateral L4 neural foraminal stenosis.  The patient himself felt  better.  H He was able to sleep if he takes an opioid pain medication at night.  Pain is certainly not worse than a month ago but still continues.  He did not have any lower extremity weakness whatsoever and as mentioned is actually improving in strength  I changed him to 2 months additionally of  oral amoxicillin after he completed his ampicillin.  I last saw him in September and we discontinued antibiotics and he has been doing well since then still having some back pain at times but all sounds like he has been challenging himself with some floor yoga exercises.  N  He returns to clinic now 4 months later.  His wife mentions that the patient is having pain across his back episodically and is difficult particular for him to sleep at night.  Does not sound like it is worse but given that they bring it up to me I think warrants further investigation.   Past Medical History:  Diagnosis Date  . Aortic stenosis    a. 2006 s/p bioprosthetic AVR; b. 12/2018 TEE: EF 60-65%, Nl AoV fxn w/o dehiscence, regurgitation, perivalvular leak, or abscess.  . Bacteremia due to Enterococcus 09/2018   a. ? discitis vs SBE; b.  12/2018 TEE: No evidence of vegetation, nl fxn'ing bioprosthetic AoV.  . Cancer ([place])   . Carotid arterial disease ([place])    a. 10/2018 Carotid U/S: 1-39% bilat ICA dzs.  . Coronary artery disease    a. 1987 s/p CABG x 5; 05/2014 MV: EF 70%, no significant ischemia.  . Discitis of lumbar region    a. 10/2018 MRI L2-3 acute discitis osteomyelitis.  . Endocarditis    a. 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  [name] History of prostatectomy   . Hyperlipidemia   . Hypertension   . Lumbar spinal stenosis    a. 10/2018 MRI: mild L2-3 and mod L3-4 spinal canal stenosis.  [name] PAD (peripheral artery disease) ([place]) 06/06/2017  . TIA (transient ischemic attack)    a. 10/2018  . Vertebral artery occlusion, left    a. 10/2018 CTA head/neck: Occlusion of prox V4 segment of the nondominant L vertebral artery - unknown chronicity.    Past Surgical History:  Procedure Laterality Date  . CARDIAC CATHETERIZATION     EF of 66%  . CARDIAC VALVE REPLACEMENT  2006  . CORONARY ARTERY BYPASS GRAFT  1987   x5  . I & D EXTREMITY Left 01/10/2015   Procedure: DEBRIDEMENT ANKLE PLACEMENT ANTIBIOTIC BEADS AND WOUND VAC;  Surgeon: [name], MD;  Location: [place];  Service: Orthopedics;  Laterality: Left;  . IR LUMBAR [place] W/IMG GUIDE  11/10/2018  . TEE WITHOUT CARDIOVERSION N/A 12/18/2018   Procedure: TRANSESOPHAGEAL ECHOCARDIOGRAM (TEE);  Surgeon: Jerline Pain, MD;  Location: [facility] ENDOSCOPY;  Service: Cardiovascular;  Laterality: N/A;    Family History  Problem Relation Age of Onset  . Heart disease Father   . Heart failure Mother   . Heart attack Brother   . Congestive Heart Failure Sister   . Congestive Heart Failure Sister       Social History   Socioeconomic History  . Marital status: Married    Spouse name: Not on file  . Number of children: Not on file  . Years of education: Not on file  . Highest education level: Not  on file  Occupational History  . Not on file  Tobacco Use  . Smoking status: Former Smoker    Quit date: 11/18/1955    Years since quitting: 63.8  . Smokeless tobacco: Never Used  Substance and Sexual Activity  . Alcohol use: No  . Drug use: No  . Sexual activity: Not on file  Other Topics Concern  . Not on file  Social History Narrative  . Not on file   Social Determinants of Health   Financial Resource Strain:   . Difficulty of Paying Living Expenses:   Food Insecurity:   . Worried About Charity fundraiser in the Last Year:   . Arboriculturist in the Last Year:   Transportation Needs:   . Film/video editor (Medical):   [name] Lack of Transportation (Non-Medical):   Physical Activity:   . Days of Exercise per Week:   . Minutes of Exercise per Session:   Stress:   . Feeling of Stress :   Social Connections:   . Frequency of Communication with Friends and Family:   . Frequency of Social Gatherings with Friends and Family:   . Attends Religious Services:   . Active Member of Clubs or Organizations:   . Attends Archivist Meetings:   [name] Marital Status:     No Known Allergies   Current Outpatient Medications:  .  acetaminophen (TYLENOL) 500 MG tablet, Take 1,000 mg by mouth at bedtime. , Disp: , Rfl:  .  amLODipine (NORVASC) 5 MG tablet, Take 1 tablet (5 mg total) by mouth daily., Disp: 90 tablet, Rfl: 2 .  apixaban (ELIQUIS) 2.5 MG TABS tablet, Take 1 tablet (2.5 mg total) by mouth 2 (two) times daily., Disp: 180 tablet, Rfl: 3 .  atorvastatin (LIPITOR) 40 MG tablet, Take 40 mg by mouth daily., Disp: , Rfl:  .  Calcium Carbonate (CALCIUM 600 PO), Take 600 mg by mouth daily., Disp: , Rfl:  .  Cholecalciferol (VITAMIN D-3 PO), Take 1 capsule by mouth daily., Disp: , Rfl:  .  folic acid (FOLVITE) A999333 MCG tablet, Take 400 mcg by mouth 2 (two) times a week. , Disp: , Rfl:  .  Glucosamine HCl (GLUCOSAMINE PO), Take 1 tablet by mouth daily., Disp: , Rfl:  .   HYDROcodone-acetaminophen (NORCO/VICODIN) 5-325 MG tablet, Take 1-2 tablets by mouth every 4 (four) hours as needed for moderate pain. (Patient taking differently: Take 1 tablet by mouth as  needed for moderate pain. ), Disp: 30 tablet, Rfl: 0 .  lisinopril (ZESTRIL) 20 MG tablet, Take 1 tablet (20 mg total) by mouth daily., Disp: 90 tablet, Rfl: 0 .  loperamide (IMODIUM) 2 MG capsule, Take 1 capsule (2 mg total) by mouth 4 (four) times daily as needed for diarrhea or loose stools., Disp: 30 capsule, Rfl: 0 .  metoprolol succinate (TOPROL-XL) 100 MG 24 hr tablet, Take 1 tablet (100 mg total) by mouth daily. Take with or immediately following a meal., Disp: 90 tablet, Rfl: 1 .  Multiple Vitamin (THERAGRAN PO), Take 1 tablet by mouth daily., Disp: , Rfl:  .  nitroGLYCERIN (NITROSTAT) 0.4 MG SL tablet, Place 1 tablet (0.4 mg total) under the tongue every 5 (five) minutes as needed for chest pain., Disp: 25 tablet, Rfl: 11 .  timolol (TIMOPTIC) 0.5 % ophthalmic solution, Place 1 drop into the right eye 2 (two) times daily. , Disp: , Rfl: 3 .  vitamin C (ASCORBIC ACID) 500 MG tablet, Take 500 mg by mouth daily., Disp: , Rfl:     Review of Systems  Constitutional: Negative for activity change, appetite change, chills, diaphoresis, fatigue, fever and unexpected weight change.  HENT: Negative for congestion, rhinorrhea, sinus pressure, sneezing, sore throat and trouble swallowing.   Eyes: Negative for photophobia and visual disturbance.  Respiratory: Negative for cough, chest tightness, shortness of breath, wheezing and stridor.   Cardiovascular: Negative for chest pain, palpitations and leg swelling.  Gastrointestinal: Positive for diarrhea. Negative for abdominal distention, abdominal pain, anal bleeding, blood in stool, constipation, nausea and vomiting.  Genitourinary: Negative for difficulty urinating, dysuria, flank pain and hematuria.  Musculoskeletal: Positive for back pain. Negative for gait  problem, joint swelling and myalgias.  Skin: Negative for color change, pallor, rash and wound.  Neurological: Positive for numbness. Negative for dizziness, tremors, weakness and light-headedness.  Hematological: Negative for adenopathy. Does not bruise/bleed easily.  Psychiatric/Behavioral: Negative for agitation, behavioral problems, confusion, decreased concentration and dysphoric mood.       Objective:   Physical Exam Constitutional:      Appearance: He is well-developed.  HENT:     Head: Normocephalic and atraumatic.  Eyes:     Extraocular Movements: Extraocular movements intact.     Conjunctiva/sclera: Conjunctivae normal.  Cardiovascular:     Rate and Rhythm: Normal rate and regular rhythm.     Heart sounds: No murmur. No friction rub. No gallop.   Pulmonary:     Effort: Pulmonary effort is normal. No respiratory distress.     Breath sounds: No wheezing.  Abdominal:     General: There is no distension.     Palpations: Abdomen is soft.  Musculoskeletal:        General: No tenderness. Normal range of motion.     Cervical back: Normal range of motion and neck supple.  Skin:    General: Skin is warm and dry.     Coloration: Skin is not pale.     Findings: No erythema or rash.  Neurological:     General: No focal deficit present.     Mental Status: He is alert and oriented to person, place, and time.     Motor: Motor function is intact. No tremor, atrophy or abnormal muscle tone.  Psychiatric:        Mood and Affect: Mood normal.        Behavior: Behavior normal.        Thought Content: Thought content normal.  Judgment: Judgment normal.    PICC line is clean dry and intact       Assessment & Plan:   Ampicillin sensitive enterococcal bacteremia status post 6 weeks of IV antibiotics targeted at possible endocarditis given the presence of a prosthetic valve and the fact that he did not ever have a transesophageal echocardiogram during his initial admission.   He did have severe back pain which I suspected was due to discitis or vertebral osteomyelitis.  However initial MRI imaging did not show this.  He subsequently is been found to have vertebral osteomyelitis and discitis with potential beginnings of an epidural infection.  He was on a protracted course of ampicillin and ceftriaxone combined, I then changed him to ampicillin alone and then augmentin  Has now been off antibiotics and back pain has not worsened.  The fact though that there bring up the back pain me to me today makes me a bit apprehensive that potentially infection could be back again we will check inflammatory markers and I am ordering an MRI of his lumbar spine

## 2019-09-09 LAB — BASIC METABOLIC PANEL WITH GFR
BUN/Creatinine Ratio: 19 (calc) (ref 6–22)
BUN: 36 mg/dL — ABNORMAL HIGH (ref 7–25)
CO2: 23 mmol/L (ref 20–32)
Calcium: 9.3 mg/dL (ref 8.6–10.3)
Chloride: 107 mmol/L (ref 98–110)
Creat: 1.85 mg/dL — ABNORMAL HIGH (ref 0.70–1.11)
GFR, Est African American: 37 mL/min/{1.73_m2} — ABNORMAL LOW (ref >=60)
GFR, Est Non African American: 32 mL/min/{1.73_m2} — ABNORMAL LOW (ref >=60)
Glucose, Bld: 88 mg/dL (ref 65–99)
Potassium: 5 mmol/L (ref 3.5–5.3)
Sodium: 139 mmol/L (ref 135–146)

## 2019-09-09 LAB — SEDIMENTATION RATE: Sed Rate: 17 mm/h (ref 0–20)

## 2019-09-09 LAB — C-REACTIVE PROTEIN: CRP: 1.3 mg/L (ref <8.0)

## 2019-09-10 NOTE — Progress Notes (Signed)
HPI: Follow-up coronary artery disease and prior aortic valve replacement. Patient is status post coronary artery bypass and graft in 1987 and had aortic valve replacement with a bioprosthetic valve in 2006. Patient has known peripheral vascular disease and was evaluated by Dr. Gwenlyn Found and medical therapy recommended. Had enterococcal bacteremia April 2020. Has been treated with antibiotics. 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 Had discitis May 2020. Also noted to have right lower lobe nodule and follow-up recommended 3 to 6 months. Echocardiogram April 2020 showed normal LV function, moderate left atrial enlargement, bioprosthetic aortic valve with no obvious vegetation and normal mean gradient. Follow-up study May 2020 unchanged.HadTIA May 2020.Transesophageal echocardiogram July 2020 showed normal LV function, mild to moderate mitral regurgitation, bioprosthetic aortic valve with no significant vegetation noted. CTA July 2020 showed 30% stenosis on the left, occluded left vertebral.  Nuclear study July 2020 showed ejection fraction 52%, inferior defect suggestive of scar versus diaphragmatic attenuation but no ischemia. Also with question of atrial fibrillation during previous hospitalization. Monitor October 2020 showed sinus bradycardia, normal sinus rhythm, sinus tachycardia, PACs including one nonconducted and PVCs. Since last seen patient denies dyspnea, chest pain, palpitations or syncope.  No fevers.  Current Outpatient Medications  Medication Sig Dispense Refill  . acetaminophen (TYLENOL) 500 MG tablet Take 1,000 mg by mouth at bedtime.     [name] amLODipine (NORVASC) 5 MG tablet Take 1 tablet (5 mg total) by mouth daily. 90 tablet 2  . apixaban (ELIQUIS) 2.5 MG TABS tablet Take 1 tablet (2.5 mg total) by mouth 2 (two) times daily. 180 tablet 3  . atorvastatin (LIPITOR) 40 MG tablet Take 40 mg by mouth daily.    . Calcium Carbonate (CALCIUM 600 PO) Take 600 mg by mouth  daily.    . Cholecalciferol (VITAMIN D-3 PO) Take 1 capsule by mouth daily.    . folic acid (FOLVITE) A999333 MCG tablet Take 400 mcg by mouth 2 (two) times a week.     . Glucosamine HCl (GLUCOSAMINE PO) Take 1 tablet by mouth daily.    [name] HYDROcodone-acetaminophen (NORCO/VICODIN) 5-325 MG tablet Take 1-2 tablets by mouth every 4 (four) hours as needed for moderate pain. (Patient taking differently: Take 1 tablet by mouth as needed for moderate pain. ) 30 tablet 0  . lisinopril (ZESTRIL) 20 MG tablet Take 1 tablet (20 mg total) by mouth daily. 90 tablet 0  . loperamide (IMODIUM) 2 MG capsule Take 1 capsule (2 mg total) by mouth 4 (four) times daily as needed for diarrhea or loose stools. 30 capsule 0  . metoprolol succinate (TOPROL-XL) 100 MG 24 hr tablet Take 1 tablet (100 mg total) by mouth daily. Take with or immediately following a meal. 90 tablet 1  . Multiple Vitamin (THERAGRAN PO) Take 1 tablet by mouth daily.    . nitroGLYCERIN (NITROSTAT) 0.4 MG SL tablet Place 1 tablet (0.4 mg total) under the tongue every 5 (five) minutes as needed for chest pain. 25 tablet 11  . timolol (TIMOPTIC) 0.5 % ophthalmic solution Place 1 drop into the right eye 2 (two) times daily.   3  . vitamin C (ASCORBIC ACID) 500 MG tablet Take 500 mg by mouth daily.     No current facility-administered medications for this visit.     Past Medical History:  Diagnosis Date  . Aortic stenosis    a. 2006 s/p bioprosthetic AVR; b. 12/2018 TEE: EF 60-65%, Nl AoV fxn w/o dehiscence, regurgitation, perivalvular leak, or abscess.  [name]  Bacteremia due to Enterococcus 09/2018   a. ? discitis vs SBE; b. 12/2018 TEE: No evidence of vegetation, nl fxn'ing bioprosthetic AoV.  . Cancer ([place])   . Carotid arterial disease ([place])    a. 10/2018 Carotid U/S: 1-39% bilat ICA dzs.  . Coronary artery disease    a. 1987 s/p CABG x 5; 05/2014 MV: EF 70%, no significant ischemia.  . Discitis of lumbar region    a. 10/2018 MRI L2-3 acute discitis  osteomyelitis.  . Endocarditis    a. 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  [name] History of prostatectomy   . Hyperlipidemia   . Hypertension   . Lumbar spinal stenosis    a. 10/2018 MRI: mild L2-3 and mod L3-4 spinal canal stenosis.  [name] PAD (peripheral artery disease) ([place]) 06/06/2017  . TIA (transient ischemic attack)    a. 10/2018  . Vertebral artery occlusion, left    a. 10/2018 CTA head/neck: Occlusion of prox V4 segment of the nondominant L vertebral artery - unknown chronicity.    Past Surgical History:  Procedure Laterality Date  . CARDIAC CATHETERIZATION     EF of 66%  . CARDIAC VALVE REPLACEMENT  2006  . CORONARY ARTERY BYPASS GRAFT  1987   x5  . I & D EXTREMITY Left 01/10/2015   Procedure: DEBRIDEMENT ANKLE PLACEMENT ANTIBIOTIC BEADS AND WOUND VAC;  Surgeon: [name], MD;  Location: [place];  Service: Orthopedics;  Laterality: Left;  . IR LUMBAR [place] W/IMG GUIDE  11/10/2018  . TEE WITHOUT CARDIOVERSION N/A 12/18/2018   Procedure: TRANSESOPHAGEAL ECHOCARDIOGRAM (TEE);  Surgeon: Jerline Pain, MD;  Location: [facility] ENDOSCOPY;  Service: Cardiovascular;  Laterality: N/A;    Social History   Socioeconomic History  . Marital status: Married    Spouse name: Not on file  . Number of children: Not on file  . Years of education: Not on file  . Highest education level: Not on file  Occupational History  . Not on file  Tobacco Use  . Smoking status: Former Smoker    Quit date: 11/18/1955    Years since quitting: 63.8  . Smokeless tobacco: Never Used  Substance and Sexual Activity  . Alcohol use: No  . Drug use: No  . Sexual activity: Not on file  Other Topics Concern  . Not on file  Social History Narrative  . Not on file   Social Determinants of Health   Financial Resource Strain:   . Difficulty of Paying Living Expenses:   Food Insecurity:   . Worried About Charity fundraiser in the  Last Year:   . Arboriculturist in the Last Year:   Transportation Needs:   . Film/video editor (Medical):   [name] Lack of Transportation (Non-Medical):   Physical Activity:   . Days of Exercise per Week:   . Minutes of Exercise per Session:   Stress:   . Feeling of Stress :   Social Connections:   . Frequency of Communication with Friends and Family:   . Frequency of Social Gatherings with Friends and Family:   . Attends Religious Services:   . Active Member of Clubs or Organizations:   . Attends Archivist Meetings:   [name] Marital Status:   Intimate Partner Violence:   . Fear of Current or Ex-Partner:   . Emotionally Abused:   [name] Physically Abused:   . Sexually Abused:     Family History  Problem Relation Age of Onset  . Heart disease Father   . Heart failure Mother   . Heart attack Brother   . Congestive Heart Failure Sister   . Congestive Heart Failure Sister     ROS: no fevers or chills, productive cough, hemoptysis, dysphasia, odynophagia, melena, hematochezia, dysuria, hematuria, rash, seizure activity, orthopnea, PND, pedal edema, claudication. Remaining systems are negative.  Physical Exam: Well-developed well-nourished in no acute distress.  Skin is warm and dry.  HEENT is normal.  Neck is supple.  Chest is clear to auscultation with normal expansion.  Cardiovascular exam is regular rate and rhythm.  2/6 systolic murmur left sternal border.  No diastolic murmur. Abdominal exam nontender or distended. No masses palpated. Extremities show no edema. 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  He improved with antibiotics.  2 coronary artery disease status post coronary artery bypass and graft-patient doing well with no chest pain.  Continue statin.  He is not on aspirin given need for anticoagulation.  3  hypertension-blood pressure controlled.  Continue present medical regimen.  4 hyperlipidemia-continue statin.  5 lung nodule-we will arrange follow-up noncontrast chest CT.  6 peripheral vascular disease-plan to continue statin.  7 paroxysmal atrial fibrillation-this was questioned at previous hospitalization.  Strips were not available to review.  Follow-up monitor did not show atrial fibrillation.  Given that he had a previous TIA prior to admission I have been concerned about stopping his anticoagulation and we will therefore continue apixaban.  Continue beta-blocker.  Kirk Ruths, MD

## 2019-09-13 ENCOUNTER — Other Ambulatory Visit: Payer: Self-pay

## 2019-09-13 ENCOUNTER — Ambulatory Visit (HOSPITAL_BASED_OUTPATIENT_CLINIC_OR_DEPARTMENT_OTHER)
Admission: RE | Admit: 2019-09-13 | Discharge: 2019-09-13 | Disposition: A | Discharge status: Home / Self Care | Payer: Medicare Other | Source: Ambulatory Visit | Attending: Infectious Disease | Admitting: Infectious Disease

## 2019-09-13 DIAGNOSIS — M4620 Osteomyelitis of vertebra, site unspecified: Secondary | ICD-10-CM | POA: Diagnosis not present

## 2019-09-13 DIAGNOSIS — M545 Low back pain: Secondary | ICD-10-CM | POA: Diagnosis not present

## 2019-09-15 ENCOUNTER — Telehealth: Payer: Self-pay | Admitting: *Deleted

## 2019-09-15 NOTE — Telephone Encounter (Signed)
-----   Message from Truman Hayward, MD sent at 09/15/2019 11:51 AM EDT ----- His MRI is encouraging no evidence of acute infection he can continue to stay off antibiotics

## 2019-09-15 NOTE — Telephone Encounter (Signed)
Unable to reach patient. Will try again.

## 2019-09-16 NOTE — Telephone Encounter (Signed)
Relayed to patient. He is currently scheduled for follow up on 6/22, will cancel closer to that appointment if things are going well. Landis Gandy, RN

## 2019-09-17 ENCOUNTER — Ambulatory Visit: Payer: Medicare Other | Admitting: Cardiology

## 2019-09-17 ENCOUNTER — Encounter: Payer: Self-pay | Admitting: Cardiology

## 2019-09-17 ENCOUNTER — Other Ambulatory Visit: Payer: Self-pay

## 2019-09-17 VITALS — BP 150/60 | HR 58 | Ht 66.5 in | Wt 191.8 lb

## 2019-09-17 DIAGNOSIS — Z952 Presence of prosthetic heart valve: Secondary | ICD-10-CM

## 2019-09-17 DIAGNOSIS — R911 Solitary pulmonary nodule: Secondary | ICD-10-CM

## 2019-09-17 DIAGNOSIS — I480 Paroxysmal atrial fibrillation: Secondary | ICD-10-CM

## 2019-09-17 DIAGNOSIS — I25810 Atherosclerosis of coronary artery bypass graft(s) without angina pectoris: Secondary | ICD-10-CM

## 2019-09-17 NOTE — Telephone Encounter (Signed)
Ok very good thanks Michelle 

## 2019-09-17 NOTE — Patient Instructions (Signed)
Medication Instructions:  NO CHANGE *If you need a refill on your cardiac medications before your next appointment, please call your pharmacy*   Lab Work: If you have labs (blood work) drawn today and your tests are completely normal, you will receive your results only by: [name] MyChart Message (if you have MyChart) OR . A paper copy in the mail If you have any lab test that is abnormal or we need to change your treatment, we will call you to review the results.   Testing/Procedures: CT WO CONTRAST TO FOLLOW UP ON LUNG NODULE AT THE [place] LOCATION   Follow-Up: At [facility], you and your health needs are our priority.  As part of our continuing mission to provide you with exceptional heart care, we have created designated Provider Care Teams.  These Care Teams include your primary Cardiologist (physician) and Advanced Practice Providers (APPs -  Physician Assistants and Nurse Practitioners) who all work together to provide you with the care you need, when you need it.  We recommend signing up for the patient portal called "MyChart".  Sign up information is provided on this After Visit Summary.  MyChart is used to connect with patients for Virtual Visits (Telemedicine).  Patients are able to view lab/test results, encounter notes, upcoming appointments, etc.  Non-urgent messages can be sent to your provider as well.   To learn more about what you can do with MyChart, go to [email or web address].    Your next appointment:   12 month(s)  The format for your next appointment:   Either In Person or Virtual  Provider:   Kirk Ruths, MD

## 2019-10-01 ENCOUNTER — Other Ambulatory Visit (HOSPITAL_BASED_OUTPATIENT_CLINIC_OR_DEPARTMENT_OTHER): Payer: Medicare Other

## 2019-10-02 ENCOUNTER — Ambulatory Visit (HOSPITAL_BASED_OUTPATIENT_CLINIC_OR_DEPARTMENT_OTHER)
Admission: RE | Admit: 2019-10-02 | Discharge: 2019-10-02 | Disposition: A | Discharge status: Home / Self Care | Payer: Medicare Other | Source: Ambulatory Visit | Attending: Cardiology | Admitting: Cardiology

## 2019-10-02 ENCOUNTER — Other Ambulatory Visit: Payer: Self-pay | Admitting: *Deleted

## 2019-10-02 ENCOUNTER — Other Ambulatory Visit: Payer: Self-pay

## 2019-10-02 DIAGNOSIS — R911 Solitary pulmonary nodule: Secondary | ICD-10-CM | POA: Insufficient documentation

## 2019-10-06 ENCOUNTER — Other Ambulatory Visit: Payer: Self-pay | Admitting: Physician Assistant

## 2019-11-27 ENCOUNTER — Other Ambulatory Visit: Payer: Self-pay | Admitting: Cardiology

## 2019-11-27 MED ORDER — ATORVASTATIN CALCIUM 40 MG PO TABS: 40.0000 mg | ORAL_TABLET | Freq: Every day | ORAL | 3 refills | Status: DC

## 2019-11-27 NOTE — Telephone Encounter (Signed)
*  STAT* If patient is at the pharmacy, call can be transferred to refill team.   1. Which medications need to be refilled? (please list name of each medication and dose if known) atorvastatin (LIPITOR) 40 MG tablet  2. Which pharmacy/location (including street and city if local pharmacy) is medication to be sent to? [id number] - [place], [place] - [address]  3. Do they need a 30 day or 90 day supply? 90 day  Patient is requesting Dr. Stanford Breed take over refills of the medication

## 2019-12-09 ENCOUNTER — Ambulatory Visit: Payer: Medicare Other | Admitting: Infectious Disease

## 2019-12-22 ENCOUNTER — Other Ambulatory Visit: Payer: Self-pay | Admitting: Physician Assistant

## 2019-12-23 MED ORDER — APIXABAN 2.5 MG PO TABS: 2.5000 mg | ORAL_TABLET | Freq: Two times a day (BID) | ORAL | 0 refills | Status: DC

## 2019-12-23 NOTE — Telephone Encounter (Signed)
22m 87kg Scr 1.85 09/08/19 Lovw/crenshaw 09/17/19 Pt requesting 5mg  eliquis when they only qualify for 2.5mg  due to age over 73 and scr above 1.5 will route to pharmd pool for further assesment

## 2020-01-15 DIAGNOSIS — Z952 Presence of prosthetic heart valve: Secondary | ICD-10-CM | POA: Diagnosis not present

## 2020-01-15 DIAGNOSIS — I10 Essential (primary) hypertension: Secondary | ICD-10-CM | POA: Diagnosis not present

## 2020-01-15 DIAGNOSIS — Z0000 Encounter for general adult medical examination without abnormal findings: Secondary | ICD-10-CM | POA: Diagnosis not present

## 2020-01-15 DIAGNOSIS — R7301 Impaired fasting glucose: Secondary | ICD-10-CM | POA: Diagnosis not present

## 2020-01-16 ENCOUNTER — Other Ambulatory Visit (HOSPITAL_BASED_OUTPATIENT_CLINIC_OR_DEPARTMENT_OTHER): Payer: Self-pay | Admitting: Family Medicine

## 2020-01-16 DIAGNOSIS — R911 Solitary pulmonary nodule: Secondary | ICD-10-CM

## 2020-01-28 ENCOUNTER — Other Ambulatory Visit: Payer: Self-pay | Admitting: *Deleted

## 2020-01-28 MED ORDER — AMLODIPINE BESYLATE 5 MG PO TABS: 5.0000 mg | ORAL_TABLET | Freq: Every day | ORAL | 2 refills | Status: DC

## 2020-01-28 NOTE — Telephone Encounter (Signed)
Rx has been sent to the pharmacy electronically. ° °

## 2020-03-12 IMAGING — CR CHEST - 2 VIEW
2 series · 2 of 2 positions shown · non-contrast
Comparison: 11/14/2018 and earlier.

CLINICAL DATA: Chest pain and epigastric abdominal pain that began
acutely last night.

EXAM:
CHEST - 2 VIEW

[w chest pa]
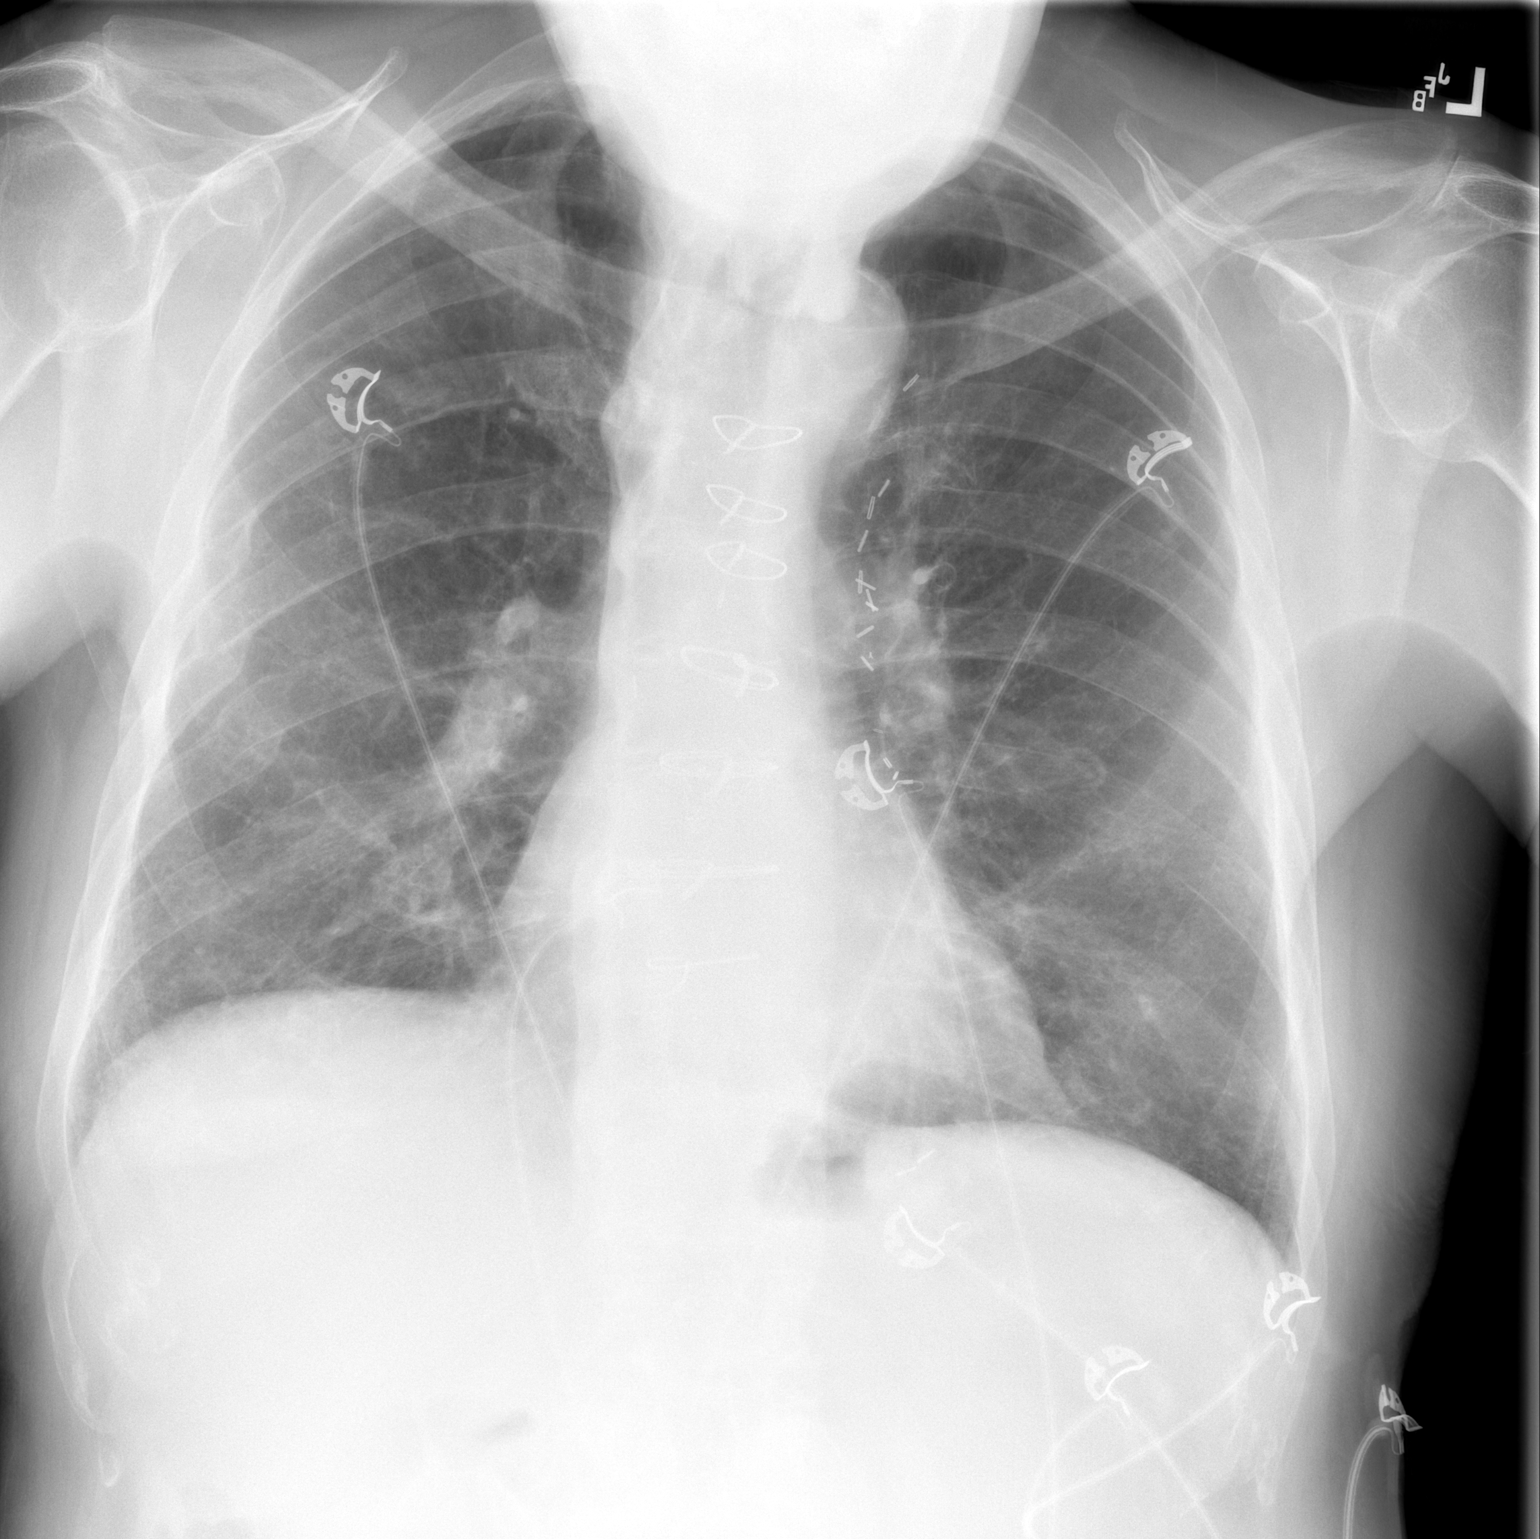

[w chest lat]
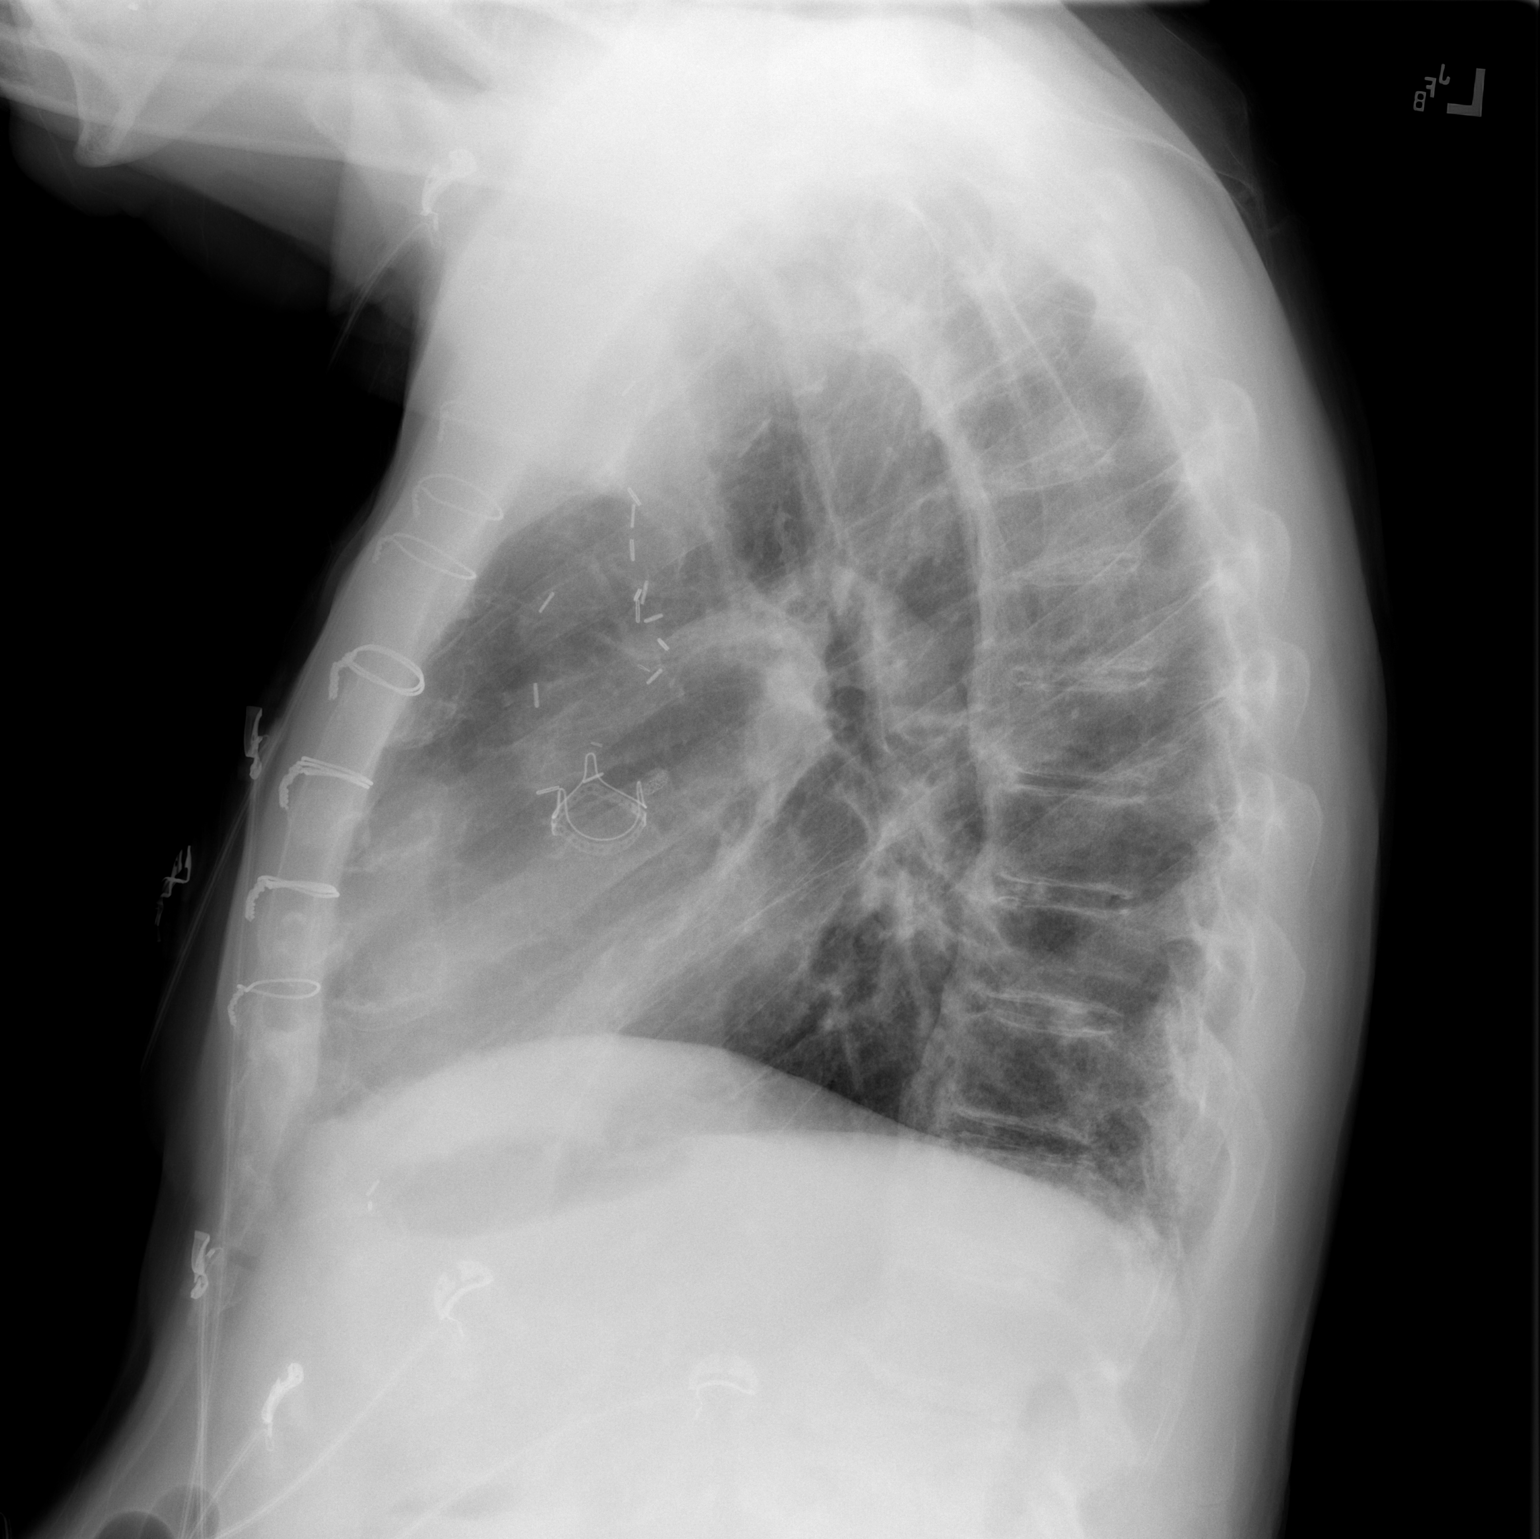

[2 of 2 positions shown; findings below may reference images not displayed]

FINDINGS: Sternotomy for CABG and aortic valve replacement. Cardiac silhouette
normal in size, unchanged. Thoracic aorta tortuous and
atherosclerotic, unchanged. Hilar and mediastinal contours otherwise
unremarkable. Lungs clear. Bronchovascular markings normal.
Pulmonary vascularity normal. No visible pleural effusions. No
pneumothorax. Degenerative changes and DISH involving the thoracic
spine. Remote healed RIGHT rib fractures.
IMPRESSION: No acute cardiopulmonary disease.

## 2020-03-13 IMAGING — MR MRI HEAD WITHOUT AND WITH CONTRAST
14 of 16 series · 42 of 48 positions shown · IV contrast (Gadavist)
Comparison: Head CT, CTA, and CT perfusion 01/05/2019 and MRI
11/14/2018

CLINICAL DATA: Transient expressive aphasia.  Endocarditis.

EXAM:
MRI HEAD WITHOUT AND WITH CONTRAST
TECHNIQUE: Multiplanar, multiecho pulse sequences of the brain and surrounding
structures were obtained without and with intravenous contrast.
CONTRAST:  7 mL Gadavist

[Series 5: DWI · axial · 3.0mm · 0.92mm/px · z∈[-40,+119]mm · 8 of 108 slices shown (1 of 4)]
[im 1/108]
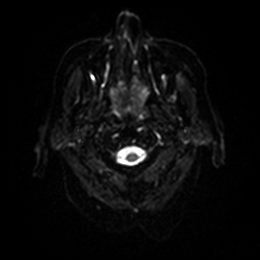
[im 16/108]
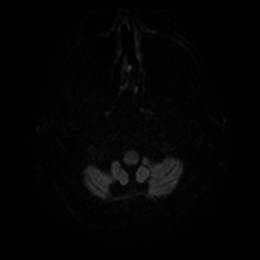
[im 31/108]
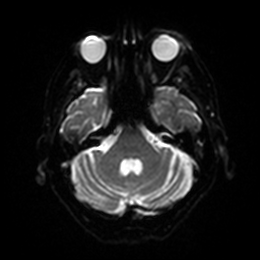
[im 46/108]
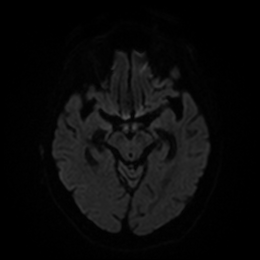
[im 62/108]
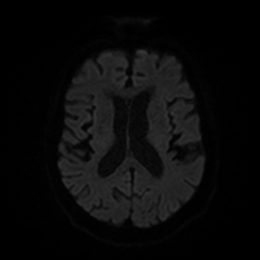
[im 77/108]
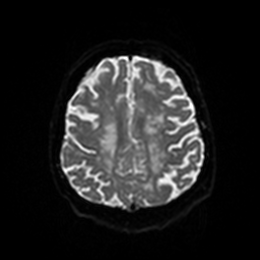
[im 92/108]
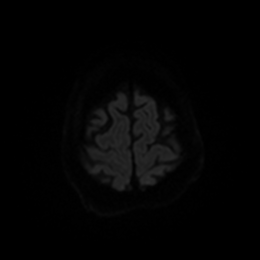
[im 108/108]
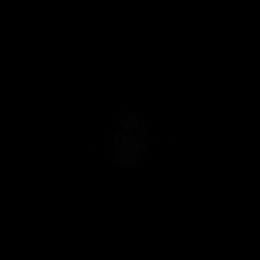

[Series 6: DWI · axial · 3.0mm · 0.92mm/px · z∈[-40,+119]mm · 3 of 54 slices shown (2 of 4)]
[im 1/54]
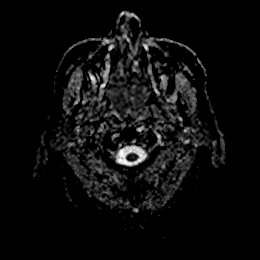
[im 27/54]
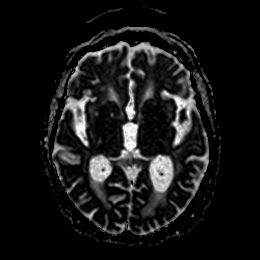
[im 54/54]
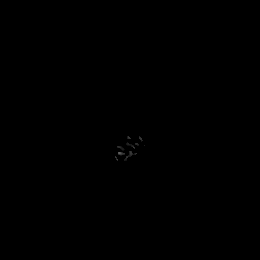

[Series 7: DWI · coronal · 4.0mm · 0.88mm/px · 5 of 78 slices shown (3 of 4)]
[im 1/78]
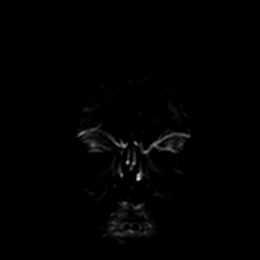
[im 20/78]
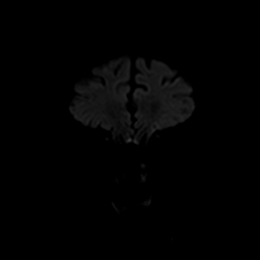
[im 39/78]
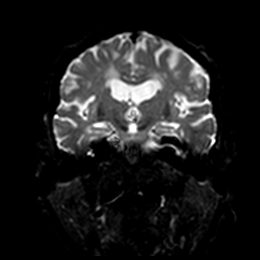
[im 58/78]
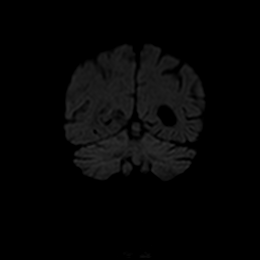
[im 78/78]
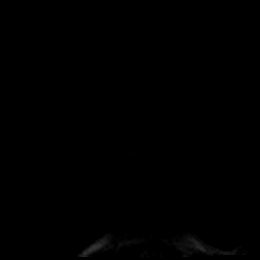

[Series 8: DWI · coronal · 4.0mm · 0.88mm/px · 2 of 39 slices shown (4 of 4)]
[im 1/39]
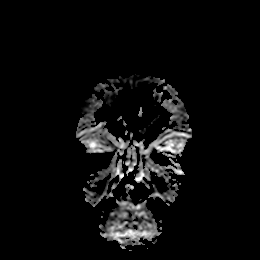
[im 39/39]
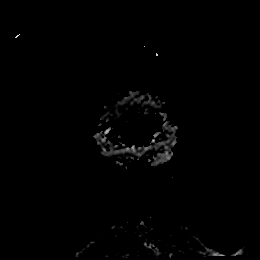

[Series 9: FLAIR · axial · 5.0mm · 0.47mm/px · z∈[-35,+121]mm · 2 of 27 slices shown]
[im 1/27]
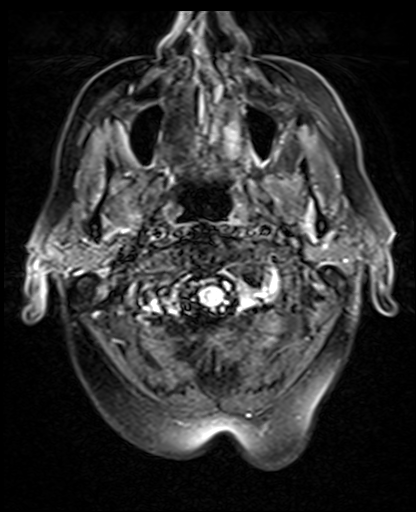
[im 27/27]
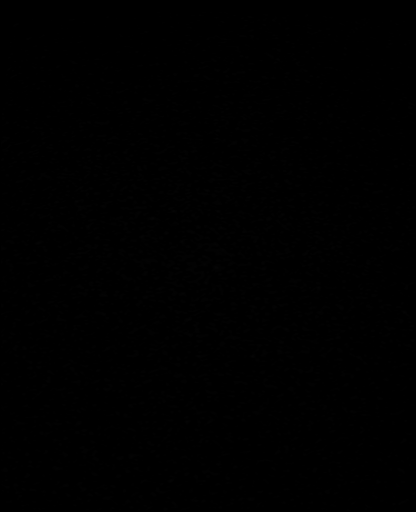

[Series 10: mag_images · axial · 3.0mm · 0.94mm/px · z∈[-34,+119]mm · 3 of 52 slices shown]
[im 1/52]
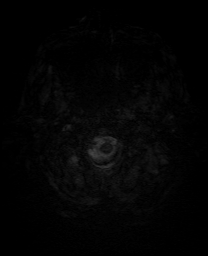
[im 26/52]
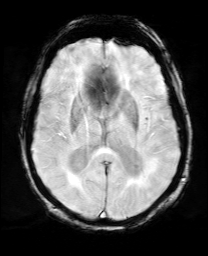
[im 52/52]
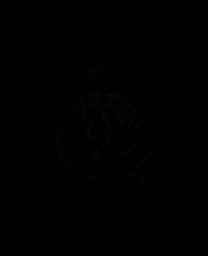

[Series 11: pha_images · axial · 3.0mm · 0.94mm/px · z∈[-34,+116]mm · 3 of 51 slices shown]
[im 1/51]
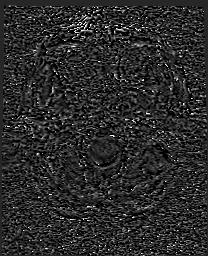
[im 26/51]
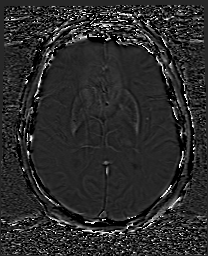
[im 51/51]
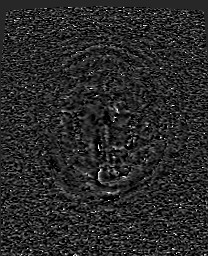

[Series 12: swi_images · axial · 3.0mm · 0.94mm/px · z∈[-34,+119]mm · 3 of 52 slices shown]
[im 1/52]
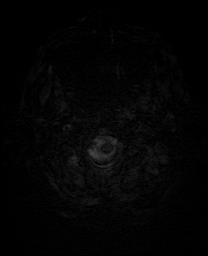
[im 26/52]
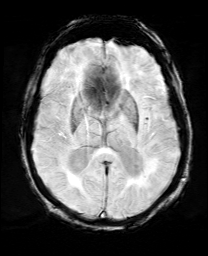
[im 52/52]
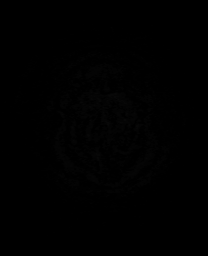

[Series 13: mip_images(sw) · axial · 24.0mm · 0.94mm/px · z∈[-23,+109]mm · 3 of 45 slices shown]
[im 1/45]
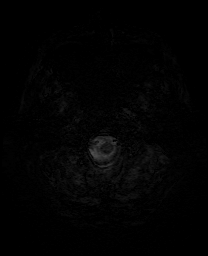
[im 23/45]
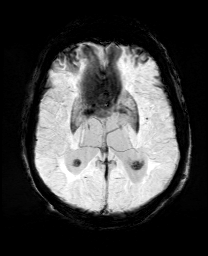
[im 45/45]
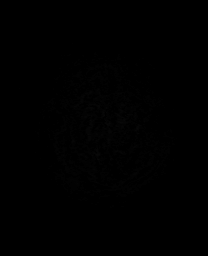

[Series 15: T2 · axial · 5.0mm · 0.75mm/px · z∈[-34,+122]mm · 2 of 27 slices shown]
[im 1/27]
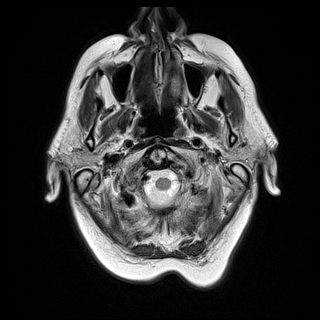
[im 27/27]
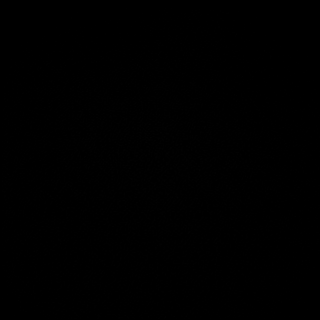

[Series 17: T1 · sagittal · 5.0mm · 0.75mm/px · 2 of 27 slices shown]
[im 1/27]
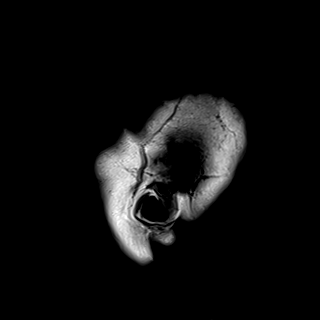
[im 27/27]
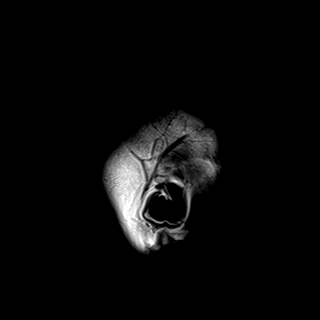

[Series 18: T2 post-contrast · coronal · 5.0mm · 0.72mm/px · 2 of 33 slices shown]
[im 1/33]
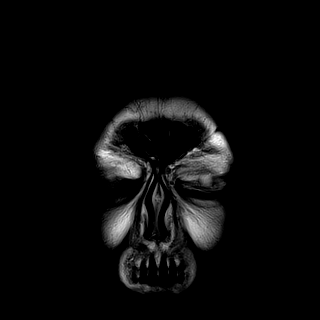
[im 33/33]
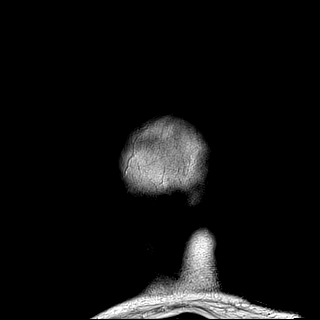

[Series 20: T1 post-contrast · coronal · 5.0mm · 0.43mm/px · 2 of 33 slices shown (1 of 2)]
[im 1/33]
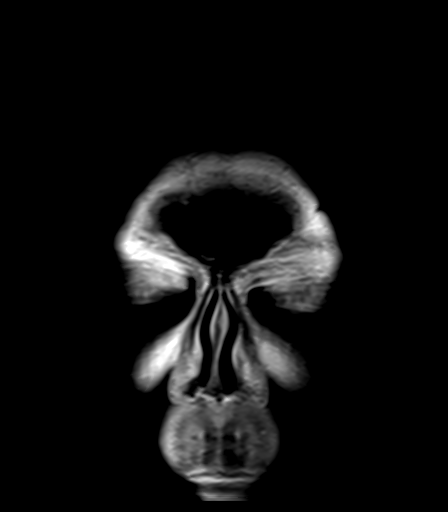
[im 33/33]
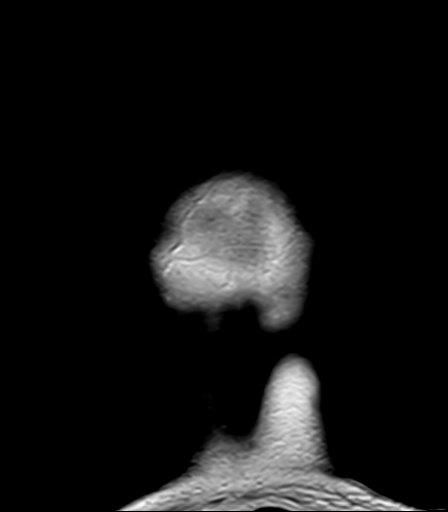

[Series 21: T1 post-contrast · sagittal · 5.0mm · 0.75mm/px · 2 of 27 slices shown (2 of 2)]
[im 1/27]
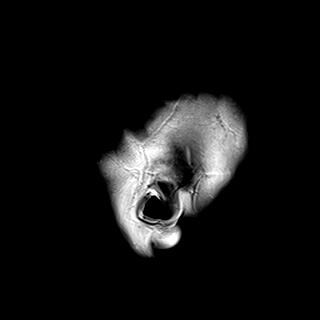
[im 27/27]
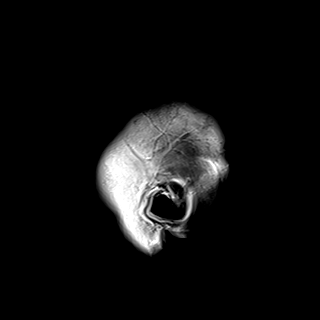

[42 of 48 positions shown; findings below may reference images not displayed]

FINDINGS: Brain: There is no evidence of acute infarct, mass, midline shift,
or extra-axial fluid collection. A few scattered chronic cerebral
microhemorrhages are again seen. Patchy and confluent T2
hyperintensities in the cerebral white matter bilaterally are
unchanged from the prior MRI and nonspecific but compatible with
moderate to severe chronic small vessel ischemic disease. A small
chronic right cerebellar infarct is unchanged. Post-contrast images
are mildly motion degraded without abnormal intracranial enhancement
identified. There is moderate cerebral atrophy.

Vascular: Major intracranial vascular flow voids are preserved.

Skull and upper cervical spine: Unremarkable bone marrow signal.

Sinuses/Orbits: Bilateral cataract extraction. Paranasal sinuses and
mastoid air cells are clear.

Other: None.
IMPRESSION: 1. No acute intracranial abnormality.
2. Moderate to severe chronic small vessel ischemic disease.

## 2020-04-08 ENCOUNTER — Other Ambulatory Visit: Payer: Self-pay

## 2020-04-08 MED ORDER — APIXABAN 2.5 MG PO TABS: 2.5000 mg | ORAL_TABLET | Freq: Two times a day (BID) | ORAL | 0 refills | Status: DC

## 2020-04-28 ENCOUNTER — Other Ambulatory Visit: Payer: Self-pay

## 2020-04-28 ENCOUNTER — Other Ambulatory Visit (HOSPITAL_BASED_OUTPATIENT_CLINIC_OR_DEPARTMENT_OTHER): Payer: Medicare Other

## 2020-04-28 ENCOUNTER — Ambulatory Visit (HOSPITAL_BASED_OUTPATIENT_CLINIC_OR_DEPARTMENT_OTHER)
Admission: RE | Admit: 2020-04-28 | Discharge: 2020-04-28 | Disposition: A | Discharge status: Home / Self Care | Payer: Medicare Other | Source: Ambulatory Visit | Attending: Family Medicine | Admitting: Family Medicine

## 2020-04-28 DIAGNOSIS — R911 Solitary pulmonary nodule: Secondary | ICD-10-CM | POA: Diagnosis not present

## 2020-04-28 DIAGNOSIS — I700 Atherosclerosis of aorta: Secondary | ICD-10-CM | POA: Diagnosis not present

## 2020-04-28 DIAGNOSIS — J8410 Pulmonary fibrosis, unspecified: Secondary | ICD-10-CM | POA: Diagnosis not present

## 2020-04-28 DIAGNOSIS — K449 Diaphragmatic hernia without obstruction or gangrene: Secondary | ICD-10-CM | POA: Diagnosis not present

## 2020-04-28 DIAGNOSIS — I712 Thoracic aortic aneurysm, without rupture: Secondary | ICD-10-CM | POA: Diagnosis not present

## 2020-04-29 ENCOUNTER — Other Ambulatory Visit: Payer: Self-pay | Admitting: *Deleted

## 2020-04-29 DIAGNOSIS — R918 Other nonspecific abnormal finding of lung field: Secondary | ICD-10-CM

## 2020-04-29 NOTE — Progress Notes (Signed)
amb  

## 2020-05-05 ENCOUNTER — Encounter: Payer: Self-pay | Admitting: Emergency Medicine

## 2020-05-05 ENCOUNTER — Ambulatory Visit: Payer: Medicare Other | Admitting: Emergency Medicine

## 2020-05-05 ENCOUNTER — Other Ambulatory Visit: Payer: Self-pay

## 2020-05-05 DIAGNOSIS — R911 Solitary pulmonary nodule: Secondary | ICD-10-CM

## 2020-05-05 DIAGNOSIS — Z0120 Encounter for dental examination and cleaning without abnormal findings: Secondary | ICD-10-CM | POA: Diagnosis not present

## 2020-05-05 NOTE — Patient Instructions (Signed)
We will plan to repeat your CT scan of the chest in 6 months to follow your pulmonary nodule for stability. Depending on the results we will discuss next steps which may include biopsy or review for possible radiation treatment. Follow Dr. Lamonte Sakai in 6 months after the CT so that we can review.

## 2020-05-05 NOTE — Assessment & Plan Note (Signed)
He has a solid right lower lobe pulmonary nodule is almost certainly 9 based on its stability over serial films going back to 2015.  The newly identified groundglass right upper lobe pulmonary nodule has changed on most recent scan, slightly larger with a more solid component.  Suspect that this is an adenocarcinoma.  I reviewed this with him today.  Based on his age we have elected to follow conservatively for now.  If it is growing or changing on his next CT in 6 months then we will talk about the pros and cons of bronchoscopy, talk about the possibility of discussing with radiation oncology for empiric XRT if our suspicion for an adenocarcinoma is high.  He will call me if he develops any respiratory symptoms in the interim.  We will plan to repeat your CT scan of the chest in 6 months to follow your pulmonary nodule for stability. Depending on the results we will discuss next steps which may include biopsy or review for possible radiation treatment. Follow Dr. Lamonte Sakai in 6 months after the CT so that we can review.

## 2020-05-05 NOTE — Progress Notes (Signed)
Subjective:    Patient ID: Randy Chen, male    DOB: [birth year], 86 y.o.   MRN: [id number]  HPI 86 year old gentleman, former smoker (10 pack years) with a history of AS/AVR (question prior endocarditis), coronary disease/CABG, cerebrovascular disease, hypertension, PVD, lumbar disc disease, prostate CA s/p radiation. Recent bacteremia.   He is feeling well. Good functional capacity, walks w a cane.   He is referred today by Dr. Stanford Breed with cardiology for abnormal CT scan of the chest.  He has a right lower lobe pulmonary nodule 1.1 x 1.1 cm that has been stable going back to 2014, benign.  CT chest 10/02/19 identified a small groundglass right upper lobe nodule 1.7 cm.  A repeat CT chest 04/29/2020 reviewed by me, shows a slight increase in size in his right upper lobe groundglass nodule now 2.6 x 2.2 cm with a slight increase in solid component.   Review of Systems As per HPI  Past Medical History:  Diagnosis Date  . Aortic stenosis    a. 2006 s/p bioprosthetic AVR; b. 12/2018 TEE: EF 60-65%, Nl AoV fxn w/o dehiscence, regurgitation, perivalvular leak, or abscess.  . Bacteremia due to Enterococcus 09/2018   a. ? discitis vs SBE; b. 12/2018 TEE: No evidence of vegetation, nl fxn'ing bioprosthetic AoV.  . Cancer ([place])   . Carotid arterial disease ([place])    a. 10/2018 Carotid U/S: 1-39% bilat ICA dzs.  . Coronary artery disease    a. 1987 s/p CABG x 5; 05/2014 MV: EF 70%, no significant ischemia.  . Discitis of lumbar region    a. 10/2018 MRI L2-3 acute discitis osteomyelitis.  . Endocarditis    a. 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  [name] History of prostatectomy   . Hyperlipidemia   . Hypertension   . Lumbar spinal stenosis    a. 10/2018 MRI: mild L2-3 and mod L3-4 spinal canal stenosis.  [name] PAD (peripheral artery disease) ([place]) 06/06/2017  . TIA (transient ischemic attack)    a. 10/2018  . Vertebral  artery occlusion, left    a. 10/2018 CTA head/neck: Occlusion of prox V4 segment of the nondominant L vertebral artery - unknown chronicity.     Family History  Problem Relation Age of Onset  . Heart disease Father   . Heart failure Mother   . Heart attack Brother   . Congestive Heart Failure Sister   . Congestive Heart Failure Sister      Social History   Socioeconomic History  . Marital status: Married    Spouse name: Not on file  . Number of children: Not on file  . Years of education: Not on file  . Highest education level: Not on file  Occupational History  . Not on file  Tobacco Use  . Smoking status: Former Smoker    Quit date: 11/18/1955    Years since quitting: 64.5  . Smokeless tobacco: Never Used  Substance and Sexual Activity  . Alcohol use: No  . Drug use: No  . Sexual activity: Not on file  Other Topics Concern  . Not on file  Social History Narrative  . Not on file   Social Determinants of Health   Financial Resource Strain:   . Difficulty of Paying Living Expenses: Not on file  Food Insecurity:   . Worried About Charity fundraiser in the Last Year: Not on file  . Ran Out of Food in the Last Year: Not  on file  Transportation Needs:   . Lack of Transportation (Medical): Not on file  . Lack of Transportation (Non-Medical): Not on file  Physical Activity:   . Days of Exercise per Week: Not on file  . Minutes of Exercise per Session: Not on file  Stress:   . Feeling of Stress : Not on file  Social Connections:   . Frequency of Communication with Friends and Family: Not on file  . Frequency of Social Gatherings with Friends and Family: Not on file  . Attends Religious Services: Not on file  . Active Member of Clubs or Organizations: Not on file  . Attends Archivist Meetings: Not on file  . Marital Status: Not on file  Intimate Partner Violence:   . Fear of Current or Ex-Partner: Not on file  . Emotionally Abused: Not on file  .  Physically Abused: Not on file  . 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   No Known Allergies   Outpatient Medications Prior to Visit  Medication Sig Dispense Refill  . acetaminophen (TYLENOL) 500 MG tablet Take 1,000 mg by mouth at bedtime.     [name] amLODipine (NORVASC) 5 MG tablet Take 1 tablet (5 mg total) by mouth daily. 90 tablet 2  . apixaban (ELIQUIS) 2.5 MG TABS tablet Take 1 tablet (2.5 mg total) by mouth 2 (two) times daily. 180 tablet 0  . atorvastatin (LIPITOR) 40 MG tablet Take 1 tablet (40 mg total) by mouth daily. 90 tablet 3  . Calcium Carbonate (CALCIUM 600 PO) Take 600 mg by mouth daily.    . Cholecalciferol (VITAMIN D-3 PO) Take 1 capsule by mouth daily.    . folic acid (FOLVITE) 784 MCG tablet Take 400 mcg by mouth 2 (two) times a week.     . Glucosamine HCl (GLUCOSAMINE PO) Take 1 tablet by mouth daily.    [name] HYDROcodone-acetaminophen (NORCO/VICODIN) 5-325 MG tablet Take 1-2 tablets by mouth every 4 (four) hours as needed for moderate pain. (Patient taking differently: Take 1 tablet by mouth as needed for moderate pain. ) 30 tablet 0  . lisinopril (ZESTRIL) 20 MG tablet Take 1 tablet (20 mg total) by mouth daily. 90 tablet 0  . loperamide (IMODIUM) 2 MG capsule Take 1 capsule (2 mg total) by mouth 4 (four) times daily as needed for diarrhea or loose stools. 30 capsule 0  . metoprolol succinate (TOPROL-XL) 100 MG 24 hr tablet TAKE 1 TABLET BY MOUTH DAILY. TAKE WITH OR IMMEDIATELY FOLLOWING A MEAL. GENERIC EQUIVALENT FOR TOPROL XL. 90 tablet 3  . Multiple Vitamin (THERAGRAN PO) Take 1 tablet by mouth daily.    . nitroGLYCERIN (NITROSTAT) 0.4 MG SL tablet Place 1 tablet (0.4 mg total) under the tongue every 5 (five) minutes as needed for chest pain. 25 tablet 11  . timolol (TIMOPTIC) 0.5 % ophthalmic solution Place 1 drop into the right eye 2 (two) times daily.   3  . vitamin C (ASCORBIC ACID) 500 MG tablet Take  500 mg by mouth daily.     No facility-administered medications prior to visit.        Objective:   Physical Exam Vitals:   05/05/20 1359  BP: 140/74  Pulse: (!) 51  Temp: 98 F (36.7 C)  TempSrc: Oral  SpO2: 98%  Weight: 196 lb 9.6 oz (89.2 kg)  Height: 5' 6.5" (1.689 m)   Gen: Pleasant, well-nourished, in  no distress,  normal affect, slight kyphosis  ENT: No lesions,  mouth clear,  oropharynx clear, no postnasal drip  Neck: No JVD, no stridor  Lungs: No use of accessory muscles, no crackles or wheezing on normal respiration, no wheeze on forced expiration  Cardiovascular: RRR, heart sounds normal, no murmur or gallops, no peripheral edema  Musculoskeletal: No deformities, no cyanosis or clubbing  Neuro: alert, awake, non focal  Skin: Warm, no lesions or rash      Assessment & Plan:  Pulmonary nodule He has a solid right lower lobe pulmonary nodule is almost certainly 9 based on its stability over serial films going back to 2015.  The newly identified groundglass right upper lobe pulmonary nodule has changed on most recent scan, slightly larger with a more solid component.  Suspect that this is an adenocarcinoma.  I reviewed this with him today.  Based on his age we have elected to follow conservatively for now.  If it is growing or changing on his next CT in 6 months then we will talk about the pros and cons of bronchoscopy, talk about the possibility of discussing with radiation oncology for empiric XRT if our suspicion for an adenocarcinoma is high.  He will call me if he develops any respiratory symptoms in the interim.  We will plan to repeat your CT scan of the chest in 6 months to follow your pulmonary nodule for stability. Depending on the results we will discuss next steps which may include biopsy or review for possible radiation treatment. Follow Dr. Lamonte Sakai in 6 months after the CT so that we can review.  Baltazar Apo, MD, PhD 05/05/2020, 2:39 PM [place]  [facility] [phone] or if no answer [phone]

## 2020-05-25 ENCOUNTER — Institutional Professional Consult (permissible substitution): Payer: Medicare Other | Admitting: Pulmonary Disease

## 2020-06-30 ENCOUNTER — Other Ambulatory Visit: Payer: Self-pay

## 2020-06-30 DIAGNOSIS — R911 Solitary pulmonary nodule: Secondary | ICD-10-CM

## 2020-06-30 MED ORDER — APIXABAN 2.5 MG PO TABS: 2.5000 mg | ORAL_TABLET | Freq: Two times a day (BID) | ORAL | 1 refills | Status: DC

## 2020-06-30 NOTE — Telephone Encounter (Signed)
Prescription refill request for Eliquis received. Indication: atrial fibrillation Last office visit:3/31  crenshaw Scr:1.85  3/21 Age: 87 Weight: 89.2 kg  Prescription refilled

## 2020-07-21 DIAGNOSIS — Z961 Presence of intraocular lens: Secondary | ICD-10-CM | POA: Diagnosis not present

## 2020-07-21 DIAGNOSIS — H401111 Primary open-angle glaucoma, right eye, mild stage: Secondary | ICD-10-CM | POA: Diagnosis not present

## 2020-07-28 ENCOUNTER — Other Ambulatory Visit: Payer: Self-pay | Admitting: Physician Assistant

## 2020-07-30 ENCOUNTER — Other Ambulatory Visit: Payer: Self-pay

## 2020-07-30 MED ORDER — AMLODIPINE BESYLATE 5 MG PO TABS: 5.0000 mg | ORAL_TABLET | Freq: Every day | ORAL | 2 refills | Status: DC

## 2020-08-11 DIAGNOSIS — L821 Other seborrheic keratosis: Secondary | ICD-10-CM | POA: Diagnosis not present

## 2020-08-11 DIAGNOSIS — L578 Other skin changes due to chronic exposure to nonionizing radiation: Secondary | ICD-10-CM | POA: Diagnosis not present

## 2020-08-11 DIAGNOSIS — L570 Actinic keratosis: Secondary | ICD-10-CM | POA: Diagnosis not present

## 2020-08-11 DIAGNOSIS — L820 Inflamed seborrheic keratosis: Secondary | ICD-10-CM | POA: Diagnosis not present

## 2020-08-13 DIAGNOSIS — R194 Change in bowel habit: Secondary | ICD-10-CM | POA: Diagnosis not present

## 2020-08-13 DIAGNOSIS — R109 Unspecified abdominal pain: Secondary | ICD-10-CM | POA: Diagnosis not present

## 2020-08-27 DIAGNOSIS — R159 Full incontinence of feces: Secondary | ICD-10-CM | POA: Diagnosis not present

## 2020-08-27 DIAGNOSIS — K219 Gastro-esophageal reflux disease without esophagitis: Secondary | ICD-10-CM | POA: Diagnosis not present

## 2020-08-27 DIAGNOSIS — R195 Other fecal abnormalities: Secondary | ICD-10-CM | POA: Diagnosis not present

## 2020-09-08 NOTE — Progress Notes (Signed)
HPI: Follow-up coronary artery disease and prior aortic valve replacement. Patient is status post coronary artery bypass and graft in 1987 and had aortic valve replacement with a bioprosthetic valve in 2006. Patient has known peripheral vascular disease and was evaluated by Dr. Gwenlyn Found and medical therapy recommended. Had enterococcal bacteremia April 2020. Has been treated with antibiotics. 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 Had discitis May 2020. Also noted to have right lower lobe nodule and follow-up recommended 3 to 6 months. Echocardiogram April 2020 showed normal LV function, moderate left atrial enlargement, bioprosthetic aortic valve with no obvious vegetation and normal mean gradient. Follow-up study May 2020 unchanged.HadTIA May 2020.Transesophageal echocardiogram July 2020 showed normal LV function, mild to moderate mitral regurgitation, bioprosthetic aortic valve with no significant vegetation noted. CTA July 2020 showed 30% stenosis on the left, occluded left vertebral. Nuclear study July 2020 showed ejection fraction 52%, inferior defect suggestive of scar versus diaphragmatic attenuation but no ischemia. Also with question ofatrial fibrillation during previous hospitalization. Monitor October 2020 showed sinus bradycardia, normal sinus rhythm, sinus tachycardia, PACs including one nonconducted and PVCs. Since last seen  patient denies dyspnea, chest pain, palpitations or syncope.  Current Outpatient Medications  Medication Sig Dispense Refill  . acetaminophen (TYLENOL) 500 MG tablet Take 1,000 mg by mouth at bedtime.    [name] amLODipine (NORVASC) 5 MG tablet Take 1 tablet (5 mg total) by mouth daily. 90 tablet 2  . apixaban (ELIQUIS) 2.5 MG TABS tablet Take 1 tablet (2.5 mg total) by mouth 2 (two) times daily. 180 tablet 1  . atorvastatin (LIPITOR) 40 MG tablet Take 1 tablet (40 mg total) by mouth daily. (Patient taking differently: Take 20 mg by mouth in the  morning and at bedtime.) 90 tablet 3  . Calcium Carbonate (CALCIUM 600 PO) Take 600 mg by mouth daily.    . Cholecalciferol (VITAMIN D-3 PO) Take 1 capsule by mouth daily.    . folic acid (FOLVITE) 993 MCG tablet Take 400 mcg by mouth 2 (two) times a week.    . Glucosamine HCl (GLUCOSAMINE PO) Take 1 tablet by mouth daily.    [name] HYDROcodone-acetaminophen (NORCO/VICODIN) 5-325 MG tablet Take 1-2 tablets by mouth every 4 (four) hours as needed for moderate pain. (Patient taking differently: Take 1 tablet by mouth as needed for moderate pain.) 30 tablet 0  . lisinopril (ZESTRIL) 20 MG tablet Take 1 tablet (20 mg total) by mouth daily. 90 tablet 0  . loperamide (IMODIUM) 2 MG capsule Take 1 capsule (2 mg total) by mouth 4 (four) times daily as needed for diarrhea or loose stools. 30 capsule 0  . metoprolol succinate (TOPROL-XL) 100 MG 24 hr tablet TAKE 1 TABLET BY MOUTH DAILY. TAKE WITH OR IMMEDIATELY FOLLOWING A MEAL. GENERIC EQUIVALENT FOR TOPROL XL. 90 tablet 0  . Multiple Vitamin (THERAGRAN PO) Take 1 tablet by mouth daily.    . nitroGLYCERIN (NITROSTAT) 0.4 MG SL tablet Place 1 tablet (0.4 mg total) under the tongue every 5 (five) minutes as needed for chest pain. 25 tablet 11  . pantoprazole (PROTONIX) 40 MG tablet Take 40 mg by mouth daily.    . timolol (TIMOPTIC) 0.5 % ophthalmic solution Place 1 drop into the right eye 2 (two) times daily.   3  . vitamin C (ASCORBIC ACID) 500 MG tablet Take 500 mg by mouth daily.    . Wheat Dextrin (BENEFIBER) POWD Take by mouth as directed.     No current facility-administered  medications for this visit.     Past Medical History:  Diagnosis Date  . Aortic stenosis    a. 2006 s/p bioprosthetic AVR; b. 12/2018 TEE: EF 60-65%, Nl AoV fxn w/o dehiscence, regurgitation, perivalvular leak, or abscess.  . Bacteremia due to Enterococcus 09/2018   a. ? discitis vs SBE; b. 12/2018 TEE: No evidence of vegetation, nl fxn'ing bioprosthetic AoV.  . Cancer ([place])   .  Carotid arterial disease ([place])    a. 10/2018 Carotid U/S: 1-39% bilat ICA dzs.  . Coronary artery disease    a. 1987 s/p CABG x 5; 05/2014 MV: EF 70%, no significant ischemia.  . Discitis of lumbar region    a. 10/2018 MRI L2-3 acute discitis osteomyelitis.  . Endocarditis    a. 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  [name] History of prostatectomy   . Hyperlipidemia   . Hypertension   . Lumbar spinal stenosis    a. 10/2018 MRI: mild L2-3 and mod L3-4 spinal canal stenosis.  [name] PAD (peripheral artery disease) ([place]) 06/06/2017  . TIA (transient ischemic attack)    a. 10/2018  . Vertebral artery occlusion, left    a. 10/2018 CTA head/neck: Occlusion of prox V4 segment of the nondominant L vertebral artery - unknown chronicity.    Past Surgical History:  Procedure Laterality Date  . CARDIAC CATHETERIZATION     EF of 66%  . CARDIAC VALVE REPLACEMENT  2006  . CORONARY ARTERY BYPASS GRAFT  1987   x5  . I & D EXTREMITY Left 01/10/2015   Procedure: DEBRIDEMENT ANKLE PLACEMENT ANTIBIOTIC BEADS AND WOUND VAC;  Surgeon: [name], MD;  Location: [facility];  Service: Orthopedics;  Laterality: Left;  . IR LUMBAR [place] W/IMG GUIDE  11/10/2018  . TEE WITHOUT CARDIOVERSION N/A 12/18/2018   Procedure: TRANSESOPHAGEAL ECHOCARDIOGRAM (TEE);  Surgeon: Jerline Pain, MD;  [facility]: [facility] ENDOSCOPY;  Service: Cardiovascular;  Laterality: N/A;    Social History   Socioeconomic History  . Marital status: Married    Spouse name: Not on file  . Number of children: Not on file  . Years of education: Not on file  . Highest education level: Not on file  Occupational History  . Not on file  Tobacco Use  . Smoking status: Former Smoker    Quit date: 11/18/1955    Years since quitting: 64.8  . Smokeless tobacco: Never Used  Substance and Sexual Activity  . Alcohol use: No  . Drug use: No  . Sexual activity: Not on file  Other  Topics Concern  . Not on file  Social History Narrative  . Not on file   Social Determinants of Health   Financial Resource Strain: Not on file  Food Insecurity: Not on file  Transportation Needs: Not on file  Physical Activity: Not on file  Stress: Not on file  Social Connections: Not on file  Intimate Partner Violence: Not on file    Family History  Problem Relation Age of Onset  . Heart disease Father   . Heart failure Mother   . Heart attack Brother   . Congestive Heart Failure Sister   . Congestive Heart Failure Sister     ROS: no fevers or chills, productive cough, hemoptysis, dysphasia, odynophagia, melena, hematochezia, dysuria, hematuria, rash, seizure activity, orthopnea, PND, pedal edema, claudication. Remaining systems are negative.  Physical Exam: Well-developed well-nourished in no acute distress.  Skin is warm and dry.  HEENT  is normal.  Neck is supple.  Chest is clear to auscultation with normal expansion.  Cardiovascular exam is regular rate and rhythm.  2/6 systolic murmur left sternal border.  No diastolic murmur. Abdominal exam nontender or distended. No masses palpated. Extremities show no edema. neuro grossly intact  ECG-sinus bradycardia at a rate of 54, no ST changes.  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  2 coronary artery disease-patient denies chest pain.  Continue statin.  He is not on aspirin given need for anticoagulation.  3 question paroxysmal atrial fibrillation-previous strips were not available for review.  However given previous TIA I have elected to continue with anticoagulation.  Continue beta-blocker.  Check hemoglobin and renal function.  Note if creatinine less than 1.5 his dose of apixaban should be 5 mg twice daily.  4 hypertension-blood pressure controlled.  Continue present medications and follow.  5 hyperlipidemia-continue statin.  Check  lipids and liver.  6 lung nodule-now evaluated by pulmonary.  7 peripheral vascular disease-continue statin.  Kirk Ruths, MD

## 2020-09-15 ENCOUNTER — Ambulatory Visit: Payer: Medicare Other | Admitting: Cardiology

## 2020-09-15 ENCOUNTER — Encounter: Payer: Self-pay | Admitting: Cardiology

## 2020-09-15 ENCOUNTER — Other Ambulatory Visit: Payer: Self-pay

## 2020-09-15 VITALS — BP 138/64 | HR 57 | Ht 67.0 in | Wt 194.8 lb

## 2020-09-15 DIAGNOSIS — Z952 Presence of prosthetic heart valve: Secondary | ICD-10-CM

## 2020-09-15 DIAGNOSIS — E785 Hyperlipidemia, unspecified: Secondary | ICD-10-CM

## 2020-09-15 DIAGNOSIS — I480 Paroxysmal atrial fibrillation: Secondary | ICD-10-CM | POA: Diagnosis not present

## 2020-09-15 DIAGNOSIS — I25810 Atherosclerosis of coronary artery bypass graft(s) without angina pectoris: Secondary | ICD-10-CM

## 2020-09-15 DIAGNOSIS — I10 Essential (primary) hypertension: Secondary | ICD-10-CM

## 2020-09-15 NOTE — Patient Instructions (Signed)

## 2020-09-16 LAB — CBC
Hematocrit: 37.1 % — ABNORMAL LOW (ref 37.5–51.0)
Hemoglobin: 12.3 g/dL — ABNORMAL LOW (ref 13.0–17.7)
MCH: 30.8 pg (ref 26.6–33.0)
MCHC: 33.2 g/dL (ref 31.5–35.7)
MCV: 93 fL (ref 79–97)
Platelets: 158 10*3/uL (ref 150–450)
RBC: 3.99 x10E6/uL — ABNORMAL LOW (ref 4.14–5.80)
RDW: 12.4 % (ref 11.6–15.4)
WBC: 8.5 10*3/uL (ref 3.4–10.8)

## 2020-09-16 LAB — COMPREHENSIVE METABOLIC PANEL
ALT: 15 IU/L (ref 0–44)
AST: 22 IU/L (ref 0–40)
Albumin/Globulin Ratio: 1.8 (ref 1.2–2.2)
Albumin: 4.5 g/dL (ref 3.5–4.6)
Alkaline Phosphatase: 76 IU/L (ref 44–121)
BUN/Creatinine Ratio: 14 (ref 10–24)
BUN: 31 mg/dL (ref 10–36)
Bilirubin Total: 0.5 mg/dL (ref 0.0–1.2)
CO2: 20 mmol/L (ref 20–29)
Calcium: 9.4 mg/dL (ref 8.6–10.2)
Chloride: 104 mmol/L (ref 96–106)
Creatinine, Ser: 2.24 mg/dL — ABNORMAL HIGH (ref 0.76–1.27)
Globulin, Total: 2.5 g/dL (ref 1.5–4.5)
Glucose: 110 mg/dL — ABNORMAL HIGH (ref 65–99)
Potassium: 5.2 mmol/L (ref 3.5–5.2)
Sodium: 142 mmol/L (ref 134–144)
Total Protein: 7 g/dL (ref 6.0–8.5)
eGFR: 27 mL/min/{1.73_m2} — ABNORMAL LOW (ref >59)

## 2020-09-16 LAB — LIPID PANEL
Chol/HDL Ratio: 3.7 ratio (ref 0.0–5.0)
Cholesterol, Total: 169 mg/dL (ref 100–199)
HDL: 46 mg/dL (ref >39)
LDL Chol Calc (NIH): 95 mg/dL (ref 0–99)
Triglycerides: 158 mg/dL — ABNORMAL HIGH (ref 0–149)
VLDL Cholesterol Cal: 28 mg/dL (ref 5–40)

## 2020-09-22 ENCOUNTER — Ambulatory Visit: Payer: Medicare Other | Admitting: Cardiology

## 2020-10-27 DIAGNOSIS — K219 Gastro-esophageal reflux disease without esophagitis: Secondary | ICD-10-CM | POA: Diagnosis not present

## 2020-10-27 DIAGNOSIS — R195 Other fecal abnormalities: Secondary | ICD-10-CM | POA: Diagnosis not present

## 2020-10-28 ENCOUNTER — Other Ambulatory Visit: Payer: Self-pay | Admitting: Physician Assistant

## 2020-11-02 ENCOUNTER — Other Ambulatory Visit: Payer: Self-pay

## 2020-11-02 ENCOUNTER — Ambulatory Visit (HOSPITAL_BASED_OUTPATIENT_CLINIC_OR_DEPARTMENT_OTHER)
Admission: RE | Admit: 2020-11-02 | Discharge: 2020-11-02 | Disposition: A | Discharge status: Home / Self Care | Payer: Medicare Other | Source: Ambulatory Visit | Attending: Emergency Medicine | Admitting: Emergency Medicine

## 2020-11-02 DIAGNOSIS — I2510 Atherosclerotic heart disease of native coronary artery without angina pectoris: Secondary | ICD-10-CM | POA: Diagnosis not present

## 2020-11-02 DIAGNOSIS — J984 Other disorders of lung: Secondary | ICD-10-CM | POA: Diagnosis not present

## 2020-11-02 DIAGNOSIS — J948 Other specified pleural conditions: Secondary | ICD-10-CM | POA: Diagnosis not present

## 2020-11-02 DIAGNOSIS — K449 Diaphragmatic hernia without obstruction or gangrene: Secondary | ICD-10-CM | POA: Diagnosis not present

## 2020-11-02 DIAGNOSIS — R911 Solitary pulmonary nodule: Secondary | ICD-10-CM | POA: Diagnosis not present

## 2020-12-08 ENCOUNTER — Telehealth: Payer: Self-pay | Admitting: Cardiology

## 2020-12-08 MED ORDER — LISINOPRIL 20 MG PO TABS: 20.0000 mg | ORAL_TABLET | Freq: Every day | ORAL | 2 refills | Status: DC

## 2020-12-08 NOTE — Telephone Encounter (Signed)
Let patient know that I sent in refills for his Lisinopril over to the pharmacy.

## 2020-12-08 NOTE — Telephone Encounter (Signed)
*  STAT* If patient is at the pharmacy, call can be transferred to refill team.   1. Which medications need to be refilled? (please list name of each medication and dose if known) need a new prescription for his Lisinopril- the original doctor that had him on it retired  2. Which pharmacy/location (including street and city if local pharmacy) is medication to be sent to? [facility]  [address], [place]  3. Do they need a 30 day or 90 day supply? 90 days and refills

## 2020-12-13 ENCOUNTER — Telehealth: Payer: Self-pay | Admitting: Cardiology

## 2020-12-13 MED ORDER — ATORVASTATIN CALCIUM 40 MG PO TABS: 20.0000 mg | ORAL_TABLET | Freq: Two times a day (BID) | ORAL | 3 refills | Status: DC

## 2020-12-13 NOTE — Telephone Encounter (Signed)
*  STAT* If patient is at the pharmacy, call can be transferred to refill team.   1. Which medications need to be refilled? (please list name of each medication and dose if known) atorvastatin (LIPITOR) 40 MG tablet  2. Which pharmacy/location (including street and city if local pharmacy) is medication to be sent to?  [id number] - [place], [place] - [address] 3. Do they need a 30 day or 90 day supply? 90 day supply

## 2020-12-13 NOTE — Telephone Encounter (Signed)
Atorvastatin 40 mg sent to desired pharmacy

## 2020-12-15 ENCOUNTER — Other Ambulatory Visit: Payer: Self-pay

## 2020-12-15 MED ORDER — APIXABAN 2.5 MG PO TABS: 2.5000 mg | ORAL_TABLET | Freq: Two times a day (BID) | ORAL | 1 refills | Status: DC

## 2020-12-15 NOTE — Telephone Encounter (Signed)
35m, 88.4kg, scr 2.24 09/15/20, lovw/crenshaw 09/15/20

## 2021-01-06 ENCOUNTER — Telehealth: Payer: Self-pay | Admitting: Cardiology

## 2021-01-06 MED ORDER — LISINOPRIL 20 MG PO TABS: 20.0000 mg | ORAL_TABLET | Freq: Every day | ORAL | 1 refills | Status: DC

## 2021-01-06 NOTE — Telephone Encounter (Signed)
*  STAT* If patient is at the pharmacy, call can be transferred to refill team.   1. Which medications need to be refilled? (please list name of each medication and dose if known)  lisinopril (ZESTRIL) 20 MG tablet  2. Which pharmacy/location (including street and city if local pharmacy) is medication to be sent to? [facility] [place], [place]  3. Do they need a 30 day or 90 day supply?  90 day supply

## 2021-01-06 NOTE — Telephone Encounter (Signed)
Refills has been sent over to the pharmacy.

## 2021-02-24 NOTE — Progress Notes (Signed)
HPI: Follow-up coronary artery disease and prior aortic valve replacement.  Patient is status post coronary artery bypass and graft in 1987 and had aortic valve replacement with a bioprosthetic valve in 2006. Patient has known peripheral vascular disease and was evaluated by Dr. Gwenlyn Found and medical therapy recommended. Had enterococcal bacteremia April 2020.  Has been treated with antibiotics.  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  Had discitis May 2020.  Echocardiogram April 2020 showed normal LV function, moderate left atrial enlargement, bioprosthetic aortic valve with no obvious vegetation and normal mean gradient.  Follow-up study May 2020 unchanged. Had TIA May 2020. Transesophageal echocardiogram July 2020 showed normal LV function, mild to moderate mitral regurgitation, bioprosthetic aortic valve with no significant vegetation noted. CTA July 2020 showed 30% stenosis on the left, occluded left vertebral.  Nuclear study July 2020 showed ejection fraction 52%, inferior defect suggestive of scar versus diaphragmatic attenuation but no ischemia. Also with question of atrial fibrillation during previous hospitalization. Monitor October 2020 showed sinus bradycardia, normal sinus rhythm, sinus tachycardia, PACs including one nonconducted and PVCs. Since last seen patient denies dyspnea, chest pain, palpitations or syncope.  Current Outpatient Medications  Medication Sig Dispense Refill   acetaminophen (TYLENOL) 500 MG tablet Take 1,000 mg by mouth at bedtime.     amLODipine (NORVASC) 5 MG tablet Take 1 tablet (5 mg total) by mouth daily. 90 tablet 2   apixaban (ELIQUIS) 2.5 MG TABS tablet Take 1 tablet (2.5 mg total) by mouth 2 (two) times daily. 180 tablet 1   atorvastatin (LIPITOR) 40 MG tablet Take 0.5 tablets (20 mg total) by mouth in the morning and at bedtime. 90 tablet 3   Calcium Carbonate (CALCIUM 600 PO) Take 600 mg by mouth daily.     Cholecalciferol (VITAMIN D-3 PO) Take  1 capsule by mouth daily.     folic acid (FOLVITE) A999333 MCG tablet Take 400 mcg by mouth 2 (two) times a week.     Glucosamine HCl (GLUCOSAMINE PO) Take 1 tablet by mouth daily.     HYDROcodone-acetaminophen (NORCO/VICODIN) 5-325 MG tablet Take 1-2 tablets by mouth every 4 (four) hours as needed for moderate pain. (Patient taking differently: Take 1 tablet by mouth as needed for moderate pain.) 30 tablet 0   lisinopril (ZESTRIL) 20 MG tablet Take 1 tablet (20 mg total) by mouth daily. 90 tablet 1   loperamide (IMODIUM) 2 MG capsule Take 1 capsule (2 mg total) by mouth 4 (four) times daily as needed for diarrhea or loose stools. 30 capsule 0   metoprolol succinate (TOPROL-XL) 100 MG 24 hr tablet TAKE 1 TABLET BY MOUTH DAILY. TAKE WITH OR IMMEDIATELY FOLLOWING A MEAL. GENERIC EQUIVALENT FOR TOPROL XL. 90 tablet 1   Multiple Vitamin (THERAGRAN PO) Take 1 tablet by mouth daily.     nitroGLYCERIN (NITROSTAT) 0.4 MG SL tablet Place 1 tablet (0.4 mg total) under the tongue every 5 (five) minutes as needed for chest pain. 25 tablet 11   pantoprazole (PROTONIX) 40 MG tablet Take 40 mg by mouth daily.     timolol (TIMOPTIC) 0.5 % ophthalmic solution Place 1 drop into the right eye 2 (two) times daily.   3   vitamin C (ASCORBIC ACID) 500 MG tablet Take 500 mg by mouth daily.     Wheat Dextrin (BENEFIBER) POWD Take by mouth as directed.     No current facility-administered medications for this visit.     Past Medical History:  Diagnosis Date  Aortic stenosis    a. 2006 s/p bioprosthetic AVR; b. 12/2018 TEE: EF 60-65%, Nl AoV fxn w/o dehiscence, regurgitation, perivalvular leak, or abscess.   Bacteremia due to Enterococcus 09/2018   a. ? discitis vs SBE; b. 12/2018 TEE: No evidence of vegetation, nl fxn'ing bioprosthetic AoV.   Cancer [facility])    Carotid arterial disease ([place])    a. 10/2018 Carotid U/S: 1-39% bilat ICA dzs.   Coronary artery disease    a. 1987 s/p CABG x 5; 05/2014 MV: EF 70%, no  significant ischemia.   Discitis of lumbar region    a. 10/2018 MRI L2-3 acute discitis osteomyelitis.   Endocarditis    a. 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   History of prostatectomy    Hyperlipidemia    Hypertension    Lumbar spinal stenosis    a. 10/2018 MRI: mild L2-3 and mod L3-4 spinal canal stenosis.   PAD (peripheral artery disease) ([place]) 06/06/2017   TIA (transient ischemic attack)    a. 10/2018   Vertebral artery occlusion, left    a. 10/2018 CTA head/neck: Occlusion of prox V4 segment of the nondominant L vertebral artery - unknown chronicity.    Past Surgical History:  Procedure Laterality Date   CARDIAC CATHETERIZATION     EF of 66%   CARDIAC VALVE REPLACEMENT  2006   CORONARY ARTERY BYPASS GRAFT  1987   x5   I & D EXTREMITY Left 01/10/2015   Procedure: DEBRIDEMENT ANKLE PLACEMENT ANTIBIOTIC BEADS AND WOUND VAC;  Surgeon: [name], MD;  Location: [facility];  Service: Orthopedics;  Laterality: Left;   IR LUMBAR [place]C ASPIRATION W/IMG GUIDE  11/10/2018   TEE WITHOUT CARDIOVERSION N/A 12/18/2018   Procedure: TRANSESOPHAGEAL ECHOCARDIOGRAM (TEE);  Surgeon: Jerline Pain, MD;  Location: [facility] ENDOSCOPY;  Service: Cardiovascular;  Laterality: N/A;    Social History   Socioeconomic History   Marital status: Married    Spouse name: Not on file   Number of children: Not on file   Years of education: Not on file   Highest education level: Not on file  Occupational History   Not on file  Tobacco Use   Smoking status: Former    Types: Cigarettes    Quit date: 11/18/1955    Years since quitting: 65.3   Smokeless tobacco: Never  Substance and Sexual Activity   Alcohol use: No   Drug use: No   Sexual activity: Not on file  Other Topics Concern   Not on file  Social History Narrative   Not on file   Social Determinants of Health   Financial Resource Strain: Not on file  Food Insecurity: Not on  file  Transportation Needs: Not on file  Physical Activity: Not on file  Stress: Not on file  Social Connections: Not on file  Intimate Partner Violence: Not on file    Family History  Problem Relation Age of Onset   Heart disease Father    Heart failure Mother    Heart attack Brother    Congestive Heart Failure Sister    Congestive Heart Failure Sister     ROS: no fevers or chills, productive cough, hemoptysis, dysphasia, odynophagia, melena, hematochezia, dysuria, hematuria, rash, seizure activity, orthopnea, PND, pedal edema, claudication. Remaining systems are negative.  Physical Exam: Well-developed well-nourished in no acute distress.  Skin is warm and dry.  HEENT is normal.  Neck is supple.  Chest is clear to auscultation with  normal expansion.  Cardiovascular exam is regular rate and rhythm.  2/6 systolic murmur left sternal border.  No diastolic murmur. Abdominal exam nontender or distended. No masses palpated. Extremities show no edema. neuro grossly intact  A/P  1 history of present enterococcal prosthetic valve endocarditis-improved with antibiotics previously.  2 coronary artery disease-continue statin.  No aspirin given need for anticoagulation.  3 question paroxysmal fibrillation-previous strips were not available for review.  Given history of TIA I have elected to continue anticoagulation.  Continue beta-blocker.  4 hypertension-patient's blood pressure is controlled today.  Continue present medical regimen.  5 hyperlipidemia-continue statin.  6 peripheral vascular disease-continue statin.    Kirk Ruths, MD

## 2021-02-28 DIAGNOSIS — N184 Chronic kidney disease, stage 4 (severe): Secondary | ICD-10-CM | POA: Diagnosis not present

## 2021-02-28 DIAGNOSIS — I10 Essential (primary) hypertension: Secondary | ICD-10-CM | POA: Diagnosis not present

## 2021-02-28 DIAGNOSIS — E7800 Pure hypercholesterolemia, unspecified: Secondary | ICD-10-CM | POA: Diagnosis not present

## 2021-02-28 DIAGNOSIS — I700 Atherosclerosis of aorta: Secondary | ICD-10-CM | POA: Diagnosis not present

## 2021-03-02 ENCOUNTER — Other Ambulatory Visit: Payer: Self-pay

## 2021-03-02 ENCOUNTER — Encounter: Payer: Self-pay | Admitting: Cardiology

## 2021-03-02 ENCOUNTER — Ambulatory Visit: Payer: Medicare Other | Admitting: Cardiology

## 2021-03-02 VITALS — BP 128/72 | HR 62 | Ht 67.0 in | Wt 195.1 lb

## 2021-03-02 DIAGNOSIS — E785 Hyperlipidemia, unspecified: Secondary | ICD-10-CM

## 2021-03-02 DIAGNOSIS — Z952 Presence of prosthetic heart valve: Secondary | ICD-10-CM

## 2021-03-02 DIAGNOSIS — I25810 Atherosclerosis of coronary artery bypass graft(s) without angina pectoris: Secondary | ICD-10-CM

## 2021-03-02 DIAGNOSIS — I480 Paroxysmal atrial fibrillation: Secondary | ICD-10-CM

## 2021-03-02 DIAGNOSIS — I10 Essential (primary) hypertension: Secondary | ICD-10-CM | POA: Diagnosis not present

## 2021-03-02 NOTE — Patient Instructions (Signed)
Medication Instructions:   Your physician recommends that you continue on your current medications as directed. Please refer to the Current Medication list given to you today.  *If you need a refill on your cardiac medications before your next appointment, please call your pharmacy*   Lab Work:  -NONE  If you have labs (blood work) drawn today and your tests are completely normal, you will receive your results only by: [place] (if you have MyChart) OR A paper copy in the mail If you have any lab test that is abnormal or we need to change your treatment, we will call you to review the results.   Testing/Procedures:  -NONE   Follow-Up: At [facility], you and your health needs are our priority.  As part of our continuing mission to provide you with exceptional heart care, we have created designated Provider Care Teams.  These Care Teams include your primary Cardiologist (physician) and Advanced Practice Providers (APPs -  Physician Assistants and Nurse Practitioners) who all work together to provide you with the care you need, when you need it.  We recommend signing up for the patient portal called "MyChart".  Sign up information is provided on this After Visit Summary.  MyChart is used to connect with patients for Virtual Visits (Telemedicine).  Patients are able to view lab/test results, encounter notes, upcoming appointments, etc.  Non-urgent messages can be sent to your provider as well.   To learn more about what you can do with MyChart, go to [email or web address].    Your next appointment:   6 month(s)  The format for your next appointment:   In Person  Provider:   Dr. Stanford Breed   Other Instructions -None

## 2021-03-06 ENCOUNTER — Other Ambulatory Visit: Payer: Self-pay | Admitting: Cardiology

## 2021-04-20 ENCOUNTER — Other Ambulatory Visit: Payer: Self-pay

## 2021-05-24 ENCOUNTER — Telehealth: Payer: Self-pay | Admitting: Cardiology

## 2021-05-24 MED ORDER — LISINOPRIL 20 MG PO TABS: 20.0000 mg | ORAL_TABLET | Freq: Every day | ORAL | 1 refills | Status: DC

## 2021-05-24 MED ORDER — ATORVASTATIN CALCIUM 40 MG PO TABS: 40.0000 mg | ORAL_TABLET | Freq: Every day | ORAL | 1 refills | Status: DC

## 2021-05-24 NOTE — Telephone Encounter (Signed)
  *  STAT* If patient is at the pharmacy, call can be transferred to refill team.   1. Which medications need to be refilled? (please list name of each medication and dose if known) atorvastatin (LIPITOR) 40 MG tablet lisinopril (ZESTRIL) 20 MG tablet  2. Which pharmacy/location (including street and city if local pharmacy) is medication to be sent to? [facility] [place], [place]  3. Do they need a 30 day or 90 day supply? 90 days

## 2021-05-24 NOTE — Telephone Encounter (Signed)
Refills has been sent to pharmacy. 

## 2021-05-25 ENCOUNTER — Other Ambulatory Visit: Payer: Self-pay

## 2021-05-25 MED ORDER — LISINOPRIL 20 MG PO TABS: 20.0000 mg | ORAL_TABLET | Freq: Every day | ORAL | 1 refills | Status: DC

## 2021-06-11 ENCOUNTER — Inpatient Hospital Stay (HOSPITAL_BASED_OUTPATIENT_CLINIC_OR_DEPARTMENT_OTHER)
Admission: EM | Admit: 2021-06-11 | Discharge: 2021-06-14 | DRG: 872 | Disposition: A | Discharge status: Home / Self Care | Payer: Medicare Other | Attending: Family Medicine | Admitting: Family Medicine

## 2021-06-11 ENCOUNTER — Encounter (HOSPITAL_BASED_OUTPATIENT_CLINIC_OR_DEPARTMENT_OTHER): Payer: Self-pay

## 2021-06-11 ENCOUNTER — Other Ambulatory Visit: Payer: Self-pay

## 2021-06-11 ENCOUNTER — Emergency Department (HOSPITAL_BASED_OUTPATIENT_CLINIC_OR_DEPARTMENT_OTHER): Payer: Medicare Other

## 2021-06-11 DIAGNOSIS — A419 Sepsis, unspecified organism: Secondary | ICD-10-CM | POA: Diagnosis not present

## 2021-06-11 DIAGNOSIS — D696 Thrombocytopenia, unspecified: Secondary | ICD-10-CM | POA: Diagnosis not present

## 2021-06-11 DIAGNOSIS — T3695XA Adverse effect of unspecified systemic antibiotic, initial encounter: Secondary | ICD-10-CM | POA: Diagnosis present

## 2021-06-11 DIAGNOSIS — S2231XA Fracture of one rib, right side, initial encounter for closed fracture: Secondary | ICD-10-CM | POA: Diagnosis not present

## 2021-06-11 DIAGNOSIS — K521 Toxic gastroenteritis and colitis: Secondary | ICD-10-CM | POA: Diagnosis not present

## 2021-06-11 DIAGNOSIS — Z953 Presence of xenogenic heart valve: Secondary | ICD-10-CM | POA: Diagnosis not present

## 2021-06-11 DIAGNOSIS — I959 Hypotension, unspecified: Secondary | ICD-10-CM | POA: Diagnosis not present

## 2021-06-11 DIAGNOSIS — E785 Hyperlipidemia, unspecified: Secondary | ICD-10-CM | POA: Diagnosis present

## 2021-06-11 DIAGNOSIS — I2510 Atherosclerotic heart disease of native coronary artery without angina pectoris: Secondary | ICD-10-CM | POA: Diagnosis present

## 2021-06-11 DIAGNOSIS — A4151 Sepsis due to Escherichia coli [E. coli]: Secondary | ICD-10-CM | POA: Diagnosis not present

## 2021-06-11 DIAGNOSIS — N2889 Other specified disorders of kidney and ureter: Secondary | ICD-10-CM | POA: Diagnosis not present

## 2021-06-11 DIAGNOSIS — Z8673 Personal history of transient ischemic attack (TIA), and cerebral infarction without residual deficits: Secondary | ICD-10-CM

## 2021-06-11 DIAGNOSIS — R9431 Abnormal electrocardiogram [ECG] [EKG]: Secondary | ICD-10-CM | POA: Diagnosis not present

## 2021-06-11 DIAGNOSIS — Z8546 Personal history of malignant neoplasm of prostate: Secondary | ICD-10-CM

## 2021-06-11 DIAGNOSIS — N1832 Chronic kidney disease, stage 3b: Secondary | ICD-10-CM | POA: Diagnosis present

## 2021-06-11 DIAGNOSIS — E669 Obesity, unspecified: Secondary | ICD-10-CM | POA: Diagnosis present

## 2021-06-11 DIAGNOSIS — M47816 Spondylosis without myelopathy or radiculopathy, lumbar region: Secondary | ICD-10-CM | POA: Diagnosis not present

## 2021-06-11 DIAGNOSIS — I129 Hypertensive chronic kidney disease with stage 1 through stage 4 chronic kidney disease, or unspecified chronic kidney disease: Secondary | ICD-10-CM | POA: Diagnosis present

## 2021-06-11 DIAGNOSIS — Z1619 Resistance to other specified beta lactam antibiotics: Secondary | ICD-10-CM | POA: Diagnosis present

## 2021-06-11 DIAGNOSIS — R739 Hyperglycemia, unspecified: Secondary | ICD-10-CM | POA: Diagnosis present

## 2021-06-11 DIAGNOSIS — I480 Paroxysmal atrial fibrillation: Secondary | ICD-10-CM | POA: Diagnosis present

## 2021-06-11 DIAGNOSIS — Z951 Presence of aortocoronary bypass graft: Secondary | ICD-10-CM | POA: Diagnosis not present

## 2021-06-11 DIAGNOSIS — Z8249 Family history of ischemic heart disease and other diseases of the circulatory system: Secondary | ICD-10-CM

## 2021-06-11 DIAGNOSIS — R000 Tachycardia, unspecified: Secondary | ICD-10-CM | POA: Diagnosis present

## 2021-06-11 DIAGNOSIS — N179 Acute kidney failure, unspecified: Secondary | ICD-10-CM | POA: Diagnosis present

## 2021-06-11 DIAGNOSIS — E872 Acidosis, unspecified: Secondary | ICD-10-CM | POA: Diagnosis present

## 2021-06-11 DIAGNOSIS — Z6830 Body mass index (BMI) 30.0-30.9, adult: Secondary | ICD-10-CM

## 2021-06-11 DIAGNOSIS — Z79899 Other long term (current) drug therapy: Secondary | ICD-10-CM

## 2021-06-11 DIAGNOSIS — Z7901 Long term (current) use of anticoagulants: Secondary | ICD-10-CM

## 2021-06-11 DIAGNOSIS — N390 Urinary tract infection, site not specified: Secondary | ICD-10-CM | POA: Diagnosis present

## 2021-06-11 DIAGNOSIS — Z8619 Personal history of other infectious and parasitic diseases: Secondary | ICD-10-CM

## 2021-06-11 DIAGNOSIS — Z66 Do not resuscitate: Secondary | ICD-10-CM | POA: Diagnosis not present

## 2021-06-11 DIAGNOSIS — R7881 Bacteremia: Secondary | ICD-10-CM | POA: Diagnosis not present

## 2021-06-11 DIAGNOSIS — R6520 Severe sepsis without septic shock: Secondary | ICD-10-CM | POA: Diagnosis not present

## 2021-06-11 DIAGNOSIS — Z952 Presence of prosthetic heart valve: Secondary | ICD-10-CM | POA: Diagnosis not present

## 2021-06-11 DIAGNOSIS — I739 Peripheral vascular disease, unspecified: Secondary | ICD-10-CM | POA: Diagnosis present

## 2021-06-11 DIAGNOSIS — R5383 Other fatigue: Secondary | ICD-10-CM | POA: Diagnosis not present

## 2021-06-11 DIAGNOSIS — Z9079 Acquired absence of other genital organ(s): Secondary | ICD-10-CM

## 2021-06-11 DIAGNOSIS — N183 Chronic kidney disease, stage 3 unspecified: Secondary | ICD-10-CM | POA: Diagnosis not present

## 2021-06-11 DIAGNOSIS — K5730 Diverticulosis of large intestine without perforation or abscess without bleeding: Secondary | ICD-10-CM | POA: Diagnosis not present

## 2021-06-11 DIAGNOSIS — D631 Anemia in chronic kidney disease: Secondary | ICD-10-CM | POA: Diagnosis present

## 2021-06-11 DIAGNOSIS — I700 Atherosclerosis of aorta: Secondary | ICD-10-CM | POA: Diagnosis not present

## 2021-06-11 DIAGNOSIS — Z87891 Personal history of nicotine dependence: Secondary | ICD-10-CM

## 2021-06-11 DIAGNOSIS — Z20822 Contact with and (suspected) exposure to covid-19: Secondary | ICD-10-CM | POA: Diagnosis not present

## 2021-06-11 LAB — COMPREHENSIVE METABOLIC PANEL
ALT: 15 U/L (ref 0–44)
AST: 31 U/L (ref 15–41)
Albumin: 3.7 g/dL (ref 3.5–5.0)
Alkaline Phosphatase: 60 U/L (ref 38–126)
Anion gap: 12 (ref 5–15)
BUN: 37 mg/dL — ABNORMAL HIGH (ref 8–23)
CO2: 20 mmol/L — ABNORMAL LOW (ref 22–32)
Calcium: 8.4 mg/dL — ABNORMAL LOW (ref 8.9–10.3)
Chloride: 98 mmol/L (ref 98–111)
Creatinine, Ser: 2.64 mg/dL — ABNORMAL HIGH (ref 0.61–1.24)
GFR, Estimated: 22 mL/min — ABNORMAL LOW (ref >60)
Glucose, Bld: 193 mg/dL — ABNORMAL HIGH (ref 70–99)
Potassium: 4.3 mmol/L (ref 3.5–5.1)
Sodium: 130 mmol/L — ABNORMAL LOW (ref 135–145)
Total Bilirubin: 1 mg/dL (ref 0.3–1.2)
Total Protein: 7 g/dL (ref 6.5–8.1)

## 2021-06-11 LAB — APTT: aPTT: 37 seconds — ABNORMAL HIGH (ref 24–36)

## 2021-06-11 LAB — CBC WITH DIFFERENTIAL/PLATELET
Abs Immature Granulocytes: 0.06 10*3/uL (ref 0.00–0.07)
Basophils Absolute: 0 10*3/uL (ref 0.0–0.1)
Basophils Relative: 0 %
Eosinophils Absolute: 0 10*3/uL (ref 0.0–0.5)
Eosinophils Relative: 0 %
HCT: 33.8 % — ABNORMAL LOW (ref 39.0–52.0)
Hemoglobin: 11.4 g/dL — ABNORMAL LOW (ref 13.0–17.0)
Immature Granulocytes: 0 %
Lymphocytes Relative: 5 %
Lymphs Abs: 0.7 10*3/uL (ref 0.7–4.0)
MCH: 31.1 pg (ref 26.0–34.0)
MCHC: 33.7 g/dL (ref 30.0–36.0)
MCV: 92.3 fL (ref 80.0–100.0)
Monocytes Absolute: 1 10*3/uL (ref 0.1–1.0)
Monocytes Relative: 7 %
Neutro Abs: 12.1 10*3/uL — ABNORMAL HIGH (ref 1.7–7.7)
Neutrophils Relative %: 88 %
Platelets: 140 10*3/uL — ABNORMAL LOW (ref 150–400)
RBC: 3.66 MIL/uL — ABNORMAL LOW (ref 4.22–5.81)
RDW: 13 % (ref 11.5–15.5)
WBC: 13.8 10*3/uL — ABNORMAL HIGH (ref 4.0–10.5)
nRBC: 0 % (ref 0.0–0.2)

## 2021-06-11 LAB — URINALYSIS, ROUTINE W REFLEX MICROSCOPIC
Bilirubin Urine: NEGATIVE
Glucose, UA: NEGATIVE mg/dL
Ketones, ur: NEGATIVE mg/dL
Nitrite: POSITIVE — AB
Protein, ur: 100 mg/dL — AB
Specific Gravity, Urine: 1.025 (ref 1.005–1.030)
pH: 5.5 (ref 5.0–8.0)

## 2021-06-11 LAB — URINALYSIS, MICROSCOPIC (REFLEX): WBC, UA: >50 WBC/hpf (ref 0–5)

## 2021-06-11 LAB — RESP PANEL BY RT-PCR (FLU A&B, COVID) ARPGX2
Influenza A by PCR: NEGATIVE
Influenza B by PCR: NEGATIVE
SARS Coronavirus 2 by RT PCR: NEGATIVE

## 2021-06-11 LAB — LACTIC ACID, PLASMA
Lactic Acid, Venous: 2.2 mmol/L (ref 0.5–1.9)
Lactic Acid, Venous: 2.4 mmol/L (ref 0.5–1.9)

## 2021-06-11 LAB — PROTIME-INR
INR: 1.9 — ABNORMAL HIGH (ref 0.8–1.2)
Prothrombin Time: 21.5 seconds — ABNORMAL HIGH (ref 11.4–15.2)

## 2021-06-11 MED ORDER — SODIUM CHLORIDE 0.9 % IV SOLN
2.0000 g | INTRAVENOUS | Status: DC
  Administered 2021-06-12: 16:00:00 2 g via INTRAVENOUS
  Filled 2021-06-11: qty 2

## 2021-06-11 MED ORDER — SODIUM CHLORIDE 0.9 % IV SOLN
2.0000 g | Freq: Once | INTRAVENOUS | Status: AC
  Administered 2021-06-11: 16:00:00 2 g via INTRAVENOUS
  Filled 2021-06-11: qty 2

## 2021-06-11 MED ORDER — METRONIDAZOLE 500 MG/100ML IV SOLN
500.0000 mg | Freq: Once | INTRAVENOUS | Status: AC
  Administered 2021-06-11: 17:00:00 500 mg via INTRAVENOUS
  Filled 2021-06-11: qty 100

## 2021-06-11 MED ORDER — VANCOMYCIN HCL IN DEXTROSE 1-5 GM/200ML-% IV SOLN
1000.0000 mg | Freq: Once | INTRAVENOUS | Status: AC
  Administered 2021-06-11: 18:00:00 1000 mg via INTRAVENOUS
  Filled 2021-06-11: qty 200

## 2021-06-11 MED ORDER — TIMOLOL MALEATE 0.5 % OP SOLN
1.0000 [drp] | Freq: Two times a day (BID) | OPHTHALMIC | Status: DC
  Administered 2021-06-12 – 2021-06-14 (×5): 1 [drp] via OPHTHALMIC
  Filled 2021-06-11: qty 5

## 2021-06-11 MED ORDER — ACETAMINOPHEN 325 MG PO TABS
650.0000 mg | ORAL_TABLET | Freq: Four times a day (QID) | ORAL | Status: DC | PRN
  Administered 2021-06-12 – 2021-06-13 (×2): 650 mg via ORAL
  Filled 2021-06-11 (×2): qty 2

## 2021-06-11 MED ORDER — ATORVASTATIN CALCIUM 40 MG PO TABS
40.0000 mg | ORAL_TABLET | Freq: Every day | ORAL | Status: DC
  Administered 2021-06-12 – 2021-06-14 (×3): 40 mg via ORAL
  Filled 2021-06-11 (×3): qty 1

## 2021-06-11 MED ORDER — APIXABAN 2.5 MG PO TABS
2.5000 mg | ORAL_TABLET | Freq: Two times a day (BID) | ORAL | Status: DC
  Administered 2021-06-12 – 2021-06-14 (×6): 2.5 mg via ORAL
  Filled 2021-06-11 (×6): qty 1

## 2021-06-11 MED ORDER — ACETAMINOPHEN 650 MG RE SUPP: 650.0000 mg | Freq: Four times a day (QID) | RECTAL | Status: DC | PRN

## 2021-06-11 MED ORDER — HYDRALAZINE HCL 20 MG/ML IJ SOLN: 10.0000 mg | INTRAMUSCULAR | Status: DC | PRN

## 2021-06-11 MED ORDER — LACTATED RINGERS IV SOLN: INTRAVENOUS | Status: DC

## 2021-06-11 MED ORDER — LACTATED RINGERS IV BOLUS
1000.0000 mL | Freq: Once | INTRAVENOUS | Status: AC
  Administered 2021-06-11: 17:00:00 1000 mL via INTRAVENOUS

## 2021-06-11 MED ORDER — SODIUM CHLORIDE 0.9 % IV BOLUS
750.0000 mL | Freq: Once | INTRAVENOUS | Status: AC
  Administered 2021-06-11: 18:00:00 750 mL via INTRAVENOUS

## 2021-06-11 MED ORDER — VANCOMYCIN HCL 750 MG/150ML IV SOLN
750.0000 mg | INTRAVENOUS | Status: DC
  Filled 2021-06-11: qty 150

## 2021-06-11 MED ORDER — ACETAMINOPHEN 325 MG PO TABS
650.0000 mg | ORAL_TABLET | Freq: Once | ORAL | Status: AC | PRN
  Administered 2021-06-11: 15:00:00 650 mg via ORAL
  Filled 2021-06-11: qty 2

## 2021-06-11 MED ORDER — LACTATED RINGERS IV BOLUS (SEPSIS)
1000.0000 mL | Freq: Once | INTRAVENOUS | Status: AC
  Administered 2021-06-11: 16:00:00 1000 mL via INTRAVENOUS

## 2021-06-11 NOTE — ED Notes (Signed)
Pt continues to be tachy. EKG done.

## 2021-06-11 NOTE — Plan of Care (Signed)
TRH transfer note:  [facility] transfer: 87 yo M with fever, sepsis, AKI.  Initial hypotension seems improved after IVF.  Looks like source is UTI.  Will send to [facility].  PMH: HTN, CKD 3, endocarditis / diskitis, on eliquis for PAF.  Valve replacement (bioprosthetic).  TRH will assume care on arrival to accepting facility. Until arrival, care as per EDP. However, [facility]H available 24/7 for questions and assistance.  Nursing staff, please page [place] and Consults [phone]) as soon as the patient arrives the hospital.

## 2021-06-11 NOTE — ED Notes (Signed)
Called [facility] for consult to Hospitalist at [phone].

## 2021-06-11 NOTE — Progress Notes (Signed)
Pharmacy Antibiotic Note  Randy Chen is a 87 y.o. male admitted on 06/11/2021 with sepsis.  Pharmacy has been consulted for vancomycin and cefepime dosing.  SCr 2.64, BL appears close to 2  Plan: Vancomycin 1000 mg IV x 1, then 750 mg IV q 48h (eAUC 462, Goal AUC 400-550, SCr 2.64) Cefepime 2g IV q 24h Monitor renal function, Cx and clinical progression to narrow Vancomycin levels as needed  Height: 5\' 7"  (170.2 cm) Weight: 87.5 kg (193 lb) IBW/kg (Calculated) : 66.1  Temp (24hrs), Avg:103 F (39.4 C), Min:103 F (39.4 C), Max:103 F (39.4 C)  Recent Labs  Lab 06/11/21 1543  WBC 13.8*  CREATININE 2.64*  LATICACIDVEN 2.2*    Estimated Creatinine Clearance: 19.3 mL/min (A) (by C-G formula based on SCr of 2.64 mg/dL (H)).    No Known Allergies  Bertis Ruddy, PharmD Clinical Pharmacist ED Pharmacist Phone # [phone] 06/11/2021 4:29 PM

## 2021-06-11 NOTE — ED Triage Notes (Signed)
C/o fatigue x 10 days, decreased appetite, chills, shortness of breath, urinary frequency

## 2021-06-11 NOTE — ED Notes (Signed)
Pt found out of bed to use urinal. Pt accidentally pulled out IV in Left FA. Pt dyspnic on return to bed. RT at bedside. Oxygen sat 83% on RA. Placed on oxygen 3L via [place] by RT.

## 2021-06-11 NOTE — ED Provider Notes (Addendum)
[place] EMERGENCY DEPARTMENT Provider Note   CSN: [id number] Arrival date & time: 06/11/21  1507     History Chief Complaint  Patient presents with   Fatigue    BERWYN BIGLEY is a 87 y.o. male.  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  Last 10 days has had extreme fatigue, on occasion will get the chills, zero appetite Urination erratic, frequency, 6-8 times in the night into the next day, no dysuria, did have urgency.  No pain. No abdominal pain.  Not sure if able to empty it completely Has been drinking fluids, no appetite Today first day he knows of he had a fever, last night did have night sweats   Cough per wife he does not think much. He reports shortness of breath. Has cough fairly often, not brand new.  No pain chest.     Past Medical History:  Diagnosis Date   Aortic stenosis    a. 2006 s/p bioprosthetic AVR; b. 12/2018 TEE: EF 60-65%, Nl AoV fxn w/o dehiscence, regurgitation, perivalvular leak, or abscess.   Bacteremia due to Enterococcus 09/2018   a. ? discitis vs SBE; b. 12/2018 TEE: No evidence of vegetation, nl fxn'ing bioprosthetic AoV.   Cancer [facility])    Carotid arterial disease ([place])    a. 10/2018 Carotid U/S: 1-39% bilat ICA dzs.   Coronary artery disease    a. 1987 s/p CABG x 5; 05/2014 MV: EF 70%, no significant ischemia.   Discitis of lumbar region    a. 10/2018 MRI L2-3 acute discitis osteomyelitis.   Endocarditis    a. 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   History of prostatectomy    Hyperlipidemia    Hypertension    Lumbar spinal stenosis    a. 10/2018  MRI: mild L2-3 and mod L3-4 spinal canal stenosis.   PAD (peripheral artery disease) ([place]) 06/06/2017   TIA (transient ischemic attack)    a. 10/2018   Vertebral artery occlusion, left    a. 10/2018 CTA head/neck: Occlusion of prox V4 segment of the nondominant L vertebral artery - unknown chronicity.    Patient Active Problem List   Diagnosis Date Noted   Sepsis ([place]) 06/11/2021   Paroxysmal A-fib ([place]) 01/06/2019   TIA (transient ischemic attack) 11/15/2018   Acute CVA (cerebrovascular accident) ([place]) 11/14/2018   CKD (chronic kidney disease), stage III ([place]) 11/14/2018   Diskitis 11/09/2018   Vertebral osteomyelitis ([place]) 11/09/2018   Acute lower UTI 09/26/2018   Bacteremia due to Enterococcus    AKI (acute kidney injury) ([place]) 09/25/2018   Hypertension 09/25/2018   History of atherosclerotic cardiovascular disease 09/25/2018   Bladder retention 09/25/2018   Chronic anemia 09/25/2018   Hyponatremia 09/25/2018   Pulmonary nodule 09/25/2018   PAD (peripheral artery disease) ([place]) 06/06/2017   Fatigue 04/26/2017   Claudication of both lower extremities ([place]) 04/26/2017   Cellulitis of left lower leg 01/08/2015   Cellulitis 01/08/2015   S/P AVR 01/08/2015   Chest discomfort 05/13/2014   Chest pain 05/04/2014   Diverticulosis 05/13/2013   Vertigo 05/13/2013   Colitis 06/23/2012   Hx of CABG 11/18/2010   History of prosthetic aortic valve 11/18/2010   Benign hypertensive heart disease without  heart failure 11/18/2010   Hypercholesterolemia 11/18/2010   History of prostate cancer 11/18/2010    Past Surgical History:  Procedure Laterality Date   CARDIAC CATHETERIZATION     EF of 66%   CARDIAC VALVE REPLACEMENT  2006   CORONARY ARTERY BYPASS GRAFT  1987   x5   I & D EXTREMITY Left 01/10/2015   Procedure: DEBRIDEMENT ANKLE PLACEMENT ANTIBIOTIC BEADS AND WOUND VAC;  Surgeon: [name], MD;  Location: [place];  Service: Orthopedics;  Laterality: Left;   IR LUMBAR DISC  ASPIRATION W/IMG GUIDE  11/10/2018   TEE WITHOUT CARDIOVERSION N/A 12/18/2018   Procedure: TRANSESOPHAGEAL ECHOCARDIOGRAM (TEE);  Surgeon: Jerline Pain, MD;  Location[facility] ENDOSCOPY;  Service: Cardiovascular;  Laterality: N/A;       Family History  Problem Relation Age of Onset   Heart disease Father    Heart failure Mother    Heart attack Brother    Congestive Heart Failure Sister    Congestive Heart Failure Sister     Social History   Tobacco Use   Smoking status: Former    Types: Cigarettes    Quit date: 11/18/1955    Years since quitting: 65.6   Smokeless tobacco: Never  Substance Use Topics   Alcohol use: No   Drug use: No    Home Medications Prior to Admission medications   Medication Sig Start Date End Date Taking? Authorizing Provider  acetaminophen (TYLENOL) 500 MG tablet Take 1,000 mg by mouth at bedtime.    [provider]  amLODipine (NORVASC) 5 MG tablet Take 1 tablet (5 mg total) by mouth daily. 07/30/20   Lelon Perla, MD  apixaban (ELIQUIS) 2.5 MG TABS tablet Take 1 tablet (2.5 mg total) by mouth 2 (two) times daily. 12/15/20 03/15/21  Lelon Perla, MD  atorvastatin (LIPITOR) 40 MG tablet Take 1 tablet (40 mg total) by mouth daily. 05/24/21   Lelon Perla, MD  Calcium Carbonate (CALCIUM 600 PO) Take 600 mg by mouth daily.    [provider]  Cholecalciferol (VITAMIN D-3 PO) Take 1 capsule by mouth daily.    [provider]  folic acid (FOLVITE) 979 MCG tablet Take 400 mcg by mouth 2 (two) times a week.    [provider]  Glucosamine HCl (GLUCOSAMINE PO) Take 1 tablet by mouth daily.    [provider]  HYDROcodone-acetaminophen (NORCO/VICODIN) 5-325 MG tablet Take 1-2 tablets by mouth every 4 (four) hours as needed for moderate pain. Patient taking differently: Take 1 tablet by mouth as needed for moderate pain. 12/10/18   Lelon Perla, MD  lisinopril (ZESTRIL) 20 MG tablet Take 1 tablet (20 mg total)  by mouth daily. 05/25/21   Lelon Perla, MD  loperamide (IMODIUM) 2 MG capsule Take 1 capsule (2 mg total) by mouth 4 (four) times daily as needed for diarrhea or loose stools. 03/13/19   Lelon Perla, MD  metoprolol succinate (TOPROL-XL) 100 MG 24 hr tablet TAKE 1 TABLET BY MOUTH DAILY. TAKE WITH OR IMMEDIATELY FOLLOWING A MEAL. GENERIC EQUIVALENT FOR TOPROL XL. 10/28/20   Lelon Perla, MD  Multiple Vitamin (THERAGRAN PO) Take 1 tablet by mouth daily.    [provider]  nitroGLYCERIN (NITROSTAT) 0.4 MG SL tablet Place 1 tablet (0.4 mg total) under the tongue every 5 (five) minutes as needed for chest pain. 03/13/19   Lelon Perla, MD  pantoprazole (PROTONIX) 40 MG tablet Take 40 mg by mouth daily. 09/14/20  [provider]  timolol (TIMOPTIC) 0.5 % ophthalmic solution Place 1 drop into the right eye 2 (two) times daily.  11/07/17   [provider]  vitamin C (ASCORBIC ACID) 500 MG tablet Take 500 mg by mouth daily.    [provider]  Wheat Dextrin (BENEFIBER) POWD Take by mouth as directed.    [provider]    Allergies    Patient has no known allergies.  Review of Systems   Review of Systems  Constitutional:  Positive for activity change, appetite change, chills and fatigue.  HENT:  Positive for congestion and rhinorrhea (chronic). Negative for sore throat.   Respiratory:  Positive for cough and shortness of breath.   Cardiovascular:  Negative for chest pain.  Gastrointestinal:  Positive for constipation (has eaten enough), nausea and vomiting (today). Negative for abdominal pain and diarrhea.  Genitourinary:  Positive for frequency and urgency.  Musculoskeletal:  Negative for back pain.  Skin:  Negative for rash.  Neurological:  Negative for headaches.   Physical Exam Updated Vital Signs BP 111/60 (BP Location: Right Arm)    Pulse 85    Temp (!) 100.5 F (38.1 C) (Oral)    Resp 20    Ht 5\' 7"  (1.702 m)    Wt 87.5 kg     SpO2 99%    BMI 30.20 kg/m   Physical Exam Vitals and nursing note reviewed.  Constitutional:      General: He is not in acute distress.    Appearance: He is well-developed. He is not diaphoretic.  HENT:     Head: Normocephalic and atraumatic.  Eyes:     Conjunctiva/sclera: Conjunctivae normal.  Cardiovascular:     Rate and Rhythm: Normal rate and regular rhythm.     Heart sounds: Normal heart sounds. No murmur heard.   No friction rub. No gallop.  Pulmonary:     Effort: Pulmonary effort is normal. Tachypnea present. No respiratory distress.     Breath sounds: Normal breath sounds. No wheezing or rales.  Abdominal:     General: There is no distension.     Palpations: Abdomen is soft.     Tenderness: There is no abdominal tenderness. There is no guarding.  Musculoskeletal:     Cervical back: Normal range of motion.  Skin:    General: Skin is warm and dry.  Neurological:     Mental Status: He is alert and oriented to person, place, and time.    ED Results / Procedures / Treatments   Labs (all labs ordered are listed, but only abnormal results are displayed) Labs Reviewed  LACTIC ACID, PLASMA - Abnormal; Notable for the following components:      Result Value   Lactic Acid, Venous 2.2 (*)    All other components within normal limits  LACTIC ACID, PLASMA - Abnormal; Notable for the following components:   Lactic Acid, Venous 2.4 (*)    All other components within normal limits  COMPREHENSIVE METABOLIC PANEL - Abnormal; Notable for the following components:   Sodium 130 (*)    CO2 20 (*)    Glucose, Bld 193 (*)    BUN 37 (*)    Creatinine, Ser 2.64 (*)    Calcium 8.4 (*)    GFR, Estimated 22 (*)    All other components within normal limits  CBC WITH DIFFERENTIAL/PLATELET - Abnormal; Notable for the following components:   WBC 13.8 (*)    RBC 3.66 (*)    Hemoglobin 11.4 (*)  HCT 33.8 (*)    Platelets 140 (*)    Neutro Abs 12.1 (*)    All other components within  normal limits  URINALYSIS, ROUTINE W REFLEX MICROSCOPIC - Abnormal; Notable for the following components:   APPearance CLOUDY (*)    Hgb urine dipstick MODERATE (*)    Protein, ur 100 (*)    Nitrite POSITIVE (*)    Leukocytes,Ua MODERATE (*)    All other components within normal limits  PROTIME-INR - Abnormal; Notable for the following components:   Prothrombin Time 21.5 (*)    INR 1.9 (*)    All other components within normal limits  APTT - Abnormal; Notable for the following components:   aPTT 37 (*)    All other components within normal limits  URINALYSIS, MICROSCOPIC (REFLEX) - Abnormal; Notable for the following components:   Bacteria, UA MANY (*)    All other components within normal limits  RESP PANEL BY RT-PCR (FLU A&B, COVID) ARPGX2  CULTURE, BLOOD (ROUTINE X 2)  CULTURE, BLOOD (ROUTINE X 2)  URINE CULTURE  CULTURE, BLOOD (ROUTINE X 2)  CULTURE, BLOOD (ROUTINE X 2)  CBC WITH DIFFERENTIAL/PLATELET  COMPREHENSIVE METABOLIC PANEL  LACTIC ACID, PLASMA  LACTIC ACID, PLASMA    EKG EKG Interpretation  Date/Time:  Saturday June 11 2021 20:46:43 EST Ventricular Rate:  121 PR Interval:  150 QRS Duration: 107 QT Interval:  301 QTC Calculation: 427 R Axis:   82 Text Interpretation: Sinus tachycardia Borderline right axis deviation Repolarization abnormality, prob rate related Artifact in lead(s) I II III aVR aVL aVF V1 V2 No significant change since last tracing Confirmed by Gareth Morgan [phone]) on 06/12/2021 1:14:31 AM  Radiology DG Chest Port 1 View  Result Date: 06/11/2021 CLINICAL DATA:  Fatigue for 10 days.  Question sepsis. EXAM: PORTABLE CHEST 1 VIEW COMPARISON:  11/02/2020 CT.  Most recent plain film 01/04/2019 FINDINGS: Prior median sternotomy. Remote right rib fractures. The Chin overlies the left apex. Patient rotated left. Normal heart size for level of inspiration. No pleural effusion or pneumothorax. Clear lungs. IMPRESSION: No acute cardiopulmonary  disease. Electronically Signed   By: Abigail Miyamoto M.D.   On: 06/11/2021 16:09    Procedures .Critical Care Performed by: Gareth Morgan, MD Authorized by: Gareth Morgan, MD   Critical care provider statement:    Critical care time (minutes):  30   Critical care was time spent personally by me on the following activities:  Development of treatment plan with patient or surrogate, evaluation of patient's response to treatment, examination of patient, ordering and review of laboratory studies, ordering and review of radiographic studies, ordering and performing treatments and interventions, pulse oximetry, re-evaluation of patient's condition and review of old charts   Medications Ordered in ED Medications  ceFEPIme (MAXIPIME) 2 g in sodium chloride 0.9 % 100 mL IVPB (has no administration in time range)  vancomycin (VANCOREADY) IVPB 750 mg/150 mL (has no administration in time range)  atorvastatin (LIPITOR) tablet 40 mg (has no administration in time range)  apixaban (ELIQUIS) tablet 2.5 mg (2.5 mg Oral Given 06/12/21 0040)  timolol (TIMOPTIC) 0.5 % ophthalmic solution 1 drop (1 drop Right Eye Patient Refused/Not Given 06/12/21 0040)  acetaminophen (TYLENOL) tablet 650 mg (650 mg Oral Given 06/12/21 0040)    Or  acetaminophen (TYLENOL) suppository 650 mg ( Rectal See Alternative 06/12/21 0040)  lactated ringers infusion ( Intravenous New Bag/Given 06/12/21 0033)  hydrALAZINE (APRESOLINE) injection 10 mg (has no administration in time range)  acetaminophen (  TYLENOL) tablet 650 mg (650 mg Oral Given 06/11/21 1526)  lactated ringers bolus 1,000 mL (0 mLs Intravenous Stopped 06/11/21 1714)  ceFEPIme (MAXIPIME) 2 g in sodium chloride 0.9 % 100 mL IVPB (0 g Intravenous Stopped 06/11/21 1648)  metroNIDAZOLE (FLAGYL) IVPB 500 mg (0 mg Intravenous Stopped 06/11/21 1811)  vancomycin (VANCOCIN) IVPB 1000 mg/200 mL premix (0 mg Intravenous Stopped 06/11/21 1928)  lactated ringers bolus 1,000 mL (0  mLs Intravenous Stopped 06/11/21 1811)  sodium chloride 0.9 % bolus 750 mL ( Intravenous Stopped 06/11/21 1928)    ED Course  I have reviewed the triage vital signs and the nursing notes.  Pertinent labs & imaging results that were available during my care of the patient were reviewed by me and considered in my medical decision making (see chart for details).    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  Also describes urinary frequency, with urinalysis consistent with urinary tract infection.  Presentation is most consistent with sepsis secondary to urinary tract infection, with urinary symptoms, leukocytosis, lactic acidosis.  Lactate continue to worsen with fluid resuscitation.  Will admit to the hospital for continued care of sepsis secondary to UTI      Final Clinical Impression(s) / ED Diagnoses Final diagnoses:  Sepsis without acute organ dysfunction, due to unspecified organism [facility])  Urinary tract infection without hematuria, site unspecified  AKI (acute kidney injury) ([place])  Lactic acidosis    Rx / DC Orders ED Discharge Orders     None        Gareth Morgan, MD 06/12/21 8309    Gareth Morgan, MD 07/01/21 1139

## 2021-06-12 ENCOUNTER — Inpatient Hospital Stay (HOSPITAL_COMMUNITY): Payer: Medicare Other

## 2021-06-12 DIAGNOSIS — N179 Acute kidney failure, unspecified: Secondary | ICD-10-CM

## 2021-06-12 DIAGNOSIS — R6520 Severe sepsis without septic shock: Secondary | ICD-10-CM

## 2021-06-12 DIAGNOSIS — N390 Urinary tract infection, site not specified: Secondary | ICD-10-CM

## 2021-06-12 DIAGNOSIS — A419 Sepsis, unspecified organism: Secondary | ICD-10-CM

## 2021-06-12 DIAGNOSIS — A4151 Sepsis due to Escherichia coli [E. coli]: Principal | ICD-10-CM

## 2021-06-12 LAB — CBC WITH DIFFERENTIAL/PLATELET
Abs Immature Granulocytes: 0.07 10*3/uL (ref 0.00–0.07)
Basophils Absolute: 0 10*3/uL (ref 0.0–0.1)
Basophils Relative: 0 %
Eosinophils Absolute: 0 10*3/uL (ref 0.0–0.5)
Eosinophils Relative: 0 %
HCT: 26.5 % — ABNORMAL LOW (ref 39.0–52.0)
Hemoglobin: 8.8 g/dL — ABNORMAL LOW (ref 13.0–17.0)
Immature Granulocytes: 1 %
Lymphocytes Relative: 7 %
Lymphs Abs: 0.9 10*3/uL (ref 0.7–4.0)
MCH: 31.2 pg (ref 26.0–34.0)
MCHC: 33.2 g/dL (ref 30.0–36.0)
MCV: 94 fL (ref 80.0–100.0)
Monocytes Absolute: 1.1 10*3/uL — ABNORMAL HIGH (ref 0.1–1.0)
Monocytes Relative: 9 %
Neutro Abs: 10.2 10*3/uL — ABNORMAL HIGH (ref 1.7–7.7)
Neutrophils Relative %: 83 %
Platelets: 111 10*3/uL — ABNORMAL LOW (ref 150–400)
RBC: 2.82 MIL/uL — ABNORMAL LOW (ref 4.22–5.81)
RDW: 13 % (ref 11.5–15.5)
WBC: 12.3 10*3/uL — ABNORMAL HIGH (ref 4.0–10.5)
nRBC: 0 % (ref 0.0–0.2)

## 2021-06-12 LAB — COMPREHENSIVE METABOLIC PANEL
ALT: 15 U/L (ref 0–44)
AST: 30 U/L (ref 15–41)
Albumin: 2.7 g/dL — ABNORMAL LOW (ref 3.5–5.0)
Alkaline Phosphatase: 47 U/L (ref 38–126)
Anion gap: 10 (ref 5–15)
BUN: 34 mg/dL — ABNORMAL HIGH (ref 8–23)
CO2: 22 mmol/L (ref 22–32)
Calcium: 8 mg/dL — ABNORMAL LOW (ref 8.9–10.3)
Chloride: 101 mmol/L (ref 98–111)
Creatinine, Ser: 2.72 mg/dL — ABNORMAL HIGH (ref 0.61–1.24)
GFR, Estimated: 21 mL/min — ABNORMAL LOW (ref >60)
Glucose, Bld: 155 mg/dL — ABNORMAL HIGH (ref 70–99)
Potassium: 3.9 mmol/L (ref 3.5–5.1)
Sodium: 133 mmol/L — ABNORMAL LOW (ref 135–145)
Total Bilirubin: 0.6 mg/dL (ref 0.3–1.2)
Total Protein: 5.1 g/dL — ABNORMAL LOW (ref 6.5–8.1)

## 2021-06-12 LAB — BLOOD CULTURE ID PANEL (REFLEXED) - BCID2

## 2021-06-12 LAB — LACTIC ACID, PLASMA
Lactic Acid, Venous: 1.2 mmol/L (ref 0.5–1.9)
Lactic Acid, Venous: 2.4 mmol/L (ref 0.5–1.9)

## 2021-06-12 MED ORDER — METOPROLOL SUCCINATE ER 100 MG PO TB24
100.0000 mg | ORAL_TABLET | Freq: Every day | ORAL | Status: DC
  Administered 2021-06-12: 11:00:00 100 mg via ORAL
  Filled 2021-06-12: qty 1

## 2021-06-12 MED ORDER — SACCHAROMYCES BOULARDII 250 MG PO CAPS
250.0000 mg | ORAL_CAPSULE | Freq: Two times a day (BID) | ORAL | Status: DC
  Administered 2021-06-12 – 2021-06-14 (×5): 250 mg via ORAL
  Filled 2021-06-12 (×6): qty 1

## 2021-06-12 MED ORDER — LACTATED RINGERS IV BOLUS
500.0000 mL | Freq: Once | INTRAVENOUS | Status: AC
  Administered 2021-06-12: 07:00:00 500 mL via INTRAVENOUS

## 2021-06-12 MED ORDER — LOPERAMIDE HCL 2 MG PO CAPS
2.0000 mg | ORAL_CAPSULE | ORAL | Status: DC | PRN
  Administered 2021-06-12 – 2021-06-13 (×5): 2 mg via ORAL
  Filled 2021-06-12 (×5): qty 1

## 2021-06-12 NOTE — Progress Notes (Addendum)
PHARMACY - PHYSICIAN COMMUNICATION CRITICAL VALUE ALERT - BLOOD CULTURE IDENTIFICATION (BCID)  Randy Chen is an 87 y.o. male who presented to [facility] on 06/11/2021 with a chief complaint of sepsis from UTI  Assessment:  1 out of 4 Blood cultures + for E coli (no resistance) Concerned for sepsis 2/2 UTI History of enterococcus bacteremia and discitis   Name of physician (or Provider) Contacted: Dr. Bonner Puna  Current antibiotics: Vancomcyin/cefepime  Changes to prescribed antibiotics recommended:  Continue current antibiotics with history of enterococcus bacteremia.   Plan to de-escalate in the 24-48 hours when cultures result   Results for orders placed or performed during the hospital encounter of 06/11/21  Blood Culture ID Panel (Reflexed) (Collected: 06/11/2021  3:44 PM)  Result Value Ref Range   Enterococcus faecalis NOT DETECTED NOT DETECTED   Enterococcus Faecium NOT DETECTED NOT DETECTED   Listeria monocytogenes NOT DETECTED NOT DETECTED   Staphylococcus species NOT DETECTED NOT DETECTED   Staphylococcus aureus (BCID) NOT DETECTED NOT DETECTED   Staphylococcus epidermidis NOT DETECTED NOT DETECTED   Staphylococcus lugdunensis NOT DETECTED NOT DETECTED   Streptococcus species NOT DETECTED NOT DETECTED   Streptococcus agalactiae NOT DETECTED NOT DETECTED   Streptococcus pneumoniae NOT DETECTED NOT DETECTED   Streptococcus pyogenes NOT DETECTED NOT DETECTED   A.calcoaceticus-baumannii NOT DETECTED NOT DETECTED   Bacteroides fragilis NOT DETECTED NOT DETECTED   Enterobacterales DETECTED (A) NOT DETECTED   Enterobacter cloacae complex NOT DETECTED NOT DETECTED   Escherichia coli DETECTED (A) NOT DETECTED   Klebsiella aerogenes NOT DETECTED NOT DETECTED   Klebsiella oxytoca NOT DETECTED NOT DETECTED   Klebsiella pneumoniae NOT DETECTED NOT DETECTED   Proteus species NOT DETECTED NOT DETECTED   Salmonella species NOT DETECTED NOT DETECTED   Serratia marcescens NOT  DETECTED NOT DETECTED   Haemophilus influenzae NOT DETECTED NOT DETECTED   Neisseria meningitidis NOT DETECTED NOT DETECTED   Pseudomonas aeruginosa NOT DETECTED NOT DETECTED   Stenotrophomonas maltophilia NOT DETECTED NOT DETECTED   Candida albicans NOT DETECTED NOT DETECTED   Candida auris NOT DETECTED NOT DETECTED   Candida glabrata NOT DETECTED NOT DETECTED   Candida krusei NOT DETECTED NOT DETECTED   Candida parapsilosis NOT DETECTED NOT DETECTED   Candida tropicalis NOT DETECTED NOT DETECTED   Cryptococcus neoformans/gattii NOT DETECTED NOT DETECTED   CTX-M ESBL NOT DETECTED NOT DETECTED   Carbapenem resistance IMP NOT DETECTED NOT DETECTED   Carbapenem resistance KPC NOT DETECTED NOT DETECTED   Carbapenem resistance NDM NOT DETECTED NOT DETECTED   Carbapenem resist OXA 48 LIKE NOT DETECTED NOT DETECTED   Carbapenem resistance VIM NOT DETECTED NOT DETECTED   Thank you for allowing pharmacy to be a part of this patients care.  Donnald Garre, PharmD Clinical Pharmacist  Please check AMION for all [place] numbers After 10:00 PM, call [place] [phone]

## 2021-06-12 NOTE — H&P (Addendum)
History and Physical    LAWARENCE Chen MEQ:[id number] DOB: [birth year] DOA: 06/11/2021  PCP: Ginger Organ., MD  Patient coming from: Home.  Chief Complaint: Fever and weakness.  HPI: Randy Chen is a 87 y.o. male with history of CAD status post CABG, paroxysmal atrial fibrillation, peripheral vascular disease, bioprosthetic aortic valve replacement who has had a history of enterococcal bacteremia and discitis in 2020 treated empirically with antibiotics at that time 2D echo and TEE did not show any involvement of the aortic valve has been experiencing fatigue weakness subjective feeling of fever and chills at home and also has been having increased urinary frequency.  Since the symptoms persisted patient decided to come to the ER.  Denies any productive cough chest pain nausea vomiting abdominal pain diarrhea.  Does not have any back pain.  ED Course: In the ER patient was febrile with temperature 100.5 F blood pressure was in the 90s initially.  Labs show lactic acid of 2.2 WBC of 13.8 creatinine of 2.6 blood glucose of 193 platelets of 140.  INR 1.9.  COVID test is negative UA is concerning for UTI.  Patient was started on fluids empiric antibiotics admitted for sepsis secondary to UTI.  Review of Systems: As per HPI, rest all negative.   Past Medical History:  Diagnosis Date   Aortic stenosis    a. 2006 s/p bioprosthetic AVR; b. 12/2018 TEE: EF 60-65%, Nl AoV fxn w/o dehiscence, regurgitation, perivalvular leak, or abscess.   Bacteremia due to Enterococcus 09/2018   a. ? discitis vs SBE; b. 12/2018 TEE: No evidence of vegetation, nl fxn'ing bioprosthetic AoV.   Cancer [facility])    Carotid arterial disease ([place])    a. 10/2018 Carotid U/S: 1-39% bilat ICA dzs.   Coronary artery disease    a. 1987 s/p CABG x 5; 05/2014 MV: EF 70%, no significant ischemia.   Discitis of lumbar region    a. 10/2018 MRI L2-3 acute discitis osteomyelitis.   Endocarditis    a. 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   History of prostatectomy    Hyperlipidemia    Hypertension    Lumbar spinal stenosis    a. 10/2018 MRI: mild L2-3 and mod L3-4 spinal canal stenosis.   PAD (peripheral artery disease) ([place]) 06/06/2017   TIA (transient ischemic attack)    a. 10/2018   Vertebral artery occlusion, left    a. 10/2018 CTA head/neck: Occlusion of prox V4 segment of the nondominant L vertebral artery - unknown chronicity.    Past Surgical History:  Procedure Laterality Date   CARDIAC CATHETERIZATION     EF of 66%   CARDIAC VALVE REPLACEMENT  2006   CORONARY ARTERY BYPASS GRAFT  1987   x5   I & D EXTREMITY Left 01/10/2015   Procedure: DEBRIDEMENT ANKLE PLACEMENT ANTIBIOTIC BEADS AND WOUND VAC;  Surgeon: [name], MD;  Location: [facility];  Service: Orthopedics;  Laterality: Left;   IR LUMBAR DISC ASPIRATION W/IMG GUIDE  11/10/2018   TEE WITHOUT CARDIOVERSION N/A 12/18/2018   Procedure: TRANSESOPHAGEAL ECHOCARDIOGRAM (TEE);  Surgeon: Jerline Pain, MD;  Location[facility] ENDOSCOPY;  Service: Cardiovascular;  Laterality: N/A;     reports that he quit smoking about 65 years ago. His smoking use included cigarettes. He has never used smokeless tobacco. He reports that he does not drink alcohol and does not use drugs.  No Known Allergies  Family History  Problem Relation Age  of Onset   Heart disease Father    Heart failure Mother    Heart attack Brother    Congestive Heart Failure Sister    Congestive Heart Failure Sister     Prior to Admission medications   Medication Sig Start Date End Date Taking? Authorizing Provider  acetaminophen (TYLENOL) 500 MG tablet Take 1,000 mg by mouth at bedtime.    [provider]  amLODipine (NORVASC) 5 MG tablet Take 1 tablet (5 mg total) by mouth daily. 07/30/20   Lelon Perla, MD  apixaban (ELIQUIS) 2.5 MG TABS tablet Take 1 tablet (2.5 mg total) by mouth 2 (two)  times daily. 12/15/20 03/15/21  Lelon Perla, MD  atorvastatin (LIPITOR) 40 MG tablet Take 1 tablet (40 mg total) by mouth daily. 05/24/21   Lelon Perla, MD  Calcium Carbonate (CALCIUM 600 PO) Take 600 mg by mouth daily.    [provider]  Cholecalciferol (VITAMIN D-3 PO) Take 1 capsule by mouth daily.    [provider]  folic acid (FOLVITE) 299 MCG tablet Take 400 mcg by mouth 2 (two) times a week.    [provider]  Glucosamine HCl (GLUCOSAMINE PO) Take 1 tablet by mouth daily.    [provider]  HYDROcodone-acetaminophen (NORCO/VICODIN) 5-325 MG tablet Take 1-2 tablets by mouth every 4 (four) hours as needed for moderate pain. Patient taking differently: Take 1 tablet by mouth as needed for moderate pain. 12/10/18   Lelon Perla, MD  lisinopril (ZESTRIL) 20 MG tablet Take 1 tablet (20 mg total) by mouth daily. 05/25/21   Lelon Perla, MD  loperamide (IMODIUM) 2 MG capsule Take 1 capsule (2 mg total) by mouth 4 (four) times daily as needed for diarrhea or loose stools. 03/13/19   Lelon Perla, MD  metoprolol succinate (TOPROL-XL) 100 MG 24 hr tablet TAKE 1 TABLET BY MOUTH DAILY. TAKE WITH OR IMMEDIATELY FOLLOWING A MEAL. GENERIC EQUIVALENT FOR TOPROL XL. 10/28/20   Lelon Perla, MD  Multiple Vitamin (THERAGRAN PO) Take 1 tablet by mouth daily.    [provider]  nitroGLYCERIN (NITROSTAT) 0.4 MG SL tablet Place 1 tablet (0.4 mg total) under the tongue every 5 (five) minutes as needed for chest pain. 03/13/19   Lelon Perla, MD  pantoprazole (PROTONIX) 40 MG tablet Take 40 mg by mouth daily. 09/14/20   [provider]  timolol (TIMOPTIC) 0.5 % ophthalmic solution Place 1 drop into the right eye 2 (two) times daily.  11/07/17   [provider]  vitamin C (ASCORBIC ACID) 500 MG tablet Take 500 mg by mouth daily.    [provider]  Wheat Dextrin (BENEFIBER) POWD Take by mouth as directed.    [provider]    Physical Exam: Constitutional: Moderately built and nourished. Vitals:   06/11/21 2245 06/12/21 0232 06/12/21 0449 06/12/21 0528  BP: 111/60  (!) 93/38 124/61  Pulse: 85  (!) 56 62  Resp: 20  20   Temp: (!) 100.5 F (38.1 C)  98.1 F (36.7 C)   TempSrc: Oral  Oral   SpO2: 99%  100% 100%  Weight: 87.5 kg 87.5 kg    Height: 5\' 7"  (1.702 m)      Eyes: Anicteric no pallor. ENMT: No discharge from the ears eyes nose and mouth. Neck: No mass felt.  No neck rigidity. Respiratory: No rhonchi or crepitations. Cardiovascular: S1-S2 heard. Abdomen: Soft nontender bowel sound present. Musculoskeletal: No edema. Skin: No  rash. Neurologic: Alert awake oriented to time place and person.  Moves all extremities. Psychiatric: Appears normal.  Normal affect.   Labs on Admission: I have personally reviewed following labs and imaging studies  CBC: Recent Labs  Lab 06/11/21 1543 06/12/21 0216  WBC 13.8* 12.3*  NEUTROABS 12.1* 10.2*  HGB 11.4* 8.8*  HCT 33.8* 26.5*  MCV 92.3 94.0  PLT 140* 778*   Basic Metabolic Panel: Recent Labs  Lab 06/11/21 1543 06/12/21 0216  NA 130* 133*  K 4.3 3.9  CL 98 101  CO2 20* 22  GLUCOSE 193* 155*  BUN 37* 34*  CREATININE 2.64* 2.72*  CALCIUM 8.4* 8.0*   GFR: Estimated Creatinine Clearance: 18.7 mL/min (A) (by C-G formula based on SCr of 2.72 mg/dL (H)). Liver Function Tests: Recent Labs  Lab 06/11/21 1543 06/12/21 0216  AST 31 30  ALT 15 15  ALKPHOS 60 47  BILITOT 1.0 0.6  PROT 7.0 5.1*  ALBUMIN 3.7 2.7*   No results for input(s): LIPASE, AMYLASE in the last 168 hours. No results for input(s): AMMONIA in the last 168 hours. Coagulation Profile: Recent Labs  Lab 06/11/21 1610  INR 1.9*   Cardiac Enzymes: No results for input(s): CKTOTAL, CKMB, CKMBINDEX, TROPONINI in the last 168 hours. BNP (last 3 results) No results for input(s): PROBNP in the last 8760 hours. HbA1C: No results for input(s): HGBA1C in  the last 72 hours. CBG: No results for input(s): GLUCAP in the last 168 hours. Lipid Profile: No results for input(s): CHOL, HDL, LDLCALC, TRIG, CHOLHDL, LDLDIRECT in the last 72 hours. Thyroid Function Tests: No results for input(s): TSH, T4TOTAL, FREET4, T3FREE, THYROIDAB in the last 72 hours. Anemia Panel: No results for input(s): VITAMINB12, FOLATE, FERRITIN, TIBC, IRON, RETICCTPCT in the last 72 hours. Urine analysis:    Component Value Date/Time   COLORURINE YELLOW 06/11/2021 1745   APPEARANCEUR CLOUDY (A) 06/11/2021 1745   LABSPEC 1.025 06/11/2021 1745   PHURINE 5.5 06/11/2021 1745   GLUCOSEU NEGATIVE 06/11/2021 1745   HGBUR MODERATE (A) 06/11/2021 1745   BILIRUBINUR NEGATIVE 06/11/2021 1745   KETONESUR NEGATIVE 06/11/2021 1745   PROTEINUR 100 (A) 06/11/2021 1745   UROBILINOGEN 0.2 01/09/2015 1853   NITRITE POSITIVE (A) 06/11/2021 1745   LEUKOCYTESUR MODERATE (A) 06/11/2021 1745   Sepsis Labs: @LABRCNTIP (procalcitonin:4,lacticidven:4) ) Recent Results (from the past 240 hour(s))  Resp Panel by RT-PCR (Flu A&B, Covid) Nasopharyngeal Swab     Status: None   Collection Time: 06/11/21  3:30 PM   Specimen: Nasopharyngeal Swab; Nasopharyngeal(NP) swabs in vial transport medium  Result Value Ref Range Status   SARS Coronavirus 2 by RT PCR NEGATIVE NEGATIVE Final    Comment: (NOTE) SARS-CoV-2 target nucleic acids are NOT DETECTED.  The SARS-CoV-2 RNA is generally detectable in upper respiratory specimens during the acute phase of infection. The lowest concentration of SARS-CoV-2 viral copies this assay can detect is 138 copies/mL. A negative result does not preclude SARS-Cov-2 infection and should not be used as the sole basis for treatment or other patient management decisions. A negative result may occur with  improper specimen collection/handling, submission of specimen other than nasopharyngeal swab, presence of viral mutation(s) within the areas targeted by this  assay, and inadequate number of viral copies(<138 copies/mL). A negative result must be combined with clinical observations, patient history, and epidemiological information. The expected result is Negative.  Fact Sheet for Patients:  EntrepreneurPulse.com.au  Fact Sheet for Healthcare Providers:  IncredibleEmployment.be  This test is no t yet  approved or cleared by the [place] and  has been authorized for detection and/or diagnosis of SARS-CoV-2 by FDA under an Emergency Use Authorization (EUA). This EUA will remain  in effect (meaning this test can be used) for the duration of the COVID-19 declaration under Section 564(b)(1) of the Act, 21 U.S.C.section 360bbb-3(b)(1), unless the authorization is terminated  or revoked sooner.       Influenza A by PCR NEGATIVE NEGATIVE Final   Influenza B by PCR NEGATIVE NEGATIVE Final    Comment: (NOTE) The Xpert Xpress SARS-CoV-2/FLU/RSV plus assay is intended as an aid in the diagnosis of influenza from Nasopharyngeal swab specimens and should not be used as a sole basis for treatment. Nasal washings and aspirates are unacceptable for Xpert Xpress SARS-CoV-2/FLU/RSV testing.  Fact Sheet for Patients: EntrepreneurPulse.com.au  Fact Sheet for Healthcare Providers: IncredibleEmployment.be  This test is not yet approved or cleared by the [place] FDA and has been authorized for detection and/or diagnosis of SARS-CoV-2 by FDA under an Emergency Use Authorization (EUA). This EUA will remain in effect (meaning this test can be used) for the duration of the COVID-19 declaration under Section 564(b)(1) of the Act, 21 U.S.C. section 360bbb-3(b)(1), unless the authorization is terminated or revoked.  Performed at [facility], [address] [place], [place] [address]      Radiological Exams on Admission: DG Chest [facility] 1 View  Result Date:  06/11/2021 CLINICAL DATA:  Fatigue for 10 days.  Question sepsis. EXAM: PORTABLE CHEST 1 VIEW COMPARISON:  11/02/2020 CT.  Most recent plain film 01/04/2019 FINDINGS: Prior median sternotomy. Remote right rib fractures. The Chin overlies the left apex. Patient rotated left. Normal heart size for level of inspiration. No pleural effusion or pneumothorax. Clear lungs. IMPRESSION: No acute cardiopulmonary disease. Electronically Signed   By: Abigail Miyamoto M.D.   On: 06/11/2021 16:09    EKG: Independently reviewed.  Sinus tachycardia.  Assessment/Plan Principal Problem:   Sepsis ([place]) Active Problems:   Hx of CABG   History of prostate cancer   S/P AVR   Acute lower UTI   CKD (chronic kidney disease), stage III (HCC)   Paroxysmal A-fib (HCC)    Sepsis likely from UTI -at presentation patient was tachycardic hypotensive febrile elevated lactic acid consistent with sepsis physiology.  We will continue empiric antibiotics follow cultures.  Note that patient has had Enterobacter bacteremia and discitis in 2020.  Patient also has a bioprosthetic valve. Acute on chronic disease stage III creatinine has gradually worsened when compared to the one done in March 2021.  In March 2022 it was 2.2 now it is around 2.6.  I will hold off patient's lisinopril and also continue with hydration.  We will check CT renal study.  Follow intake output. Hypertension holding lisinopril amlodipine due to hypotension at presentation.  Will restart with metoprolol if blood pressure improves.  Closely follow blood pressure trends. CAD status post CABG denies any chest pain.  On Eliquis statins and beta-blockers. Status post bioprosthetic aortic valve.  Follow cultures. Thrombocytopenia could be from sepsis process.  If any further worsening will need further work-up. Hyperglycemia -check hemoglobin A1c. History of prostate cancer.  Since patient has sepsis at presentation will need close monitoring and inpatient  status.   DVT prophylaxis: Eliquis. Code Status: DNR as confirmed with patient. Family Communication: Discussed with patient. Disposition Plan: Home. Consults called: None. Admission status: Inpatient.   Rise Patience MD [facility] Pager [phone].  If 7PM-7AM, please contact night-coverage [email or web address] Password [facility]  06/12/2021, 6:06 AM

## 2021-06-12 NOTE — Progress Notes (Signed)
PROGRESS NOTE  Brief Narrative: Randy Chen is a 87 y.o. male with a history of bioprosthetic AVR, enterococcal bacteremia and discitis in 2020, CAD s/p CABG, PAF on eliquis, and PVD who presented to the ED 12/24 with fever, chills, urinary frequency worsening for a couple days in the setting of fatigue/malaise and intermittent chills/night sweats for weeks. He has no back pain. On arrival, 100.28F and hypotensive with leukocytosis (WBC 13.8k), lactic acid 2.2, and pyuria/bacteriuria on urinalysis. Broad antibiotics and IV fluids were given and the patient admitted for sepsis due to UTI this morning by Dr. Hal Hope. A blood culture from the ED has returned with E. coli.    Subjective: Feels better than at admission, but still shivering at times. Having some loose stools but no abd pain or blood in them. Says this happens with abx frequently, never heard of C. diff.   Objective: BP 124/61    Pulse 62    Temp 98.1 F (36.7 C) (Oral)    Resp 20    Ht 5\' 7"  (1.702 m)    Wt 87.5 kg    SpO2 100%    BMI 30.20 kg/m   Gen: Nontoxic elderly male in no distress Pulm: Clear and nonlabored on room air  CV: RRR, no murmur, no JVD, no edema GI: Soft, NT, ND, +BS  Neuro: Alert and oriented. No focal deficits. Skin: No rashes, lesions or ulcers on visualized skin  Assessment & Plan: Sepsis with lactic acidosis due to E. coli bacteremia due to UTI:  - Continue broad coverage with vancomycin and cefepime for now due to history of Enterococcus bacteremia/discitis. Once blood cultures reveal susceptibility and the patient shows sustained improvement (still with lactic acid elevation and shivering), we will narrow to treat E. coli, and if culture shows no Enterococcus at 48 hours, can DC vancomycin.  - Check echo due to history of AVR and bacteremia, albeit gram negative.  - Holding antihypertensives due to hypotension which has improved. Will restart as indicated. With PAF, would restart beta blocker  first.   CAD s/p CABG, PAF: No anginal complaints.  - Continue statin, beta blocker as above.  - Continue eliquis for right now, though IVF has hemodiluted cell lines and Hgb down to 8.8. Low threshold to stop AC if continues downward trend, though no active bleeding at this time.   Thrombocytopenia: Acute due to sepsis. Similar but less severe than prior sepsis admission. - Monitor  Anemia of CKD: Presumed diagnosis. Note drop in hgb w/volume resuscitation to 8.8. No bleeding. Pt's hgb at previous admission was as low as 7.7, discharged at 9.2, so was hemoconcentrated at this admission.  - Monitor closely.   AKI on CKD stage IIIa: Unclear baseline with values 1.8 in 2021, 2.2 earlier this year.  - Hold lisinopril, monitor BMP, DC vancomycin as soon as possible.   Antibiotic-associated diarrhea: Benign exam, no bleeding. Has hx of same.  - Probiotics, prn loperamide (a home med of his)  DNR: POA.   History of prostate CA: Noted.   Patrecia Pour, MD Pager on amion 06/12/2021, 9:55 AM

## 2021-06-13 ENCOUNTER — Inpatient Hospital Stay (HOSPITAL_COMMUNITY): Payer: Medicare Other

## 2021-06-13 DIAGNOSIS — Z8546 Personal history of malignant neoplasm of prostate: Secondary | ICD-10-CM

## 2021-06-13 DIAGNOSIS — Z952 Presence of prosthetic heart valve: Secondary | ICD-10-CM

## 2021-06-13 DIAGNOSIS — N183 Chronic kidney disease, stage 3 unspecified: Secondary | ICD-10-CM

## 2021-06-13 DIAGNOSIS — I480 Paroxysmal atrial fibrillation: Secondary | ICD-10-CM

## 2021-06-13 DIAGNOSIS — R7881 Bacteremia: Secondary | ICD-10-CM

## 2021-06-13 DIAGNOSIS — Z951 Presence of aortocoronary bypass graft: Secondary | ICD-10-CM

## 2021-06-13 LAB — CBC WITH DIFFERENTIAL/PLATELET
Abs Immature Granulocytes: 0.04 10*3/uL (ref 0.00–0.07)
Basophils Absolute: 0 10*3/uL (ref 0.0–0.1)
Basophils Relative: 0 %
Eosinophils Absolute: 0.1 10*3/uL (ref 0.0–0.5)
Eosinophils Relative: 1 %
HCT: 28.3 % — ABNORMAL LOW (ref 39.0–52.0)
Hemoglobin: 9.4 g/dL — ABNORMAL LOW (ref 13.0–17.0)
Immature Granulocytes: 1 %
Lymphocytes Relative: 20 %
Lymphs Abs: 1.6 10*3/uL (ref 0.7–4.0)
MCH: 31.2 pg (ref 26.0–34.0)
MCHC: 33.2 g/dL (ref 30.0–36.0)
MCV: 94 fL (ref 80.0–100.0)
Monocytes Absolute: 1 10*3/uL (ref 0.1–1.0)
Monocytes Relative: 13 %
Neutro Abs: 5 10*3/uL (ref 1.7–7.7)
Neutrophils Relative %: 65 %
Platelets: 100 10*3/uL — ABNORMAL LOW (ref 150–400)
RBC: 3.01 MIL/uL — ABNORMAL LOW (ref 4.22–5.81)
RDW: 12.8 % (ref 11.5–15.5)
WBC: 7.7 10*3/uL (ref 4.0–10.5)
nRBC: 0 % (ref 0.0–0.2)

## 2021-06-13 LAB — LACTIC ACID, PLASMA: Lactic Acid, Venous: 3.2 mmol/L (ref 0.5–1.9)

## 2021-06-13 LAB — ECHOCARDIOGRAM COMPLETE
AR max vel: 1.67 cm2
AV Area VTI: 1.91 cm2
AV Area mean vel: 1.65 cm2
AV Mean grad: 7 mmHg
AV Peak grad: 13.1 mmHg
Ao pk vel: 1.81 m/s
Area-P 1/2: 4.39 cm2
Height: 67 in
S' Lateral: 2.4 cm
Weight: 3100.55 oz

## 2021-06-13 LAB — BASIC METABOLIC PANEL
Anion gap: 11 (ref 5–15)
BUN: 28 mg/dL — ABNORMAL HIGH (ref 8–23)
CO2: 22 mmol/L (ref 22–32)
Calcium: 8.1 mg/dL — ABNORMAL LOW (ref 8.9–10.3)
Chloride: 102 mmol/L (ref 98–111)
Creatinine, Ser: 2.32 mg/dL — ABNORMAL HIGH (ref 0.61–1.24)
GFR, Estimated: 26 mL/min — ABNORMAL LOW (ref >60)
Glucose, Bld: 107 mg/dL — ABNORMAL HIGH (ref 70–99)
Potassium: 3.6 mmol/L (ref 3.5–5.1)
Sodium: 135 mmol/L (ref 135–145)

## 2021-06-13 LAB — HEMOGLOBIN A1C
Hgb A1c MFr Bld: 5.7 % — ABNORMAL HIGH (ref 4.8–5.6)
Mean Plasma Glucose: 116.89 mg/dL

## 2021-06-13 MED ORDER — ALUM & MAG HYDROXIDE-SIMETH 200-200-20 MG/5ML PO SUSP: 30.0000 mL | Freq: Four times a day (QID) | ORAL | Status: DC | PRN

## 2021-06-13 MED ORDER — METOPROLOL SUCCINATE ER 25 MG PO TB24
25.0000 mg | ORAL_TABLET | Freq: Every day | ORAL | Status: DC
  Administered 2021-06-13 – 2021-06-14 (×2): 25 mg via ORAL
  Filled 2021-06-13 (×2): qty 1

## 2021-06-13 MED ORDER — SODIUM CHLORIDE 0.9 % IV SOLN
2.0000 g | INTRAVENOUS | Status: DC
  Administered 2021-06-13: 18:00:00 2 g via INTRAVENOUS
  Filled 2021-06-13: qty 20

## 2021-06-13 NOTE — Evaluation (Addendum)
Physical Therapy Evaluation Patient Details Name: Randy Chen MRN: [id number] DOB: 1927/10/13 Today's Date: 06/13/2021  History of Present Illness  87 y.o. male presents to ED 06/11/21 with  fatigue weakness subjective feeling of fever and chills at home and also has been having increased urinary frequency. Found to be febrile, with BP in 90s, UA concerning for UTI. Admitted for treatment of UTI. PMH:CAD status post CABG, paroxysmal atrial fibrillation, peripheral vascular disease, bioprosthetic aortic valve replacement who has had a history of enterococcal bacteremia and discitis in 2020  Clinical Impression  PTA pt living with wife at [facility], where he was independent with mobility and ADLs. Pt reports long distance ambulation with his Rollator to participate in water aerobics and chair exercise class. Pt able to ambulate in hallway with RW and supervision, however HR elevates to high 120s and pt experiences 3/4 DoE. PT encouraged wife to lead pt is some of the chair exercises that they do in his their class hourly to assist in building up his endurance.  PT recommending HHPT for return to sport. PT will continue to follow acutely.        Recommendations for follow up therapy are one component of a multi-disciplinary discharge planning process, led by the attending physician.  Recommendations may be updated based on patient status, additional functional criteria and insurance authorization.  Follow Up Recommendations Home health PT    Assistance Recommended at Discharge Intermittent Supervision/Assistance  Functional Status Assessment Patient has had a recent decline in their functional status and demonstrates the ability to make significant improvements in function in a reasonable and predictable amount of time.  Equipment Recommendations  None recommended by PT    Recommendations for Other Services       Precautions / Restrictions        Mobility  Bed Mobility                General bed mobility comments: sitting up in recliner on entry    Transfers Overall transfer level: Modified independent                      Ambulation/Gait Ambulation/Gait assistance: Supervision;Modified independent (Device/Increase time) Gait Distance (Feet): 300 Feet Assistive device: Rolling walker (2 wheels);None Gait Pattern/deviations: Step-through pattern;Decreased step length - right;Decreased step length - left;Shuffle;Wide base of support Gait velocity: slowed Gait velocity interpretation: <1.8 ft/sec, indicate of risk for recurrent falls   General Gait Details: pt initially ambulates without AD but requires min guard as he is reaching for furniture for support, provided with RW and able to ambulate with supervision, trialed ambulation without AD again and continues to require min guard. Recommend pt utilize his cane initially when he returns home.         Balance Overall balance assessment: Mild deficits observed, not formally tested                                           Pertinent Vitals/Pain Pain Assessment: No/denies pain    Home Living Family/patient expects to be discharged to:: Private residence (independent Living at [facility]) Living Arrangements: Spouse/significant other Available Help at Discharge: Family Type of Home: Independent living facility Home Access: Level entry       Home Layout: One level Home Equipment: Conservation officer, nature (2 wheels);Rollator (4 wheels);Cane - single point;Grab bars - toilet;Grab bars - tub/shower;Shower seat - built  in      Prior Function Prior Level of Function : Independent/Modified Independent             Mobility Comments: participates in water aerobics and chair exercises       Hand Dominance   Dominant Hand: Right    Extremity/Trunk Assessment   Upper Extremity Assessment Upper Extremity Assessment: Generalized weakness    Lower Extremity Assessment Lower  Extremity Assessment: Generalized weakness    Cervical / Trunk Assessment Cervical / Trunk Assessment: Kyphotic  Communication   Communication: No difficulties  Cognition Arousal/Alertness: Awake/alert Behavior During Therapy: WFL for tasks assessed/performed Overall Cognitive Status: Within Functional Limits for tasks assessed                                          General Comments General comments (skin integrity, edema, etc.): HR elevated to high 120s with ambulation, SpO2 on RA with ambulation 97%O2, pt noted to have 3/4 DoE with ambulation        Assessment/Plan    PT Assessment Patient needs continued PT services  PT Problem List Decreased strength;Decreased activity tolerance;Decreased balance;Decreased mobility;Cardiopulmonary status limiting activity       PT Treatment Interventions Gait training;Functional mobility training;Therapeutic activities;Therapeutic exercise;Balance training;Cognitive remediation;Patient/family education    PT Goals (Current goals can be found in the Care Plan section)  Acute Rehab PT Goals Patient Stated Goal: get back to his exercise routine PT Goal Formulation: With patient Time For Goal Achievement: 06/27/21 Potential to Achieve Goals: Good    Frequency Min 3X/week    AM-PAC PT "6 Clicks" Mobility  Outcome Measure Help needed turning from your back to your side while in a flat bed without using bedrails?: None Help needed moving from lying on your back to sitting on the side of a flat bed without using bedrails?: None Help needed moving to and from a bed to a chair (including a wheelchair)?: None Help needed standing up from a chair using your arms (e.g., wheelchair or bedside chair)?: None Help needed to walk in hospital room?: A Little Help needed climbing 3-5 steps with a railing? : A Little 6 Click Score: 22    End of Session Equipment Utilized During Treatment: Gait belt Activity Tolerance: Patient  tolerated treatment well Patient left: in chair;with family/visitor present Nurse Communication: Mobility status PT Visit Diagnosis: Unsteadiness on feet (R26.81);Muscle weakness (generalized) (M62.81);Other abnormalities of gait and mobility (R26.89)    Time: 3825-0539 PT Time Calculation (min) (ACUTE ONLY): 21 min   Charges:   PT Evaluation $PT Eval Moderate Complexity: 1 Mod          Jedediah Noda B. Migdalia Dk PT, DPT Acute Rehabilitation Services Pager [phone] Office [phone]   [place] 06/13/2021, 11:50 AM

## 2021-06-13 NOTE — Progress Notes (Signed)
PT Cancellation Note  Patient Details Name: Randy Chen MRN: [id number] DOB: 09/07/1927   Cancelled Treatment:    Reason Eval/Treat Not Completed: (P) Patient at procedure or test/unavailable Pt is off floor for ECHO. PT will follow back when he returns.  Marlenne Ridge B. Migdalia Dk PT, DPT Acute Rehabilitation Services Pager [phone] Office [phone]    [place] 06/13/2021, 9:30 AM

## 2021-06-13 NOTE — Progress Notes (Signed)
PROGRESS NOTE  Randy Chen  JQB:[id number] DOB: Dec 03, 1927 DOA: 06/11/2021 PCP: Randy Chen., MD   Brief Narrative: Randy Chen is a 87 y.o. male with a history of bioprosthetic AVR, enterococcal bacteremia and discitis in 2020, CAD s/p CABG, PAF on eliquis, and PVD who presented to the ED 12/24 with fever, chills, urinary frequency worsening for a couple days in the setting of fatigue/malaise and intermittent chills/night sweats for weeks. He has no back pain. On arrival, 100.56F and hypotensive with leukocytosis (WBC 13.8k), lactic acid 2.2, and pyuria/bacteriuria on urinalysis. Broad antibiotics and IV fluids were given and the patient admitted for sepsis due to UTI, subsequently blood cultures and urine culture growing E. coli. Antibiotics narrowed to ceftriaxone.   Assessment & Plan: Principal Problem:   Sepsis ([place]) Active Problems:   Hx of CABG   History of prostate cancer   S/P AVR   Acute lower UTI   CKD (chronic kidney disease), stage III (HCC)   Paroxysmal A-fib (HCC)  Sepsis with lactic acidosis due to E. coli bacteremia due to UTI: No complication noted on CT renal stone study. - Blood cultures without gram positive on gram stain, no additional growth over ~48 hours. Can DC vancomycin. Change to ceftriaxone pending susceptibility testing. without additional Continue broad coverage with vancomycin and cefepime for now due to history of Enterococcus bacteremia/discitis. Once blood cultures reveal susceptibility and the patient shows sustained improvement (still with lactic acid elevation and shivering), we will narrow to treat E. coli, and if culture shows no Enterococcus at 48 hours, can DC vancomycin.  - No vegetations on TTE.  PAF, HTN: Hypotensive and bradycardic after restart of metoprolol 100mg  yesterday. - Will decrease to 25mg  today and monitor.   CAD s/p CABG, PAF: No anginal complaints.  - Continue statin, beta blocker as above.  - Continue eliquis     Thrombocytopenia: Acute due to sepsis. Similar but less severe than prior sepsis admission. - Monitor   Anemia of CKD: Presumed diagnosis. Note drop in hgb w/volume resuscitation to 8.8. No bleeding. Pt's hgb at previous admission was as low as 7.7, discharged at 9.2, so was hemoconcentrated at this admission.  - Monitor closely.    AKI on CKD stage IIIa: Unclear baseline with values 1.8 in 2021, 2.2 earlier this year.  - DC vanc - Hold lisinopril, monitor BMP (improving)   Antibiotic-associated diarrhea: Benign exam, no bleeding. Has hx of same.  - Probiotics, prn loperamide (a home med of his)   DNR: POA.    History of prostate CA s/p prostatectomy: Noted.   Diverticulosis: No inflammation on CT, asymptomatic at this time. No bleeding.   Obesity: Estimated body mass index is 30.35 kg/m as calculated from the following:   Height as of this encounter: 5\' 7"  (1.702 m).   Weight as of this encounter: 87.9 kg.  DVT prophylaxis: Eliquis Code Status: DNR Family Communication: None at bedside Disposition Plan:  Status is: Inpatient  Remains inpatient appropriate because: Needs to complete culture data, continue IV abx, therapy evaluations.  Consultants:  None  Procedures:  None  Antimicrobials: Vancomycin, cefepime Ceftriaxone   Subjective: Feeling much better, no shaking chills last night. Temp was up to 100 again this morning with low BP and HR, asymptomatic. No bleeding or other issues reported.  Objective: Vitals:   06/12/21 1433 06/12/21 1934 06/13/21 0434 06/13/21 1039  BP: (!) 146/63 139/63 (!) 110/46 (!) 116/44  Pulse: 86 80 61 87  Resp: 18  Temp: 99.9 F (37.7 C) 98.8 F (37.1 C) 100 F (37.8 C)   TempSrc: Oral Oral Oral   SpO2: 98% 96% 98%   Weight:   87.9 kg   Height:        Intake/Output Summary (Last 24 hours) at 06/13/2021 1417 Last data filed at 06/13/2021 0834 Gross per 24 hour  Intake 1100.75 ml  Output 800 ml  Net 300.75 ml    Filed Weights   06/11/21 2245 06/12/21 0232 06/13/21 0434  Weight: 87.5 kg 87.5 kg 87.9 kg    Gen: 87 y.o. male in no distress  Pulm: Non-labored breathing room air. Clear to auscultation bilaterally.  CV: Regular rate and rhythm. No murmur, rub, or gallop. No JVD, no pitting pedal edema. GI: Abdomen soft, non-tender, non-distended, with normoactive bowel sounds. No organomegaly or masses felt. Ext: Warm, no deformities Skin: No rashes, lesions or ulcers on visualized skin Neuro: Alert and oriented. No focal neurological deficits. Psych: Judgement and insight appear normal. Mood & affect appropriate.   Data Reviewed: I have personally reviewed following labs and imaging studies  CBC: Recent Labs  Lab 06/11/21 1543 06/12/21 0216 06/13/21 0756  WBC 13.8* 12.3* 7.7  NEUTROABS 12.1* 10.2* 5.0  HGB 11.4* 8.8* 9.4*  HCT 33.8* 26.5* 28.3*  MCV 92.3 94.0 94.0  PLT 140* 111* 244*   Basic Metabolic Panel: Recent Labs  Lab 06/11/21 1543 06/12/21 0216 06/13/21 0756  NA 130* 133* 135  K 4.3 3.9 3.6  CL 98 101 102  CO2 20* 22 22  GLUCOSE 193* 155* 107*  BUN 37* 34* 28*  CREATININE 2.64* 2.72* 2.32*  CALCIUM 8.4* 8.0* 8.1*   GFR: Estimated Creatinine Clearance: 21.9 mL/min (A) (by C-G formula based on SCr of 2.32 mg/dL (H)). Liver Function Tests: Recent Labs  Lab 06/11/21 1543 06/12/21 0216  AST 31 30  ALT 15 15  ALKPHOS 60 47  BILITOT 1.0 0.6  PROT 7.0 5.1*  ALBUMIN 3.7 2.7*   No results for input(s): LIPASE, AMYLASE in the last 168 hours. No results for input(s): AMMONIA in the last 168 hours. Coagulation Profile: Recent Labs  Lab 06/11/21 1610  INR 1.9*   Cardiac Enzymes: No results for input(s): CKTOTAL, CKMB, CKMBINDEX, TROPONINI in the last 168 hours. BNP (last 3 results) No results for input(s): PROBNP in the last 8760 hours. HbA1C: Recent Labs    06/13/21 0515  HGBA1C 5.7*   CBG: No results for input(s): GLUCAP in the last 168 hours. Lipid  Profile: No results for input(s): CHOL, HDL, LDLCALC, TRIG, CHOLHDL, LDLDIRECT in the last 72 hours. Thyroid Function Tests: No results for input(s): TSH, T4TOTAL, FREET4, T3FREE, THYROIDAB in the last 72 hours. Anemia Panel: No results for input(s): VITAMINB12, FOLATE, FERRITIN, TIBC, IRON, RETICCTPCT in the last 72 hours. Urine analysis:    Component Value Date/Time   COLORURINE YELLOW 06/11/2021 1745   APPEARANCEUR CLOUDY (A) 06/11/2021 1745   LABSPEC 1.025 06/11/2021 1745   PHURINE 5.5 06/11/2021 1745   GLUCOSEU NEGATIVE 06/11/2021 1745   HGBUR MODERATE (A) 06/11/2021 1745   BILIRUBINUR NEGATIVE 06/11/2021 1745   KETONESUR NEGATIVE 06/11/2021 1745   PROTEINUR 100 (A) 06/11/2021 1745   UROBILINOGEN 0.2 01/09/2015 1853   NITRITE POSITIVE (A) 06/11/2021 1745   LEUKOCYTESUR MODERATE (A) 06/11/2021 1745   Recent Results (from the past 240 hour(s))  Resp Panel by RT-PCR (Flu A&B, Covid) Nasopharyngeal Swab     Status: None   Collection Time: 06/11/21  3:30 PM  Specimen: Nasopharyngeal Swab; Nasopharyngeal(NP) swabs in vial transport medium  Result Value Ref Range Status   SARS Coronavirus 2 by RT PCR NEGATIVE NEGATIVE Final    Comment: (NOTE) SARS-CoV-2 target nucleic acids are NOT DETECTED.  The SARS-CoV-2 RNA is generally detectable in upper respiratory specimens during the acute phase of infection. The lowest concentration of SARS-CoV-2 viral copies this assay can detect is 138 copies/mL. A negative result does not preclude SARS-Cov-2 infection and should not be used as the sole basis for treatment or other patient management decisions. A negative result may occur with  improper specimen collection/handling, submission of specimen other than nasopharyngeal swab, presence of viral mutation(s) within the areas targeted by this assay, and inadequate number of viral copies(<138 copies/mL). A negative result must be combined with clinical observations, patient history, and  epidemiological information. The expected result is Negative.  Fact Sheet for Patients:  EntrepreneurPulse.com.au  Fact Sheet for Healthcare Providers:  IncredibleEmployment.be  This test is no t yet approved or cleared by the [place] FDA and  has been authorized for detection and/or diagnosis of SARS-CoV-2 by FDA under an Emergency Use Authorization (EUA). This EUA will remain  in effect (meaning this test can be used) for the duration of the COVID-19 declaration under Section 564(b)(1) of the Act, 21 U.S.C.section 360bbb-3(b)(1), unless the authorization is terminated  or revoked sooner.       Influenza A by PCR NEGATIVE NEGATIVE Final   Influenza B by PCR NEGATIVE NEGATIVE Final    Comment: (NOTE) The Xpert Xpress SARS-CoV-2/FLU/RSV plus assay is intended as an aid in the diagnosis of influenza from Nasopharyngeal swab specimens and should not be used as a sole basis for treatment. Nasal washings and aspirates are unacceptable for Xpert Xpress SARS-CoV-2/FLU/RSV testing.  Fact Sheet for Patients: EntrepreneurPulse.com.au  Fact Sheet for Healthcare Providers: IncredibleEmployment.be  This test is not yet approved or cleared by the [place] FDA and has been authorized for detection and/or diagnosis of SARS-CoV-2 by FDA under an Emergency Use Authorization (EUA). This EUA will remain in effect (meaning this test can be used) for the duration of the COVID-19 declaration under Section 564(b)(1) of the Act, 21 U.S.C. section 360bbb-3(b)(1), unless the authorization is terminated or revoked.  Performed at [facility], [address] [place], [place] [address]   Blood Culture (routine x 2)     Status: Abnormal (Preliminary result)   Collection Time: 06/11/21  3:44 PM   Specimen: Left Antecubital; Blood  Result Value Ref Range Status   Specimen Description   Final    LEFT  ANTECUBITAL BLOOD Performed at [facility], [address] [place], [place] [address]    Special Requests   Final    Blood Culture adequate volume BOTTLES DRAWN AEROBIC AND ANAEROBIC Performed at [facility], [address] [place], [place] [address]    Culture  Setup Time   Final    GRAM NEGATIVE RODS ANAEROBIC BOTTLE ONLY CRITICAL RESULT CALLED TO, READ BACK BY AND VERIFIED WITH: E,BREWINGTON PHARMD @0900  06/12/21 EB    Culture (A)  Final    ESCHERICHIA COLI SUSCEPTIBILITIES TO FOLLOW Performed at [facility], [place] [address] [place], [place] [address]    Report Status PENDING  Incomplete  Blood Culture ID Panel (Reflexed)     Status: Abnormal   Collection Time: 06/11/21  3:44 PM  Result Value Ref Range Status   Enterococcus faecalis NOT DETECTED NOT  DETECTED Final   Enterococcus Faecium NOT DETECTED NOT DETECTED Final   Listeria monocytogenes NOT DETECTED NOT DETECTED Final   Staphylococcus species NOT DETECTED NOT DETECTED Final   Staphylococcus aureus (BCID) NOT DETECTED NOT DETECTED Final   Staphylococcus epidermidis NOT DETECTED NOT DETECTED Final   Staphylococcus lugdunensis NOT DETECTED NOT DETECTED Final   Streptococcus species NOT DETECTED NOT DETECTED Final   Streptococcus agalactiae NOT DETECTED NOT DETECTED Final   Streptococcus pneumoniae NOT DETECTED NOT DETECTED Final   Streptococcus pyogenes NOT DETECTED NOT DETECTED Final   A.calcoaceticus-baumannii NOT DETECTED NOT DETECTED Final   Bacteroides fragilis NOT DETECTED NOT DETECTED Final   Enterobacterales DETECTED (A) NOT DETECTED Final    Comment: Enterobacterales represent a large order of gram negative bacteria, not a single organism. CRITICAL RESULT CALLED TO, READ BACK BY AND VERIFIED WITH: E,BREWINGTON PHARMD @0900  06/12/21 EB    Enterobacter cloacae complex NOT DETECTED NOT DETECTED Final   Escherichia coli DETECTED (A) NOT DETECTED Final    Comment: CRITICAL RESULT  CALLED TO, READ BACK BY AND VERIFIED WITH: E,BREWINGTON PHARMD @0900  06/12/21 EB    Klebsiella aerogenes NOT DETECTED NOT DETECTED Final   Klebsiella oxytoca NOT DETECTED NOT DETECTED Final   Klebsiella pneumoniae NOT DETECTED NOT DETECTED Final   Proteus species NOT DETECTED NOT DETECTED Final   Salmonella species NOT DETECTED NOT DETECTED Final   Serratia marcescens NOT DETECTED NOT DETECTED Final   Haemophilus influenzae NOT DETECTED NOT DETECTED Final   Neisseria meningitidis NOT DETECTED NOT DETECTED Final   Pseudomonas aeruginosa NOT DETECTED NOT DETECTED Final   Stenotrophomonas maltophilia NOT DETECTED NOT DETECTED Final   Candida albicans NOT DETECTED NOT DETECTED Final   Candida auris NOT DETECTED NOT DETECTED Final   Candida glabrata NOT DETECTED NOT DETECTED Final   Candida krusei NOT DETECTED NOT DETECTED Final   Candida parapsilosis NOT DETECTED NOT DETECTED Final   Candida tropicalis NOT DETECTED NOT DETECTED Final   Cryptococcus neoformans/gattii NOT DETECTED NOT DETECTED Final   CTX-M ESBL NOT DETECTED NOT DETECTED Final   Carbapenem resistance IMP NOT DETECTED NOT DETECTED Final   Carbapenem resistance KPC NOT DETECTED NOT DETECTED Final   Carbapenem resistance NDM NOT DETECTED NOT DETECTED Final   Carbapenem resist OXA 48 LIKE NOT DETECTED NOT DETECTED Final   Carbapenem resistance VIM NOT DETECTED NOT DETECTED Final    Comment: Performed at [facility], [address] [place], [place] [address]  Blood Culture (routine x 2)     Status: Abnormal (Preliminary result)   Collection Time: 06/11/21  4:10 PM   Specimen: BLOOD LEFT FOREARM  Result Value Ref Range Status   Specimen Description   Final    BLOOD LEFT FOREARM BLOOD Performed at [facility], [address] [place], [place] [address]    Special Requests   Final    Blood Culture results may not be optimal due to an excessive volume of blood received in culture bottles [place] Performed at [facility], [address] [place], [place] [address]    Culture  Setup Time   Final    GRAM NEGATIVE RODS AEROBIC BOTTLE ONLY CRITICAL VALUE NOTED.  VALUE IS CONSISTENT WITH PREVIOUSLY REPORTED AND CALLED VALUE. Performed at [facility], [place] [address] [place], [place] [address]    Culture ESCHERICHIA COLI (A)  Final   Report Status PENDING  Incomplete  Urine Culture  Status: Abnormal (Preliminary result)   Collection Time: 06/11/21  5:45 PM   Specimen: In/Out Cath Urine  Result Value Ref Range Status   Specimen Description   Final    IN/OUT CATH URINE Performed at [facility], [address] [place], [place] [address]    Special Requests   Final    NONE Performed at [facility], [address] [place], [place] [address]    Culture (A)  Final    >=100,000 COLONIES/mL ESCHERICHIA COLI SUSCEPTIBILITIES TO FOLLOW Performed at [facility], [place] [address], [place], [place] [address]    Report Status PENDING  Incomplete  Culture, blood (x 2)     Status: None (Preliminary result)   Collection Time: 06/12/21  2:16 AM   Specimen: BLOOD LEFT HAND  Result Value Ref Range Status   Specimen Description BLOOD LEFT HAND  Final   Special Requests   Final    BOTTLES DRAWN AEROBIC AND ANAEROBIC Blood Culture adequate volume   Culture   Final    NO GROWTH 1 DAY Performed at [facility], [place] [address] [place], [place] [address]    Report Status PENDING  Incomplete  Culture, blood (x 2)     Status: None (Preliminary result)   Collection Time: 06/12/21  2:16 AM   Specimen: BLOOD RIGHT HAND  Result Value Ref Range Status   Specimen Description BLOOD RIGHT HAND  Final   Special Requests   Final    BOTTLES DRAWN AEROBIC AND ANAEROBIC Blood Culture adequate volume   Culture   Final    NO GROWTH 1 DAY Performed at [facility], [place] [address], [place],  [address]    Report Status  PENDING  Incomplete      Radiology Studies: DG Chest Port 1 View  Result Date: 06/11/2021 CLINICAL DATA:  Fatigue for 10 days.  Question sepsis. EXAM: PORTABLE CHEST 1 VIEW COMPARISON:  11/02/2020 CT.  Most recent plain film 01/04/2019 FINDINGS: Prior median sternotomy. Remote right rib fractures. The Chin overlies the left apex. Patient rotated left. Normal heart size for level of inspiration. No pleural effusion or pneumothorax. Clear lungs. IMPRESSION: No acute cardiopulmonary disease. Electronically Signed   By: Abigail Miyamoto M.D.   On: 06/11/2021 16:09   ECHOCARDIOGRAM COMPLETE  Result Date: 06/13/2021    ECHOCARDIOGRAM REPORT   Patient Name:   Randy Chen [facility] Date of Exam: 06/13/2021 Medical Rec #:  [id number]         Height:       67.0 in Accession #:    [id number]        Weight:       193.8 lb Date of Birth:  [birth year]         BSA:          1.995 m Patient Age:    61 years          BP:           110/46 mmHg Patient Gender: M                 HR:           80 bpm. Exam Location:  Inpatient Procedure: 2D Echo Indications:    Bacteremia. Aortic valve replacement  History:        Patient has prior history of Echocardiogram examinations, most  recent 12/18/2018. Prior CABG, PAD and chronic kidney disease.                 Sepsis.; Risk Factors:Dyslipidemia.                 Aortic Valve: bioprosthetic valve is present in the aortic                 position. Procedure Date: 2006.  Sonographer:    Johny Chess RDCS Referring Phys: [place]  1. Left ventricular ejection fraction, by estimation, is 70 to 75%. The left ventricle has hyperdynamic function. The left ventricle has no regional wall motion abnormalities. There is mild left ventricular hypertrophy of the basal-septal segment. Left ventricular diastolic parameters are consistent with Grade I diastolic dysfunction (impaired relaxation).  2. Right ventricular systolic function is normal. The right ventricular  size is normal. There is mildly elevated pulmonary artery systolic pressure.  3. Left atrial size was mildly dilated.  4. The mitral valve is normal in structure. Mild to moderate mitral valve regurgitation. No evidence of mitral stenosis.  5. The aortic valve has been repaired/replaced. Aortic valve regurgitation is trivial. No aortic stenosis is present. There is a bioprosthetic valve present in the aortic position. Procedure Date: 2006.  6. The inferior vena cava is normal in size with greater than 50% respiratory variability, suggesting right atrial pressure of 3 mmHg. Conclusion(s)/Recommendation(s): No evidence of valvular vegetations on this transthoracic echocardiogram but technically difficult. Consider a transesophageal echocardiogram to exclude infective endocarditis if clinically indicated. FINDINGS  Left Ventricle: Left ventricular ejection fraction, by estimation, is 70 to 75%. The left ventricle has hyperdynamic function. The left ventricle has no regional wall motion abnormalities. The left ventricular internal cavity size was normal in size. There is mild left ventricular hypertrophy of the basal-septal segment. Left ventricular diastolic parameters are consistent with Grade I diastolic dysfunction (impaired relaxation). Right Ventricle: The right ventricular size is normal. Right ventricular systolic function is normal. There is mildly elevated pulmonary artery systolic pressure. The tricuspid regurgitant velocity is 3.08 m/s, and with an assumed right atrial pressure of 3 mmHg, the estimated right ventricular systolic pressure is 55.7 mmHg. Left Atrium: Left atrial size was mildly dilated. Right Atrium: Right atrial size was normal in size. Pericardium: There is no evidence of pericardial effusion. Mitral Valve: The mitral valve is normal in structure. Mild mitral annular calcification. Mild to moderate mitral valve regurgitation. No evidence of mitral valve stenosis. Tricuspid Valve: The  tricuspid valve is normal in structure. Tricuspid valve regurgitation is mild . No evidence of tricuspid stenosis. Aortic Valve: The aortic valve has been repaired/replaced. Aortic valve regurgitation is trivial. No aortic stenosis is present. Aortic valve mean gradient measures 7.0 mmHg. Aortic valve peak gradient measures 13.1 mmHg. Aortic valve area, by VTI measures 1.91 cm. There is a bioprosthetic valve present in the aortic position. Procedure Date: 2006. Pulmonic Valve: The pulmonic valve was not well visualized. Pulmonic valve regurgitation is not visualized. No evidence of pulmonic stenosis. Aorta: The aortic root is normal in size and structure. Venous: The inferior vena cava is normal in size with greater than 50% respiratory variability, suggesting right atrial pressure of 3 mmHg. IAS/Shunts: No atrial level shunt detected by color flow Doppler.  LEFT VENTRICLE PLAX 2D LVIDd:         3.60 cm   Diastology LVIDs:         2.40 cm   LV e' medial:  6.64 cm/s LV PW:         1.00 cm   LV E/e' medial:  11.9 LV IVS:        1.20 cm   LV e' lateral:   12.00 cm/s LVOT diam:     2.00 cm   LV E/e' lateral: 6.6 LV SV:         59 LV SV Index:   30 LVOT Area:     3.14 cm  RIGHT VENTRICLE            IVC RV S prime:     8.05 cm/s  IVC diam: 1.20 cm LEFT ATRIUM           Index        RIGHT ATRIUM           Index LA diam:      4.40 cm 2.21 cm/m   RA Area:     15.50 cm LA Vol (A2C): 74.9 ml 37.54 ml/m  RA Volume:   37.70 ml  18.89 ml/m LA Vol (A4C): 80.0 ml 40.09 ml/m  AORTIC VALVE AV Area (Vmax):    1.67 cm AV Area (Vmean):   1.65 cm AV Area (VTI):     1.91 cm AV Vmax:           181.00 cm/s AV Vmean:          124.000 cm/s AV VTI:            0.308 m AV Peak Grad:      13.1 mmHg AV Mean Grad:      7.0 mmHg LVOT Vmax:         96.45 cm/s LVOT Vmean:        65.100 cm/s LVOT VTI:          0.188 m LVOT/AV VTI ratio: 0.61  AORTA Ao Asc diam: 3.30 cm MITRAL VALVE               TRICUSPID VALVE MV Area (PHT): 4.39 cm    TR  Peak grad:   37.9 mmHg MV Decel Time: 173 msec    TR Vmax:        308.00 cm/s MV E velocity: 79.20 cm/s MV A velocity: 96.10 cm/s  SHUNTS MV E/A ratio:  0.82        Systemic VTI:  0.19 m                            Systemic Diam: 2.00 cm Kirk Ruths MD Electronically signed by Kirk Ruths MD Signature Date/Time: 06/13/2021/12:42:05 PM    Final    CT RENAL STONE STUDY  Result Date: 06/12/2021 CLINICAL DATA:  Flank pain, kidney stone suspected EXAM: CT ABDOMEN AND PELVIS WITHOUT CONTRAST TECHNIQUE: Multidetector CT imaging of the abdomen and pelvis was performed following the standard protocol without IV contrast. COMPARISON:  11/09/2018 FINDINGS: Lower chest: Extensive calcified coronary arteries and mitral valve. Aortic calcifications. No acute abnormality. Hepatobiliary: No focal hepatic abnormality. Gallbladder unremarkable. Pancreas: No focal abnormality or ductal dilatation. Spleen: No focal abnormality.  Normal size. Adrenals/Urinary Tract: Renovascular calcifications bilaterally. No renal or ureteral stones. No hydronephrosis. Adrenal glands and urinary bladder unremarkable. Stomach/Bowel: Left colonic diverticulosis, most pronounced in the sigmoid colon. No active diverticulitis. Stomach and small bowel decompressed. No bowel obstruction. Vascular/Lymphatic: Aortoiliac atherosclerosis. No evidence of aneurysm or adenopathy. Reproductive: Prior prostatectomy.  No visible mass. Other: No free fluid or free air. Musculoskeletal: No  acute bony abnormality. Degenerative changes in the lumbar spine. IMPRESSION: No renal or ureteral stones.  No hydronephrosis. Left colonic diverticulosis.  No active diverticulitis. Coronary artery disease, aortic atherosclerosis. No acute findings. Electronically Signed   By: Rolm Baptise M.D.   On: 06/12/2021 08:04    Scheduled Meds:  apixaban  2.5 mg Oral BID   atorvastatin  40 mg Oral Daily   metoprolol succinate  25 mg Oral Daily   saccharomyces boulardii  250 mg  Oral BID   timolol  1 drop Right Eye BID   Continuous Infusions:  cefTRIAXone (ROCEPHIN)  IV       LOS: 2 days   Time spent: 25 minutes.  Patrecia Pour, MD [facility] [email or web address] 06/13/2021, 2:17 PM

## 2021-06-13 NOTE — Plan of Care (Signed)
Problem: Health Behavior/Discharge Planning: °Goal: Ability to manage health-related needs will improve °Outcome: Progressing °  °Problem: Clinical Measurements: °Goal: Ability to maintain clinical measurements within normal limits will improve °Outcome: Progressing °Goal: Diagnostic test results will improve °Outcome: Progressing °  °Problem: Nutrition: °Goal: Adequate nutrition will be maintained °Outcome: Progressing °  °Problem: Coping: °Goal: Level of anxiety will decrease °Outcome: Completed/Met °  °Problem: Elimination: °Goal: Will not experience complications related to urinary retention °Outcome: Completed/Met °  °Problem: Pain Managment: °Goal: General experience of comfort will improve °Outcome: Completed/Met °  °

## 2021-06-13 NOTE — Progress Notes (Signed)
°  Echocardiogram 2D Echocardiogram has been performed.  Randy Chen 06/13/2021, 9:39 AM

## 2021-06-14 LAB — CBC WITH DIFFERENTIAL/PLATELET
Abs Immature Granulocytes: 0.02 10*3/uL (ref 0.00–0.07)
Basophils Absolute: 0 10*3/uL (ref 0.0–0.1)
Basophils Relative: 0 %
Eosinophils Absolute: 0.2 10*3/uL (ref 0.0–0.5)
Eosinophils Relative: 3 %
HCT: 26.6 % — ABNORMAL LOW (ref 39.0–52.0)
Hemoglobin: 9 g/dL — ABNORMAL LOW (ref 13.0–17.0)
Immature Granulocytes: 0 %
Lymphocytes Relative: 23 %
Lymphs Abs: 1.3 10*3/uL (ref 0.7–4.0)
MCH: 31.4 pg (ref 26.0–34.0)
MCHC: 33.8 g/dL (ref 30.0–36.0)
MCV: 92.7 fL (ref 80.0–100.0)
Monocytes Absolute: 1.1 10*3/uL — ABNORMAL HIGH (ref 0.1–1.0)
Monocytes Relative: 19 %
Neutro Abs: 3.2 10*3/uL (ref 1.7–7.7)
Neutrophils Relative %: 55 %
Platelets: 98 10*3/uL — ABNORMAL LOW (ref 150–400)
RBC: 2.87 MIL/uL — ABNORMAL LOW (ref 4.22–5.81)
RDW: 12.8 % (ref 11.5–15.5)
WBC: 5.9 10*3/uL (ref 4.0–10.5)
nRBC: 0 % (ref 0.0–0.2)

## 2021-06-14 LAB — URINE CULTURE: Culture: >=100000 — AB

## 2021-06-14 LAB — BASIC METABOLIC PANEL
Anion gap: 8 (ref 5–15)
BUN: 32 mg/dL — ABNORMAL HIGH (ref 8–23)
CO2: 23 mmol/L (ref 22–32)
Calcium: 8 mg/dL — ABNORMAL LOW (ref 8.9–10.3)
Chloride: 105 mmol/L (ref 98–111)
Creatinine, Ser: 2.01 mg/dL — ABNORMAL HIGH (ref 0.61–1.24)
GFR, Estimated: 31 mL/min — ABNORMAL LOW (ref >60)
Glucose, Bld: 106 mg/dL — ABNORMAL HIGH (ref 70–99)
Potassium: 3.4 mmol/L — ABNORMAL LOW (ref 3.5–5.1)
Sodium: 136 mmol/L (ref 135–145)

## 2021-06-14 MED ORDER — POTASSIUM CHLORIDE CRYS ER 20 MEQ PO TBCR
20.0000 meq | EXTENDED_RELEASE_TABLET | Freq: Once | ORAL | Status: AC
  Administered 2021-06-14: 12:00:00 20 meq via ORAL
  Filled 2021-06-14: qty 1

## 2021-06-14 MED ORDER — LEVOFLOXACIN 500 MG PO TABS
500.0000 mg | ORAL_TABLET | ORAL | Status: DC
  Administered 2021-06-14: 12:00:00 500 mg via ORAL
  Filled 2021-06-14: qty 1

## 2021-06-14 MED ORDER — LEVOFLOXACIN 500 MG PO TABS
500.0000 mg | ORAL_TABLET | ORAL | 0 refills | Status: AC
Start: 2021-06-16 — End: 2021-06-19

## 2021-06-14 MED ORDER — METOPROLOL SUCCINATE ER 25 MG PO TB24
25.0000 mg | ORAL_TABLET | Freq: Every day | ORAL | 0 refills | Status: DC
Start: 2021-06-14 — End: 2021-06-29

## 2021-06-14 MED ORDER — SACCHAROMYCES BOULARDII 250 MG PO CAPS: 250.0000 mg | ORAL_CAPSULE | Freq: Two times a day (BID) | ORAL | 0 refills | Status: AC

## 2021-06-14 NOTE — Discharge Summary (Signed)
Physician Discharge Summary  Randy Chen:[id number] DOB: 03-03-1928 DOA: 06/11/2021  PCP: Ginger Organ., MD  Admit date: 06/11/2021 Discharge date: 06/14/2021  Admitted From: [facility] Disposition: [place]   Recommendations for Outpatient Follow-up:  Follow up with PCP in 1-2 weeks with repeat BMP, CBC Follow up with cardiology in the next couple weeks.  Note decrease metoprolol to 25mg  due to bradycardia w/rates in high 30's-50's overnight without prolonged sinus pauses or symptoms.  Note norvasc is being held until follow up with low-normal blood pressures while holding this medication as an inpatient.  Home Health: PT Equipment/Devices: None new Discharge Condition: Stable CODE STATUS: DNR Diet recommendation: Heart healthy  Brief/Interim Summary: Randy Chen is a 87 y.o. male with a history of bioprosthetic AVR, enterococcal bacteremia and discitis in 2020, CAD s/p CABG, PAF on eliquis, and PVD who presented to the ED 12/24 with fever, chills, urinary frequency worsening for a couple days in the setting of fatigue/malaise and intermittent chills/night sweats for weeks. He has no back pain. On arrival, 100.9F and hypotensive with leukocytosis (WBC 13.8k), lactic acid 2.2, and pyuria/bacteriuria on urinalysis. Broad antibiotics and IV fluids were given and the patient admitted for sepsis due to UTI, subsequently blood cultures and urine culture growing E. coli. Antibiotics narrowed to ceftriaxone though subsequently changed to oral levaquin due to resistance to ceftriaxone.   Discharge Diagnoses:  Principal Problem:   Sepsis ([place]) Active Problems:   Hx of CABG   History of prostate cancer   S/P AVR   Acute lower UTI   CKD (chronic kidney disease), stage III (HCC)   Paroxysmal A-fib (HCC)  Sepsis with lactic acidosis due to E. coli bacteremia due to UTI: No complication noted on CT renal stone study. - Initially covered with cefepime, then converted to  CTX x1 dose, though oddly, E. coli not susceptible to this. D/w ID pharmacy, change to oral levaquin renall dosed 500mg  q48h to complete 7 effective days of dosing.  - No vegetations on TTE.   PAF, HTN: Hypotensive and bradycardic after restart of metoprolol 100mg  yesterday. - Will keep metoprolol at newly decreased dose 25mg .   CAD s/p CABG, PAF: No anginal complaints.  - Continue statin, beta blocker as above.  - Continue eliquis    Thrombocytopenia: Acute due to sepsis. Similar but less severe than prior sepsis admission. - Monitor at f/u   Anemia of CKD: Presumed diagnosis. Note drop in hgb w/volume resuscitation to 8.8. No bleeding. Pt's hgb at previous admission was as low as 7.7, discharged at 9.2, so was hemoconcentrated at this admission. DC Hgb 9g/dl. - Monitor CBC at follow up as above   AKI on CKD stage IIIb: Unclear baseline but suspect CrCl baseline is 30-21ml/min.   - DC vanc. Renally dosing levaquin - Hold lisinopril, monitor BMP (improving)   Antibiotic-associated diarrhea: Benign exam, no bleeding. Has hx of same.  - Probiotics, prn loperamide (a home med of his)   DNR: POA.    History of prostate CA s/p prostatectomy: Noted.    Diverticulosis: No inflammation on CT, asymptomatic at this time. No bleeding.   Obesity: Estimated body mass index is 30.35 kg/m as calculated from the following:   Height as of this encounter: 5\' 7"  (1.702 m).   Weight as of this encounter: 87.9 kg.  Discharge Instructions Discharge Instructions     Diet - low sodium heart healthy   Complete by: As directed    Discharge instructions   Complete  by: As directed    You wre evaluated for sepsis due to E. coli blood stream infection due to E. coli UTI. This has improved dramatically with antibiotics which will continue with levaquin 500mg . You should take this every other day. You're getting a dose today before you leave and will need to take 2 more total doses (once 12/29, once 12/31)  to complete the course.   Because your blood pressure is low-normal, for now you should hold norvasc. Do not throw it away, but don't take it until you follow up with your cardiologist and/or PCP. For now you should also decrease the dose of metoprolol that you take until that follow up as well. Your heart rate dipped low, so take only 25mg  once daily instead of the 100mg  once daily.   If your symptoms return, seek medical attention right away.   Increase activity slowly   Complete by: As directed       Allergies as of 06/14/2021   No Known Allergies      Medication List     STOP taking these medications    amLODipine 5 MG tablet Commonly known as: NORVASC       TAKE these medications    acetaminophen 500 MG tablet Commonly known as: TYLENOL Take 500-1,000 mg by mouth every 8 (eight) hours as needed for mild pain or headache.   apixaban 2.5 MG Tabs tablet Commonly known as: ELIQUIS Take 1 tablet (2.5 mg total) by mouth 2 (two) times daily.   atorvastatin 40 MG tablet Commonly known as: LIPITOR Take 1 tablet (40 mg total) by mouth daily. What changed:  how much to take when to take this   Benefiber Powd Take 1 packet by mouth 2 (two) times daily.   CALCIUM 600 PO Take 600 mg by mouth daily.   folic acid 601 MCG tablet Commonly known as: FOLVITE Take 400 mcg by mouth daily.   GLUCOSAMINE PO Take 1 tablet by mouth daily.   levofloxacin 500 MG tablet Commonly known as: LEVAQUIN Take 1 tablet (500 mg total) by mouth every other day for 2 doses. Take on 12/29 and 12/31. Start taking on: June 16, 2021   lisinopril 20 MG tablet Commonly known as: ZESTRIL Take 1 tablet (20 mg total) by mouth daily.   metoprolol succinate 25 MG 24 hr tablet Commonly known as: TOPROL-XL Take 1 tablet (25 mg total) by mouth daily. Take with or immediately following a meal. What changed:  medication strength See the new instructions.   pantoprazole 20 MG tablet Commonly  known as: PROTONIX Take 20 mg by mouth every morning.   saccharomyces boulardii 250 MG capsule Commonly known as: FLORASTOR Take 1 capsule (250 mg total) by mouth 2 (two) times daily.   THERAGRAN PO Take 1 tablet by mouth daily.   timolol 0.5 % ophthalmic solution Commonly known as: TIMOPTIC Place 1 drop into the right eye 2 (two) times daily.   vitamin C 500 MG tablet Commonly known as: ASCORBIC ACID Take 500 mg by mouth daily.   VITAMIN D-3 PO Take 1 capsule by mouth daily.        Follow-up Information     Ginger Organ., MD Follow up.   Specialty: Internal Medicine Contact information: [place] [address] [phone]         Lelon Perla, MD .   Specialty: Cardiology Contact information: [address] [place] [address] [phone]  No Known Allergies  Consultations: ID pharmacy  Procedures/Studies: DG Chest Port 1 View  Result Date: 06/11/2021 CLINICAL DATA:  Fatigue for 10 days.  Question sepsis. EXAM: PORTABLE CHEST 1 VIEW COMPARISON:  11/02/2020 CT.  Most recent plain film 01/04/2019 FINDINGS: Prior median sternotomy. Remote right rib fractures. The Chin overlies the left apex. Patient rotated left. Normal heart size for level of inspiration. No pleural effusion or pneumothorax. Clear lungs. IMPRESSION: No acute cardiopulmonary disease. Electronically Signed   By: Abigail Miyamoto M.D.   On: 06/11/2021 16:09   ECHOCARDIOGRAM COMPLETE  Result Date: 06/13/2021    ECHOCARDIOGRAM REPORT   Patient Name:   KAIVEN VESTER [facility] Date of Exam: 06/13/2021 Medical Rec #:  [id number]         Height:       67.0 in Accession #:    [id number]        Weight:       193.8 lb Date of Birth:  1927/07/06         BSA:          1.995 m Patient Age:    53 years          BP:           110/46 mmHg Patient Gender: M                 HR:           80 bpm. Exam Location:  Inpatient Procedure: 2D Echo Indications:     Bacteremia. Aortic valve replacement  History:        Patient has prior history of Echocardiogram examinations, most                 recent 12/18/2018. Prior CABG, PAD and chronic kidney disease.                 Sepsis.; Risk Factors:Dyslipidemia.                 Aortic Valve: bioprosthetic valve is present in the aortic                 position. Procedure Date: 2006.  Sonographer:    Johny Chess RDCS Referring Phys: Bruno  1. Left ventricular ejection fraction, by estimation, is 70 to 75%. The left ventricle has hyperdynamic function. The left ventricle has no regional wall motion abnormalities. There is mild left ventricular hypertrophy of the basal-septal segment. Left ventricular diastolic parameters are consistent with Grade I diastolic dysfunction (impaired relaxation).  2. Right ventricular systolic function is normal. The right ventricular size is normal. There is mildly elevated pulmonary artery systolic pressure.  3. Left atrial size was mildly dilated.  4. The mitral valve is normal in structure. Mild to moderate mitral valve regurgitation. No evidence of mitral stenosis.  5. The aortic valve has been repaired/replaced. Aortic valve regurgitation is trivial. No aortic stenosis is present. There is a bioprosthetic valve present in the aortic position. Procedure Date: 2006.  6. The inferior vena cava is normal in size with greater than 50% respiratory variability, suggesting right atrial pressure of 3 mmHg. Conclusion(s)/Recommendation(s): No evidence of valvular vegetations on this transthoracic echocardiogram but technically difficult. Consider a transesophageal echocardiogram to exclude infective endocarditis if clinically indicated. FINDINGS  Left Ventricle: Left ventricular ejection fraction, by estimation, is 70 to 75%. The left ventricle has hyperdynamic function. The left ventricle has no regional wall motion abnormalities. The left ventricular internal cavity size was  normal in size. There is mild left ventricular hypertrophy of the basal-septal segment. Left ventricular diastolic parameters are consistent with Grade I diastolic dysfunction (impaired relaxation). Right Ventricle: The right ventricular size is normal. Right ventricular systolic function is normal. There is mildly elevated pulmonary artery systolic pressure. The tricuspid regurgitant velocity is 3.08 m/s, and with an assumed right atrial pressure of 3 mmHg, the estimated right ventricular systolic pressure is 75.9 mmHg. Left Atrium: Left atrial size was mildly dilated. Right Atrium: Right atrial size was normal in size. Pericardium: There is no evidence of pericardial effusion. Mitral Valve: The mitral valve is normal in structure. Mild mitral annular calcification. Mild to moderate mitral valve regurgitation. No evidence of mitral valve stenosis. Tricuspid Valve: The tricuspid valve is normal in structure. Tricuspid valve regurgitation is mild . No evidence of tricuspid stenosis. Aortic Valve: The aortic valve has been repaired/replaced. Aortic valve regurgitation is trivial. No aortic stenosis is present. Aortic valve mean gradient measures 7.0 mmHg. Aortic valve peak gradient measures 13.1 mmHg. Aortic valve area, by VTI measures 1.91 cm. There is a bioprosthetic valve present in the aortic position. Procedure Date: 2006. Pulmonic Valve: The pulmonic valve was not well visualized. Pulmonic valve regurgitation is not visualized. No evidence of pulmonic stenosis. Aorta: The aortic root is normal in size and structure. Venous: The inferior vena cava is normal in size with greater than 50% respiratory variability, suggesting right atrial pressure of 3 mmHg. IAS/Shunts: No atrial level shunt detected by color flow Doppler.  LEFT VENTRICLE PLAX 2D LVIDd:         3.60 cm   Diastology LVIDs:         2.40 cm   LV e' medial:    6.64 cm/s LV PW:         1.00 cm   LV E/e' medial:  11.9 LV IVS:        1.20 cm   LV e'  lateral:   12.00 cm/s LVOT diam:     2.00 cm   LV E/e' lateral: 6.6 LV SV:         59 LV SV Index:   30 LVOT Area:     3.14 cm  RIGHT VENTRICLE            IVC RV S prime:     8.05 cm/s  IVC diam: 1.20 cm LEFT ATRIUM           Index        RIGHT ATRIUM           Index LA diam:      4.40 cm 2.21 cm/m   RA Area:     15.50 cm LA Vol (A2C): 74.9 ml 37.54 ml/m  RA Volume:   37.70 ml  18.89 ml/m LA Vol (A4C): 80.0 ml 40.09 ml/m  AORTIC VALVE AV Area (Vmax):    1.67 cm AV Area (Vmean):   1.65 cm AV Area (VTI):     1.91 cm AV Vmax:           181.00 cm/s AV Vmean:          124.000 cm/s AV VTI:            0.308 m AV Peak Grad:      13.1 mmHg AV Mean Grad:      7.0 mmHg LVOT Vmax:         96.45 cm/s LVOT Vmean:        65.100 cm/s LVOT VTI:  0.188 m LVOT/AV VTI ratio: 0.61  AORTA Ao Asc diam: 3.30 cm MITRAL VALVE               TRICUSPID VALVE MV Area (PHT): 4.39 cm    TR Peak grad:   37.9 mmHg MV Decel Time: 173 msec    TR Vmax:        308.00 cm/s MV E velocity: 79.20 cm/s MV A velocity: 96.10 cm/s  SHUNTS MV E/A ratio:  0.82        Systemic VTI:  0.19 m                            Systemic Diam: 2.00 cm Kirk Ruths MD Electronically signed by Kirk Ruths MD Signature Date/Time: 06/13/2021/12:42:05 PM    Final    CT RENAL STONE STUDY  Result Date: 06/12/2021 CLINICAL DATA:  Flank pain, kidney stone suspected EXAM: CT ABDOMEN AND PELVIS WITHOUT CONTRAST TECHNIQUE: Multidetector CT imaging of the abdomen and pelvis was performed following the standard protocol without IV contrast. COMPARISON:  11/09/2018 FINDINGS: Lower chest: Extensive calcified coronary arteries and mitral valve. Aortic calcifications. No acute abnormality. Hepatobiliary: No focal hepatic abnormality. Gallbladder unremarkable. Pancreas: No focal abnormality or ductal dilatation. Spleen: No focal abnormality.  Normal size. Adrenals/Urinary Tract: Renovascular calcifications bilaterally. No renal or ureteral stones. No hydronephrosis.  Adrenal glands and urinary bladder unremarkable. Stomach/Bowel: Left colonic diverticulosis, most pronounced in the sigmoid colon. No active diverticulitis. Stomach and small bowel decompressed. No bowel obstruction. Vascular/Lymphatic: Aortoiliac atherosclerosis. No evidence of aneurysm or adenopathy. Reproductive: Prior prostatectomy.  No visible mass. Other: No free fluid or free air. Musculoskeletal: No acute bony abnormality. Degenerative changes in the lumbar spine. IMPRESSION: No renal or ureteral stones.  No hydronephrosis. Left colonic diverticulosis.  No active diverticulitis. Coronary artery disease, aortic atherosclerosis. No acute findings. Electronically Signed   By: Rolm Baptise M.D.   On: 06/12/2021 08:04     Subjective: Feels well, amenable to returning to [place]. No palpitations, lightheadedness, chest pain or dyspnea reported. No fever/chills.   Discharge Exam: Vitals:   06/13/21 2000 06/14/21 0845  BP: 123/66 129/70  Pulse: 80 70  Resp: 20 18  Temp: 100 F (37.8 C) 98.9 F (37.2 C)  SpO2: 99% 100%   General: Pt is alert, awake, not in acute distress Cardiovascular: RRR, S1/S2 +, no rubs, no gallops Respiratory: CTA bilaterally, no wheezing, no rhonchi Abdominal: Soft, NT, ND, bowel sounds + Extremities: No edema, no cyanosis  Labs: BNP (last 3 results) No results for input(s): BNP in the last 8760 hours. Basic Metabolic Panel: Recent Labs  Lab 06/11/21 1543 06/12/21 0216 06/13/21 0756 06/14/21 0353  NA 130* 133* 135 136  K 4.3 3.9 3.6 3.4*  CL 98 101 102 105  CO2 20* 22 22 23   GLUCOSE 193* 155* 107* 106*  BUN 37* 34* 28* 32*  CREATININE 2.64* 2.72* 2.32* 2.01*  CALCIUM 8.4* 8.0* 8.1* 8.0*   Liver Function Tests: Recent Labs  Lab 06/11/21 1543 06/12/21 0216  AST 31 30  ALT 15 15  ALKPHOS 60 47  BILITOT 1.0 0.6  PROT 7.0 5.1*  ALBUMIN 3.7 2.7*   No results for input(s): LIPASE, AMYLASE in the last 168 hours. No results for input(s): AMMONIA  in the last 168 hours. CBC: Recent Labs  Lab 06/11/21 1543 06/12/21 0216 06/13/21 0756 06/14/21 0353  WBC 13.8* 12.3* 7.7 5.9  NEUTROABS 12.1* 10.2* 5.0 3.2  HGB 11.4* 8.8* 9.4* 9.0*  HCT 33.8* 26.5* 28.3* 26.6*  MCV 92.3 94.0 94.0 92.7  PLT 140* 111* 100* 98*   Cardiac Enzymes: No results for input(s): CKTOTAL, CKMB, CKMBINDEX, TROPONINI in the last 168 hours. BNP: Invalid input(s): POCBNP CBG: No results for input(s): GLUCAP in the last 168 hours. D-Dimer No results for input(s): DDIMER in the last 72 hours. Hgb A1c Recent Labs    06/13/21 0515  HGBA1C 5.7*   Lipid Profile No results for input(s): CHOL, HDL, LDLCALC, TRIG, CHOLHDL, LDLDIRECT in the last 72 hours. Thyroid function studies No results for input(s): TSH, T4TOTAL, T3FREE, THYROIDAB in the last 72 hours.  Invalid input(s): FREET3 Anemia work up No results for input(s): VITAMINB12, FOLATE, FERRITIN, TIBC, IRON, RETICCTPCT in the last 72 hours. Urinalysis    Component Value Date/Time   COLORURINE YELLOW 06/11/2021 1745   APPEARANCEUR CLOUDY (A) 06/11/2021 1745   LABSPEC 1.025 06/11/2021 1745   PHURINE 5.5 06/11/2021 1745   GLUCOSEU NEGATIVE 06/11/2021 1745   HGBUR MODERATE (A) 06/11/2021 1745   BILIRUBINUR NEGATIVE 06/11/2021 1745   KETONESUR NEGATIVE 06/11/2021 1745   PROTEINUR 100 (A) 06/11/2021 1745   UROBILINOGEN 0.2 01/09/2015 1853   NITRITE POSITIVE (A) 06/11/2021 1745   LEUKOCYTESUR MODERATE (A) 06/11/2021 1745    Microbiology Recent Results (from the past 240 hour(s))  Resp Panel by RT-PCR (Flu A&B, Covid) Nasopharyngeal Swab     Status: None   Collection Time: 06/11/21  3:30 PM   Specimen: Nasopharyngeal Swab; Nasopharyngeal(NP) swabs in vial transport medium  Result Value Ref Range Status   SARS Coronavirus 2 by RT PCR NEGATIVE NEGATIVE Final    Comment: (NOTE) SARS-CoV-2 target nucleic acids are NOT DETECTED.  The SARS-CoV-2 RNA is generally detectable in upper  respiratory specimens during the acute phase of infection. The lowest concentration of SARS-CoV-2 viral copies this assay can detect is 138 copies/mL. A negative result does not preclude SARS-Cov-2 infection and should not be used as the sole basis for treatment or other patient management decisions. A negative result may occur with  improper specimen collection/handling, submission of specimen other than nasopharyngeal swab, presence of viral mutation(s) within the areas targeted by this assay, and inadequate number of viral copies(<138 copies/mL). A negative result must be combined with clinical observations, patient history, and epidemiological information. The expected result is Negative.  Fact Sheet for Patients:  EntrepreneurPulse.com.au  Fact Sheet for Healthcare Providers:  IncredibleEmployment.be  This test is no t yet approved or cleared by the [place] FDA and  has been authorized for detection and/or diagnosis of SARS-CoV-2 by FDA under an Emergency Use Authorization (EUA). This EUA will remain  in effect (meaning this test can be used) for the duration of the COVID-19 declaration under Section 564(b)(1) of the Act, 21 U.S.C.section 360bbb-3(b)(1), unless the authorization is terminated  or revoked sooner.       Influenza A by PCR NEGATIVE NEGATIVE Final   Influenza B by PCR NEGATIVE NEGATIVE Final    Comment: (NOTE) The Xpert Xpress SARS-CoV-2/FLU/RSV plus assay is intended as an aid in the diagnosis of influenza from Nasopharyngeal swab specimens and should not be used as a sole basis for treatment. Nasal washings and aspirates are unacceptable for Xpert Xpress SARS-CoV-2/FLU/RSV testing.  Fact Sheet for Patients: EntrepreneurPulse.com.au  Fact Sheet for Healthcare Providers: IncredibleEmployment.be  This test is not yet approved or cleared by the [place] FDA and has been  authorized for detection and/or diagnosis of SARS-CoV-2 by FDA  under an Emergency Use Authorization (EUA). This EUA will remain in effect (meaning this test can be used) for the duration of the COVID-19 declaration under Section 564(b)(1) of the Act, 21 U.S.C. section 360bbb-3(b)(1), unless the authorization is terminated or revoked.  Performed at [facility], [address] [place], [place] [address]   Blood Culture (routine x 2)     Status: Abnormal (Preliminary result)   Collection Time: 06/11/21  3:44 PM   Specimen: Left Antecubital; Blood  Result Value Ref Range Status   Specimen Description   Final    LEFT ANTECUBITAL BLOOD Performed at [facility], [address] [place], [place] [address]    Special Requests   Final    Blood Culture adequate volume BOTTLES DRAWN AEROBIC AND ANAEROBIC Performed at [facility], [address] [place], [place] [address]    Culture  Setup Time   Final    GRAM NEGATIVE RODS ANAEROBIC BOTTLE ONLY CRITICAL RESULT CALLED TO, READ BACK BY AND VERIFIED WITH: E,BREWINGTON PHARMD @0900  06/12/21 EB    Culture (A)  Final    ESCHERICHIA COLI REPEATING SUSCEPTIBILITY Performed at [facility], [place] [address], [place], [place] [address]    Report Status PENDING  Incomplete  Blood Culture ID Panel (Reflexed)     Status: Abnormal   Collection Time: 06/11/21  3:44 PM  Result Value Ref Range Status   Enterococcus faecalis NOT DETECTED NOT DETECTED Final   Enterococcus Faecium NOT DETECTED NOT DETECTED Final   Listeria monocytogenes NOT DETECTED NOT DETECTED Final   Staphylococcus species NOT DETECTED NOT DETECTED Final   Staphylococcus aureus (BCID) NOT DETECTED NOT DETECTED Final   Staphylococcus epidermidis NOT DETECTED NOT DETECTED Final   Staphylococcus lugdunensis NOT DETECTED NOT DETECTED Final   Streptococcus species NOT DETECTED NOT DETECTED Final   Streptococcus agalactiae NOT DETECTED NOT DETECTED  Final   Streptococcus pneumoniae NOT DETECTED NOT DETECTED Final   Streptococcus pyogenes NOT DETECTED NOT DETECTED Final   A.calcoaceticus-baumannii NOT DETECTED NOT DETECTED Final   Bacteroides fragilis NOT DETECTED NOT DETECTED Final   Enterobacterales DETECTED (A) NOT DETECTED Final    Comment: Enterobacterales represent a large order of gram negative bacteria, not a single organism. CRITICAL RESULT CALLED TO, READ BACK BY AND VERIFIED WITH: E,BREWINGTON PHARMD @0900  06/12/21 EB    Enterobacter cloacae complex NOT DETECTED NOT DETECTED Final   Escherichia coli DETECTED (A) NOT DETECTED Final    Comment: CRITICAL RESULT CALLED TO, READ BACK BY AND VERIFIED WITH: E,BREWINGTON PHARMD @0900  06/12/21 EB    Klebsiella aerogenes NOT DETECTED NOT DETECTED Final   Klebsiella oxytoca NOT DETECTED NOT DETECTED Final   Klebsiella pneumoniae NOT DETECTED NOT DETECTED Final   Proteus species NOT DETECTED NOT DETECTED Final   Salmonella species NOT DETECTED NOT DETECTED Final   Serratia marcescens NOT DETECTED NOT DETECTED Final   Haemophilus influenzae NOT DETECTED NOT DETECTED Final   Neisseria meningitidis NOT DETECTED NOT DETECTED Final   Pseudomonas aeruginosa NOT DETECTED NOT DETECTED Final   Stenotrophomonas maltophilia NOT DETECTED NOT DETECTED Final   Candida albicans NOT DETECTED NOT DETECTED Final   Candida auris NOT DETECTED NOT DETECTED Final   Candida glabrata NOT DETECTED NOT DETECTED Final   Candida krusei NOT DETECTED NOT DETECTED Final   Candida parapsilosis NOT DETECTED NOT DETECTED Final   Candida tropicalis NOT DETECTED NOT DETECTED Final   Cryptococcus neoformans/gattii NOT DETECTED NOT DETECTED Final  CTX-M ESBL NOT DETECTED NOT DETECTED Final   Carbapenem resistance IMP NOT DETECTED NOT DETECTED Final   Carbapenem resistance KPC NOT DETECTED NOT DETECTED Final   Carbapenem resistance NDM NOT DETECTED NOT DETECTED Final   Carbapenem resist OXA 48 LIKE NOT DETECTED NOT  DETECTED Final   Carbapenem resistance VIM NOT DETECTED NOT DETECTED Final    Comment: Performed at [facility], [place] [address] [place], [place] [address]  Blood Culture (routine x 2)     Status: Abnormal (Preliminary result)   Collection Time: 06/11/21  4:10 PM   Specimen: BLOOD LEFT FOREARM  Result Value Ref Range Status   Specimen Description   Final    BLOOD LEFT FOREARM BLOOD Performed at [facility], [address] [place], [place] [address]    Special Requests   Final    Blood Culture results may not be optimal due to an excessive volume of blood received in culture bottles Max Performed at [facility], [address] [place], [place] [address]    Culture  Setup Time   Final    GRAM NEGATIVE RODS AEROBIC BOTTLE ONLY CRITICAL VALUE NOTED.  VALUE IS CONSISTENT WITH PREVIOUSLY REPORTED AND CALLED VALUE. Performed at [facility], [place] [address], [place], [place] [address]    Culture ESCHERICHIA COLI (A)  Final   Report Status PENDING  Incomplete  Urine Culture     Status: Abnormal   Collection Time: 06/11/21  5:45 PM   Specimen: In/Out Cath Urine  Result Value Ref Range Status   Specimen Description   Final    IN/OUT CATH URINE Performed at [facility], [address] [place], [place] [address]    Special Requests   Final    NONE Performed at [facility], [address] [place], [place] [address]    Culture >=100,000 COLONIES/mL ESCHERICHIA COLI (A)  Final   Report Status 06/14/2021 FINAL  Final   Organism ID, Bacteria ESCHERICHIA COLI (A)  Final      Susceptibility   Escherichia coli - MIC*    AMPICILLIN >=32 RESISTANT Resistant     CEFAZOLIN >=64 RESISTANT Resistant     CEFEPIME <=0.12 SENSITIVE Sensitive     CEFTRIAXONE 32 RESISTANT Resistant     CIPROFLOXACIN <=0.25 SENSITIVE Sensitive     GENTAMICIN <=1 SENSITIVE Sensitive     IMIPENEM <=0.25 SENSITIVE Sensitive      NITROFURANTOIN <=16 SENSITIVE Sensitive     TRIMETH/SULFA <=20 SENSITIVE Sensitive     AMPICILLIN/SULBACTAM >=32 RESISTANT Resistant     PIP/TAZO 8 SENSITIVE Sensitive     * >=100,000 COLONIES/mL ESCHERICHIA COLI  Culture, blood (x 2)     Status: None (Preliminary result)   Collection Time: 06/12/21  2:16 AM   Specimen: BLOOD LEFT HAND  Result Value Ref Range Status   Specimen Description BLOOD LEFT HAND  Final   Special Requests   Final    BOTTLES DRAWN AEROBIC AND ANAEROBIC Blood Culture adequate volume   Culture   Final    NO GROWTH 2 DAYS Performed at [facility], [address], [place], [place] [address]    Report Status PENDING  Incomplete  Culture, blood (x 2)     Status: None (Preliminary result)   Collection Time: 06/12/21  2:16 AM   Specimen: BLOOD RIGHT HAND  Result Value Ref Range Status   Specimen Description  BLOOD RIGHT HAND  Final   Special Requests   Final    BOTTLES DRAWN AEROBIC AND ANAEROBIC Blood Culture adequate volume   Culture   Final    NO GROWTH 2 DAYS Performed at [facility], [address], [place], [place] [address]    Report Status PENDING  Incomplete    Time coordinating discharge: Approximately 40 minutes  Patrecia Pour, MD  [facility] 06/14/2021, 10:37 AM

## 2021-06-14 NOTE — TOC Transition Note (Signed)
Transition of Care [facility]) - CM/SW Discharge Note   Patient Details  Name: Randy Chen MRN: [id number] Date of Birth: 31-May-1928  Transition of Care [facility] CM/SW Contact:  Carles Collet, RN Phone Number: 06/14/2021, 11:17 AM   Clinical Narrative:    Damaris Schooner w patient at bedside. He states he plans to return home to [place] with his wife. He confirms he has transportation home. He has a walker and a cane available to him at home. He wold like to use the services at [facility] for therapy. Called main line, was transferred to [facility], LVM. Called back and spoke to [place] in the [facility] who states I can fax referral to her at [phone] and she will ensure they are aware of need for PT OT. Referral faxed. No other TOC needs identified at this time    Final next level of care: Home/Self Care Barriers to Discharge: No Barriers Identified   Patient Goals and CMS Choice Patient states their goals for this hospitalization and ongoing recovery are:: return to [facility]      Discharge Placement                       Discharge Plan and Services                                     Social Determinants of Health (SDOH) Interventions     Readmission Risk Interventions Readmission Risk Prevention Plan 09/28/2018  Transportation Screening Complete  PCP or Specialist Appt within 5-7 Days Complete  Home Care Screening Complete  Medication Review (RN CM) Complete  Some recent data might be hidden

## 2021-06-15 ENCOUNTER — Telehealth: Payer: Self-pay | Admitting: *Deleted

## 2021-06-15 LAB — CULTURE, BLOOD (ROUTINE X 2): Special Requests: ADEQUATE

## 2021-06-15 NOTE — Telephone Encounter (Signed)
Pt is requesting a phone call from Nurse Debra... didn't provide specifics.. please advise

## 2021-06-16 ENCOUNTER — Encounter: Payer: Self-pay | Admitting: Cardiology

## 2021-06-16 NOTE — Telephone Encounter (Signed)
Returned call to  patient, patient states he was calling in regard to his recent hospitalization. Patient states that his cardiac medications were adjusted (metoprolol succinate decreased to 25mg  Daily) and stopped his norvasc. Patient would like to know if Dr. Stanford Breed can review the hospital records and give recommendations regarding his medications and also advise on if patient should be seen sooner than March as scheduled. Offered patient soon appointment with Dr. Stanford Breed in [place] but patient states he would rather Dr. Stanford Breed advise on if he needs to be seen sooner. Also offered patient sooner APP appointment but patient declined. Patient states overall he is doing well and is scheduled to see his PCP on 1/19. Will forward to MD for review and advice.

## 2021-06-17 LAB — CULTURE, BLOOD (ROUTINE X 2)
Culture: NO GROWTH
Culture: NO GROWTH
Special Requests: ADEQUATE
Special Requests: ADEQUATE

## 2021-06-17 NOTE — Telephone Encounter (Signed)
Returned call to patient, advised patient of Dr. Jacalyn Lefevre recommendations.   Lelon Perla, MD    Continue meds as outlined at DC and follow HR and BP; fu in March as scheduled  Connell patient to call back to office with any issues, questions, or concerns. Patient verbalized understanding.

## 2021-06-20 DIAGNOSIS — M6281 Muscle weakness (generalized): Secondary | ICD-10-CM | POA: Diagnosis not present

## 2021-06-20 DIAGNOSIS — N390 Urinary tract infection, site not specified: Secondary | ICD-10-CM | POA: Diagnosis not present

## 2021-06-20 DIAGNOSIS — R2681 Unsteadiness on feet: Secondary | ICD-10-CM | POA: Diagnosis not present

## 2021-06-20 DIAGNOSIS — R2689 Other abnormalities of gait and mobility: Secondary | ICD-10-CM | POA: Diagnosis not present

## 2021-06-20 DIAGNOSIS — A4151 Sepsis due to Escherichia coli [E. coli]: Secondary | ICD-10-CM | POA: Diagnosis not present

## 2021-06-20 DIAGNOSIS — I480 Paroxysmal atrial fibrillation: Secondary | ICD-10-CM | POA: Diagnosis not present

## 2021-06-22 DIAGNOSIS — A4151 Sepsis due to Escherichia coli [E. coli]: Secondary | ICD-10-CM | POA: Diagnosis not present

## 2021-06-22 DIAGNOSIS — R2681 Unsteadiness on feet: Secondary | ICD-10-CM | POA: Diagnosis not present

## 2021-06-22 DIAGNOSIS — I480 Paroxysmal atrial fibrillation: Secondary | ICD-10-CM | POA: Diagnosis not present

## 2021-06-22 DIAGNOSIS — R2689 Other abnormalities of gait and mobility: Secondary | ICD-10-CM | POA: Diagnosis not present

## 2021-06-22 DIAGNOSIS — M6281 Muscle weakness (generalized): Secondary | ICD-10-CM | POA: Diagnosis not present

## 2021-06-22 DIAGNOSIS — N390 Urinary tract infection, site not specified: Secondary | ICD-10-CM | POA: Diagnosis not present

## 2021-06-23 DIAGNOSIS — A4151 Sepsis due to Escherichia coli [E. coli]: Secondary | ICD-10-CM | POA: Diagnosis not present

## 2021-06-23 DIAGNOSIS — I480 Paroxysmal atrial fibrillation: Secondary | ICD-10-CM | POA: Diagnosis not present

## 2021-06-23 DIAGNOSIS — I130 Hypertensive heart and chronic kidney disease with heart failure and stage 1 through stage 4 chronic kidney disease, or unspecified chronic kidney disease: Secondary | ICD-10-CM | POA: Diagnosis not present

## 2021-06-23 DIAGNOSIS — E7800 Pure hypercholesterolemia, unspecified: Secondary | ICD-10-CM | POA: Diagnosis not present

## 2021-06-23 DIAGNOSIS — D6869 Other thrombophilia: Secondary | ICD-10-CM | POA: Diagnosis not present

## 2021-06-27 DIAGNOSIS — I480 Paroxysmal atrial fibrillation: Secondary | ICD-10-CM | POA: Diagnosis not present

## 2021-06-27 DIAGNOSIS — R2681 Unsteadiness on feet: Secondary | ICD-10-CM | POA: Diagnosis not present

## 2021-06-27 DIAGNOSIS — A4151 Sepsis due to Escherichia coli [E. coli]: Secondary | ICD-10-CM | POA: Diagnosis not present

## 2021-06-27 DIAGNOSIS — R2689 Other abnormalities of gait and mobility: Secondary | ICD-10-CM | POA: Diagnosis not present

## 2021-06-27 DIAGNOSIS — N390 Urinary tract infection, site not specified: Secondary | ICD-10-CM | POA: Diagnosis not present

## 2021-06-27 DIAGNOSIS — M6281 Muscle weakness (generalized): Secondary | ICD-10-CM | POA: Diagnosis not present

## 2021-06-29 ENCOUNTER — Telehealth: Payer: Self-pay | Admitting: Cardiology

## 2021-06-29 MED ORDER — METOPROLOL SUCCINATE ER 25 MG PO TB24: 25.0000 mg | ORAL_TABLET | Freq: Every day | ORAL | 2 refills | Status: DC

## 2021-06-29 NOTE — Telephone Encounter (Signed)
Rx(s) sent to pharmacy electronically.  

## 2021-06-29 NOTE — Telephone Encounter (Signed)
Pt c/o medication issue:  1. Name of Medication: metoprolol succinate (TOPROL-XL) 25 MG 24 hr tablet  2. How are you currently taking this medication (dosage and times per day)? As written  3. Are you having a reaction (difficulty breathing--STAT)? No   4. What is your medication issue? Patient is out of medication and needs a new prescription

## 2021-07-07 DIAGNOSIS — Z0000 Encounter for general adult medical examination without abnormal findings: Secondary | ICD-10-CM | POA: Diagnosis not present

## 2021-07-07 DIAGNOSIS — R8281 Pyuria: Secondary | ICD-10-CM | POA: Diagnosis not present

## 2021-07-07 DIAGNOSIS — I700 Atherosclerosis of aorta: Secondary | ICD-10-CM | POA: Diagnosis not present

## 2021-07-07 DIAGNOSIS — I25810 Atherosclerosis of coronary artery bypass graft(s) without angina pectoris: Secondary | ICD-10-CM | POA: Diagnosis not present

## 2021-07-07 DIAGNOSIS — R7301 Impaired fasting glucose: Secondary | ICD-10-CM | POA: Diagnosis not present

## 2021-07-13 ENCOUNTER — Telehealth: Payer: Self-pay | Admitting: Cardiology

## 2021-07-13 ENCOUNTER — Other Ambulatory Visit: Payer: Self-pay

## 2021-07-13 MED ORDER — APIXABAN 2.5 MG PO TABS: 2.5000 mg | ORAL_TABLET | Freq: Two times a day (BID) | ORAL | 1 refills | Status: DC

## 2021-07-13 NOTE — Telephone Encounter (Signed)
°*  STAT* If patient is at the pharmacy, call can be transferred to refill team.   1. Which medications need to be refilled? (please list name of each medication and dose if known) apixaban (ELIQUIS) 2.5 MG TABS tablet  2. Which pharmacy/location (including street and city if local pharmacy) is medication to be sent to? [facility] [place], [place]  3. Do they need a 30 day or 90 day supply? 90 day   Patient only has a week left.

## 2021-07-13 NOTE — Telephone Encounter (Signed)
Prescription refill request for Eliquis received. Indication:Afib Last office visit:9/22 Scr:2.0 Age: 88 Weight:87.9 kg  Prescription refilled

## 2021-07-25 DIAGNOSIS — Z961 Presence of intraocular lens: Secondary | ICD-10-CM | POA: Diagnosis not present

## 2021-07-25 DIAGNOSIS — H35371 Puckering of macula, right eye: Secondary | ICD-10-CM | POA: Diagnosis not present

## 2021-07-25 DIAGNOSIS — H401111 Primary open-angle glaucoma, right eye, mild stage: Secondary | ICD-10-CM | POA: Diagnosis not present

## 2021-08-30 NOTE — Progress Notes (Signed)
? ? ? ? ?DDU:[id number] coronary artery disease and prior aortic valve replacement.  Patient is status post coronary artery bypass and graft in 1987 and had aortic valve replacement with a bioprosthetic valve in 2006. Patient has known peripheral vascular disease and was evaluated by Dr. Gwenlyn Found and medical therapy recommended. Had enterococcal bacteremia April 2020. Treated with antibiotics.  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  Had discitis May 2020. Had TIA May 2020. Transesophageal echocardiogram July 2020 showed normal LV function, mild to moderate mitral regurgitation, bioprosthetic aortic valve with no significant vegetation noted. CTA July 2020 showed 30% stenosis on the left, occluded left vertebral.  Nuclear study July 2020 showed ejection fraction 52%, inferior defect suggestive of scar versus diaphragmatic attenuation but no ischemia. Also with question of atrial fibrillation during previous hospitalization. Monitor October 2020 showed sinus bradycardia, normal sinus rhythm, sinus tachycardia, PACs including one nonconducted and PVCs.  Echocardiogram December 2022 showed ejection fraction 70 to 75%, mild basal septal hypertrophy, grade 1 diastolic dysfunction, mild left atrial enlargement, mild to moderate mitral regurgitation, previous aortic valve replacement with trace aortic insufficiency.  Patient treated for E. coli sepsis December 2022.  Metoprolol decreased to 25 mg daily at that time due to bradycardia.  Since last seen patient denies dyspnea, chest pain, palpitations or syncope.  No bleeding. ? ?Current Outpatient Medications  ?Medication Sig Dispense Refill  ? acetaminophen (TYLENOL) 500 MG tablet Take 500-1,000 mg by mouth every 8 (eight) hours as needed for mild pain or headache.    ? apixaban (ELIQUIS) 2.5 MG TABS tablet Take 1 tablet (2.5 mg total) by mouth 2 (two) times daily. 180 tablet 1  ? atorvastatin (LIPITOR) 40 MG tablet Take 1 tablet (40 mg total) by mouth daily.  (Patient taking differently: Take 20 mg by mouth 2 (two) times daily.) 90 tablet 1  ? Calcium Carbonate (CALCIUM 600 PO) Take 600 mg by mouth daily.    ? Cholecalciferol (VITAMIN D-3 PO) Take 1 capsule by mouth daily.    ? folic acid (FOLVITE) 623 MCG tablet Take 400 mcg by mouth daily.    ? Glucosamine HCl (GLUCOSAMINE PO) Take 1 tablet by mouth daily.    ? lisinopril (ZESTRIL) 20 MG tablet Take 1 tablet (20 mg total) by mouth daily. 90 tablet 1  ? metoprolol succinate (TOPROL-XL) 25 MG 24 hr tablet Take 1 tablet (25 mg total) by mouth daily. Take with or immediately following a meal. 90 tablet 2  ? Multiple Vitamin (THERAGRAN PO) Take 1 tablet by mouth daily.    ? timolol (TIMOPTIC) 0.5 % ophthalmic solution Place 1 drop into the right eye 2 (two) times daily.   3  ? vitamin C (ASCORBIC ACID) 500 MG tablet Take 500 mg by mouth daily.    ? Wheat Dextrin (BENEFIBER) POWD Take 1 packet by mouth 2 (two) times daily.    ? loperamide (IMODIUM) 2 MG capsule Take 2 mg by mouth 4 (four) times daily as needed.    ? pantoprazole (PROTONIX) 20 MG tablet Take 20 mg by mouth every morning. (Patient not taking: Reported on 09/07/2021)    ? saccharomyces boulardii (FLORASTOR) 250 MG capsule Take 1 capsule (250 mg total) by mouth 2 (two) times daily. (Patient not taking: Reported on 09/07/2021) 28 capsule 0  ? ?No current facility-administered medications for this visit.  ? ? ? ?Past Medical History:  ?Diagnosis Date  ? Aortic stenosis   ? a. 2006 s/p bioprosthetic AVR; b. 12/2018 TEE: EF  60-65%, Nl AoV fxn w/o dehiscence, regurgitation, perivalvular leak, or abscess.  ? Bacteremia due to Enterococcus 09/2018  ? a. ? discitis vs SBE; b. 12/2018 TEE: No evidence of vegetation, nl fxn'ing bioprosthetic AoV.  ? Cancer [facility])   ? Carotid arterial disease ([place])   ? a. 10/2018 Carotid U/S: 1-39% bilat ICA dzs.  ? Coronary artery disease   ? a. 1987 s/p CABG x 5; 05/2014 MV: EF 70%, no significant ischemia.  ? Discitis of lumbar region   ? a.  10/2018 MRI L2-3 acute discitis osteomyelitis.  ? Endocarditis   ? a. 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  ? History of prostatectomy   ? Hyperlipidemia   ? Hypertension   ? Lumbar spinal stenosis   ? a. 10/2018 MRI: mild L2-3 and mod L3-4 spinal canal stenosis.  ? PAD (peripheral artery disease) ([place]) 06/06/2017  ? TIA (transient ischemic attack)   ? a. 10/2018  ? Vertebral artery occlusion, left   ? a. 10/2018 CTA head/neck: Occlusion of prox V4 segment of the nondominant L vertebral artery - unknown chronicity.  ? ? ?Past Surgical History:  ?Procedure Laterality Date  ? CARDIAC CATHETERIZATION    ? EF of 66%  ? CARDIAC VALVE REPLACEMENT  2006  ? CORONARY ARTERY BYPASS GRAFT  1987  ? x5  ? I & D EXTREMITY Left 01/10/2015  ? Procedure: DEBRIDEMENT ANKLE PLACEMENT ANTIBIOTIC BEADS AND WOUND VAC;  Surgeon: [name], MD;  Location: [facility];  Service: Orthopedics;  Laterality: Left;  ? IR LUMBAR Home W/IMG GUIDE  11/10/2018  ? TEE WITHOUT CARDIOVERSION N/A 12/18/2018  ? Procedure: TRANSESOPHAGEAL ECHOCARDIOGRAM (TEE);  Surgeon: Jerline Pain, MD;  Location: [facility] ENDOSCOPY;  Service: Cardiovascular;  Laterality: N/A;  ? ? ?Social History  ? ?Socioeconomic History  ? Marital status: Married  ?  Spouse name: Not on file  ? Number of children: Not on file  ? Years of education: Not on file  ? Highest education level: Not on file  ?Occupational History  ? Not on file  ?Tobacco Use  ? Smoking status: Former  ?  Types: Cigarettes  ?  Quit date: 11/18/1955  ?  Years since quitting: 65.8  ? Smokeless tobacco: Never  ?Substance and Sexual Activity  ? Alcohol use: No  ? Drug use: No  ? Sexual activity: Not on file  ?Other Topics Concern  ? Not on file  ?Social History Narrative  ? Not on file  ? ?Social Determinants of Health  ? ?Financial Resource Strain: Not on file  ?Food Insecurity: Not on file  ?Transportation Needs: Not on file  ?Physical  Activity: Not on file  ?Stress: Not on file  ?Social Connections: Not on file  ?Intimate Partner Violence: Not on file  ? ? ?Family History  ?Problem Relation Age of Onset  ? Heart disease Father   ? Heart failure Mother   ? Heart attack Brother   ? Congestive Heart Failure Sister   ? Congestive Heart Failure Sister   ? ? ?ROS: no fevers or chills, productive cough, hemoptysis, dysphasia, odynophagia, melena, hematochezia, dysuria, hematuria, rash, seizure activity, orthopnea, PND, pedal edema, claudication. Remaining systems are negative. ? ?Physical Exam: ?Well-developed well-nourished in no acute distress.  ?Skin is warm and dry.  ?HEENT is normal.  ?Neck is supple.  ?Chest is clear to auscultation with normal expansion.  ?Cardiovascular exam is regular rate and rhythm.  2/6 systolic murmur  left sternal border.  No diastolic murmur. ?Abdominal exam nontender or distended. No masses palpated. ?Extremities show no edema. ?neuro grossly intact ? ?A/P ? 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 ? ?2 paroxysmal atrial fibrillation-we will continue beta-blocker at present dose.  Continue anticoagulation.  We will check potassium and renal function. ? ?3 coronary artery disease-plan to continue statin.  He is not on aspirin given need for apixaban.  He denies chest pain. ? ?4 hypertension-blood pressure controlled.  Continue present medications. ? ?5 hyperlipidemia-continue statin. ? ?6 peripheral vascular disease-continue statin. ? ?7 history of diarrhea-patient requested a prescription for loperamide today which she has taken in the past and we provided that. ? ?Kirk Ruths, MD ? ? ? ?

## 2021-09-07 ENCOUNTER — Ambulatory Visit: Payer: Medicare Other | Admitting: Cardiology

## 2021-09-07 ENCOUNTER — Encounter: Payer: Self-pay | Admitting: Cardiology

## 2021-09-07 ENCOUNTER — Other Ambulatory Visit: Payer: Self-pay

## 2021-09-07 VITALS — BP 140/82 | HR 68 | Ht 67.0 in | Wt 188.0 lb

## 2021-09-07 DIAGNOSIS — I10 Essential (primary) hypertension: Secondary | ICD-10-CM

## 2021-09-07 DIAGNOSIS — I25810 Atherosclerosis of coronary artery bypass graft(s) without angina pectoris: Secondary | ICD-10-CM | POA: Diagnosis not present

## 2021-09-07 DIAGNOSIS — Z952 Presence of prosthetic heart valve: Secondary | ICD-10-CM | POA: Diagnosis not present

## 2021-09-07 DIAGNOSIS — E785 Hyperlipidemia, unspecified: Secondary | ICD-10-CM

## 2021-09-07 DIAGNOSIS — I480 Paroxysmal atrial fibrillation: Secondary | ICD-10-CM | POA: Diagnosis not present

## 2021-09-07 MED ORDER — LOPERAMIDE HCL 2 MG PO CAPS: 2.0000 mg | ORAL_CAPSULE | Freq: Four times a day (QID) | ORAL | 0 refills | Status: DC | PRN

## 2021-09-07 NOTE — Patient Instructions (Signed)
?  Lab Work: ? ?Your physician recommends that you HAVE LAB [place], [address] ? ?If you have labs (blood work) drawn today and your tests are completely normal, you will receive your results only by: ?MyChart Message (if you have MyChart) OR ?A paper copy in the mail ?If you have any lab test that is abnormal or we need to change your treatment, we will call you to review the results. ? ? ?Follow-Up: ?At [facility], you and your health needs are our priority.  As part of our continuing mission to provide you with exceptional heart care, we have created designated Provider Care Teams.  These Care Teams include your primary Cardiologist (physician) and Advanced Practice Providers (APPs -  Physician Assistants and Nurse Practitioners) who all work together to provide you with the care you need, when you need it. ? ?We recommend signing up for the patient portal called "MyChart".  Sign up information is provided on this After Visit Summary.  MyChart is used to connect with patients for Virtual Visits (Telemedicine).  Patients are able to view lab/test results, encounter notes, upcoming appointments, etc.  Non-urgent messages can be sent to your provider as well.   ?To learn more about what you can do with MyChart, go to [email or web address].   ? ?Your next appointment:   ?6 month(s) ? ?The format for your next appointment:   ?In Person ? ?Provider:   ?Kirk Ruths, MD  ? ? ? ?

## 2021-09-08 LAB — BASIC METABOLIC PANEL
BUN/Creatinine Ratio: 16 (ref 10–24)
BUN: 30 mg/dL (ref 10–36)
CO2: 20 mmol/L (ref 20–29)
Calcium: 9.6 mg/dL (ref 8.6–10.2)
Chloride: 105 mmol/L (ref 96–106)
Creatinine, Ser: 1.88 mg/dL — ABNORMAL HIGH (ref 0.76–1.27)
Glucose: 101 mg/dL — ABNORMAL HIGH (ref 70–99)
Potassium: 5 mmol/L (ref 3.5–5.2)
Sodium: 141 mmol/L (ref 134–144)
eGFR: 33 mL/min/{1.73_m2} — ABNORMAL LOW (ref >59)

## 2021-12-15 ENCOUNTER — Other Ambulatory Visit: Payer: Self-pay | Admitting: *Deleted

## 2021-12-15 DIAGNOSIS — I480 Paroxysmal atrial fibrillation: Secondary | ICD-10-CM

## 2021-12-15 MED ORDER — APIXABAN 2.5 MG PO TABS: 2.5000 mg | ORAL_TABLET | Freq: Two times a day (BID) | ORAL | 1 refills | Status: DC

## 2021-12-15 NOTE — Telephone Encounter (Signed)
Eliquis 2.'5mg'$  refill request received. Patient is 88 years old, weight-85.3kg, Crea-1.88 on 09/07/2021, Diagnosis-Afib, and last seen by Dr. Stanford Breed on 09/07/2021. Dose is appropriate based on dosing criteria. Will send in refill to requested pharmacy.

## 2021-12-16 DIAGNOSIS — I700 Atherosclerosis of aorta: Secondary | ICD-10-CM | POA: Diagnosis not present

## 2021-12-16 DIAGNOSIS — I10 Essential (primary) hypertension: Secondary | ICD-10-CM | POA: Diagnosis not present

## 2021-12-16 DIAGNOSIS — I25810 Atherosclerosis of coronary artery bypass graft(s) without angina pectoris: Secondary | ICD-10-CM | POA: Diagnosis not present

## 2021-12-16 DIAGNOSIS — I480 Paroxysmal atrial fibrillation: Secondary | ICD-10-CM | POA: Diagnosis not present

## 2022-01-16 ENCOUNTER — Other Ambulatory Visit: Payer: Self-pay | Admitting: Cardiology

## 2022-01-19 DIAGNOSIS — I25810 Atherosclerosis of coronary artery bypass graft(s) without angina pectoris: Secondary | ICD-10-CM | POA: Diagnosis not present

## 2022-01-19 DIAGNOSIS — E7800 Pure hypercholesterolemia, unspecified: Secondary | ICD-10-CM | POA: Diagnosis not present

## 2022-01-19 DIAGNOSIS — I10 Essential (primary) hypertension: Secondary | ICD-10-CM | POA: Diagnosis not present

## 2022-01-19 DIAGNOSIS — I700 Atherosclerosis of aorta: Secondary | ICD-10-CM | POA: Diagnosis not present

## 2022-01-19 DIAGNOSIS — M8589 Other specified disorders of bone density and structure, multiple sites: Secondary | ICD-10-CM | POA: Diagnosis not present

## 2022-02-01 NOTE — Progress Notes (Signed)
HPI: Follow-up coronary artery disease and prior aortic valve replacement.  Patient is status post coronary artery bypass and graft in 1987 and had aortic valve replacement with a bioprosthetic valve in 2006. Patient has known peripheral vascular disease and was evaluated by Dr. Gwenlyn Found and medical therapy recommended. Had enterococcal bacteremia April 2020. Treated with antibiotics.  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  Had discitis May 2020. Had TIA May 2020. Transesophageal echocardiogram July 2020 showed normal LV function, mild to moderate mitral regurgitation, bioprosthetic aortic valve with no significant vegetation noted. CTA July 2020 showed 30% stenosis on the left, occluded left vertebral.  Nuclear study July 2020 showed ejection fraction 52%, inferior defect suggestive of scar versus diaphragmatic attenuation but no ischemia. Also with question of atrial fibrillation during previous hospitalization. Monitor October 2020 showed sinus bradycardia, normal sinus rhythm, sinus tachycardia, PACs including one nonconducted and PVCs.  Echocardiogram December 2022 showed ejection fraction 70 to 75%, mild basal septal hypertrophy, grade 1 diastolic dysfunction, mild left atrial enlargement, mild to moderate mitral regurgitation, previous aortic valve replacement with trace aortic insufficiency.  Patient treated for E. coli sepsis December 2022.  Metoprolol decreased to 25 mg daily at that time due to bradycardia.  Since last seen he denies dyspnea, chest pain, palpitations, syncope or bleeding.  He recently cut his arm and when removing the Band-Aid had some skin loss.  He has had continued bleeding since that time.  Current Outpatient Medications  Medication Sig Dispense Refill   acetaminophen (TYLENOL) 500 MG tablet Take 500-1,000 mg by mouth every 8 (eight) hours as needed for mild pain or headache.     apixaban (ELIQUIS) 2.5 MG TABS tablet Take 1 tablet (2.5 mg total) by mouth 2  (two) times daily. 180 tablet 1   atorvastatin (LIPITOR) 40 MG tablet TAKE 1/2 TABLET(20 MG) BY MOUTH IN THE MORNING AND AT BEDTIME 90 tablet 3   Calcium Carbonate (CALCIUM 600 PO) Take 600 mg by mouth daily.     Cholecalciferol (VITAMIN D-3 PO) Take 1 capsule by mouth daily.     folic acid (FOLVITE) 921 MCG tablet Take 400 mcg by mouth daily.     Glucosamine HCl (GLUCOSAMINE PO) Take 1 tablet by mouth daily.     lisinopril (ZESTRIL) 20 MG tablet Take 1 tablet (20 mg total) by mouth daily. 90 tablet 1   loperamide (IMODIUM) 2 MG capsule Take 1 capsule (2 mg total) by mouth 4 (four) times daily as needed. 90 capsule 0   metoprolol succinate (TOPROL-XL) 25 MG 24 hr tablet Take 1 tablet (25 mg total) by mouth daily. Take with or immediately following a meal. 90 tablet 2   Multiple Vitamin (THERAGRAN PO) Take 1 tablet by mouth daily.     pantoprazole (PROTONIX) 20 MG tablet Take 20 mg by mouth every morning.     saccharomyces boulardii (FLORASTOR) 250 MG capsule Take 1 capsule (250 mg total) by mouth 2 (two) times daily. 28 capsule 0   timolol (TIMOPTIC) 0.5 % ophthalmic solution Place 1 drop into the right eye 2 (two) times daily.   3   vitamin C (ASCORBIC ACID) 500 MG tablet Take 500 mg by mouth daily.     Wheat Dextrin (BENEFIBER) POWD Take 1 packet by mouth 2 (two) times daily.     No current facility-administered medications for this visit.     Past Medical History:  Diagnosis Date   Aortic stenosis    a. 2006 s/p  bioprosthetic AVR; b. 12/2018 TEE: EF 60-65%, Nl AoV fxn w/o dehiscence, regurgitation, perivalvular leak, or abscess.   Bacteremia due to Enterococcus 09/2018   a. ? discitis vs SBE; b. 12/2018 TEE: No evidence of vegetation, nl fxn'ing bioprosthetic AoV.   Cancer [facility])    Carotid arterial disease ([place])    a. 10/2018 Carotid U/S: 1-39% bilat ICA dzs.   Coronary artery disease    a. 1987 s/p CABG x 5; 05/2014 MV: EF 70%, no significant ischemia.   Discitis of lumbar region     a. 10/2018 MRI L2-3 acute discitis osteomyelitis.   Endocarditis    a. 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   History of prostatectomy    Hyperlipidemia    Hypertension    Lumbar spinal stenosis    a. 10/2018 MRI: mild L2-3 and mod L3-4 spinal canal stenosis.   PAD (peripheral artery disease) ([place]) 06/06/2017   TIA (transient ischemic attack)    a. 10/2018   Vertebral artery occlusion, left    a. 10/2018 CTA head/neck: Occlusion of prox V4 segment of the nondominant L vertebral artery - unknown chronicity.    Past Surgical History:  Procedure Laterality Date   CARDIAC CATHETERIZATION     EF of 66%   CARDIAC VALVE REPLACEMENT  2006   CORONARY ARTERY BYPASS GRAFT  1987   x5   I & D EXTREMITY Left 01/10/2015   Procedure: DEBRIDEMENT ANKLE PLACEMENT ANTIBIOTIC BEADS AND WOUND VAC;  Surgeon: [name], MD;  Location: [facility];  Service: Orthopedics;  Laterality: Left;   IR LUMBAR DISC ASPIRATION W/IMG GUIDE  11/10/2018   TEE WITHOUT CARDIOVERSION N/A 12/18/2018   Procedure: TRANSESOPHAGEAL ECHOCARDIOGRAM (TEE);  Surgeon: Jerline Pain, MD;  Location: [facility] ENDOSCOPY;  Service: Cardiovascular;  Laterality: N/A;    Social History   Socioeconomic History   Marital status: Married    Spouse name: Not on file   Number of children: Not on file   Years of education: Not on file   Highest education level: Not on file  Occupational History   Not on file  Tobacco Use   Smoking status: Former    Types: Cigarettes    Quit date: 11/18/1955    Years since quitting: 66.2   Smokeless tobacco: Never  Substance and Sexual Activity   Alcohol use: No   Drug use: No   Sexual activity: Not on file  Other Topics Concern   Not on file  Social History Narrative   Not on file   Social Determinants of Health   Financial Resource Strain: Not on file  Food Insecurity: Not on file  Transportation Needs: Not on file  Physical  Activity: Not on file  Stress: Not on file  Social Connections: Not on file  Intimate Partner Violence: Not on file    Family History  Problem Relation Age of Onset   Heart disease Father    Heart failure Mother    Heart attack Brother    Congestive Heart Failure Sister    Congestive Heart Failure Sister     ROS: no fevers or chills, productive cough, hemoptysis, dysphasia, odynophagia, melena, hematochezia, dysuria, hematuria, rash, seizure activity, orthopnea, PND, pedal edema, claudication. Remaining systems are negative.  Physical Exam: Well-developed well-nourished in no acute distress.  Skin is warm and dry.  HEENT is normal.  Neck is supple.  Chest is clear to auscultation with normal expansion.  Cardiovascular exam is regular rate  and rhythm.  2/6 systolic murmur left sternal border. Abdominal exam nontender or distended. No masses palpated. Extremities show no edema. 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  2 paroxysmal atrial fibrillation-patient remains in sinus rhythm on examination.  Continue beta-blocker at present dose of atrial fibrillation recurs.  Continue anticoagulation with apixaban.  Note he has a wound on his arm that continues to bleed despite pressure and bandage changes.  He may hold his apixaban for several days until this improves.  I will have his most recent laboratories forwarded to [place] from primary care.  3 coronary artery disease-he denies chest pain.  Continue statin.  No aspirin given need for apixaban.  4 hypertension-patient's blood pressure is elevated.  He will follow this and we will advance medications if needed.  5 hyperlipidemia-continue statin.  6 peripheral vascular disease-continue statin.  Kirk Ruths, MD

## 2022-02-15 ENCOUNTER — Ambulatory Visit: Payer: Medicare Other | Attending: Cardiology | Admitting: Cardiology

## 2022-02-15 ENCOUNTER — Encounter: Payer: Self-pay | Admitting: Cardiology

## 2022-02-15 VITALS — BP 162/80 | HR 63 | Ht 67.0 in | Wt 189.4 lb

## 2022-02-15 DIAGNOSIS — I25810 Atherosclerosis of coronary artery bypass graft(s) without angina pectoris: Secondary | ICD-10-CM

## 2022-02-15 DIAGNOSIS — I480 Paroxysmal atrial fibrillation: Secondary | ICD-10-CM | POA: Diagnosis not present

## 2022-02-15 DIAGNOSIS — I10 Essential (primary) hypertension: Secondary | ICD-10-CM

## 2022-02-15 DIAGNOSIS — Z952 Presence of prosthetic heart valve: Secondary | ICD-10-CM | POA: Diagnosis not present

## 2022-02-15 DIAGNOSIS — E785 Hyperlipidemia, unspecified: Secondary | ICD-10-CM

## 2022-02-15 NOTE — Patient Instructions (Signed)
  Follow-Up: At [facility], you and your health needs are our priority.  As part of our continuing mission to provide you with exceptional heart care, we have created designated Provider Care Teams.  These Care Teams include your primary Cardiologist (physician) and Advanced Practice Providers (APPs -  Physician Assistants and Nurse Practitioners) who all work together to provide you with the care you need, when you need it.  We recommend signing up for the patient portal called "MyChart".  Sign up information is provided on this After Visit Summary.  MyChart is used to connect with patients for Virtual Visits (Telemedicine).  Patients are able to view lab/test results, encounter notes, upcoming appointments, etc.  Non-urgent messages can be sent to your provider as well.   To learn more about what you can do with MyChart, go to [email or web address].    Your next appointment:   6 month(s)  The format for your next appointment:   In Person  Provider:   Brian Crenshaw, MD    

## 2022-02-21 ENCOUNTER — Encounter: Payer: Self-pay | Admitting: *Deleted

## 2022-02-22 DIAGNOSIS — R9721 Rising PSA following treatment for malignant neoplasm of prostate: Secondary | ICD-10-CM | POA: Diagnosis not present

## 2022-03-07 ENCOUNTER — Telehealth: Payer: Self-pay | Admitting: Cardiology

## 2022-03-07 MED ORDER — LOPERAMIDE HCL 2 MG PO CAPS: 2.0000 mg | ORAL_CAPSULE | Freq: Four times a day (QID) | ORAL | 3 refills | Status: AC | PRN

## 2022-03-07 NOTE — Telephone Encounter (Signed)
*  STAT* If patient is at the pharmacy, call can be transferred to refill team.   1. Which medications need to be refilled? (please list name of each medication and dose if known) Loperamide  2. Which pharmacy/location (including street and city if local pharmacy) is medication to be sent to? [facility]  3. Do they need a 30 day or 90 day supply? 90 days and refill

## 2022-04-10 ENCOUNTER — Other Ambulatory Visit: Payer: Self-pay | Admitting: Cardiology

## 2022-04-13 ENCOUNTER — Telehealth: Payer: Self-pay | Admitting: Cardiology

## 2022-04-13 MED ORDER — METOPROLOL SUCCINATE ER 25 MG PO TB24: 25.0000 mg | ORAL_TABLET | Freq: Every day | ORAL | 3 refills | Status: DC

## 2022-04-13 NOTE — Telephone Encounter (Signed)
  *  STAT* If patient is at the pharmacy, call can be transferred to refill team.   1. Which medications need to be refilled? (please list name of each medication and dose if known) metoprolol succinate (TOPROL-XL) 25 MG 24 hr tablet  2. Which pharmacy/location (including street and city if local pharmacy) is medication to be sent to? [facility] - [place], [place]  3. Do they need a 30 day or 90 day supply? 90 days

## 2022-06-06 ENCOUNTER — Telehealth: Payer: Self-pay | Admitting: Cardiology

## 2022-06-06 NOTE — Telephone Encounter (Signed)
Spoke with Melissa at [facility] who stated patient has not been taking lisinopril since September 2023. After chart review, patient has not received any lisinopril form mail order pharm for several months. Melissa did not have a recent BP. Please advise if patient is to be on lisinopril '20mg'$  daily.

## 2022-06-06 NOTE — Telephone Encounter (Signed)
Pt c/o medication issue:  1. Name of Medication:   lisinopril (ZESTRIL) 20 MG tablet    2. How are you currently taking this medication (dosage and times per day)?   3. Are you having a reaction (difficulty breathing--STAT)?   4. What is your medication issue? Pt PCP office called stating pt has not been taking this medication since September and pt told her that Dr. Stanford Breed took him off of it. Please advise if pt should still be taking this or not, she asked for a c/b

## 2022-06-06 NOTE — Telephone Encounter (Signed)
Left message for [facility], aware of dr Stanford Breed 's recommendations.

## 2022-06-28 ENCOUNTER — Other Ambulatory Visit: Payer: Self-pay | Admitting: Pharmacist

## 2022-06-28 DIAGNOSIS — I480 Paroxysmal atrial fibrillation: Secondary | ICD-10-CM

## 2022-06-28 MED ORDER — APIXABAN 2.5 MG PO TABS: 2.5000 mg | ORAL_TABLET | Freq: Two times a day (BID) | ORAL | 1 refills | Status: DC

## 2022-07-19 DIAGNOSIS — Z125 Encounter for screening for malignant neoplasm of prostate: Secondary | ICD-10-CM | POA: Diagnosis not present

## 2022-07-19 DIAGNOSIS — R7301 Impaired fasting glucose: Secondary | ICD-10-CM | POA: Diagnosis not present

## 2022-07-19 DIAGNOSIS — I700 Atherosclerosis of aorta: Secondary | ICD-10-CM | POA: Diagnosis not present

## 2022-07-19 DIAGNOSIS — E7800 Pure hypercholesterolemia, unspecified: Secondary | ICD-10-CM | POA: Diagnosis not present

## 2022-07-22 ENCOUNTER — Other Ambulatory Visit: Payer: Self-pay | Admitting: Cardiology

## 2022-07-26 ENCOUNTER — Other Ambulatory Visit: Payer: Self-pay | Admitting: Internal Medicine

## 2022-07-26 ENCOUNTER — Telehealth: Payer: Self-pay | Admitting: Cardiology

## 2022-07-26 DIAGNOSIS — N184 Chronic kidney disease, stage 4 (severe): Secondary | ICD-10-CM

## 2022-07-26 DIAGNOSIS — Z0000 Encounter for general adult medical examination without abnormal findings: Secondary | ICD-10-CM | POA: Diagnosis not present

## 2022-07-26 DIAGNOSIS — R82998 Other abnormal findings in urine: Secondary | ICD-10-CM | POA: Diagnosis not present

## 2022-07-26 DIAGNOSIS — D6869 Other thrombophilia: Secondary | ICD-10-CM | POA: Diagnosis not present

## 2022-07-26 DIAGNOSIS — I700 Atherosclerosis of aorta: Secondary | ICD-10-CM | POA: Diagnosis not present

## 2022-07-26 DIAGNOSIS — Z1339 Encounter for screening examination for other mental health and behavioral disorders: Secondary | ICD-10-CM | POA: Diagnosis not present

## 2022-07-26 DIAGNOSIS — Z1331 Encounter for screening for depression: Secondary | ICD-10-CM | POA: Diagnosis not present

## 2022-07-26 DIAGNOSIS — I480 Paroxysmal atrial fibrillation: Secondary | ICD-10-CM | POA: Diagnosis not present

## 2022-07-26 MED ORDER — LISINOPRIL 20 MG PO TABS: 20.0000 mg | ORAL_TABLET | Freq: Every day | ORAL | 0 refills | Status: DC

## 2022-07-26 NOTE — Telephone Encounter (Signed)
*  STAT* If patient is at the pharmacy, call can be transferred to refill team.   1. Which medications need to be refilled? (please list name of each medication and dose if known) lisinopril (ZESTRIL) 20 MG tablet   2. Which pharmacy/location (including street and city if local pharmacy) is medication to be sent to?   [facility] - [place], [place]    3. Do they need a 30 day or 90 day supply?  [place]

## 2022-07-31 DIAGNOSIS — Z961 Presence of intraocular lens: Secondary | ICD-10-CM | POA: Diagnosis not present

## 2022-07-31 DIAGNOSIS — H401111 Primary open-angle glaucoma, right eye, mild stage: Secondary | ICD-10-CM | POA: Diagnosis not present

## 2022-07-31 DIAGNOSIS — H35371 Puckering of macula, right eye: Secondary | ICD-10-CM | POA: Diagnosis not present

## 2022-08-09 NOTE — Progress Notes (Signed)
HPI: Follow-up coronary artery disease and prior aortic valve replacement.  Patient is status post coronary artery bypass and graft in 1987 and had aortic valve replacement with a bioprosthetic valve in 2006. Patient has known peripheral vascular disease and was evaluated by Dr. Gwenlyn Found and medical therapy recommended. Had enterococcal bacteremia April 2020. Treated with antibiotics.  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  Had discitis May 2020. Had TIA May 2020. Transesophageal echocardiogram July 2020 showed normal LV function, mild to moderate mitral regurgitation, bioprosthetic aortic valve with no significant vegetation noted. CTA July 2020 showed 30% stenosis on the left, occluded left vertebral.  Nuclear study July 2020 showed ejection fraction 52%, inferior defect suggestive of scar versus diaphragmatic attenuation but no ischemia. Also with question of atrial fibrillation during previous hospitalization. Monitor October 2020 showed sinus bradycardia, normal sinus rhythm, sinus tachycardia, PACs including one nonconducted and PVCs.  Echocardiogram December 2022 showed ejection fraction 70 to 75%, mild basal septal hypertrophy, grade 1 diastolic dysfunction, mild left atrial enlargement, mild to moderate mitral regurgitation, previous aortic valve replacement with trace aortic insufficiency.  Patient treated for E. coli sepsis December 2022.  Metoprolol decreased to 25 mg daily at that time due to bradycardia.  Since last seen patient denies dyspnea, chest pain, palpitations or syncope.  Current Outpatient Medications  Medication Sig Dispense Refill   acetaminophen (TYLENOL) 500 MG tablet Take 500-1,000 mg by mouth every 8 (eight) hours as needed for mild pain or headache.     apixaban (ELIQUIS) 2.5 MG TABS tablet Take 1 tablet (2.5 mg total) by mouth 2 (two) times daily. 180 tablet 1   atorvastatin (LIPITOR) 40 MG tablet TAKE 1 TABLET BY MOUTH DAILY 90 tablet 3   Calcium Carbonate  (CALCIUM 600 PO) Take 600 mg by mouth daily.     Cholecalciferol (VITAMIN D-3 PO) Take 1 capsule by mouth daily.     famotidine (PEPCID) 20 MG tablet Take 20 mg by mouth 2 (two) times daily.     folic acid (FOLVITE) A999333 MCG tablet Take 400 mcg by mouth daily.     Glucosamine HCl (GLUCOSAMINE PO) Take 1 tablet by mouth daily.     lisinopril (ZESTRIL) 20 MG tablet Take 1 tablet (20 mg total) by mouth daily. Please keep scheduled appointment with cardiologist. 90 tablet 0   loperamide (IMODIUM) 2 MG capsule Take 1 capsule (2 mg total) by mouth 4 (four) times daily as needed. 90 capsule 3   metoprolol succinate (TOPROL-XL) 25 MG 24 hr tablet Take 1 tablet (25 mg total) by mouth daily. Take with or immediately following a meal. 90 tablet 3   Multiple Vitamin (THERAGRAN PO) Take 1 tablet by mouth daily.     saccharomyces boulardii (FLORASTOR) 250 MG capsule Take 1 capsule (250 mg total) by mouth 2 (two) times daily. 28 capsule 0   timolol (TIMOPTIC) 0.5 % ophthalmic solution Place 1 drop into the right eye 2 (two) times daily.   3   vitamin C (ASCORBIC ACID) 500 MG tablet Take 500 mg by mouth daily.     Wheat Dextrin (BENEFIBER) POWD Take 1 packet by mouth 2 (two) times daily.     No current facility-administered medications for this visit.     Past Medical History:  Diagnosis Date   Aortic stenosis    a. 2006 s/p bioprosthetic AVR; b. 12/2018 TEE: EF 60-65%, Nl AoV fxn w/o dehiscence, regurgitation, perivalvular leak, or abscess.   Bacteremia due to Enterococcus 09/2018  a. ? discitis vs SBE; b. 12/2018 TEE: No evidence of vegetation, nl fxn'ing bioprosthetic AoV.   Cancer [facility])    Carotid arterial disease ([place])    a. 10/2018 Carotid U/S: 1-39% bilat ICA dzs.   Coronary artery disease    a. 1987 s/p CABG x 5; 05/2014 MV: EF 70%, no significant ischemia.   Discitis of lumbar region    a. 10/2018 MRI L2-3 acute discitis osteomyelitis.   Endocarditis    a. 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   History of prostatectomy    Hyperlipidemia    Hypertension    Lumbar spinal stenosis    a. 10/2018 MRI: mild L2-3 and mod L3-4 spinal canal stenosis.   PAD (peripheral artery disease) ([place]) 06/06/2017   TIA (transient ischemic attack)    a. 10/2018   Vertebral artery occlusion, left    a. 10/2018 CTA head/neck: Occlusion of prox V4 segment of the nondominant L vertebral artery - unknown chronicity.    Past Surgical History:  Procedure Laterality Date   CARDIAC CATHETERIZATION     EF of 66%   CARDIAC VALVE REPLACEMENT  2006   CORONARY ARTERY BYPASS GRAFT  1987   x5   I & D EXTREMITY Left 01/10/2015   Procedure: DEBRIDEMENT ANKLE PLACEMENT ANTIBIOTIC BEADS AND WOUND VAC;  Surgeon: [name], MD;  Location: [facility];  Service: Orthopedics;  Laterality: Left;   IR LUMBAR DISC ASPIRATION W/IMG GUIDE  11/10/2018   TEE WITHOUT CARDIOVERSION N/A 12/18/2018   Procedure: TRANSESOPHAGEAL ECHOCARDIOGRAM (TEE);  Surgeon: Jerline Pain, MD;  Location: [facility] ENDOSCOPY;  Service: Cardiovascular;  Laterality: N/A;    Social History   Socioeconomic History   Marital status: Married    Spouse name: Not on file   Number of children: Not on file   Years of education: Not on file   Highest education level: Not on file  Occupational History   Not on file  Tobacco Use   Smoking status: Former    Types: Cigarettes    Quit date: 11/18/1955    Years since quitting: 66.8   Smokeless tobacco: Never  Substance and Sexual Activity   Alcohol use: No   Drug use: No   Sexual activity: Not on file  Other Topics Concern   Not on file  Social History Narrative   Not on file   Social Determinants of Health   Financial Resource Strain: Not on file  Food Insecurity: Not on file  Transportation Needs: Not on file  Physical Activity: Not on file  Stress: Not on file  Social Connections: Not on file  Intimate Partner Violence: Not on  file    Family History  Problem Relation Age of Onset   Heart disease Father    Heart failure Mother    Heart attack Brother    Congestive Heart Failure Sister    Congestive Heart Failure Sister     ROS: no fevers or chills, productive cough, hemoptysis, dysphasia, odynophagia, melena, hematochezia, dysuria, hematuria, rash, seizure activity, orthopnea, PND, pedal edema, claudication. Remaining systems are negative.  Physical Exam: Well-developed well-nourished in no acute distress.  Skin is warm and dry.  HEENT is normal.  Neck is supple.  Chest is clear to auscultation with normal expansion.  Cardiovascular exam is irregular, 2/6 systolic murmur left sternal border.  No diastolic murmur. Abdominal exam nontender or distended. No masses palpated. Extremities show no edema. neuro grossly intact  ECG-normal  sinus rhythm with occasional PVC, nonspecific ST changes.  Personally reviewed  A/P  1 paroxysmal atrial fibrillation-patient remains in sinus rhythm.  Continue beta-blocker and apixaban.  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  3 coronary artery disease-he denies chest pain.  Continue statin.  No aspirin given need for anticoagulation.  4 hypertension-blood pressure elevated; however he states typically controlled.  Continue present medications and follow.  5 hyperlipidemia-continue statin.  6 peripheral vascular disease-we will continue statin.  Kirk Ruths, MD

## 2022-08-22 ENCOUNTER — Ambulatory Visit
Admission: RE | Admit: 2022-08-22 | Discharge: 2022-08-22 | Disposition: A | Discharge status: Home / Self Care | Payer: Medicare Other | Source: Ambulatory Visit | Attending: Internal Medicine | Admitting: Internal Medicine

## 2022-08-22 DIAGNOSIS — N189 Chronic kidney disease, unspecified: Secondary | ICD-10-CM | POA: Diagnosis not present

## 2022-08-22 DIAGNOSIS — N184 Chronic kidney disease, stage 4 (severe): Secondary | ICD-10-CM

## 2022-08-23 ENCOUNTER — Encounter: Payer: Self-pay | Admitting: Cardiology

## 2022-08-23 ENCOUNTER — Ambulatory Visit: Payer: Medicare Other | Attending: Cardiology | Admitting: Cardiology

## 2022-08-23 VITALS — BP 152/84 | HR 62 | Ht 67.0 in | Wt 191.1 lb

## 2022-08-23 DIAGNOSIS — I10 Essential (primary) hypertension: Secondary | ICD-10-CM

## 2022-08-23 DIAGNOSIS — Z952 Presence of prosthetic heart valve: Secondary | ICD-10-CM | POA: Diagnosis not present

## 2022-08-23 DIAGNOSIS — E785 Hyperlipidemia, unspecified: Secondary | ICD-10-CM

## 2022-08-23 DIAGNOSIS — I25810 Atherosclerosis of coronary artery bypass graft(s) without angina pectoris: Secondary | ICD-10-CM

## 2022-08-23 DIAGNOSIS — I480 Paroxysmal atrial fibrillation: Secondary | ICD-10-CM

## 2022-08-23 NOTE — Patient Instructions (Signed)
    Follow-Up: At [facility], you and your health needs are our priority.  As part of our continuing mission to provide you with exceptional heart care, we have created designated Provider Care Teams.  These Care Teams include your primary Cardiologist (physician) and Advanced Practice Providers (APPs -  Physician Assistants and Nurse Practitioners) who all work together to provide you with the care you need, when you need it.  We recommend signing up for the patient portal called "MyChart".  Sign up information is provided on this After Visit Summary.  MyChart is used to connect with patients for Virtual Visits (Telemedicine).  Patients are able to view lab/test results, encounter notes, upcoming appointments, etc.  Non-urgent messages can be sent to your provider as well.   To learn more about what you can do with MyChart, go to [email or web address].    Your next appointment:   6 month(s)  Provider:   Kirk Ruths, MD

## 2022-09-19 DIAGNOSIS — N184 Chronic kidney disease, stage 4 (severe): Secondary | ICD-10-CM | POA: Diagnosis not present

## 2022-09-19 DIAGNOSIS — I129 Hypertensive chronic kidney disease with stage 1 through stage 4 chronic kidney disease, or unspecified chronic kidney disease: Secondary | ICD-10-CM | POA: Diagnosis not present

## 2022-09-29 DIAGNOSIS — R9721 Rising PSA following treatment for malignant neoplasm of prostate: Secondary | ICD-10-CM | POA: Diagnosis not present

## 2022-10-06 DIAGNOSIS — R351 Nocturia: Secondary | ICD-10-CM | POA: Diagnosis not present

## 2022-10-06 DIAGNOSIS — R9721 Rising PSA following treatment for malignant neoplasm of prostate: Secondary | ICD-10-CM | POA: Diagnosis not present

## 2022-11-09 DIAGNOSIS — H903 Sensorineural hearing loss, bilateral: Secondary | ICD-10-CM | POA: Diagnosis not present

## 2022-11-27 ENCOUNTER — Other Ambulatory Visit: Payer: Self-pay | Admitting: Cardiology

## 2022-12-19 ENCOUNTER — Other Ambulatory Visit: Payer: Self-pay

## 2022-12-19 DIAGNOSIS — I480 Paroxysmal atrial fibrillation: Secondary | ICD-10-CM

## 2022-12-19 MED ORDER — APIXABAN 2.5 MG PO TABS: 2.5000 mg | ORAL_TABLET | Freq: Two times a day (BID) | ORAL | 1 refills | Status: DC

## 2022-12-19 NOTE — Telephone Encounter (Signed)
Prescription refill request for Eliquis received. Indication:afib Last office visit:3/24 Scr:0.62  3/24 Age: 89 Weight:86.7  kg  PRESCRIPTION REFILLED

## 2022-12-20 DIAGNOSIS — D6869 Other thrombophilia: Secondary | ICD-10-CM | POA: Diagnosis not present

## 2022-12-20 DIAGNOSIS — Z7901 Long term (current) use of anticoagulants: Secondary | ICD-10-CM | POA: Diagnosis not present

## 2023-01-15 DIAGNOSIS — H918X3 Other specified hearing loss, bilateral: Secondary | ICD-10-CM | POA: Diagnosis not present

## 2023-01-24 DIAGNOSIS — I700 Atherosclerosis of aorta: Secondary | ICD-10-CM | POA: Diagnosis not present

## 2023-01-24 DIAGNOSIS — I129 Hypertensive chronic kidney disease with stage 1 through stage 4 chronic kidney disease, or unspecified chronic kidney disease: Secondary | ICD-10-CM | POA: Diagnosis not present

## 2023-02-01 DIAGNOSIS — Z961 Presence of intraocular lens: Secondary | ICD-10-CM | POA: Diagnosis not present

## 2023-02-01 DIAGNOSIS — H401111 Primary open-angle glaucoma, right eye, mild stage: Secondary | ICD-10-CM | POA: Diagnosis not present

## 2023-02-12 ENCOUNTER — Other Ambulatory Visit: Payer: Self-pay | Admitting: Cardiology

## 2023-02-12 NOTE — Progress Notes (Signed)
HPI: Follow-up coronary artery disease and prior aortic valve replacement.  Patient is status post coronary artery bypass and graft in 1987 and had aortic valve replacement with a bioprosthetic valve in 2006. Patient has known peripheral vascular disease and was evaluated by Dr. Allyson Sabal and medical therapy recommended. Had enterococcal bacteremia April 2020. Treated with antibiotics.  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  Had discitis May 2020. Had TIA May 2020. Transesophageal echocardiogram July 2020 showed normal LV function, mild to moderate mitral regurgitation, bioprosthetic aortic valve with no significant vegetation noted. CTA July 2020 showed 30% stenosis on the left, occluded left vertebral.  Nuclear study July 2020 showed ejection fraction 52%, inferior defect suggestive of scar versus diaphragmatic attenuation but no ischemia. Also with question of atrial fibrillation during previous hospitalization. Monitor October 2020 showed sinus bradycardia, normal sinus rhythm, sinus tachycardia, PACs including one nonconducted and PVCs.  Echocardiogram December 2022 showed ejection fraction 70 to 75%, mild basal septal hypertrophy, grade 1 diastolic dysfunction, mild left atrial enlargement, mild to moderate mitral regurgitation, previous aortic valve replacement with trace aortic insufficiency.  Patient treated for E. coli sepsis December 2022.  Metoprolol decreased to 25 mg daily at that time due to bradycardia.  Since last seen patient denies dyspnea, chest pain, palpitations or syncope.  Current Outpatient Medications  Medication Sig Dispense Refill   acetaminophen (TYLENOL) 500 MG tablet Take 500-1,000 mg by mouth every 8 (eight) hours as needed for mild pain or headache.     apixaban (ELIQUIS) 2.5 MG TABS tablet Take 1 tablet (2.5 mg total) by mouth 2 (two) times daily. 180 tablet 1   atorvastatin (LIPITOR) 40 MG tablet TAKE 1 TABLET BY MOUTH DAILY 90 tablet 3   Calcium Carbonate  (CALCIUM 600 PO) Take 600 mg by mouth daily.     Cholecalciferol (VITAMIN D-3 PO) Take 1 capsule by mouth daily.     famotidine (PEPCID) 20 MG tablet Take 20 mg by mouth 2 (two) times daily.     folic acid (FOLVITE) 400 MCG tablet Take 400 mcg by mouth daily.     Glucosamine HCl (GLUCOSAMINE PO) Take 1 tablet by mouth daily.     lisinopril (ZESTRIL) 20 MG tablet TAKE 1 TABLET BY MOUTH DAILY. PLEASE KEEP SCHEDULED APPOINTMENT WITH CARDIOLOGIST. GENERIC EQUIVALENT FOR ZESTRIL 90 tablet 0   loperamide (IMODIUM) 2 MG capsule Take 1 capsule (2 mg total) by mouth 4 (four) times daily as needed. 90 capsule 3   metoprolol succinate (TOPROL-XL) 25 MG 24 hr tablet Take 1 tablet (25 mg total) by mouth daily. TAKE WITH OR IMMEDIATELY FOLLOWING A MEAL. 90 tablet 0   Multiple Vitamin (THERAGRAN PO) Take 1 tablet by mouth daily.     saccharomyces boulardii (FLORASTOR) 250 MG capsule Take 1 capsule (250 mg total) by mouth 2 (two) times daily. 28 capsule 0   timolol (TIMOPTIC) 0.5 % ophthalmic solution Place 1 drop into the right eye 2 (two) times daily.   3   vitamin C (ASCORBIC ACID) 500 MG tablet Take 500 mg by mouth daily.     Wheat Dextrin (BENEFIBER) POWD Take 1 packet by mouth 2 (two) times daily.     No current facility-administered medications for this visit.     Past Medical History:  Diagnosis Date   Aortic stenosis    a. 2006 s/p bioprosthetic AVR; b. 12/2018 TEE: EF 60-65%, Nl AoV fxn w/o dehiscence, regurgitation, perivalvular leak, or abscess.   Bacteremia due to Enterococcus  09/2018   a. ? discitis vs SBE; b. 12/2018 TEE: No evidence of vegetation, nl fxn'ing bioprosthetic AoV.   Cancer [facility])    Carotid arterial disease ([facility])    a. 10/2018 Carotid U/S: 1-39% bilat ICA dzs.   Coronary artery disease    a. 1987 s/p CABG x 5; 05/2014 MV: EF 70%, no significant ischemia.   Discitis of lumbar region    a. 10/2018 MRI L2-3 acute discitis osteomyelitis.   Endocarditis    a. 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   History of prostatectomy    Hyperlipidemia    Hypertension    Lumbar spinal stenosis    a. 10/2018 MRI: mild L2-3 and mod L3-4 spinal canal stenosis.   PAD (peripheral artery disease) ([facility]) 06/06/2017   TIA (transient ischemic attack)    a. 10/2018   Vertebral artery occlusion, left    a. 10/2018 CTA head/neck: Occlusion of prox V4 segment of the nondominant L vertebral artery - unknown chronicity.    Past Surgical History:  Procedure Laterality Date   CARDIAC CATHETERIZATION     EF of 66%   CARDIAC VALVE REPLACEMENT  2006   CORONARY ARTERY BYPASS GRAFT  1987   x5   I & D EXTREMITY Left 01/10/2015   Procedure: DEBRIDEMENT ANKLE PLACEMENT ANTIBIOTIC BEADS AND WOUND VAC;  Surgeon: Nadara Mustard, MD;  Location: [facility];  Service: Orthopedics;  Laterality: Left;   IR LUMBAR DISC ASPIRATION W/IMG GUIDE  11/10/2018   TEE WITHOUT CARDIOVERSION N/A 12/18/2018   Procedure: TRANSESOPHAGEAL ECHOCARDIOGRAM (TEE);  Surgeon: Jake Bathe, MD;  Location: [facility] END[facility]COPY;  Service: Cardiovascular;  Laterality: N/A;    Social History   Socioeconomic History   Marital status: Married    Spouse name: Not on file   Number of children: Not on file   Years of education: Not on file   Highest education level: Not on file  Occupational History   Not on file  Tobacco Use   Smoking status: Former    Current packs/day: 0.00    Types: Cigarettes    Quit date: 11/18/1955    Years since quitting: 67.3   Smokeless tobacco: Never  Substance and Sexual Activity   Alcohol use: No   Drug use: No   Sexual activity: Not on file  Other Topics Concern   Not on file  Social History Narrative   Not on file   Social Determinants of Health   Financial Resource Strain: Low Risk  (08/25/2022)   Received from [facility], [facility]   Overall Financial Resource Strain (CARDIA)    Difficulty of Paying Living  Expenses: Not hard at all  Food Insecurity: No Food Insecurity (08/25/2022)   Received from [facility], [facility]   Hunger Vital Sign    Worried About Running Out of Food in the Last Year: Never true    Ran Out of Food in the Last Year: Never true  Transportation Needs: No Transportation Needs (08/25/2022)   Received from [facility], [facility]   [facility] - Transportation    Lack of Transportation (Medical): No    Lack of Transportation (Non-Medical): No  Physical Activity: Not on file  Stress: Not on file  Social Connections: Not on file  Intimate Partner Violence: Not on file    Family History  Problem Relation Age of Onset   Heart disease Father    Heart failure  Mother    Heart attack Brother    Congestive Heart Failure Sister    Congestive Heart Failure Sister     ROS: no fevers or chills, productive cough, hemoptysis, dysphasia, odynophagia, melena, hematochezia, dysuria, hematuria, rash, seizure activity, orthopnea, PND, pedal edema, claudication. Remaining systems are negative.  Physical Exam: Well-developed well-nourished in no acute distress.  Skin is warm and dry.  HEENT is normal.  Neck is supple.  Chest is clear to auscultation with normal expansion.  Cardiovascular exam is irregular, 2/6 systolic murmur left sternal border.  No diastolic murmur. Abdominal exam nontender or distended. No masses palpated. Extremities show no edema. neuro grossly intact   A/P  1 paroxysmal atrial fibrillation- Will continue apixaban and beta-blocker at present dose.  2 coronary artery disease-continue statin.  He is not on aspirin given need for anticoagulation.  3 hyperlipidemia-continue statin.  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  5 peripheral vascular disease-continue statin.  6 hypertension-blood pressure controlled.  Continue present  medical regimen.  Randy Millers, MD

## 2023-02-14 ENCOUNTER — Other Ambulatory Visit: Payer: Self-pay | Admitting: *Deleted

## 2023-02-14 MED ORDER — METOPROLOL SUCCINATE ER 25 MG PO TB24: 25.0000 mg | ORAL_TABLET | Freq: Every day | ORAL | 0 refills | Status: DC

## 2023-02-21 ENCOUNTER — Ambulatory Visit: Payer: Medicare Other | Attending: Cardiology | Admitting: Cardiology

## 2023-02-21 ENCOUNTER — Encounter: Payer: Self-pay | Admitting: Cardiology

## 2023-02-21 VITALS — BP 154/72 | HR 76 | Ht 67.0 in | Wt 188.4 lb

## 2023-02-21 DIAGNOSIS — I480 Paroxysmal atrial fibrillation: Secondary | ICD-10-CM

## 2023-02-21 DIAGNOSIS — Z952 Presence of prosthetic heart valve: Secondary | ICD-10-CM

## 2023-02-21 DIAGNOSIS — I25810 Atherosclerosis of coronary artery bypass graft(s) without angina pectoris: Secondary | ICD-10-CM

## 2023-02-21 DIAGNOSIS — E785 Hyperlipidemia, unspecified: Secondary | ICD-10-CM

## 2023-02-21 DIAGNOSIS — I10 Essential (primary) hypertension: Secondary | ICD-10-CM | POA: Diagnosis not present

## 2023-02-21 NOTE — Patient Instructions (Signed)
Medication Instructions:   Your physician recommends that you continue on your current medications as directed. Please refer to the Current Medication list given to you today.   *If you need a refill on your cardiac medications before your next appointment, please call your pharmacy*   Lab Work:  None ordered.  If you have labs (blood work) drawn today and your tests are completely normal, you will receive your results only by: MyChart Message (if you have MyChart) OR A paper copy in the mail If you have any lab test that is abnormal or we need to change your treatment, we will call you to review the results.   Testing/Procedures:  None ordered.   Follow-Up: At [facility], you and your health needs are our priority.  As part of our continuing mission to provide you with exceptional heart care, we have created designated Provider Care Teams.  These Care Teams include your primary Cardiologist (physician) and Advanced Practice Providers (APPs -  Physician Assistants and Nurse Practitioners) who all work together to provide you with the care you need, when you need it.  We recommend signing up for the patient portal called "MyChart".  Sign up information is provided on this After Visit Summary.  MyChart is used to connect with patients for Virtual Visits (Telemedicine).  Patients are able to view lab/test results, encounter notes, upcoming appointments, etc.  Non-urgent messages can be sent to your provider as well.   To learn more about what you can do with MyChart, go to [email or web address].    Your next appointment:   6 month(s)  Provider:   Olga Millers, MD

## 2023-04-02 ENCOUNTER — Other Ambulatory Visit: Payer: Self-pay

## 2023-04-02 NOTE — Telephone Encounter (Signed)
Pt's pharmacy is requesting a refill on Loperamide. This is not a cardiac medication. Would Dr. Jens Som like to refill this medication? Please address

## 2023-04-02 NOTE — Telephone Encounter (Signed)
Should be OTC or from medical doctor

## 2023-04-03 DIAGNOSIS — N184 Chronic kidney disease, stage 4 (severe): Secondary | ICD-10-CM | POA: Diagnosis not present

## 2023-04-09 ENCOUNTER — Other Ambulatory Visit: Payer: Self-pay | Admitting: Cardiology

## 2023-04-10 DIAGNOSIS — I129 Hypertensive chronic kidney disease with stage 1 through stage 4 chronic kidney disease, or unspecified chronic kidney disease: Secondary | ICD-10-CM | POA: Diagnosis not present

## 2023-04-10 DIAGNOSIS — N184 Chronic kidney disease, stage 4 (severe): Secondary | ICD-10-CM | POA: Diagnosis not present

## 2023-04-11 ENCOUNTER — Telehealth: Payer: Self-pay | Admitting: Cardiology

## 2023-04-11 DIAGNOSIS — R9721 Rising PSA following treatment for malignant neoplasm of prostate: Secondary | ICD-10-CM | POA: Diagnosis not present

## 2023-04-11 NOTE — Telephone Encounter (Signed)
*  STAT* If patient is at the pharmacy, call can be transferred to refill team.   1. Which medications need to be refilled? (please list name of each medication and dose if known) loperamide (IMODIUM) 2 MG capsule    2. Would you like to learn more about the convenience, safety, & potential cost savings by using the [facility]? No    3. Are you open to using the [facility] No   4. Which pharmacy/location (including street and city if local pharmacy) is medication to be sent to? [facility] - [place], [place] - [address]     5. Do they need a 30 day or 90 day supply? 90 Day Supply

## 2023-04-12 NOTE — Telephone Encounter (Signed)
Called pt pharmacy and the pt to inform them that pt needed to contact PCP for a refill on non cardiac medication loperamide or get medication over the counter, per Dr. Ludwig Clarks nurse Deliah Goody, RN. Pt and pharmacy verbalized understanding.

## 2023-04-18 DIAGNOSIS — R351 Nocturia: Secondary | ICD-10-CM | POA: Diagnosis not present

## 2023-04-18 DIAGNOSIS — C61 Malignant neoplasm of prostate: Secondary | ICD-10-CM | POA: Diagnosis not present

## 2023-05-02 DIAGNOSIS — K080 Exfoliation of teeth due to systemic causes: Secondary | ICD-10-CM | POA: Diagnosis not present

## 2023-07-11 ENCOUNTER — Other Ambulatory Visit: Payer: Self-pay | Admitting: Cardiology

## 2023-07-11 DIAGNOSIS — I480 Paroxysmal atrial fibrillation: Secondary | ICD-10-CM

## 2023-07-12 ENCOUNTER — Other Ambulatory Visit: Payer: Self-pay | Admitting: Cardiology

## 2023-07-12 NOTE — Telephone Encounter (Signed)
Prescription refill request for Eliquis received. Indication: Afib  Last office visit: 02/21/23 (Crenshaw)  Scr: 0.62 (08/25/22)  Age: 89 Weight: 85.5kg  Per dosing criteria, pt's current dose is not appropriate. Will forward to pharmD to review.

## 2023-07-13 NOTE — Telephone Encounter (Signed)
Scr 2.11 on 04/03/23. Will continue current dose

## 2023-07-23 DIAGNOSIS — D649 Anemia, unspecified: Secondary | ICD-10-CM | POA: Diagnosis not present

## 2023-07-23 DIAGNOSIS — M8580 Other specified disorders of bone density and structure, unspecified site: Secondary | ICD-10-CM | POA: Diagnosis not present

## 2023-07-23 DIAGNOSIS — C61 Malignant neoplasm of prostate: Secondary | ICD-10-CM | POA: Diagnosis not present

## 2023-07-23 DIAGNOSIS — D638 Anemia in other chronic diseases classified elsewhere: Secondary | ICD-10-CM | POA: Diagnosis not present

## 2023-07-23 DIAGNOSIS — N184 Chronic kidney disease, stage 4 (severe): Secondary | ICD-10-CM | POA: Diagnosis not present

## 2023-07-23 DIAGNOSIS — E7800 Pure hypercholesterolemia, unspecified: Secondary | ICD-10-CM | POA: Diagnosis not present

## 2023-07-26 DIAGNOSIS — E7800 Pure hypercholesterolemia, unspecified: Secondary | ICD-10-CM | POA: Diagnosis not present

## 2023-07-27 ENCOUNTER — Other Ambulatory Visit: Payer: Self-pay | Admitting: Cardiology

## 2023-07-27 DIAGNOSIS — I480 Paroxysmal atrial fibrillation: Secondary | ICD-10-CM

## 2023-07-30 DIAGNOSIS — Z0000 Encounter for general adult medical examination without abnormal findings: Secondary | ICD-10-CM | POA: Diagnosis not present

## 2023-07-30 DIAGNOSIS — I129 Hypertensive chronic kidney disease with stage 1 through stage 4 chronic kidney disease, or unspecified chronic kidney disease: Secondary | ICD-10-CM | POA: Diagnosis not present

## 2023-07-30 DIAGNOSIS — Z1331 Encounter for screening for depression: Secondary | ICD-10-CM | POA: Diagnosis not present

## 2023-07-30 DIAGNOSIS — I10 Essential (primary) hypertension: Secondary | ICD-10-CM | POA: Diagnosis not present

## 2023-07-30 DIAGNOSIS — R7301 Impaired fasting glucose: Secondary | ICD-10-CM | POA: Diagnosis not present

## 2023-07-30 DIAGNOSIS — Z23 Encounter for immunization: Secondary | ICD-10-CM | POA: Diagnosis not present

## 2023-07-30 DIAGNOSIS — Z1339 Encounter for screening examination for other mental health and behavioral disorders: Secondary | ICD-10-CM | POA: Diagnosis not present

## 2023-08-03 DIAGNOSIS — H401111 Primary open-angle glaucoma, right eye, mild stage: Secondary | ICD-10-CM | POA: Diagnosis not present

## 2023-08-03 DIAGNOSIS — Z961 Presence of intraocular lens: Secondary | ICD-10-CM | POA: Diagnosis not present

## 2023-08-09 NOTE — Progress Notes (Signed)
 HPI: Follow-up coronary artery disease and prior aortic valve replacement.  Patient is status post coronary artery bypass and graft in 1987 and had aortic valve replacement with a bioprosthetic valve in 2006. Patient has known peripheral vascular disease and was evaluated by Dr. Allyson Sabal and medical therapy recommended. Had enterococcal bacteremia April 2020. Treated with antibiotics.  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  Had discitis May 2020. Had TIA May 2020. Transesophageal echocardiogram July 2020 showed normal LV function, mild to moderate mitral regurgitation, bioprosthetic aortic valve with no significant vegetation noted. CTA July 2020 showed 30% stenosis on the left, occluded left vertebral.  Nuclear study July 2020 showed ejection fraction 52%, inferior defect suggestive of scar versus diaphragmatic attenuation but no ischemia. Also with question of atrial fibrillation during previous hospitalization. Monitor October 2020 showed sinus bradycardia, normal sinus rhythm, sinus tachycardia, PACs including one nonconducted and PVCs.  Echocardiogram December 2022 showed ejection fraction 70 to 75%, mild basal septal hypertrophy, grade 1 diastolic dysfunction, mild left atrial enlargement, mild to moderate mitral regurgitation, previous aortic valve replacement with trace aortic insufficiency.  Patient treated for E. coli sepsis December 2022.  Metoprolol decreased to 25 mg daily at that time due to bradycardia.  Echocardiogram at [facility] March 2024 showed normal LV function, mild aortic stenosis with mean gradient 12 mmHg, mild aortic insufficiency.  Since last seen he denies any, chest pain, palpitations or syncope.  Current Outpatient Medications  Medication Sig Dispense Refill   acetaminophen (TYLENOL) 500 MG tablet Take 500-1,000 mg by mouth every 8 (eight) hours as needed for mild pain or headache.     atorvastatin (LIPITOR) 40 MG tablet TAKE 1 TABLET BY MOUTH DAILY 90 tablet 3    Calcium Carbonate (CALCIUM 600 PO) Take 600 mg by mouth daily.     Cholecalciferol (VITAMIN D-3 PO) Take 1 capsule by mouth daily.     ELIQUIS 2.5 MG TABS tablet TAKE 1 TABLET BY MOUTH 2 TIMES DAILY 180 tablet 1   famotidine (PEPCID) 20 MG tablet Take 20 mg by mouth 2 (two) times daily.     folic acid (FOLVITE) 400 MCG tablet Take 400 mcg by mouth daily.     Glucosamine HCl (GLUCOSAMINE PO) Take 1 tablet by mouth daily.     lisinopril (ZESTRIL) 20 MG tablet Take 1 tablet (20 mg total) by mouth daily. 90 tablet 2   metoprolol succinate (TOPROL-XL) 25 MG 24 hr tablet TAKE 1 TABLET BY MOUTH DAILY. TAKE WITH OR IMMEDIATELY FOLLOWING A MEAL 90 tablet 0   Multiple Vitamin (THERAGRAN PO) Take 1 tablet by mouth daily.     saccharomyces boulardii (FLORASTOR) 250 MG capsule Take 1 capsule (250 mg total) by mouth 2 (two) times daily. 28 capsule 0   timolol (TIMOPTIC) 0.5 % ophthalmic solution Place 1 drop into the right eye 2 (two) times daily.   3   vitamin C (ASCORBIC ACID) 500 MG tablet Take 500 mg by mouth daily.     Wheat Dextrin (BENEFIBER) POWD Take 1 packet by mouth 2 (two) times daily.     loperamide (IMODIUM) 2 MG capsule Take 1 capsule (2 mg total) by mouth 4 (four) times daily as needed. (Patient not taking: Reported on 08/22/2023) 90 capsule 3   No current facility-administered medications for this visit.     Past Medical History:  Diagnosis Date   Aortic stenosis    a. 2006 s/p bioprosthetic AVR; b. 12/2018 TEE: EF 60-65%, Nl AoV fxn w/o  dehiscence, regurgitation, perivalvular leak, or abscess.   Bacteremia due to Enterococcus 09/2018   a. ? discitis vs SBE; b. 12/2018 TEE: No evidence of vegetation, nl fxn'ing bioprosthetic AoV.   Cancer [facility])    Carotid arterial disease (HCC)    a. 10/2018 Carotid U/S: 1-39% bilat ICA dzs.   Coronary artery disease    a. 1987 s/p CABG x 5; 05/2014 MV: EF 70%, no significant ischemia.   Discitis of lumbar region    a. 10/2018 MRI L2-3 acute discitis  osteomyelitis.   Endocarditis    a. 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   History of prostatectomy    Hyperlipidemia    Hypertension    Lumbar spinal stenosis    a. 10/2018 MRI: mild L2-3 and mod L3-4 spinal canal stenosis.   PAD (peripheral artery disease) (HCC) 06/06/2017   TIA (transient ischemic attack)    a. 10/2018   Vertebral artery occlusion, left    a. 10/2018 CTA head/neck: Occlusion of prox V4 segment of the nondominant L vertebral artery - unknown chronicity.    Past Surgical History:  Procedure Laterality Date   CARDIAC CATHETERIZATION     EF of 66%   CARDIAC VALVE REPLACEMENT  2006   CORONARY ARTERY BYPASS GRAFT  1987   x5   I & D EXTREMITY Left 01/10/2015   Procedure: DEBRIDEMENT ANKLE PLACEMENT ANTIBIOTIC BEADS AND WOUND VAC;  Surgeon: Nadara Mustard, MD;  Location: [facility] OR;  Service: Orthopedics;  Laterality: Left;   IR LUMBAR DISC ASPIRATION W/IMG GUIDE  11/10/2018   TEE WITHOUT CARDIOVERSION N/A 12/18/2018   Procedure: TRANSESOPHAGEAL ECHOCARDIOGRAM (TEE);  Surgeon: Jake Bathe, MD;  Location: [facility] ENDOSCOPY;  Service: Cardiovascular;  Laterality: N/A;    Social History   Socioeconomic History   Marital status: Married    Spouse name: Not on file   Number of children: Not on file   Years of education: Not on file   Highest education level: Not on file  Occupational History   Not on file  Tobacco Use   Smoking status: Former    Current packs/day: 0.00    Types: Cigarettes    Quit date: 11/18/1955    Years since quitting: 67.8   Smokeless tobacco: Never  Substance and Sexual Activity   Alcohol use: No   Drug use: No   Sexual activity: Not on file  Other Topics Concern   Not on file  Social History Narrative   Not on file   Social Drivers of Health   Financial Resource Strain: Low Risk  (08/25/2022)   Received from [facility], [facility]   Overall Financial Resource  Strain (CARDIA)    Difficulty of Paying Living Expenses: Not hard at all  Food Insecurity: No Food Insecurity (08/25/2022)   Received from [facility], [facility]   Hunger Vital Sign    Worried About Running Out of Food in the Last Year: Never true    Ran Out of Food in the Last Year: Never true  Transportation Needs: No Transportation Needs (08/25/2022)   Received from [facility], [facility]   [facility] - Transportation    Lack of Transportation (Medical): No    Lack of Transportation (Non-Medical): No  Physical Activity: Not on file  Stress: Not on file  Social Connections: Not on file  Intimate Partner Violence: Not on file    Family History  Problem Relation Age  of Onset   Heart disease Father    Heart failure Mother    Heart attack Brother    Congestive Heart Failure Sister    Congestive Heart Failure Sister     ROS: no fevers or chills, productive cough, hemoptysis, dysphasia, odynophagia, melena, hematochezia, dysuria, hematuria, rash, seizure activity, orthopnea, PND, pedal edema, claudication. Remaining systems are negative.  Physical Exam: Well-developed well-nourished in no acute distress.  Skin is warm and dry.  HEENT is normal.  Neck is supple.  Chest is clear to auscultation with normal expansion.  Cardiovascular exam is regular rate and rhythm.  2/6 systolic murmur left sternal border. Abdominal exam nontender or distended. No masses palpated. Extremities show trace edema. neuro grossly intact  EKG Interpretation Date/Time:  Wednesday August 22 2023 10:10:19 EST Ventricular Rate:  67 PR Interval:  156 QRS Duration:  88 QT Interval:  394 QTC Calculation: 416 R Axis:   69  Text Interpretation: Sinus rhythm with frequent Premature ventricular complexes and aberrancy Rightward axis Confirmed by Olga Millers ([id number]) on 08/22/2023 10:15:59 AM    A/P  1 paroxysmal atrial fibrillation-patient remains in sinus rhythm.  Continue beta-blocker and  apixaban at present dose.  Will have most recent hemoglobin and renal function forwarded to [place] from primary care.  2 coronary artery disease-he denies chest pain.  Continue statin.  3 hypertension-patient's blood pressure is elevated.  Add amlodipine 5 mg daily follow-up.  4 hyperlipidemia-continue statin.  Will have him recent lipids and liver forwarded to [place] from primary care.  5 history of enterococcal prosthetic valve endocarditis-this was treated medically previously and he has had no recurrent symptoms.  Given his age we would like to be conservative.  6 peripheral vascular disease-continue statin.   Olga Millers, MD

## 2023-08-15 DIAGNOSIS — M5459 Other low back pain: Secondary | ICD-10-CM | POA: Diagnosis not present

## 2023-08-15 DIAGNOSIS — M6281 Muscle weakness (generalized): Secondary | ICD-10-CM | POA: Diagnosis not present

## 2023-08-15 DIAGNOSIS — R260 Ataxic gait: Secondary | ICD-10-CM | POA: Diagnosis not present

## 2023-08-20 DIAGNOSIS — R260 Ataxic gait: Secondary | ICD-10-CM | POA: Diagnosis not present

## 2023-08-20 DIAGNOSIS — M6281 Muscle weakness (generalized): Secondary | ICD-10-CM | POA: Diagnosis not present

## 2023-08-20 DIAGNOSIS — M5459 Other low back pain: Secondary | ICD-10-CM | POA: Diagnosis not present

## 2023-08-22 ENCOUNTER — Ambulatory Visit: Payer: Medicare Other | Attending: Cardiology | Admitting: Cardiology

## 2023-08-22 ENCOUNTER — Encounter: Payer: Self-pay | Admitting: Cardiology

## 2023-08-22 VITALS — BP 158/78 | HR 67 | Ht 67.0 in | Wt 191.0 lb

## 2023-08-22 DIAGNOSIS — Z952 Presence of prosthetic heart valve: Secondary | ICD-10-CM | POA: Diagnosis not present

## 2023-08-22 DIAGNOSIS — I10 Essential (primary) hypertension: Secondary | ICD-10-CM

## 2023-08-22 DIAGNOSIS — I480 Paroxysmal atrial fibrillation: Secondary | ICD-10-CM

## 2023-08-22 DIAGNOSIS — E785 Hyperlipidemia, unspecified: Secondary | ICD-10-CM

## 2023-08-22 DIAGNOSIS — I25810 Atherosclerosis of coronary artery bypass graft(s) without angina pectoris: Secondary | ICD-10-CM

## 2023-08-22 MED ORDER — AMLODIPINE BESYLATE 5 MG PO TABS: 5.0000 mg | ORAL_TABLET | Freq: Every day | ORAL | 3 refills | Status: AC

## 2023-08-22 NOTE — Patient Instructions (Signed)
Medication Instructions:   START AMLODIPINE 5 MG ONCE DAILY  *If you need a refill on your cardiac medications before your next appointment, please call your pharmacy*   Follow-Up: At [facility], you and your health needs are our priority.  As part of our continuing mission to provide you with exceptional heart care, we have created designated Provider Care Teams.  These Care Teams include your primary Cardiologist (physician) and Advanced Practice Providers (APPs -  Physician Assistants and Nurse Practitioners) who all work together to provide you with the care you need, when you need it.  We recommend signing up for the patient portal called "MyChart".  Sign up information is provided on this After Visit Summary.  MyChart is used to connect with patients for Virtual Visits (Telemedicine).  Patients are able to view lab/test results, encounter notes, upcoming appointments, etc.  Non-urgent messages can be sent to your provider as well.   To learn more about what you can do with MyChart, go to [email or web address].    Your next appointment:   6 month(s)  Provider:   Brian Crenshaw, MD      

## 2023-08-23 DIAGNOSIS — R260 Ataxic gait: Secondary | ICD-10-CM | POA: Diagnosis not present

## 2023-08-23 DIAGNOSIS — M5459 Other low back pain: Secondary | ICD-10-CM | POA: Diagnosis not present

## 2023-08-23 DIAGNOSIS — M6281 Muscle weakness (generalized): Secondary | ICD-10-CM | POA: Diagnosis not present

## 2023-08-27 DIAGNOSIS — M6281 Muscle weakness (generalized): Secondary | ICD-10-CM | POA: Diagnosis not present

## 2023-08-27 DIAGNOSIS — L821 Other seborrheic keratosis: Secondary | ICD-10-CM | POA: Diagnosis not present

## 2023-08-27 DIAGNOSIS — D692 Other nonthrombocytopenic purpura: Secondary | ICD-10-CM | POA: Diagnosis not present

## 2023-08-27 DIAGNOSIS — M5459 Other low back pain: Secondary | ICD-10-CM | POA: Diagnosis not present

## 2023-08-27 DIAGNOSIS — R260 Ataxic gait: Secondary | ICD-10-CM | POA: Diagnosis not present

## 2023-08-27 DIAGNOSIS — D225 Melanocytic nevi of trunk: Secondary | ICD-10-CM | POA: Diagnosis not present

## 2023-08-27 DIAGNOSIS — Z129 Encounter for screening for malignant neoplasm, site unspecified: Secondary | ICD-10-CM | POA: Diagnosis not present

## 2023-10-05 ENCOUNTER — Other Ambulatory Visit: Payer: Self-pay | Admitting: Cardiology

## 2023-10-17 DIAGNOSIS — I129 Hypertensive chronic kidney disease with stage 1 through stage 4 chronic kidney disease, or unspecified chronic kidney disease: Secondary | ICD-10-CM | POA: Diagnosis not present

## 2023-10-17 DIAGNOSIS — N184 Chronic kidney disease, stage 4 (severe): Secondary | ICD-10-CM | POA: Diagnosis not present

## 2023-10-18 DIAGNOSIS — R3915 Urgency of urination: Secondary | ICD-10-CM | POA: Diagnosis not present

## 2023-10-18 DIAGNOSIS — R9721 Rising PSA following treatment for malignant neoplasm of prostate: Secondary | ICD-10-CM | POA: Diagnosis not present

## 2023-11-30 DIAGNOSIS — D6869 Other thrombophilia: Secondary | ICD-10-CM | POA: Diagnosis not present

## 2023-11-30 DIAGNOSIS — Z951 Presence of aortocoronary bypass graft: Secondary | ICD-10-CM | POA: Diagnosis not present

## 2023-12-25 ENCOUNTER — Other Ambulatory Visit: Payer: Self-pay | Admitting: Cardiology

## 2023-12-31 ENCOUNTER — Other Ambulatory Visit: Payer: Self-pay | Admitting: Cardiology

## 2023-12-31 DIAGNOSIS — I480 Paroxysmal atrial fibrillation: Secondary | ICD-10-CM

## 2023-12-31 NOTE — Telephone Encounter (Signed)
 Prescription refill request for Eliquis  received. Indication: Afib  Last office visit:08/22/23 (Crenshaw)  Scr: 0.76 (10/05/23)  Age: 89 Weight: 86.6kg  Per dosing criteria, current dose not appropriate. Will forward to pharmacy team to review.

## 2024-01-01 NOTE — Telephone Encounter (Signed)
 Per Chris Pavero, PharmD dose should be increased to 5mg  BID. Called pt and made him aware. Refill sent.

## 2024-03-17 ENCOUNTER — Other Ambulatory Visit: Payer: Self-pay | Admitting: Cardiology

## 2024-04-03 DIAGNOSIS — Z961 Presence of intraocular lens: Secondary | ICD-10-CM | POA: Diagnosis not present

## 2024-04-03 DIAGNOSIS — H401111 Primary open-angle glaucoma, right eye, mild stage: Secondary | ICD-10-CM | POA: Diagnosis not present

## 2024-04-07 DIAGNOSIS — N184 Chronic kidney disease, stage 4 (severe): Secondary | ICD-10-CM | POA: Diagnosis not present

## 2024-04-21 DIAGNOSIS — R9721 Rising PSA following treatment for malignant neoplasm of prostate: Secondary | ICD-10-CM | POA: Diagnosis not present

## 2024-04-28 DIAGNOSIS — R9721 Rising PSA following treatment for malignant neoplasm of prostate: Secondary | ICD-10-CM | POA: Diagnosis not present

## 2024-04-28 DIAGNOSIS — R350 Frequency of micturition: Secondary | ICD-10-CM | POA: Diagnosis not present

## 2024-04-30 DIAGNOSIS — N184 Chronic kidney disease, stage 4 (severe): Secondary | ICD-10-CM | POA: Diagnosis not present

## 2024-04-30 DIAGNOSIS — I129 Hypertensive chronic kidney disease with stage 1 through stage 4 chronic kidney disease, or unspecified chronic kidney disease: Secondary | ICD-10-CM | POA: Diagnosis not present

## 2024-05-05 DIAGNOSIS — K080 Exfoliation of teeth due to systemic causes: Secondary | ICD-10-CM | POA: Diagnosis not present

## 2024-05-19 NOTE — Progress Notes (Signed)
 HPI: Follow-up coronary artery disease and prior aortic valve replacement.  Patient is status post coronary artery bypass and graft in 1987 and had aortic valve replacement with a bioprosthetic valve in 2006. Patient has known peripheral vascular disease and was evaluated by Dr. Court and medical therapy recommended. Had enterococcal bacteremia April 2020. Treated with antibiotics.  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  Had discitis May 2020. Had TIA May 2020. Transesophageal echocardiogram July 2020 showed normal LV function, mild to moderate mitral regurgitation, bioprosthetic aortic valve with no significant vegetation noted. CTA July 2020 showed 30% stenosis on the left, occluded left vertebral.  Nuclear study July 2020 showed ejection fraction 52%, inferior defect suggestive of scar versus diaphragmatic attenuation but no ischemia. Also with question of atrial fibrillation during previous hospitalization. Monitor October 2020 showed sinus bradycardia, normal sinus rhythm, sinus tachycardia, PACs including one nonconducted and PVCs. Echocardiogram December 2022 showed ejection fraction 70 to 75%, mild basal septal hypertrophy, grade 1 diastolic dysfunction, mild left atrial enlargement, mild to moderate mitral regurgitation, previous aortic valve replacement with trace aortic insufficiency.  Patient treated for E. coli sepsis December 2022.  Metoprolol  decreased to 25 mg daily at that time due to bradycardia. [facility]ocardiogram at [facility] April 2025 showed normal LV function, mild to moderate aortic stenosis, mild aortic insufficiency.  Since last seen patient denies dyspnea, chest pain, palpitations, syncope or bleeding.  Current Outpatient Medications  Medication Sig Dispense Refill   acetaminophen  (TYLENOL ) 500 MG tablet Take 500-1,000 mg by mouth every 8 (eight) hours as needed for mild pain or headache.     amLODipine  (NORVASC ) 5 MG tablet Take 1 tablet (5 mg total) by mouth daily. 90  tablet 3   apixaban  (ELIQUIS ) 5 MG TABS tablet Take 1 tablet (5 mg total) by mouth 2 (two) times daily. 180 tablet 1   atorvastatin  (LIPITOR) 40 MG tablet TAKE 1 TABLET BY MOUTH DAILY 90 tablet 3   Calcium  Carbonate (CALCIUM  600 PO) Take 600 mg by mouth daily.     Cholecalciferol  (VITAMIN D -3 PO) Take 1 capsule by mouth daily.     famotidine  (PEPCID ) 20 MG tablet Take 20 mg by mouth 2 (two) times daily.     folic acid  (FOLVITE ) 400 MCG tablet Take 400 mcg by mouth daily.     Glucosamine HCl (GLUCOSAMINE PO) Take 1 tablet by mouth daily.     lisinopril  (ZESTRIL ) 20 MG tablet TAKE 1 TABLET BY MOUTH DAILY 90 tablet 0   loperamide  (IMODIUM ) 2 MG capsule Take 1 capsule (2 mg total) by mouth 4 (four) times daily as needed. 90 capsule 3   metoprolol  succinate (TOPROL -XL) 25 MG 24 hr tablet TAKE 1 TABLET BY MOUTH DAILY. TAKE WITH OR IMMEDIATELY FOLLOWING A MEAL 90 tablet 3   Multiple Vitamin (THERAGRAN PO) Take 1 tablet by mouth daily.     saccharomyces boulardii (FLORASTOR) 250 MG capsule Take 1 capsule (250 mg total) by mouth 2 (two) times daily. 28 capsule 0   timolol  (TIMOPTIC ) 0.5 % ophthalmic solution Place 1 drop into the right eye 2 (two) times daily.   3   vitamin C  (ASCORBIC ACID ) 500 MG tablet Take 500 mg by mouth daily.     Wheat Dextrin (BENEFIBER) POWD Take 1 packet by mouth 2 (two) times daily.     No current facility-administered medications for this visit.     Past Medical History:  Diagnosis Date   Aortic stenosis    a. 2006 s/p  bioprosthetic AVR; b. 12/2018 TEE: EF 60-65%, Nl AoV fxn w/o dehiscence, regurgitation, perivalvular leak, or abscess.   Bacteremia due to Enterococcus 09/2018   a. ? discitis vs SBE; b. 12/2018 TEE: No evidence of vegetation, nl fxn'ing bioprosthetic AoV.   Cancer [id number])    Carotid arterial disease    a. 10/2018 Carotid U/S: 1-39% bilat ICA dzs.   Coronary artery disease    a. 1987 s/p CABG x 5; 05/2014 MV: EF 70%, no significant ischemia.   Discitis of  lumbar region    a. 10/2018 MRI L2-3 acute discitis osteomyelitis.   Endocarditis    a. 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   History of prostatectomy    Hyperlipidemia    Hypertension    Lumbar spinal stenosis    a. 10/2018 MRI: mild L2-3 and mod L3-4 spinal canal stenosis.   PAD (peripheral artery disease) 06/06/2017   TIA (transient ischemic attack)    a. 10/2018   Vertebral artery occlusion, left    a. 10/2018 CTA head/neck: Occlusion of prox V4 segment of the nondominant L vertebral artery - unknown chronicity.    Past Surgical History:  Procedure Laterality Date   CARDIAC CATHETERIZATION     EF of 66%   CARDIAC VALVE REPLACEMENT  2006   CORONARY ARTERY BYPASS GRAFT  1987   x5   I & D EXTREMITY Left 01/10/2015   Procedure: DEBRIDEMENT ANKLE PLACEMENT ANTIBIOTIC BEADS AND WOUND VAC;  Surgeon: Jerona Harden GAILS, MD;  Location: [facility] OR;  Service: Orthopedics;  Laterality: Left;   IR LUMBAR DISC ASPIRATION W/IMG GUIDE  11/10/2018   TEE WITHOUT CARDIOVERSION N/A 12/18/2018   Procedure: TRANSESOPHAGEAL ECHOCARDIOGRAM (TEE);  Surgeon: Jeffrie Oneil BROCKS, MD;  Location: [facility] ENDOSCOPY;  Service: Cardiovascular;  Laterality: N/A;    Social History   Socioeconomic History   Marital status: Married    Spouse name: Not on file   Number of children: Not on file   Years of education: Not on file   Highest education level: Not on file  Occupational History   Not on file  Tobacco Use   Smoking status: Former    Current packs/day: 0.00    Types: Cigarettes    Quit date: 11/18/1955    Years since quitting: 68.5   Smokeless tobacco: Never  Substance and Sexual Activity   Alcohol  use: No   Drug use: No   Sexual activity: Not on file  Other Topics Concern   Not on file  Social History Narrative   Not on file   Social Drivers of Health   Financial Resource Strain: Low Risk (12/19/2023)   Received from [facility]    Overall Financial Resource Strain (CARDIA)    How hard is it for you to pay for the very basics like food, housing, medical care, and heating?: Not hard at all  Food Insecurity: No Food Insecurity (12/19/2023)   Received from [facility]   Hunger Vital Sign    Within the past 12 months, you worried that your food would run out before you got the money to buy more.: Never true    Within the past 12 months, the food you bought just didn't last and you didn't have money to get more.: Never true  Transportation Needs: No Transportation Needs (12/19/2023)   Received from [facility]    Lack of Transportation (Medical): No    Lack of  Transportation (Non-Medical): No  Physical Activity: Not on file  Stress: Not on file  Social Connections: Not on file  Intimate Partner Violence: Not At Risk (12/19/2023)   Received from [facility]   Humiliation, Afraid, Rape, and Kick questionnaire    Within the last year, have you been afraid of your partner or ex-partner?: No    Within the last year, have you been humiliated or emotionally abused in other ways by your partner or ex-partner?: No    Within the last year, have you been kicked, hit, slapped, or otherwise physically hurt by your partner or ex-partner?: No    Within the last year, have you been raped or forced to have any kind of sexual activity by your partner or ex-partner?: No    Family History  Problem Relation Age of Onset   Heart disease Father    Heart failure Mother    Heart attack Brother    Congestive Heart Failure Sister    Congestive Heart Failure Sister     ROS: no fevers or chills, productive cough, hemoptysis, dysphasia, odynophagia, melena, hematochezia, dysuria, hematuria, rash, seizure activity, orthopnea, PND, pedal edema, claudication. Remaining systems are negative.  Physical Exam: Well-developed well-nourished in no acute distress.  Skin is warm and dry.  HEENT is normal.  Neck is  supple.  Chest is clear to auscultation with normal expansion.  Cardiovascular exam is regular rate and rhythm.  2/6 systolic murmur left sternal border. Abdominal exam nontender or distended. No masses palpated. Extremities show no edema. neuro grossly intact   A/P  1 history of enterococcal prosthetic valve endocarditis-treated medically with no recurrent symptoms.  Most recent echocardiogram showed mild to moderate aortic stenosis and mild aortic insufficiency.  2 coronary artery disease-patient denies chest pain.  Continue medical therapy.  Continue statin.  3 paroxysmal atrial fibrillation-he is in sinus rhythm today on exam.  Continue apixaban  and beta-blocker.  4 hypertension-blood pressure controlled.  Continue present medications.  5 hyperlipidemia-continue statin.  6 peripheral vascular disease-continue statin.  Redell Shallow, MD

## 2024-05-23 ENCOUNTER — Other Ambulatory Visit: Payer: Self-pay | Admitting: Cardiology

## 2024-05-28 ENCOUNTER — Encounter: Payer: Self-pay | Admitting: Cardiology

## 2024-05-28 ENCOUNTER — Ambulatory Visit: Attending: Cardiology | Admitting: Cardiology

## 2024-05-28 VITALS — BP 150/66 | HR 79 | Ht 67.0 in | Wt 189.0 lb

## 2024-05-28 DIAGNOSIS — Z952 Presence of prosthetic heart valve: Secondary | ICD-10-CM | POA: Diagnosis not present

## 2024-05-28 DIAGNOSIS — I480 Paroxysmal atrial fibrillation: Secondary | ICD-10-CM | POA: Diagnosis not present

## 2024-05-28 DIAGNOSIS — E785 Hyperlipidemia, unspecified: Secondary | ICD-10-CM

## 2024-05-28 DIAGNOSIS — I25810 Atherosclerosis of coronary artery bypass graft(s) without angina pectoris: Secondary | ICD-10-CM

## 2024-05-28 DIAGNOSIS — I10 Essential (primary) hypertension: Secondary | ICD-10-CM | POA: Diagnosis not present

## 2024-05-28 MED ORDER — ATORVASTATIN CALCIUM 40 MG PO TABS: 40.0000 mg | ORAL_TABLET | Freq: Every day | ORAL | 3 refills | Status: AC

## 2024-05-28 NOTE — Patient Instructions (Signed)

## 2024-06-06 ENCOUNTER — Other Ambulatory Visit: Payer: Self-pay | Admitting: Cardiology

## 2024-06-13 ENCOUNTER — Other Ambulatory Visit: Payer: Self-pay | Admitting: Cardiology

## 2024-06-13 DIAGNOSIS — I480 Paroxysmal atrial fibrillation: Secondary | ICD-10-CM

## 2024-06-13 NOTE — Telephone Encounter (Signed)
 Prescription refill request for Eliquis  received. Indication:afib Last office visit:12/25 Scr: 0.76  4/25 Age:89 Weight:85.7  kg  Prescription refilled

## 2024-11-26 ENCOUNTER — Ambulatory Visit: Admitting: Cardiology
